# Patient Record
Sex: Male | Born: 1935 | Race: White | Hispanic: No | Marital: Married | Smoking: Former smoker
Health system: Southern US, Community
[De-identification: ages and names within clinical notes are randomized; demographics above are authoritative.]

## PROBLEM LIST (undated history)

## (undated) DIAGNOSIS — I219 Acute myocardial infarction, unspecified: Secondary | ICD-10-CM

## (undated) DIAGNOSIS — Z720 Tobacco use: Secondary | ICD-10-CM

## (undated) DIAGNOSIS — I739 Peripheral vascular disease, unspecified: Secondary | ICD-10-CM

## (undated) DIAGNOSIS — W19XXXA Unspecified fall, initial encounter: Secondary | ICD-10-CM

## (undated) DIAGNOSIS — D649 Anemia, unspecified: Secondary | ICD-10-CM

## (undated) DIAGNOSIS — N183 Chronic kidney disease, stage 3 unspecified: Secondary | ICD-10-CM

## (undated) DIAGNOSIS — I714 Abdominal aortic aneurysm, without rupture, unspecified: Secondary | ICD-10-CM

## (undated) DIAGNOSIS — Z950 Presence of cardiac pacemaker: Secondary | ICD-10-CM

## (undated) DIAGNOSIS — Z22322 Carrier or suspected carrier of Methicillin resistant Staphylococcus aureus: Secondary | ICD-10-CM

## (undated) DIAGNOSIS — G629 Polyneuropathy, unspecified: Secondary | ICD-10-CM

## (undated) DIAGNOSIS — I639 Cerebral infarction, unspecified: Secondary | ICD-10-CM

## (undated) DIAGNOSIS — J449 Chronic obstructive pulmonary disease, unspecified: Secondary | ICD-10-CM

## (undated) DIAGNOSIS — Z9889 Other specified postprocedural states: Secondary | ICD-10-CM

## (undated) DIAGNOSIS — Z951 Presence of aortocoronary bypass graft: Secondary | ICD-10-CM

## (undated) DIAGNOSIS — I6529 Occlusion and stenosis of unspecified carotid artery: Secondary | ICD-10-CM

## (undated) DIAGNOSIS — S72001A Fracture of unspecified part of neck of right femur, initial encounter for closed fracture: Secondary | ICD-10-CM

## (undated) DIAGNOSIS — I2510 Atherosclerotic heart disease of native coronary artery without angina pectoris: Secondary | ICD-10-CM

## (undated) DIAGNOSIS — Z8679 Personal history of other diseases of the circulatory system: Secondary | ICD-10-CM

## (undated) DIAGNOSIS — N189 Chronic kidney disease, unspecified: Secondary | ICD-10-CM

## (undated) DIAGNOSIS — I10 Essential (primary) hypertension: Secondary | ICD-10-CM

## (undated) DIAGNOSIS — E119 Type 2 diabetes mellitus without complications: Secondary | ICD-10-CM

## (undated) DIAGNOSIS — E785 Hyperlipidemia, unspecified: Secondary | ICD-10-CM

## (undated) DIAGNOSIS — I5042 Chronic combined systolic (congestive) and diastolic (congestive) heart failure: Secondary | ICD-10-CM

## (undated) DIAGNOSIS — I442 Atrioventricular block, complete: Secondary | ICD-10-CM

## (undated) DIAGNOSIS — R338 Other retention of urine: Secondary | ICD-10-CM

## (undated) DIAGNOSIS — Y92009 Unspecified place in unspecified non-institutional (private) residence as the place of occurrence of the external cause: Secondary | ICD-10-CM

## (undated) HISTORY — DX: Tobacco use: Z72.0

## (undated) HISTORY — DX: Chronic combined systolic (congestive) and diastolic (congestive) heart failure: I50.42

## (undated) HISTORY — DX: Unspecified fall, initial encounter: W19.XXXA

## (undated) HISTORY — DX: Acute myocardial infarction, unspecified: I21.9

## (undated) HISTORY — DX: Anemia, unspecified: D64.9

## (undated) HISTORY — DX: Polyneuropathy, unspecified: G62.9

## (undated) HISTORY — DX: Abdominal aortic aneurysm, without rupture: I71.4

## (undated) HISTORY — DX: Presence of aortocoronary bypass graft: Z95.1

## (undated) HISTORY — DX: Unspecified place in unspecified non-institutional (private) residence as the place of occurrence of the external cause: Y92.009

## (undated) HISTORY — DX: Personal history of other diseases of the circulatory system: Z86.79

## (undated) HISTORY — DX: Presence of cardiac pacemaker: Z95.0

## (undated) HISTORY — DX: Peripheral vascular disease, unspecified: I73.9

## (undated) HISTORY — PX: JOINT REPLACEMENT: SHX530

## (undated) HISTORY — DX: Cerebral infarction, unspecified: I63.9

## (undated) HISTORY — DX: Atherosclerotic heart disease of native coronary artery without angina pectoris: I25.10

## (undated) HISTORY — DX: Abdominal aortic aneurysm, without rupture, unspecified: I71.40

## (undated) HISTORY — DX: Essential (primary) hypertension: I10

## (undated) HISTORY — PX: INSERT / REPLACE / REMOVE PACEMAKER: SUR710

## (undated) HISTORY — DX: Atrioventricular block, complete: I44.2

## (undated) HISTORY — DX: Chronic kidney disease, unspecified: N18.9

## (undated) HISTORY — DX: Morbid (severe) obesity due to excess calories: E66.01

## (undated) HISTORY — DX: Hyperlipidemia, unspecified: E78.5

## (undated) HISTORY — DX: Occlusion and stenosis of unspecified carotid artery: I65.29

## (undated) HISTORY — DX: Other specified postprocedural states: Z98.890

---
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
## 1975-03-07 HISTORY — PX: LUMBAR DISC SURGERY: SHX700

## 2003-03-07 HISTORY — PX: CORONARY ARTERY BYPASS GRAFT: SHX141

## 2003-11-09 ENCOUNTER — Inpatient Hospital Stay (HOSPITAL_COMMUNITY): Admission: AD | Admit: 2003-11-09 | Discharge: 2003-11-18 | Payer: Self-pay | Admitting: Cardiology

## 2003-11-09 ENCOUNTER — Emergency Department (HOSPITAL_COMMUNITY): Admission: EM | Admit: 2003-11-09 | Discharge: 2003-11-09 | Payer: Self-pay | Admitting: Emergency Medicine

## 2003-12-14 ENCOUNTER — Encounter (HOSPITAL_COMMUNITY): Admission: RE | Admit: 2003-12-14 | Discharge: 2004-03-13 | Payer: Self-pay | Admitting: Cardiology

## 2004-02-11 ENCOUNTER — Ambulatory Visit: Payer: Self-pay | Admitting: Cardiology

## 2004-02-22 ENCOUNTER — Encounter: Admission: RE | Admit: 2004-02-22 | Discharge: 2004-03-22 | Payer: Self-pay | Admitting: Internal Medicine

## 2004-03-28 ENCOUNTER — Ambulatory Visit: Payer: Self-pay | Admitting: Cardiology

## 2004-04-14 ENCOUNTER — Ambulatory Visit: Payer: Self-pay

## 2004-04-25 ENCOUNTER — Ambulatory Visit: Payer: Self-pay | Admitting: Cardiology

## 2004-05-09 ENCOUNTER — Ambulatory Visit: Payer: Self-pay | Admitting: Cardiology

## 2004-05-11 ENCOUNTER — Ambulatory Visit: Payer: Self-pay | Admitting: Cardiology

## 2004-05-20 ENCOUNTER — Ambulatory Visit (HOSPITAL_COMMUNITY): Admission: RE | Admit: 2004-05-20 | Discharge: 2004-05-20 | Payer: Self-pay | Admitting: Cardiology

## 2004-05-24 ENCOUNTER — Ambulatory Visit: Payer: Self-pay | Admitting: Cardiology

## 2004-05-25 ENCOUNTER — Encounter: Admission: RE | Admit: 2004-05-25 | Discharge: 2004-05-25 | Payer: Self-pay | Admitting: Cardiology

## 2005-01-11 ENCOUNTER — Encounter: Admission: RE | Admit: 2005-01-11 | Discharge: 2005-01-11 | Payer: Self-pay | Admitting: Vascular Surgery

## 2005-06-08 ENCOUNTER — Ambulatory Visit: Payer: Self-pay | Admitting: Cardiology

## 2005-07-19 ENCOUNTER — Encounter: Admission: RE | Admit: 2005-07-19 | Discharge: 2005-07-19 | Payer: Self-pay | Admitting: Vascular Surgery

## 2006-08-15 ENCOUNTER — Ambulatory Visit: Payer: Self-pay | Admitting: Vascular Surgery

## 2006-10-04 ENCOUNTER — Encounter: Admission: RE | Admit: 2006-10-04 | Discharge: 2006-10-04 | Payer: Self-pay | Admitting: Internal Medicine

## 2006-12-20 ENCOUNTER — Ambulatory Visit: Payer: Self-pay | Admitting: Cardiology

## 2007-06-20 ENCOUNTER — Ambulatory Visit: Payer: Self-pay | Admitting: Cardiology

## 2007-09-03 ENCOUNTER — Ambulatory Visit: Payer: Self-pay | Admitting: Vascular Surgery

## 2008-03-03 ENCOUNTER — Ambulatory Visit: Payer: Self-pay | Admitting: Vascular Surgery

## 2008-07-27 ENCOUNTER — Ambulatory Visit: Payer: Self-pay | Admitting: Cardiology

## 2008-08-25 ENCOUNTER — Ambulatory Visit: Payer: Self-pay | Admitting: Vascular Surgery

## 2008-09-15 ENCOUNTER — Encounter: Payer: Self-pay | Admitting: Cardiology

## 2008-09-17 ENCOUNTER — Encounter: Admission: RE | Admit: 2008-09-17 | Discharge: 2008-09-17 | Payer: Self-pay | Admitting: Internal Medicine

## 2008-10-01 ENCOUNTER — Encounter: Admission: RE | Admit: 2008-10-01 | Discharge: 2008-10-01 | Payer: Self-pay | Admitting: Internal Medicine

## 2009-04-07 ENCOUNTER — Ambulatory Visit: Payer: Self-pay | Admitting: Vascular Surgery

## 2009-07-05 ENCOUNTER — Encounter: Admission: RE | Admit: 2009-07-05 | Discharge: 2009-07-05 | Payer: Self-pay | Admitting: Neurology

## 2009-07-06 ENCOUNTER — Encounter: Payer: Self-pay | Admitting: Cardiology

## 2009-07-07 ENCOUNTER — Ambulatory Visit: Payer: Self-pay | Admitting: Cardiology

## 2009-07-21 ENCOUNTER — Encounter: Admission: RE | Admit: 2009-07-21 | Discharge: 2009-09-20 | Payer: Self-pay | Admitting: Neurology

## 2009-07-22 ENCOUNTER — Encounter: Admission: RE | Admit: 2009-07-22 | Discharge: 2009-07-22 | Payer: Self-pay | Admitting: Neurology

## 2009-11-04 ENCOUNTER — Ambulatory Visit: Payer: Self-pay | Admitting: Vascular Surgery

## 2010-04-07 NOTE — Assessment & Plan Note (Signed)
Summary: f1y   Visit Type:  Follow-up Referring Provider:  Terrace Arabia   Neurology Primary Provider:  Pearson Grippe, MD  CC:  CAD.  History of Present Illness: The patient is seen for followup of coronary disease.  He has had some problems with his foot that is being evaluated.  He is not having any significant cardiac symptoms.  I saw him last in May, 2010.  He has no chest pain or shortness of breath.  He did undergo CABG in 2005.  Current Medications (verified): 1)  Aspirin 81 Mg Tbec (Aspirin) .... Take Two Tablets By Mouth Daily 2)  Lisinopril 10 Mg Tabs (Lisinopril) .... Take One Tablet By Mouth Daily 3)  Lipitor 40 Mg Tabs (Atorvastatin Calcium) .... Take One Tablet By Mouth Daily. 4)  Carvedilol 25 Mg Tabs (Carvedilol) .... Take One Tablet By Mouth Once Daily 5)  Actos 15 Mg Tabs (Pioglitazone Hcl) .Marland Kitchen.. 1 Tab Once Daily 6)  Detrol La 4 Mg Xr24h-Cap (Tolterodine Tartrate) .Marland Kitchen.. 1 Tab Every Other Day 7)  Chlorathalidone 25mg  .... 1 Tab Every Other Day 8)  Lyrica 75 Mg Caps (Pregabalin) .... Take 1 By Mouth Three Times A Day  Allergies (verified): No Known Drug Allergies  Past History:  Past Medical History: CAD CABG...2005 Normal LV function   at cath  ....no echo data as of 07/05/2009 Hypertension Dyslipidemia CVA..1997.Marland Kitchenaffected right side AAA and aneurysms of infrarenal Ao and iliac arteries....Marland Kitchenfollowed Dr. Cari Caraway Renal artery stenosis....noted at cath  2005  medical Rx Tobacco abuse  Review of Systems       The patient denies fever, chills, headache, sweats, rash, change in vision, change in hearing, chest pain, cough, nausea vomiting, urinary symptoms.  All other systems are reviewed and are negative.  Vital Signs:  Patient profile:   77 year old male Height:      70 inches Weight:      203.75 pounds BMI:     29.34 Pulse rate:   57 / minute BP sitting:   166 / 80  (right arm) Cuff size:   regular  Vitals Entered By: Stanton Kidney, EMT-P (Jul 07, 2009 10:35  AM)  Physical Exam  General:  patient is stable today.  He walks with a cane. Eyes:  no xanthelasma. Neck:  no jugular venous distention.   Lungs:  lungs are clear.  Respiratory effort is not labored. Heart:  cardiac exam reveals an S1 and S2.  There no clicks or significant murmurs. Abdomen:  abdomen is soft. Msk:  no musculoskeletal deformities. Extremities:  no peripheral edema. Skin:  no skin rashes. Psych:  patient is oriented to person time and place.  Affect is normal.   Impression & Recommendations:  Problem # 1:  * CVA..1997.Marland KitchenRIGHT SIDE AFFECTED The patient tells me that his carotid Dopplers are followed by his other physicians.  Problem # 2:  DYSLIPIDEMIA (ICD-272.4)  His updated medication list for this problem includes:    Lipitor 40 Mg Tabs (Atorvastatin calcium) .Marland Kitchen... Take one tablet by mouth daily. Lipids are treated.  Problem # 3:  ESSENTIAL HYPERTENSION, BENIGN (ICD-401.1)  His updated medication list for this problem includes:    Aspirin 81 Mg Tbec (Aspirin) .Marland Kitchen... Take two tablets by mouth daily    Lisinopril 10 Mg Tabs (Lisinopril) .Marland Kitchen... Take one tablet by mouth daily    Carvedilol 25 Mg Tabs (Carvedilol) .Marland Kitchen... Take one tablet by mouth once daily Systolic blood pressure is elevated today.  He only followup with his primary  physician.  Problem # 4:  ABDOMINAL AORTIC ANEURYSM (ICD-441.4) This problem is followed by Dr. Cari Caraway.  It has been stable.  Problem # 5:  CAD (ICD-414.00)  His updated medication list for this problem includes:    Aspirin 81 Mg Tbec (Aspirin) .Marland Kitchen... Take two tablets by mouth daily    Lisinopril 10 Mg Tabs (Lisinopril) .Marland Kitchen... Take one tablet by mouth daily    Carvedilol 25 Mg Tabs (Carvedilol) .Marland Kitchen... Take one tablet by mouth once daily Coronary disease is stable.  EKG is done today and reviewed by me.  There is no change.  I've chosen not to do any further workup at this time.  We'll see him back in one year.  Orders: EKG w/  Interpretation (93000)  Problem # 6:  TOBACCO ABUSE (ICD-305.1) Unfortunately the patient continues to smoke a small amount.  I have counseled him to stop.  Patient Instructions: 1)  Follow up in 1 year

## 2010-04-07 NOTE — Miscellaneous (Signed)
  Clinical Lists Changes  Observations: Added new observation of PAST MED HX: CAD CABG...2005 Normal LV function   at cath  ....no echo data as of 07/05/2009 Hypertension Dyslipidemia CVA..1997.Marland Kitchenaffected right side AAA and aneurysms of infrarenal Ao and iliac arteries....Marland Kitchenfollowed Dr. Cari Caraway Renal artery stenosis....noted at cath  2005  medical Rx (07/06/2009 12:08) Added new observation of PRIMARY MD: Pearson Grippe, MD (07/06/2009 12:08)       Past History:  Past Medical History: CAD CABG...2005 Normal LV function   at cath  ....no echo data as of 07/05/2009 Hypertension Dyslipidemia CVA..1997.Marland Kitchenaffected right side AAA and aneurysms of infrarenal Ao and iliac arteries....Marland Kitchenfollowed Dr. Cari Caraway Renal artery stenosis....noted at cath  2005  medical Rx

## 2010-07-02 ENCOUNTER — Encounter: Payer: Self-pay | Admitting: Cardiology

## 2010-07-05 ENCOUNTER — Encounter: Payer: Self-pay | Admitting: Cardiology

## 2010-07-05 DIAGNOSIS — I714 Abdominal aortic aneurysm, without rupture: Secondary | ICD-10-CM | POA: Insufficient documentation

## 2010-07-05 DIAGNOSIS — E785 Hyperlipidemia, unspecified: Secondary | ICD-10-CM | POA: Insufficient documentation

## 2010-07-05 DIAGNOSIS — R9430 Abnormal result of cardiovascular function study, unspecified: Secondary | ICD-10-CM | POA: Insufficient documentation

## 2010-07-05 DIAGNOSIS — Z951 Presence of aortocoronary bypass graft: Secondary | ICD-10-CM | POA: Insufficient documentation

## 2010-07-05 DIAGNOSIS — I701 Atherosclerosis of renal artery: Secondary | ICD-10-CM | POA: Insufficient documentation

## 2010-07-05 DIAGNOSIS — I10 Essential (primary) hypertension: Secondary | ICD-10-CM | POA: Insufficient documentation

## 2010-07-05 DIAGNOSIS — I639 Cerebral infarction, unspecified: Secondary | ICD-10-CM | POA: Insufficient documentation

## 2010-07-05 DIAGNOSIS — Z720 Tobacco use: Secondary | ICD-10-CM | POA: Insufficient documentation

## 2010-07-06 ENCOUNTER — Ambulatory Visit (INDEPENDENT_AMBULATORY_CARE_PROVIDER_SITE_OTHER): Payer: Medicare Other | Admitting: Cardiology

## 2010-07-06 ENCOUNTER — Ambulatory Visit: Payer: Self-pay | Admitting: Cardiology

## 2010-07-06 ENCOUNTER — Encounter: Payer: Self-pay | Admitting: Cardiology

## 2010-07-06 DIAGNOSIS — I714 Abdominal aortic aneurysm, without rupture, unspecified: Secondary | ICD-10-CM

## 2010-07-06 DIAGNOSIS — I701 Atherosclerosis of renal artery: Secondary | ICD-10-CM

## 2010-07-06 DIAGNOSIS — I2510 Atherosclerotic heart disease of native coronary artery without angina pectoris: Secondary | ICD-10-CM

## 2010-07-06 DIAGNOSIS — F17200 Nicotine dependence, unspecified, uncomplicated: Secondary | ICD-10-CM

## 2010-07-06 DIAGNOSIS — Z720 Tobacco use: Secondary | ICD-10-CM

## 2010-07-06 DIAGNOSIS — E785 Hyperlipidemia, unspecified: Secondary | ICD-10-CM

## 2010-07-06 DIAGNOSIS — G629 Polyneuropathy, unspecified: Secondary | ICD-10-CM

## 2010-07-06 DIAGNOSIS — G589 Mononeuropathy, unspecified: Secondary | ICD-10-CM

## 2010-07-06 NOTE — Assessment & Plan Note (Signed)
Patient continues to smoke one half pack per day.  I have counseled him about this.

## 2010-07-06 NOTE — Assessment & Plan Note (Signed)
Patient's blood pressure is under good control.  There is no need for further evaluation at this time.

## 2010-07-06 NOTE — Progress Notes (Signed)
HPI Patient is seen today for followup coronary artery disease.  I saw him last in May, 2011.  He's not had any chest pain.  He underwent CABG in 2005.  He had normal ventricular function at that time.  He has not had any cardiac testing since that time.  He has peripheral vascular disease.  He has aneurysms of the infrarenal aorta and iliac arteries.  This is followed by Dr. Cari Caraway.  The patient tells me that he has also had a carotid Doppler done through Dr. Adele Dan office.  Unfortunately he continues to smoke.  I have counseled him to stop. Not on File  Current Outpatient Prescriptions  Medication Sig Dispense Refill  . aspirin 81 MG tablet Take 162 mg by mouth daily.        Marland Kitchen atorvastatin (LIPITOR) 40 MG tablet Take 40 mg by mouth daily.        . carvedilol (COREG) 25 MG tablet Take 25 mg by mouth daily.        . chlorthalidone (HYGROTON) 25 MG tablet Take 25 mg by mouth every other day.        . lisinopril (PRINIVIL,ZESTRIL) 10 MG tablet Take 10 mg by mouth daily.        . pioglitazone (ACTOS) 15 MG tablet Take 15 mg by mouth daily.        Marland Kitchen tolterodine (DETROL LA) 4 MG 24 hr capsule Take 4 mg by mouth every other day.        Marland Kitchen DISCONTD: pregabalin (LYRICA) 75 MG capsule Take 75 mg by mouth 3 (three) times daily.          History   Social History  . Marital Status: Married    Spouse Name: N/A    Number of Children: N/A  . Years of Education: N/A   Occupational History  . retired    Social History Main Topics  . Smoking status: Current Everyday Smoker  . Smokeless tobacco: Not on file  . Alcohol Use: Yes     occasionally  . Drug Use: Not on file  . Sexually Active: Not on file   Other Topics Concern  . Not on file   Social History Narrative  . No narrative on file    No family history on file.  Past Medical History  Diagnosis Date  . HTN (hypertension)   . Dyslipidemia   . CVA (cerebral vascular accident)     1997, affected right side  . AAA (abdominal  aortic aneurysm)     Also aneurysms of the infrarenal aorta and iliac arteries.. followed Dr. Edilia Bo  . Renal artery stenosis     Noted, 2005, medical therapy  . Tobacco abuse   . CAD (coronary artery disease)   . Hx of CABG     2005  . Ejection fraction     Normal,, 2005,( no echo data as of May, 201 2)  . Neuropathy     Right foot    Past Surgical History  Procedure Date  . Coronary artery bypass graft 2005    ROS  Patient denies fever, chills, headache, sweats, rash, change in vision, change in hearing, chest pain, cough, nausea vomiting, urinary symptoms.  He has continued problems with the neuropathy in his right foot.  He is taking some over-the-counter medications for this.  He is no longer seeing neurology.  PHYSICAL EXAM Patient is oriented to person time and place.  Affect is normal.  Head is atraumatic. There is no  xanthelasma.  There are no carotid bruits.  There is no jugular venous distention.  Lungs are clear.  Respiratory effort is not labored.  Cardiac exam reveals an S1 and S2.  There are no significant murmurs.  The abdomen is soft.  There is no peripheral edema.  There are no skin rashes.  There no musculoskeletal deformities.  Filed Vitals:   07/06/10 0945  BP: 122/76  Pulse: 62  Height: 5\' 10"  (1.778 m)  Weight: 201 lb (91.173 kg)    EKG  EKG is done today and reviewed by me.  There is left axis.  There are no significant changes.  ASSESSMENT & PLAN

## 2010-07-06 NOTE — Assessment & Plan Note (Signed)
The patient is on medication for his lipids and followed by his primary team.  No further workup.

## 2010-07-06 NOTE — Patient Instructions (Signed)

## 2010-07-06 NOTE — Assessment & Plan Note (Signed)
Coronary disease is stable.  There is no EKG change.  He has no symptoms.  He has not had any studies since his CABG in 2005.  He does not need an exercise test.  I decided to proceed with a 2-D echo to reassess LV function.  This will be arranged and I'll be in touch with him.

## 2010-07-06 NOTE — Assessment & Plan Note (Signed)
The patient is bothered by neuropathy in his right foot.  This does not appear to be vascular in origin.  No further workup.

## 2010-07-06 NOTE — Assessment & Plan Note (Signed)
His peripheral vascular disease is being followed carefully by vascular surgery.  No further workup.

## 2010-07-18 ENCOUNTER — Other Ambulatory Visit (HOSPITAL_COMMUNITY): Payer: Federal, State, Local not specified - PPO | Admitting: Radiology

## 2010-07-19 NOTE — Procedures (Signed)
DUPLEX ULTRASOUND OF ABDOMINAL AORTA   INDICATION:  Follow up AAA.   HISTORY:  Diabetes:  No.  Cardiac:  Yes.  Hypertension:  Yes.  Smoking:  Yes.  Connective Tissue Disorder:  Family History:  No.  Previous Surgery:   DUPLEX EXAM:         AP (cm)                   TRANSVERSE (cm)  Proximal             2.32 cm                   2.72 cm  Mid                  3.05 cm                   3.35 cm  Distal               4.12 cm                   4.41 cm  Right Iliac          2.00 cm  Left Iliac           2.03 cm   PREVIOUS:  Date: 04/07/09  AP:  4.2  TRANSVERSE:  4.3   IMPRESSION:  1. No significant change in the maximum diameter measurements of the      distal aorta when compared with the previous examination.  2. There is a small increase in the maximum diameter of the mid      abdominal aorta as compared to the previous study.  3. Previous mid abdominal aorta 2.4 X 2.8 cm.   ___________________________________________  Di Kindle. Edilia Bo, M.D.   EM/MEDQ  D:  11/04/2009  T:  11/04/2009  Job:  [id number]

## 2010-07-19 NOTE — Procedures (Signed)
DUPLEX ULTRASOUND OF ABDOMINAL AORTA   INDICATION:  Abdominal aortic aneurysm.   HISTORY:  Diabetes:  no  Cardiac:  CABG  Hypertension:  yes  Smoking:  yes  Connective Tissue Disorder:  Family History:  no  Previous Surgery:  No   DUPLEX EXAM:         AP (cm)                   TRANSVERSE (cm)  Proximal             2.5 cm                    2.5 cm  Mid                  2.4 cm                    2.8 cm  Distal               4.2 cm                    4.3 cm  Right Iliac          1.8 cm                    1.8 cm  Left Iliac           1.8 cm                    1.6 cm   PREVIOUS:  Date: 08/25/2008  AP:  4.2  TRANSVERSE:  4.1   IMPRESSION:  No significant change in the maximum diameter measurements  of the distal abdominal aorta when compared to the previous examination.   ___________________________________________  Di Kindle. Edilia Bo, M.D.   CH/MEDQ  D:  04/07/2009  T:  04/08/2009  Job:  [id number]

## 2010-07-19 NOTE — Assessment & Plan Note (Signed)
OFFICE VISIT   [name] [name], [name] [name]  DOB:  08/01/1931                                       08/15/2006  [id number]#:[id number]   I saw the patient in the office today for continued followup of his  abdominal aortic aneurysm and also his peripheral vascular disease, I  last saw him in May of 2007.  Since that time the symptoms in his lower  extremities have remained stable, he experiences pain in both calves  associated with ambulation and relieved with rest.  He has had no rest  pain and no history of non-healing ulcers.  He has had no abdominal or  back pain.   SOCIAL HISTORY:  He does continue to smoke a pack per day of cigarettes.   REVIEW OF SYSTEMS:  He has recently had problems with the shingles.  He  denies any history of chest pain, chest pressure, or palpitations.  He  has had no bronchitis, asthma, or wheezing.   PHYSICAL EXAMINATION:  Blood pressure is 173/91, heart rate is 53.  I do  not detect any carotid bruits.  Lungs:  Clear bilaterally to  auscultation.  Cardiac:  He has a regular rate and rhythm.  Abdomen:  Soft and nontender.  His aneurysm is difficult to palpate.  He has  palpable femoral pulses.  I can not palpate pedal pulses.  He does have  warm, well perfused feet.   Doppler study in our office today shows an ABI of 73% on the right and  60% on the left.  These are relatively stable.  The maximum diameter of  his aneurysm is 4 sonometers and this has not changed significantly over  the last year.   He understands we would generally not consider elective repair of the  aneurysm unless it were 5-1/2 sonometers in maximum diameter.  I will  see him back in 1 year for a followup ultrasound.  With respect to his  peripheral vascular disease he understands the importance of tobacco  cessation.  I have encouraged him to stay as active as possible and we  will do followup ABIs in one year.   [name] [name]. Edilia Bo, M.D.  Electronically  Signed   CSD/MEDQ  D:  08/15/2006  T:  08/15/2006  Job:  [id number]   cc:   Salvadore Farber, MD

## 2010-07-19 NOTE — Assessment & Plan Note (Signed)
[facility]                            CARDIOLOGY OFFICE NOTE   DAVELLE, ANSELMI                      MRN:          [id number]  DATE:12/20/2006                            DOB:          [birth year]    [name] [name] is a very pleasant gentleman. He has known coronary disease.  He had been followed by Dr. Samule Ohm and Dr. Samule Ohm has moved to [facility]  and I am taking over his cardiology care. Mr. Hands has significant  claudication. He has aneurysmal disease of the infrarenal abdominal  aorta and the proximal ileac arteries. On this basis, he cannot be  stented. He is followed carefully by Dr. Cari Caraway. He does have  limiting claudication, but he is still able to play golf while using a  cart, although he has had to cut back to 9 holes at a time. He has an  abdominal aortic aneurysm at 4.1 sonometers. He is not having any chest  pain. He has good LV function.   PAST MEDICAL HISTORY:   ALLERGIES:  No known drug allergies.   MEDICATIONS:  1. Detrol LA.  2. Tylenol.  3. Aspirin.  4. Lisinopril.  5. Simvastatin.  6. Metoprolol.   OTHER MEDICAL PROBLEMS:  See the list below.   REVIEW OF SYSTEMS:  As outlined above. His claudication is the major  problem. He is not having any other significant problems and his review  of systems otherwise is negative.   PHYSICAL EXAMINATION:  VITAL SIGNS:  Weight 201 pounds. Blood pressure  134/90. Pulse is 57.  GENERAL: The patient is oriented to person, time, and place. Affect is  normal.  HEENT:  Reveals no xanthelasma. He has normal extraocular motion.  NECK:  There are no carotid bruits. There is no jugular venous  distension.  LUNGS:  Clear. Respiratory effort is not labored.  CARDIAC:  Reveals an S1 with an S2. There no clicks or significant  murmurs.  ABDOMEN:  His abdomen is soft. He has no masses or bruits.  EXTREMITIES:  He has no significant peripheral edema. When getting onto  the examining  table, he skinned the top of his foot slightly and it is  stable. I placed a Band-Aid on it.   PROBLEMS:  1. Coronary disease. He is not having any symptoms. He is post-CABG in      2005.  2. Normal LV function.  3. Significant peripheral vascular disease with abdominal aortic      aneurysm at 4.1 sonometers and aneurysms affecting the infrarenal      abdominal aorta and the proximal ileac arteries.  4. Hypertension, treated.  5. Dyslipidemia, treated.  6. History of a CVA in the past that affected his right side in 1997      and he is stable with this. He is on appropriate medications. We      would want his LDL in the range of 70.   I will see him back for cardiology follow up in six months.     Luis Abed, MD, [facility]  Electronically Signed    JDK/MedQ  DD: 12/20/2006  DT: 12/21/2006  Job #: [id number]   cc:   Massie Maroon, MD  Di Kindle Edilia Bo, M.D.

## 2010-07-19 NOTE — Assessment & Plan Note (Signed)
OFFICE VISIT   HEMI, CHACKO  DOB:  [birth year]                                       09/03/2007  AVWUJ#:[id number]   I saw the patient in the office today for continued followup of his  abdominal aortic aneurysm and peripheral vascular disease.  I last saw  him in June of 2008 at which time the maximum diameter of his aneurysm  was 4 cm.  This has remained relatively stable over the last year.  He  comes in for a 1-year followup visit.  Since I saw him last, he has had  no abdominal or back pain.  He continues to have claudication in both  lower extremities, mostly in the calves.  His symptoms have remained  relatively stable over the last 6 months.  He has had no rest pain and  no history of nonhealing ulcers.  He has had no abdominal or back pain.   SOCIAL HISTORY:  Unfortunately, he does continue to smoke cigarettes.   REVIEW OF SYSTEMS:  He has had no recent chest pain, chest pressure,  palpitations or arrhythmias.  PULMONARY:  He had no bronchitis, asthma or wheezing.  Review of systems is otherwise documented on the medical history form in  his chart, as are his medications.   PHYSICAL EXAMINATION:  This is a pleasant 77 year old gentleman who  appears his stated age.  His blood pressure is 150/83, heart rate is 50.  I do not detect any carotid bruits.  Lungs are clear bilaterally to  auscultation.  On cardiac exam, he has a regular rate and rhythm.  His  abdomen is soft and nontender.  His aneurysm is nontender.  He has  diminished femoral pulses.  I cannot palpate pedal pulses although both  feet appear adequately perfused without ischemic ulcers.   Ultrasound in our office today shows the maximum diameter of his  aneurysm is 4.3 cm.  Maximum diameter of the right iliac artery is 2.6  cm, and maximum diameter of the left iliac artery is 2.6 cm.  There has,  thus, been a slight increase in size of his abdominal aortic aneurysm.   Doppler  study in our office today shows monophasic Doppler signals in  both feet with an ABI of 75% on the right and 60% on the left.   Given that the aneurysm has enlarged slightly, I have recommend we do an  ultrasound again in 6 months.  If the aneurysm continues to enlarge he  will need a CAT scan to consider elective repair and further assess the  aneurysm.  He understands we generally would not consider elective  repair of the aneurysm unless it reached 5.5 cm in maximum diameter.  We  have again discussed the importance of tobacco cessation.  He  understands that this does increase the risk of aneurysm enlargement and  rupture.  We will plan on seeing him back in 6 months.  He knows to call  sooner if he has problems.   Di Kindle. Edilia Bo, M.D.  Electronically Signed   CSD/MEDQ  D:  09/03/2007  T:  09/04/2007  Job:  [id number]

## 2010-07-19 NOTE — Procedures (Signed)
DUPLEX ULTRASOUND OF ABDOMINAL AORTA   INDICATION:  Follow-up evaluation of known abdominal aortic aneurysm.   HISTORY:  Diabetes:  No.  Cardiac:  Coronary artery bypass graft in 2005.  Hypertension:  Yes.  Smoking:  Yes.  Connective Tissue  Disorder:  Family History:  Previous Surgery:   DUPLEX EXAM:         AP (cm)                   TRANSVERSE (cm)  Proximal             2.5 cm                    2.7 cm  Mid                  4.2 cm                    4.1 cm  Distal               2.5 cm                    2.4 cm  Right Iliac          1.7 cm                    2.2 cm  Left Iliac           2.5 cm                    2.3 cm   PREVIOUS:  Date:  AP:  4.3  TRANSVERSE:  4.1   IMPRESSION:  Abdominal aortic aneurysm measurements are stable compared  to the previous study.   ___________________________________________  [name] [name]. [name] [name], M.D.   MC/MEDQ  D:  08/25/2008  T:  08/25/2008  Job:  [id number]

## 2010-07-19 NOTE — Assessment & Plan Note (Signed)
[facility]                            CARDIOLOGY OFFICE NOTE   [name] [name], [name] [name]                      MRN:          [id number]  DATE:06/20/2007                            DOB:          [birth year]    [name] [name] is doing well.  I have taken over his cardiology care from  Dr. Samule Ohm.  The patient has claudication.  He has an abdominal aortic  aneurysm that is followed carefully by [name] [name].  He does have  coronary disease, he is post CABG in 2005.  He is not having any  significant chest pain, shortness of breath, syncope or presyncope.   PAST MEDICAL HISTORY:   ALLERGIES:  HE HAS NOT RESPONDED WELL TO LATEX, SHELLFISH AND DYE.   MEDICATIONS:  Aspirin, lisinopril, Tylenol, Lipitor and Coreg.   OTHER MEDICAL PROBLEMS:  See the list on my note of December 20, 2006.   REVIEW OF SYSTEMS:  He is really doing well.  Unfortunately, he  continues to smoke.  He says he has cut down.  He is not able to stop at  this point.  Of course, he is encouraged current to stop smoking.  Otherwise the review of systems is negative.   PHYSICAL EXAMINATION:  VITAL SIGNS:  Weight is 207 pounds, which is up 6  pounds.  He definitely needs to lose weight.  Blood pressure 150/80.  Recently, he said that Dr. Elmyra Ricks office it was not elevated.  His pulse  is 81.  GENERAL:  The patient is oriented to person, time and place.  Affect is normal.  HEENT:  Reveals no xanthelasma.  He has normal extraocular motion.  NECK:  There are no carotid bruits.  There is no jugulovenous  distention.  LUNGS:  Clear.  Respiratory effort is not labored.  CARDIAC:  Reveals S1-S2.  There are no clicks or significant murmurs.  ABDOMEN:  Soft.  EXTREMITIES:  He has no peripheral edema.   EKG reveals sinus rhythm was sinus bradycardia.   PROBLEMS:  Problems are listed completely on the note of December 20, 2006 and reviewed.  1. Coronary disease, stable.  2. Peripheral vascular  disease, following with [name] [name]   It is extremely important that he stop smoking.  He knows this.  I will  see him back in 1 year.     [name] Abed, MD, [facility]  Electronically Signed    JDK/MedQ  DD: 06/20/2007  DT: 06/20/2007  Job #: [id number]   cc:   Massie Maroon, MD  Di Kindle Edilia Bo, M.D.

## 2010-07-19 NOTE — Procedures (Signed)
DUPLEX ULTRASOUND OF ABDOMINAL AORTA   INDICATION:  Follow-up evaluation of known abdominal aortic aneurysm.   HISTORY:  Diabetes:  No.  Cardiac:  Coronary artery bypass graft in 2005.  Hypertension:  Yes.  Smoking:  Yes.  Connective Tissue Disorder:  Family History:  Previous Surgery:   DUPLEX EXAM:         AP (cm)                   TRANSVERSE (cm)  Proximal             2.5 cm                    2.9 cm  Mid                  4.3 cm                    4.1 cm  Distal               3.2 cm                    3.0 cm  Right Iliac          1.0 cm                    1.4 cm  Left Iliac           1.5 cm                    1.5 cm   PREVIOUS:  Date: 09/04/2007  AP:  4.3  TRANSVERSE:  4.3   IMPRESSION:  Abdominal aortic aneurysm measurements are stable compared  to previous study.   ___________________________________________  Di Kindle. Edilia Bo, M.D.   MC/MEDQ  D:  03/03/2008  T:  03/03/2008  Job:  [id number]

## 2010-07-22 ENCOUNTER — Encounter: Payer: Self-pay | Admitting: *Deleted

## 2010-07-22 NOTE — Op Note (Signed)
NAME:  [name] [name], [name] [name] NO.:  [id number]   MEDICAL RECORD NO.:  [id number]          PATIENT TYPE:  OIB   LOCATION:  2899                         FACILITY:  [facility]   PHYSICIAN:  Salvadore Farber, M.D. LHCDATE OF BIRTH:  Dec 06, 1931   DATE OF PROCEDURE:  05/20/2004  DATE OF DISCHARGE:                                 OPERATIVE REPORT   PROCEDURE:  Colon abdominal aortography with bilateral lower extremity  runoff to the feet, selective catheterization of the left external iliac  artery via contralateral common femoral arterial access.   INDICATION:  [name] [name] is a 77 year old gentleman with longstanding  claudication of his left leg which had been only mildly lifestyle-limiting.  However, over the past year, he has developed worsening claudication of his  right leg such that he currently has claudication in both calves at less  than 100 yards of walking on level ground.  He finds this quite disabling to  his lifestyle.  We have tried conservative management with exercise therapy  and Cilostazol.  These have not resulted in a satisfactory improvement in  his symptoms.  He is therefore brought to angiography with an I2  percutaneous revascularization.   PROCEDURAL TECHNIQUE:  Informed consent was obtained.  Under 1% lidocaine  local anesthesia and using a Smart needle, a 5 French sheath was placed in  the right common femoral artery.  Wholey wire was advanced into the  abdominal aorta.  Abdominal aortography was performed using a pigtail  catheter positioned in the suprarenal abdominal aorta.  Catheter was then  pulled back to the infrarenal abdominal aorta.  Abdominal aortography with  bilateral lower extremity runoff to the feet was performed.  This  demonstrated at least moderate stenosis in the right common and external  iliac artery as well as the left external iliac.  To further interrogate  this, I began with pressure assessment on the right.  The pigtail  catheter  was exchanged over a wire for a 4 French end-hole catheter.  This was  positioned in the infrarenal abdominal aorta.  Simultaneous pressures were  measured in the abdominal aorta and the distal right external iliac artery.  This demonstrated a resting peak gradient of 20 mmHg.   To assess the severity of the left external iliac stenosis, a LIMA catheter  was used to selectively engage the common iliac.  Wholey wire was advanced  into the proximal SFA.  Over this, the 4 French end-hole catheter was  advanced into the distal left external iliac.  Then 200 mcg of intraarterial  nitroglycerin was administered.  Pull-back was then performed across the  stenoses and back into the abdominal aorta.  This demonstrated a 25 mmHg  inducible gradient.   Images also demonstrated bilateral common iliac aneurysms proximal to the  stenotic regions.  These appeared to be substantial in size.  Procedure was  therefore completed to allow further assessment of the size of the aneurysms  by CT and subsequent planning for revascularization and repair of the  aneurysms if necessary.   The sheath was removed and manual compression applied.  He was then  transferred to the holding room in stable condition having tolerated the  procedure well.   COMPLICATIONS:  None.   FINDINGS:  1.  Abdominal aorta:  Diffuse disease of the abdominal aorta, both above and      below the takeoff of the renal arteries.  There is no evidence of      aneurysm and no significant stenosis.  2.  Renal arteries:  Single vessels bilaterally.  Both have mild ostial      stenoses.  3.  Right leg:  Moderately aneurysmal common iliac at its origin.  The      internal iliac is totally occluded.  The common iliac has a 70%      stenosis.  There is an 80% stenosis of the proximal external iliac.      Common femoral is without significant disease.  The profunda is widely      patent.  The SFA has scattered mild disease throughout  its course      without significant stenosis.  Similar mild plaquing in the popliteal.      The anterior tibial has a very high origin at the level of the knee.      The posterior tibial is occluded in its mid course and does not      collateralize distally.  There is thus two-vessel runoff to the foot via      the peroneal and anterior tibial.  4.  Left leg:  Markedly aneurysmal left common iliac artery.  The internal      iliac has an ostial 90% stenosis.  There is a 60-70% stenosis of the      proximal external iliac.  The common femoral and profunda are without      significant disease.  The SFA is occluded from its mid section with      reconstitution in the popliteal just above the knee.  There are robust      collaterals to the popliteal.  The posterior tibial is occluded      proximally.  The anterior tibial is occluded distally.  There is single-      vessel runoff to the foot via the peroneal.   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  Management depends on the  size of the aneurysms.  We will thus assess this with CT angiogram to be  performed next week.  After this, we will aim at revascularization of the  iliacs.  It is my hope that with relief of the iliac stenoses, he will be  rendered minimally symptomatic despite continued occlusion at the  superficial femoral artery which could then be managed conservatively.      WED/MEDQ  D:  05/20/2004  T:  05/21/2004  Job:  [id number]

## 2010-07-22 NOTE — Procedures (Signed)
DUPLEX ULTRASOUND OF ABDOMINAL AORTA   INDICATION:  Followup, known abdominal aortic aneurysm.   HISTORY:  Diabetes:  No.  Cardiac:  CABG in 2005.  Hypertension:  Yes.  Smoking:  Yes.  Connective Tissue Disorder:  Family History:  Previous Surgery:   DUPLEX EXAM:         AP (cm)                   TRANSVERSE (cm)  Proximal             2.51 cm                   2.46 cm  Mid                  4.30 cm                   4.30 cm  Distal               4.14 cm                   4.45 cm  Right Iliac          2.58 cm                   2.31 cm  Left Iliac           2.60 cm                   2.62 cm   PREVIOUS:  Date:  AP:  3.68  TRANSVERSE:  3.99   IMPRESSION:  1. Abdominal aortic aneurysm noted with the largest measurement of      4.30 X 4.30.  2. Right common iliac aneurysm noted, measuring 2.58 x 2.31.  3. Left common iliac artery aneurysm noted, measuring 2.60 x 2.62.  4. Slightly increasing size compared to previous measurement.   ___________________________________________  Di Kindle. Edilia Bo, M.D.   MG/MEDQ  D:  09/03/2007  T:  09/03/2007  Job:  [id number]

## 2010-07-22 NOTE — H&P (Signed)
NAME:  [name] [name], [name] [name] NO.:  [id number]   MEDICAL RECORD NO.:  [id number]                   PATIENT TYPE:  INP   LOCATION:  2313                                 FACILITY:  [facility]   PHYSICIAN:  Jonelle Sidle, M.D. [facility]        DATE OF BIRTH:  Jul 15, 1931   DATE OF ADMISSION:  11/09/2003  DATE OF DISCHARGE:                                HISTORY & PHYSICAL   PRIMARY CARE PHYSICIAN:  Dr. Lendell Caprice.   CHIEF COMPLAINT:  Chest pain.   HISTORY OF PRESENT ILLNESS:  [name] [name] is a pleasant 77 year old male with  a reported history of peripheral arterial disease, managed medically,  ongoing tobacco use, dyslipidemia, and stroke approximately eight years ago.  He presented to the [facility] today  following an episode of chest discomfort.  He states that he initially had  chest pressure while playing golf last Tuesday, although it lasted only  approximately 10 minutes.  On Saturday, this past weekend, he woke up at  night with recurrent chest pressure that lasted approximately two hours and  ultimately resolved without specific intervention other than aspirin.  He  was apparently seen at Urgent Care at some point over the last few days and  actually was scheduled to see Dr. Lendell Caprice tomorrow.  He, however, went out  to play golf today and got through 12 holes, although had significant chest  pressure with exertion which became more severe, and he ultimately presented  for further assessment.  He did take an aspirin, and by the time he reached  the [facility] Emergency Department he was pain-free.  His  electrocardiogram shows evidence of an anterior wall infarct with some  residual ST elevation in leads V1 through V3, and P-wave inversions, also  associated with decreased R-wave progression and Q-waves in V1 and V2.  His  cardiac markers are abnormal with a CK-MB of 17, and a troponin-I of 0.52.  He was transferred over to the  intensive care unit at [facility]  and is being managed with heparin, Integrilin, aspirin, and nitroglycerin.  He has remained chest pain free since his presentation, and is  hemodynamically stable.   ALLERGIES:  No known drug allergies.  He also denies contrast dye allergy  and iodine allergy.   MEDICATIONS AT HOME:  1.  _____________ 100 mg p.o. daily.  2.  Zocor 40 mg p.o. q.h.s.  3.  Detrol LA 4 mg p.o. daily.  4.  Aspirin 325 mg p.o. daily.  5.  Glucosamine/chondroitin.  6.  At present, he is also on Lopressor 25 mg p.o. b.i.d., heparin and      Integrilin per pharmacy, and intravenous nitroglycerin.   PAST MEDICAL HISTORY:  1.  Dyslipidemia, on Zocor long-term.  He reports reasonable cholesterol      control, although it does not sound like this has been checked recently.      His last physical  was approximately one year ago.  2.  Reported stroke in 1998.  He still has some mild residual right-sided      weakness, but is able to function and plays golf regularly.  3.  History of peripheral arterial disease involving the right lower      extremity.  Apparently, he has some pain in his legs at times and has      seen Dr. Edilia Bo in the past.  He has not undergone any      revascularization procedures and is being managed medically at this      time.  4.  History of back surgery approximately 30 years ago.   SOCIAL HISTORY:  The patient is retired and lives at [facility] with his  wife here in [place].  He drinks alcohol occasionally, and has a 50 pack  year history of tobacco use which is ongoing.   FAMILY HISTORY:  Noncontributory for premature cardiovascular disease.   REVIEW OF SYSTEMS:  As described in present illness.  He states that he has  trouble sleeping in the evenings.  He has some mild stable shortness of  breath with activity.  He has occasional cough and wheezing.  Otherwise,  systems negative.   PHYSICAL EXAMINATION:  VITAL SIGNS:   Temperature is 97.1 degrees, heart rate  is 81, respirations 18, blood pressure is 140/67, oxygen saturation is 98%  on room air.  GENERAL:  This is an elderly well-developed male in no acute distress.  HEENT:  Conjunctivae are normal.  Oropharynx is clear.  NECK:  Supple without elevated jugular venous pressure, carotid bruits, or  thyromegaly.  LUNGS:  Clear with diminished breath sounds, but no active wheezing and non-  labored breathing at rest.  CARDIAC:  Regular rate and rhythm with a very soft S4, but no S3, gallop, or  loud murmur.  No pericardial rub is noted.  ABDOMEN:  Soft with normoactive bowel sounds and no tenderness to palpation.  EXTREMITIES:  No pitting edema.  There are bilateral femoral bruits, right  greater than left.  Peripheral pulses are 1+.  SKIN:  No ulcerative changes are noted.  MUSCULOSKELETAL:  No kyphosis noted.  NEUROLOGIC:  Alert and oriented x3.   LABORATORY DATA:  Chest x-ray is pending at this time.   Additional laboratory data:  WBC is 13.8, hemoglobin 16.3, platelets 318.  Sodium 136, potassium 4.1, BUN 14, creatinine 1.1.  INR 0.8.   IMPRESSION:  1.  Acute coronary syndrome with evidence of anterior injury.  Initial      troponin-I is 0.52, with a CK-MB of 17.  The patient is pain-free at      this point on aspirin, Integrilin, heparin, and nitroglycerin.  He is      hemodynamically stable.  2.  Ongoing tobacco use.  3.  Peripheral arterial disease, managed medically with femoral bruits.  4.  Dyslipidemia, on Zocor, status uncertain at this time.  5.  Hypertension at presentation.  6.  History of cerebrovascular disease, status post stroke in 1998.   PLAN:  1.  The patient has been admitted to the intensive care unit.  We will plan      to continue aspirin, Lopressor, statin therapy, heparin, Integrilin, and      nitroglycerin.  Repeat electrocardiogram shows similar anterior changes     in the absence of symptoms.  2.  Anticipate  diagnostic coronary angiography with an eye towards      percutaneous intervention as first case  in the morning assuming the      patient remains symptom-free and stable.  I reviewed the case with Dr.      Eden Emms who is on call tonight in case the situation changes.  I      discussed the procedure with the patient and his wife, and they are in      agreement to proceed.  3.  Will not institute Plavix at this point until coronary anatomy is      defined.  4.  Follow up fasting lipid profile.  5.  Smoking cessation will be critical.  6.  Further plans to follow.                                                Jonelle Sidle, M.D. [facility]    SGM/MEDQ  D:  11/09/2003  T:  11/09/2003  Job:  [id number]

## 2010-07-22 NOTE — Cardiovascular Report (Signed)
NAME:  [name] [name], [name] [name] NO.:  [id number]   MEDICAL RECORD NO.:  [id number]                   PATIENT TYPE:  INP   LOCATION:  2313                                 FACILITY:  [facility]   PHYSICIAN:  Salvadore Farber, M.D. [facility]         DATE OF BIRTH:  [birth year]   DATE OF PROCEDURE:  11/10/2003  DATE OF DISCHARGE:                              CARDIAC CATHETERIZATION   PROCEDURE:  Left heart catheterization, left ventriculography, coronary  angiography.   INDICATION:  [name] is a 77 year old gentleman who presented yesterday  evening with six days of intermittent chest discomfort occurring both with  at rest and clearly exacerbated by exertion.  Electrocardiogram demonstrated  anterior ST elevations of approximately 1 mm.  Since he was pain free, he  was treated medically initially and referred for diagnostic angiography this  morning.  He is ruled in for a myocardial infarction with CK-MB rising to 62  with total CK of 434.   PROCEDURAL TECHNIQUE:  Informed consent was obtained.  Under 1% lidocaine  local anesthesia, a 6 French sheath was placed in the left common femoral  artery.  Diagnostic angiography was performed using JL-4, JR-4, and pigtail  catheters.  The pigtail catheter was then pulled back to the suprarenal  abdominal aorta.  Abdominal aortography was performed by power injection.  The patient tolerated the procedure well and was transferred to the holding  room in stable condition.  Sheaths will be removed there.   COMPLICATIONS:  None.   FINDINGS:  1.  LV:  120/7/20. EF 67% with apical dyskinesis.  2.  No aortic stenosis or mitral regurgitation.  3.  Left main:  Angiographically normal.  4.  LAD:  Large vessel giving rise to a very large septal perforator which      parallels the LAD intramyocardially.  The LAD gives rise to three      relatively small diagonal branches.  The proximal LAD has an 80%      stenosis overlying the takeoff  of the large septal perforator.  This      perforator has an ostial 70% stenosis.  There is then a 95% stenosis of      the mid LAD followed by an 80% stenosis of the more distal portion of      the mid vessel.  There is TIMI-3 flow to the distal vasculature.  5.  Circumflex:  Moderate size vessel giving rise to a large obtuse      marginal.  The first marginal has a proximal 70% stenosis.  The AV      groove circumflex has a 60% stenosis just after the take off of the      marginal.  6.  RCA:  Large, dominant vessel.  It is occluded proximally. The PLV and      PDA are both collateralized well from the left.   ABDOMINAL AORTOGRAPHY:  Diffusely diseased abdominal aorta without  significant stenosis.  Mild-to-moderate plaquing throughout.  There are  single renal arteries bilaterally.  The right appears normal.  The left has  a 70-80% proximal stenosis.   IMPRESSION/RECOMMENDATIONS:  1.  Severe multivessel coronary disease with preserved left ventricular      systolic function.  Due to the complexity of his LAD disease, I      recommend coronary artery bypass grafting.  Will resume heparin after      sheath removal      and continue eptifibatide up to the time of surgery.  2.  Renal artery stenosis, unilateral with blood pressure controlled on      medical therapy.  Will manage conservatively initially.                                               Salvadore Farber, M.D. [facility]    WED/MEDQ  D:  11/10/2003  T:  11/10/2003  Job:  [id number]   cc:   Janae Bridgeman. Eloise Harman., M.D.  [address]  [place]  [place] [address]  Fax: [phone]

## 2010-07-22 NOTE — Op Note (Signed)
NAME:  [name] [name], LUPPINO                         ACCOUNT NO.:  [id number]   MEDICAL RECORD NO.:  [id number]                   PATIENT TYPE:  INP   LOCATION:  2313                                 FACILITY:  [facility]   PHYSICIAN:  Salvatore Decent. Dorris Fetch, M.D.         DATE OF BIRTH:  07-26-1931   DATE OF PROCEDURE:  11/13/2003  DATE OF DISCHARGE:                                 OPERATIVE REPORT   PREOPERATIVE DIAGNOSIS:  Severe three vessel coronary disease status post  myocardial infarction.   POSTOPERATIVE DIAGNOSIS:  Severe three vessel coronary disease status post  myocardial infarction.   PROCEDURE:  Median sternotomy, extracorporeal circulation, coronary artery  bypass grafting x 4 (left internal mammary artery to central portion of left  anterior descending, saphenous vein graft to anterior segment of left  anterior descending, saphenous vein graft to first obtuse marginal,  saphenous vein graft to distal right coronary), endoscopic vein harvest from  left leg.   SURGEON:  Salvatore Decent. Dorris Fetch, M.D.   ASSISTANT:  Toribio Harbour, N.P.   ANESTHESIA:  General.   FINDINGS:  Bifid LAD system with a septal branch and anterior wall branch,  the septal branch was the better quality of the two vessels, therefore,  mammary was used for that graft.  The vessels did not line up for sequential  mammary grafting.  Distal right fair target.  OM good target.  Good quality  conduits.  Anterior wall and apex with edema secondary to recent infarction.   CLINICAL NOTE:  [name] is a 77 year old gentleman with a history of  hyperlipidemia, stroke, ongoing tobacco abuse, and peripheral vascular  disease.  He presented on September 5 with chest pain.  He had had waxing  and waning chest pain for approximately six days prior to admission.  He did  rule in with a peak CK MB of 62.  He underwent cardiac catheterization on  September 6 which showed apical dyskinesis, but otherwise had good  ventricular function.  He had severe three vessel disease not amenable to  percutaneous intervention.  The patient was referred for coronary artery  bypass grafting.  The indications, risks, benefits, and alternative were  discussed in detail with the patient.  He understood and accepted the risks  and agreed to proceed.   OPERATIVE NOTE:  [name] was brought to the preop holding area on  November 13, 2003.  Lines were placed to monitor arterial, central venous  and pulmonary arterial pressure.  Intravenous antibiotics were administered.  He was taken to the operating room, anesthetized, and intubated.  A Foley  catheter was placed.  The chest, abdomen, and legs were prepped and draped  in the usual fashion.  An incision was made in the medial aspect of the left  leg at the level of the knee.  The greater saphenous vein was identified and  harvested endoscopically from the left thigh.  A small portion of the vein  was harvested with an open incision from the upper calf.  The vein was of  good quality.   Simultaneously, a median sternotomy was performed and the left internal  mammary artery was harvested using standard technique.  The mammary artery  was a good quality conduit.  The patient was fully heparinized prior to  dividing the distal end of the mammary artery, there was excellent flow  through the cut end of the vessel.  The pericardium was opened, the  ascending aorta was inspected and palpated, there was no palpable  atherosclerotic disease.  The aorta was cannulated via concentric 2-0  Ethibond pledgeted purse-string sutures.  A dual stage venous cannula was  placed through a purse-string suture in the right atrial appendage.  Cardiopulmonary bypass was instituted and the patient was cooled to 32  degrees Celsius.  The coronary arteries were inspected and anastomotic sites  were chosen.  Of note, the patient had a bifid LAD system with an anterior  branch which was  diffusely diseased.  There also was a parallel septal  branch that gave off multiple septal perforators and this vessel was of  better quality.  Therefore, the mammary was used for it.  This vessel was  intramyocardial.  The conduits were inspected and cut to length.  A foam pad  was placed in the pericardium to protect the left phrenic nerve.  A  temperature probe was placed in the myocardial septum and a cardioplegic  cannula was placed in the ascending aorta.  The aorta was crossclamped, the  left ventricle was emptied via the aortic root vent, and cardiac arrest was  achieved with a combination of cold antegrade cardioplegia and topical iced  saline.  After achieving a complete diastolic arrest and myocardial surface  temperature of 10 degrees Celsius with 1500 mL cardioplegia, the following  distal anastomoses were performed:   First, a reversed saphenous vein graft was placed end-to-side to the  anterior wall branch or portion of the LAD.  This was a 1.5 mm vessel that  had significant plaque just prior to the anastomosis and a 1.5 mm probe did  pass to the apex.  The anastomosis was performed with a running 7-0 Prolene  suture.  There was excellent flow through the graft, cardioplegia was  administered, and there was good hemostasis.   Next, a reversed saphenous vein graft was placed end-to-side to the first  obtuse marginal branch of the left circumflex.  This was a 2 mm good quality  target.  The vein was of good quality.  The anastomosis was performed with a  running 7-0 Prolene suture.  There was excellent flow through the graft,  cardioplegia was administered, and there was good hemostasis at the  anastomosis.   Next, a reversed saphenous vein graft was placed end-to-side to the distal  right coronary.  This was a fair quality vessel that did have a thick wall but no hemodynamically significant stenoses and a probe passed easily into  the posterior descending origin.  It was  a 1.5 mm lumen.  The vein graft was  anastomosed end-to-side with a running 7-0 Prolene suture.  Cardioplegia was  administered.  There was good flow and good hemostasis.   Next, the left internal mammary artery was brought through a window in the  pericardium, the distal end was spatulated, it was a 2 mm good quality  conduit.  It was anastomosed end-to-side to the septal portion of the LAD.  This was an intramyocardial  vessel, it was 1.8 mm in diameter and of good  quality.  The anastomosis was performed with a running 8-0 Prolene suture.  At the completion of the mammary to the LAD anastomosis, the bulldog clamp  was briefly removed to inspect for hemostasis.  Immediate rapid septal  rewarming was noted, the bulldog clamp was replaced.  The mammary pedicle  was tacked to the epicardial surface of the heart with 6-0 Prolene sutures.   Additional cardioplegia was administered via the vein grafts and the aortic  root.  The vein grafts were cut to length.  The cardioplegia cannula was  removed from the ascending aorta and the proximal vein graft anastomoses  were performed to 4.4 mm punch aortotomies while under Cross clamp.  At the  completion of the final proximal anastomosis, the patient was placed in  steep Trendelenburg position.  The bulldog clamp was again removed from the  mammary artery.  The heart and aortic root were deaired and the aortic Cross  clamp was removed.  The total crossclamp time was 90 minutes.  The patient  was being rewarmed during the proximal anastomoses.   After removing the Cross clamp, the patient was in heart block initially and  then went to slow escape rhythm.  All proximal and distal anastomoses were  inspected for hemostasis.  Epicardial pacing wires were placed on the right  ventricle and right atrium and DDD pacing was initiated.  When the patient  had been warmed to a core temperature of 37 degrees Celsius, he was weaned  from cardiopulmonary bypass  on the first attempt without difficulty.  Total  bypass time was 144 minutes.  The initial cardiac index was low, but with  volume administration and administration of low dose Dopamine drip, the  cardiac index was greater than 2 throughout the remainder of the procedure.  A test dose of protamine was administered and was well tolerated.  The  atrial and aortic cannulae were removed, the remainder of the protamine was  administered without incident.  The chest was irrigated with 1 liter of warm  normal saline containing 1 gram vancomycin.  Hemostasis was achieved.  Left  pleural and two mediastinal chest tubes were placed through separate  subcostal incisions.  The pericardium was not closed.  The sternum was  closed with heavy gauge interrupted stainless steel wires.  The remainder of  the incision was closed in standard fashion.  Subcuticular closure was used for the skin.  All sponge, instrument, and needle counts were correct at the  end of the procedure.  The patient was taken from the operating room to the  surgical intensive care unit intubated and in critical but stable condition.                                               Salvatore Decent Dorris Fetch, M.D.    SCH/MEDQ  D:  11/13/2003  T:  11/13/2003  Job:  [id number]   cc:   Jonelle Sidle, M.D. [facility]   Corinna L. Lendell Caprice, MD

## 2010-07-22 NOTE — Discharge Summary (Signed)
NAME:  [name] [name], [name] [name] NO.:  [id number]   MEDICAL RECORD NO.:  [id number]                   PATIENT TYPE:  INP   LOCATION:  2024                                 FACILITY:  [facility]   PHYSICIAN:  Salvatore Decent. Dorris Fetch, M.D.         DATE OF BIRTH:  Sep 14, 1931   DATE OF ADMISSION:  11/09/2003  DATE OF DISCHARGE:  11/18/2003                                 DISCHARGE SUMMARY   ADMITTING DIAGNOSES:  Acute coronary syndrome.   PAST MEDICAL HISTORY:  1.  Dyslipidemia.  2.  Cerebrovascular accident 54.  Continues to have mild residual right-      sided weakness.  This does not affect his physical functioning as he      plays golf regularly.  3.  Peripheral vascular occlusive disease involving the right lower      extremity managed medically at this time.   PAST SURGICAL HISTORY:  Back surgery approximately 30 years ago.   ALLERGIES:  This patient has no known drug allergies.   DISCHARGE DIAGNOSES:  Three vessel coronary artery disease with unstable  angina status post coronary artery bypass grafting.   BRIEF HISTORY:  [name] [name] is a 77 year old Caucasian man.  He presented to  the [facility] Emergency Department on September 5 following an episode of  chest discomfort.  This occurred while he was on the golf course.  He was  able to play through 12 holes, although he had significant chest pressure  during this time.  He took an aspirin and drove himself to [facility]  Emergency Department.  There he was evaluated by Dr. Nona Dell.  His  impression was that of acute coronary syndrome with evidence of anterior  injury.  His initial troponin I is 0.52 with CK-MB of 17.  His plan is for  admission to the hospital to the intensive care unit and cardiac  catheterization in the next 24 hours.   HOSPITAL COURSE:  On November 09, 2003 [name] [name] was admitted to [facility] in the care of Dr. Nona Dell.  On September 6 he underwent  cardiac  catheterization by Dr. Randa Evens.  This revealed significant  three vessel coronary artery disease with preserved left ventricular  function.  Ejection fraction is estimated to be 67%.  As his lesions were  not amenable to percutaneous intervention.  Cardiac surgery consultation was  requested.  He was evaluated later in the day by Dr. Charlett Lango.  After examination of the patient and review of the available records, Dr.  Dorris Fetch agreed that proceeding with coronary artery bypass grafting was  the preferred treatment choice for this gentleman.  The procedure, risks,  benefits were all discussed with [name] [name].  He agreed to proceed with  surgery.   Preoperative arterial evaluation included carotid duplex scan which revealed  mild to moderate bilateral internal carotid artery stenoses.  Because of his  history of CVA, Dr.  Hendrickson asked Dr. Hart Rochester of [facility] vascular surgery to  evaluate [name] [name].  Dr. Hart Rochester did not feel treatment of his carotid  disease is indicated at this time.  Plan is for follow-up with Dr. Edilia Bo  at the [facility] office in 9-12 months with a carotid duplex scan.   November 13, 2003 [name] [name] underwent the following surgical procedure:  Coronary artery bypass grafting x5.  Grafts placed at time of procedure:  Left internal mammary artery was grafted to the septal branch of the left  anterior descending artery.  Saphenous vein was grafted to the anterior  branch of the left anterior descending artery.  Saphenous vein was grafted  to the diagonal artery.  Saphenous vein was grafted to the obtuse marginal  artery.  Saphenous vein was grafted to the posterior descending artery.  Vein was harvested from the left thigh via endovein harvesting technique.  He tolerated this procedure well, transferring in stable condition to the  SICU.  He remained hemodynamically stable in the immediate postoperative  period.  He was extubated several hours after arrival  in intensive care  unit.  He woke from anesthesia neurologically intact.  He required only  routine care in the intensive care unit and was ready for transfer to unit  2000 on postoperative day one.   Overall, [name] [name] postoperative course has been uneventful.  He has had  some mild upper airway wheezing.  This has improved very much in the last 24  hours with aggressive pulmonary toilet and p.r.n. albuterol treatments.  He  has also been a little slow with his ambulation.  For this reason physical  therapy has been consulted.  They recommend for him use a rolling walker for  assistance while he regains his strength.   Today, September 13, postoperative day four, [name] [name] continues to  progress in recovering from his surgery.  His vital signs remain stable with  blood pressure 130/77.  He is afebrile.  His room air saturation is 100%.  His heart is maintaining normal sinus rhythm, 94 beats per minute.  This  morning's chest x-ray revealed small bilateral effusions.  [name] [name]  reports that his breathing feels much better today.  Does have slightly  decreased breath sounds on the right.  He is tolerating his diet.  Bowel and  bladder functions are within normal limits for him.  His incisions are all  healing well.  He has no lower extremity edema.  He has been ambulating  today with cardiac rehabilitation.  Increased his distance to 330 feet.  His  pain is adequately controlled.  If [name] [name] continues to progress in this  manner it is anticipated he will be ready for discharge in the next 24-48  hours.   CONDITION ON DISCHARGE:  Improved.   DISCHARGE INSTRUCTIONS:   MEDICATIONS:  1.  Enteric-coated aspirin 325 mg daily.  2.  Lopressor 50 mg b.i.d.  3.  Zocor 40 mg daily.  4.  Pletal 100 mg daily.  5.  Detrol LA 4 mg daily.  6.  Lasix 40 mg daily for the next five days.  7.  Potassium chloride 20 mEq daily for the next five days. 8.  For pain management he may have Tylox  one to two p.o. q.4h. p.r.n. for      moderate to severe pain or Tylenol 325 mg one to two p.o. q.4h. for mild      pain.   ACTIVITY:  He has been asked to  refrain from driving or heavy lifting,  pushing, or pulling.  Also instructed to continue his breathing exercises  and daily walking.   DIET:  Should continue to be a low fat, low salt diet.   WOUND CARE:  He may shower with mild soap and water.  If his incisions show  any sign of infection he is to call Dr. Sunday Corn office.   FOLLOWUP:  Home health services will be arranged for routine cardiac surgery  protocol.  He will have an appointment with his [place] cardiologist in  approximately two weeks after discharge.  An appointment will be  arranged prior to discharge.  He will have a chest x-ray taken that day.  He  has an appointment to see Dr. Dorris Fetch back at the [facility] office on Monday,  October 3 at 2:15 in the afternoon.  He will be asked to bring his chest x-  ray from [facility] for Dr. Dorris Fetch to review.      Toribio Harbour, N.P.                  Salvatore Decent Dorris Fetch, M.D.    CTK/MEDQ  D:  11/17/2003  T:  11/17/2003  Job:  [id number]   cc:   Salvadore Farber, M.D. [facility]  [address]  [address]  [place]  [place] [address]   Janae Bridgeman. Eloise Harman., M.D.  [address]  [place]  [place] [address]  Fax: [phone]

## 2010-08-11 ENCOUNTER — Ambulatory Visit (HOSPITAL_COMMUNITY): Payer: Medicare Other | Attending: Cardiology | Admitting: Radiology

## 2010-08-11 DIAGNOSIS — I2510 Atherosclerotic heart disease of native coronary artery without angina pectoris: Secondary | ICD-10-CM

## 2010-08-11 DIAGNOSIS — F17200 Nicotine dependence, unspecified, uncomplicated: Secondary | ICD-10-CM | POA: Insufficient documentation

## 2010-08-11 DIAGNOSIS — E785 Hyperlipidemia, unspecified: Secondary | ICD-10-CM | POA: Insufficient documentation

## 2010-08-11 DIAGNOSIS — I059 Rheumatic mitral valve disease, unspecified: Secondary | ICD-10-CM | POA: Insufficient documentation

## 2010-08-11 DIAGNOSIS — I10 Essential (primary) hypertension: Secondary | ICD-10-CM | POA: Insufficient documentation

## 2010-11-04 ENCOUNTER — Encounter: Payer: Self-pay | Admitting: Vascular Surgery

## 2010-12-08 ENCOUNTER — Other Ambulatory Visit: Payer: Self-pay | Admitting: Lab

## 2010-12-08 DIAGNOSIS — I714 Abdominal aortic aneurysm, without rupture: Secondary | ICD-10-CM

## 2010-12-13 ENCOUNTER — Encounter: Payer: Self-pay | Admitting: Vascular Surgery

## 2010-12-14 ENCOUNTER — Encounter (INDEPENDENT_AMBULATORY_CARE_PROVIDER_SITE_OTHER): Payer: Medicare Other | Admitting: Vascular Surgery

## 2010-12-14 ENCOUNTER — Ambulatory Visit: Payer: Medicare Other | Admitting: Vascular Surgery

## 2010-12-14 DIAGNOSIS — I745 Embolism and thrombosis of iliac artery: Secondary | ICD-10-CM

## 2010-12-14 DIAGNOSIS — I723 Aneurysm of iliac artery: Secondary | ICD-10-CM

## 2010-12-14 DIAGNOSIS — I714 Abdominal aortic aneurysm, without rupture: Secondary | ICD-10-CM

## 2010-12-14 NOTE — Progress Notes (Unsigned)
AAA study performed on 12/14/2010.

## 2010-12-21 NOTE — Procedures (Unsigned)
DUPLEX ULTRASOUND OF ABDOMINAL AORTA  INDICATION:  Follow up abdominal aortic aneurysm.  HISTORY: Diabetes:  No. Cardiac:  CABG, CAD. Hypertension:  Yes. Smoking:  Currently. Connective Tissue Disorder: Family History:  No. Previous Surgery:  No intervention.  DUPLEX EXAM:         AP (cm)                   TRANSVERSE (cm) Proximal             2.52 cm                   2.71 cm Mid                  3.12 cm                   3.18 cm Distal               4.64 cm Right Iliac          2.05 cm                   2.05 cm Left Iliac           2.42 cm                   2.42 cm  PREVIOUS:  Date: 11/04/2009  AP:  4.12  TRANSVERSE:  4.41  IMPRESSION: 1. Infrarenal abdominal aortic aneurysm measuring 4.64 cm in diameter     with intramural thrombus present. 2. Aneurysmal dilatation of the mid aorta measuring 2.1 X 3.1 cm. 3. 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  Right     iliac artery system presents with occlusion from mid common iliac     to mid external iliac artery. 4. Left common iliac artery and internal carotid artery present with     50% to 75% stenosis.  The left external iliac artery presents with     >75% stenosis.  ___________________________________________ Di Kindle. Edilia Bo, M.D.  SH/MEDQ  D:  12/14/2010  T:  12/14/2010  Job:  [id number]

## 2011-01-11 ENCOUNTER — Ambulatory Visit: Payer: Medicare Other | Admitting: Vascular Surgery

## 2011-01-17 ENCOUNTER — Encounter: Payer: Self-pay | Admitting: Vascular Surgery

## 2011-01-18 ENCOUNTER — Encounter: Payer: Self-pay | Admitting: Vascular Surgery

## 2011-01-18 ENCOUNTER — Ambulatory Visit (INDEPENDENT_AMBULATORY_CARE_PROVIDER_SITE_OTHER): Payer: Medicare Other | Admitting: Vascular Surgery

## 2011-01-18 VITALS — BP 173/91 | HR 74 | Resp 12 | Ht 70.0 in | Wt 195.0 lb

## 2011-01-18 DIAGNOSIS — I714 Abdominal aortic aneurysm, without rupture, unspecified: Secondary | ICD-10-CM

## 2011-01-18 NOTE — Progress Notes (Signed)
[facility]  Patient name: [name] [name] MRN: [id number] DOB: [birth year] Sex: male  CC: of abdominal aortic aneurysm and iliac artery occlusive disease  HPI: [name] [name] is a 77 y.o. male I last saw in September of 2009. He most recently had a followup study of his aneurysm one year ago in September of 2011 when the maximum diameter was 4.4 cm. Since I seen him last his only complaint is paresthesias in the right leg which she relates to neuropathy. His activity is very limited and he has only mild claudication. He denies rest pain. He's had no nonhealing ulcers. He denies any abdominal or back pain.  He does have a history of hypertension and hyperlipidemia both of which are stable on his current medications. His also had a previous myocardial infarction but has had no recent cardiac problems.  Past Medical History  Diagnosis Date  . HTN (hypertension)   . Dyslipidemia   . CVA (cerebral vascular accident)     1997, affected right side  . AAA (abdominal aortic aneurysm)     Also aneurysms of the infrarenal aorta and iliac arteries.. followed Dr. Edilia Bo  . Renal artery stenosis     Noted, 2005, medical therapy  . Tobacco abuse   . CAD (coronary artery disease)   . Hx of CABG     2005  . Ejection fraction     Normal,, 2005,( no echo data as of May, 201 2)  . Neuropathy     Right foot  . Myocardial infarction 2005  . Leg pain   . Abdominal pain   . Hyperlipidemia   . Nervousness   . Peripheral vascular disease   . Carotid artery occlusion     History reviewed. No pertinent family history.  SOCIAL HISTORY: History  Substance Use Topics  . Smoking status: Current Everyday Smoker -- 0.5 packs/day for 50 years    Types: Cigarettes  . Smokeless tobacco: Not on file  . Alcohol Use: Yes     occasionally    No Known Allergies  Current Outpatient Prescriptions  Medication Sig Dispense Refill  . aspirin 81 MG tablet Take 162 mg by mouth  daily.        Marland Kitchen atorvastatin (LIPITOR) 40 MG tablet Take 40 mg by mouth daily.        . carvedilol (COREG) 25 MG tablet Take 25 mg by mouth daily.        . chlorthalidone (HYGROTON) 25 MG tablet Take 25 mg by mouth every other day.        . cholecalciferol (VITAMIN D) 1000 UNITS tablet Take 1,000 Units by mouth daily.        Marland Kitchen lisinopril (PRINIVIL,ZESTRIL) 10 MG tablet Take 10 mg by mouth daily.        . pioglitazone (ACTOS) 15 MG tablet Take 15 mg by mouth daily.        Marland Kitchen tolterodine (DETROL LA) 4 MG 24 hr capsule Take 4 mg by mouth every other day.        . traMADol (ULTRAM) 50 MG tablet as needed.       . vitamin B-12 (CYANOCOBALAMIN) 100 MCG tablet Take 50 mcg by mouth daily.          REVIEW OF SYSTEMS: Arly.Keller ] denotes positive finding; [  ] denotes negative finding CARDIOVASCULAR:  [ ]  chest pain   [ ]  chest pressure   [ ]  palpitations   [ ]  orthopnea   [  X ] dyspnea on exertion   Arly.Keller ] claudication   [ ]  rest pain   [ ]  DVT   [ ]  phlebitis PULMONARY:   Arly.Keller ] productive cough in AM   [ ]  asthma   [ ]  wheezing NEUROLOGIC:   [ ]  weakness  Arly.Keller ] paresthesias right foot  [ ]  aphasia  [ ]  amaurosis  [ ]  dizziness HEMATOLOGIC:   [ ]  bleeding problems   [ ]  clotting disorders MUSCULOSKELETAL:  [ ]  joint pain   [ ]  joint swelling [ ]  leg swelling GASTROINTESTINAL: [ ]   blood in stool  [ ]   hematemesis GENITOURINARY:  [ ]   dysuria  [ ]   hematuria PSYCHIATRIC:  [ ]  history of major depression INTEGUMENTARY:  [ ]  rashes  [ ]  ulcers CONSTITUTIONAL:  [ ]  fever   [ ]  chills  PHYSICAL EXAM: Filed Vitals:   01/18/11 0916 01/18/11 0917  BP: 168/91 173/91  Pulse: 72 74  Resp: 18 12  Height: 5\' 10"  (1.778 m)   Weight: 195 lb (88.451 kg)   SpO2: 96%    Body mass index is 27.98 kg/(m^2). GENERAL: The patient is a well-nourished male, in no acute distress. The vital signs are documented above. CARDIOVASCULAR: There is a regular rate and rhythm without significant murmur appreciated. I do not detect  carotid bruits. Cannot palpate femoral pulses. I cannot palpate pedal pulses. However, both feet are warm and well-perfused. He has no significant lower Cherie swelling. PULMONARY: There is good air exchange bilaterally without wheezing or rales. ABDOMEN: Soft and non-tender with normal pitched bowel sounds. His aneurysm is palpable and nontender. MUSCULOSKELETAL: There are no major deformities or cyanosis. NEUROLOGIC: No focal weakness or paresthesias are detected. SKIN: There are no ulcers or rashes noted. PSYCHIATRIC: The patient has a normal affect.  DATA:  I have independently interpreted his duplex scan of his abdominal aortic aneurysm which shows that the maximum diameter is 4.64 cm. This is increased only slightly over the last year. The maximum diameter of the right iliac artery is 2.05 cm. Maximum diameter of the left common iliac artery is 2.42 cm. These measurements likewise are stable.  MEDICAL ISSUES: 1. Again explained that we would not consider elective air his aneurysm unless it reached 5.5 cm in maximum diameter. I have ordered a followup ultrasound in one year now see him back at that time. In addition we have discussed the importance of tobacco cessation and he does understand that continued smoking increases his risk of aneurysm expansion. Currently,  he does not appear to be interested in tobacco cessation and therefore I did not refer her to the tobacco cessation clinic. He states he'll let us know if he changes his mind. 2. Is respect to his bilateral iliac artery occlusive disease I will obtain followup ABIs when he returns in one year. I explained that his symptoms progress we could consider periodic male attack angioplasty although currently feels her symptoms are quite stable.  Royetta Probus S [facility] [place] Office: [phone]

## 2011-02-03 ENCOUNTER — Emergency Department (HOSPITAL_COMMUNITY): Payer: Medicare Other

## 2011-02-03 ENCOUNTER — Emergency Department (HOSPITAL_COMMUNITY)
Admission: EM | Admit: 2011-02-03 | Discharge: 2011-02-03 | Disposition: A | Discharge status: Home / Self Care | Payer: Medicare Other | Attending: Emergency Medicine | Admitting: Emergency Medicine

## 2011-02-03 ENCOUNTER — Encounter (HOSPITAL_COMMUNITY): Payer: Self-pay | Admitting: *Deleted

## 2011-02-03 DIAGNOSIS — R5381 Other malaise: Secondary | ICD-10-CM | POA: Insufficient documentation

## 2011-02-03 DIAGNOSIS — I714 Abdominal aortic aneurysm, without rupture, unspecified: Secondary | ICD-10-CM | POA: Insufficient documentation

## 2011-02-03 DIAGNOSIS — Z951 Presence of aortocoronary bypass graft: Secondary | ICD-10-CM | POA: Insufficient documentation

## 2011-02-03 DIAGNOSIS — G319 Degenerative disease of nervous system, unspecified: Secondary | ICD-10-CM | POA: Insufficient documentation

## 2011-02-03 DIAGNOSIS — I10 Essential (primary) hypertension: Secondary | ICD-10-CM | POA: Insufficient documentation

## 2011-02-03 DIAGNOSIS — I2510 Atherosclerotic heart disease of native coronary artery without angina pectoris: Secondary | ICD-10-CM | POA: Insufficient documentation

## 2011-02-03 DIAGNOSIS — I252 Old myocardial infarction: Secondary | ICD-10-CM | POA: Insufficient documentation

## 2011-02-03 DIAGNOSIS — R531 Weakness: Secondary | ICD-10-CM

## 2011-02-03 DIAGNOSIS — Z8673 Personal history of transient ischemic attack (TIA), and cerebral infarction without residual deficits: Secondary | ICD-10-CM | POA: Insufficient documentation

## 2011-02-03 DIAGNOSIS — E785 Hyperlipidemia, unspecified: Secondary | ICD-10-CM | POA: Insufficient documentation

## 2011-02-03 DIAGNOSIS — F17200 Nicotine dependence, unspecified, uncomplicated: Secondary | ICD-10-CM | POA: Insufficient documentation

## 2011-02-03 DIAGNOSIS — G5790 Unspecified mononeuropathy of unspecified lower limb: Secondary | ICD-10-CM | POA: Insufficient documentation

## 2011-02-03 LAB — POCT I-STAT TROPONIN I: Troponin i, poc: 0.02 ng/mL (ref 0.00–0.08)

## 2011-02-03 LAB — URINALYSIS, ROUTINE W REFLEX MICROSCOPIC
Ketones, ur: NEGATIVE mg/dL
Leukocytes, UA: NEGATIVE
Nitrite: NEGATIVE
Specific Gravity, Urine: 1.015 (ref 1.005–1.030)
pH: 7 (ref 5.0–8.0)

## 2011-02-03 LAB — CBC
Hemoglobin: 13.2 g/dL (ref 13.0–17.0)
MCH: 30.1 pg (ref 26.0–34.0)
MCHC: 32 g/dL (ref 30.0–36.0)
MCV: 94.1 fL (ref 78.0–100.0)
RBC: 4.39 MIL/uL (ref 4.22–5.81)

## 2011-02-03 LAB — COMPREHENSIVE METABOLIC PANEL
Albumin: 3.3 g/dL — ABNORMAL LOW (ref 3.5–5.2)
BUN: 23 mg/dL (ref 6–23)
Calcium: 9.3 mg/dL (ref 8.4–10.5)
Creatinine, Ser: 1.57 mg/dL — ABNORMAL HIGH (ref 0.50–1.35)
GFR calc Af Amer: 47 mL/min — ABNORMAL LOW (ref >90)
Glucose, Bld: 108 mg/dL — ABNORMAL HIGH (ref 70–99)
Total Protein: 6.8 g/dL (ref 6.0–8.3)

## 2011-02-03 LAB — DIFFERENTIAL
Basophils Relative: 1 % (ref 0–1)
Eosinophils Absolute: 0.3 10*3/uL (ref 0.0–0.7)
Eosinophils Relative: 3 % (ref 0–5)
Lymphs Abs: 2.1 10*3/uL (ref 0.7–4.0)
Monocytes Relative: 8 % (ref 3–12)

## 2011-02-03 MED ORDER — SODIUM CHLORIDE 0.9 % IV BOLUS (SEPSIS)
500.0000 mL | Freq: Once | INTRAVENOUS | Status: AC
  Administered 2011-02-03: 500 mL via INTRAVENOUS

## 2011-02-03 NOTE — ED Provider Notes (Signed)
History     CSN: [id number] Arrival date & time: 02/03/2011 10:29 AM   First MD Initiated Contact with Patient 02/03/11 1053     11:37am HPI Patient reports waking up at 6:30 this morning. States he drank his coffee-like normal and a small slice of pumping and time. Reports that after her began having generalized weakness and had to stumble to his bed. States since then weakness has improved. Reports a significant history of a CVA and an MI. Denies any upper respiratory infection symptoms, headache, chest pain, shortness breath, numbness, tingling, neck pain, change in vision, change in speech, change in ambulation. Patient is a 77 y.o. male presenting with weakness. The history is provided by the patient.  Weakness Primary symptoms do not include headaches, loss of consciousness, altered mental status, seizures, dizziness, paresthesias, focal weakness, loss of sensation, speech change, fever, nausea or vomiting. The symptoms began 2 to 6 hours ago. The symptoms are improving. The neurological symptoms are diffuse.  Additional symptoms include weakness. Additional symptoms do not include neck stiffness, pain, lower back pain, loss of balance, photophobia, aura, hallucinations, nystagmus, hearing loss, tinnitus, vertigo or anxiety. Medical issues also include cerebral vascular accident and hypertension. Medical issues do not include diabetes.    Past Medical History  Diagnosis Date  . HTN (hypertension)   . Dyslipidemia   . CVA (cerebral vascular accident)     1997, affected right side  . AAA (abdominal aortic aneurysm)     Also aneurysms of the infrarenal aorta and iliac arteries.. followed Dr. Edilia Bo  . Renal artery stenosis     Noted, 2005, medical therapy  . Tobacco abuse   . CAD (coronary artery disease)   . Hx of CABG     2005  . Ejection fraction     Normal,, 2005,( no echo data as of May, 201 2)  . Neuropathy     Right foot  . Myocardial infarction 2005  . Leg pain   .  Abdominal pain   . Hyperlipidemia   . Nervousness   . Peripheral vascular disease   . Carotid artery occlusion     Past Surgical History  Procedure Date  . Coronary artery bypass graft 2005    x4  . Spine surgery 1977    Lumbar HNP surgery    No family history on file.  History  Substance Use Topics  . Smoking status: Current Everyday Smoker -- 0.5 packs/day for 50 years    Types: Cigarettes  . Smokeless tobacco: Not on file  . Alcohol Use: Yes     occasionally      Review of Systems  Constitutional: Negative for fever and diaphoresis.  HENT: Negative for hearing loss, neck stiffness and tinnitus.   Eyes: Negative for photophobia.  Respiratory: Negative for shortness of breath.   Cardiovascular: Negative for chest pain.  Gastrointestinal: Negative for nausea, vomiting and abdominal pain.  Genitourinary: Negative for dysuria and hematuria.  Musculoskeletal: Negative for back pain.  Neurological: Positive for weakness. Negative for dizziness, vertigo, speech change, focal weakness, seizures, loss of consciousness, syncope, facial asymmetry, speech difficulty, light-headedness, numbness, headaches, paresthesias and loss of balance.  Psychiatric/Behavioral: Negative for hallucinations and altered mental status.  All other systems reviewed and are negative.    Allergies  Review of patient's allergies indicates no known allergies.  Home Medications   Current Outpatient Rx  Name Route Sig Dispense Refill  . ASPIRIN EC 81 MG PO TBEC Oral Take 162 mg by mouth daily.      Marland Kitchen  ATORVASTATIN CALCIUM 40 MG PO TABS Oral Take 40 mg by mouth daily.      Marland Kitchen CARVEDILOL 25 MG PO TABS Oral Take 25 mg by mouth daily.      . CHLORTHALIDONE 25 MG PO TABS Oral Take 25 mg by mouth every other day.      Marland Kitchen VITAMIN D 1000 UNITS PO TABS Oral Take 1,000 Units by mouth daily.      Marland Kitchen LISINOPRIL 10 MG PO TABS Oral Take 10 mg by mouth daily.      Marland Kitchen PIOGLITAZONE HCL 15 MG PO TABS Oral Take 15 mg by  mouth daily.      . TOLTERODINE TARTRATE 4 MG PO CP24 Oral Take 4 mg by mouth every other day.      . TRAMADOL HCL 50 MG PO TABS Oral Take 50 mg by mouth every 6 (six) hours as needed. For pain    . VITAMIN B-12 100 MCG PO TABS Oral Take 50 mcg by mouth daily.      Marland Kitchen ZOLPIDEM TARTRATE 10 MG PO TABS Oral Take 10 mg by mouth at bedtime.        BP 116/62  Pulse 59  Temp(Src) 97.8 F (36.6 C) (Oral)  Resp 20  Ht 5\' 10"  (1.778 m)  Wt 195 lb (88.451 kg)  BMI 27.98 kg/m2  SpO2 98%  Physical Exam  Vitals reviewed. Constitutional: He is oriented to person, place, and time. He appears well-developed and well-nourished.  HENT:  Head: Normocephalic and atraumatic.  Right Ear: Tympanic membrane, external ear and ear canal normal.  Left Ear: Tympanic membrane, external ear and ear canal normal.  Nose: Nose normal.  Mouth/Throat: Uvula is midline, oropharynx is clear and moist and mucous membranes are normal.  Eyes: Conjunctivae are normal. Pupils are equal, round, and reactive to light.  Neck: Normal range of motion. Neck supple. No tracheal tenderness, no spinous process tenderness and no muscular tenderness present. No rigidity.  Cardiovascular: Normal rate, regular rhythm and normal heart sounds.  Exam reveals no gallop and no friction rub.   No murmur heard. Pulmonary/Chest: Effort normal and breath sounds normal. He has no wheezes. He has no rales. He exhibits no tenderness.  Abdominal: Soft. Bowel sounds are normal. He exhibits no distension and no mass. There is no tenderness. There is no rebound and no guarding.  Neurological: He is alert and oriented to person, place, and time. No cranial nerve deficit or sensory deficit. Coordination normal. GCS eye subscore is 4. GCS verbal subscore is 5. GCS motor subscore is 6.  Skin: Skin is warm and dry. No rash noted. No erythema. No pallor.  Psychiatric: He has a normal mood and affect. His behavior is normal.    ED Course  Procedures    Results for orders placed during the hospital encounter of 02/03/11  CBC      Component Value Range   WBC 9.1  4.0 - 10.5 (K/uL)   RBC 4.39  4.22 - 5.81 (MIL/uL)   Hemoglobin 13.2  13.0 - 17.0 (g/dL)   HCT 16.1  09.6 - 04.5 (%)   MCV 94.1  78.0 - 100.0 (fL)   MCH 30.1  26.0 - 34.0 (pg)   MCHC 32.0  30.0 - 36.0 (g/dL)   RDW 40.9  81.1 - 91.4 (%)   Platelets 274  150 - 400 (K/uL)  DIFFERENTIAL      Component Value Range   Neutrophils Relative 66  43 - 77 (%)  Neutro Abs 6.1  1.7 - 7.7 (K/uL)   Lymphocytes Relative 23  12 - 46 (%)   Lymphs Abs 2.1  0.7 - 4.0 (K/uL)   Monocytes Relative 8  3 - 12 (%)   Monocytes Absolute 0.7  0.1 - 1.0 (K/uL)   Eosinophils Relative 3  0 - 5 (%)   Eosinophils Absolute 0.3  0.0 - 0.7 (K/uL)   Basophils Relative 1  0 - 1 (%)   Basophils Absolute 0.1  0.0 - 0.1 (K/uL)  COMPREHENSIVE METABOLIC PANEL      Component Value Range   Sodium 138  135 - 145 (mEq/L)   Potassium 4.5  3.5 - 5.1 (mEq/L)   Chloride 103  96 - 112 (mEq/L)   CO2 31  19 - 32 (mEq/L)   Glucose, Bld 108 (*) 70 - 99 (mg/dL)   BUN 23  6 - 23 (mg/dL)   Creatinine, Ser 1.19 (*) 0.50 - 1.35 (mg/dL)   Calcium 9.3  8.4 - 14.7 (mg/dL)   Total Protein 6.8  6.0 - 8.3 (g/dL)   Albumin 3.3 (*) 3.5 - 5.2 (g/dL)   AST 23  0 - 37 (U/L)   ALT 16  0 - 53 (U/L)   Alkaline Phosphatase 79  39 - 117 (U/L)   Total Bilirubin 0.4  0.3 - 1.2 (mg/dL)   GFR calc non Af Amer 41 (*) >90 (mL/min)   GFR calc Af Amer 47 (*) >90 (mL/min)  URINALYSIS, ROUTINE W REFLEX MICROSCOPIC      Component Value Range   Color, Urine YELLOW  YELLOW    APPearance CLEAR  CLEAR    Specific Gravity, Urine 1.015  1.005 - 1.030    pH 7.0  5.0 - 8.0    Glucose, UA NEGATIVE  NEGATIVE (mg/dL)   Hgb urine dipstick NEGATIVE  NEGATIVE    Bilirubin Urine NEGATIVE  NEGATIVE    Ketones, ur NEGATIVE  NEGATIVE (mg/dL)   Protein, ur NEGATIVE  NEGATIVE (mg/dL)   Urobilinogen, UA 1.0  0.0 - 1.0 (mg/dL)   Nitrite NEGATIVE  NEGATIVE     Leukocytes, UA NEGATIVE  NEGATIVE   POCT I-STAT TROPONIN I      Component Value Range   Troponin i, poc 0.02  0.00 - 0.08 (ng/mL)   Comment 3            Dg Chest 2 View  02/03/2011  *RADIOLOGY REPORT*  Clinical Data: Weakness, shortness of breath, chest pressure, vomiting  CHEST - 2 VIEW  Comparison: Chest x-ray of 05/20/2004  Findings: The lungs are clear and slightly hyperaerated. Cardiomegaly is stable.  Median sternotomy sutures are noted from prior CABG.  No acute bony abnormality is seen.  IMPRESSION: Cardiomegaly.  No active lung disease.  No change in hyperaeration.  Original Report Authenticated By: Juline Patch, M.D.   Ct Head Wo Contrast  02/03/2011  *RADIOLOGY REPORT*  Clinical Data: Weakness and nausea.  Vomiting.  History of myeloma.  CT HEAD WITHOUT CONTRAST  Technique:  Contiguous axial images were obtained from the base of the skull through the vertex without contrast.  Comparison: None.  Findings: Moderate generalized atrophy is present.  There are remote lacunar infarcts involving the lentiform nucleus on the right and caudate head and thalamus on the left.  The basal ganglia are otherwise intact.  Moderate confluent periventricular and subcortical white matter hypoattenuation is evident bilaterally. The gray-white differentiation is intact.  The paranasal sinuses and mastoid air cells are clear. Atherosclerotic  calcifications are evident in the cavernous carotid arteries and at the dural margins of the vertebral arteries bilaterally.  IMPRESSION:  1.  Atrophy and moderate generalized white matter disease.  This likely reflects the sequelae of chronic microvascular ischemia. 2.  Remote lacunar infarcts of the basal ganglia bilaterally. 3.  No acute intracranial abnormality.  Original Report Authenticated By: Jamesetta Orleans. MATTERN, M.D.   ED ECG REPORT   Date: 02/03/2011  EKG Time: 11:52am  Rate: 54  Rhythm: Sinus rhythm  Axis: Left axis deviation  Intervals:first-degree A-V  block   ST&T Change: T wave flattening.  Narrative Interpretation: changed, first degree AV block. Advised followup with primary care physician and/or Dr. Myrtis Ser.            MDM   3:30 PM Patient is feeling much better. Blood pressure within normal range. Labs and imaging normal. Will DC home but advised followup with primary care physician Dr. Pearson Grippe early next week for reassessment. Strongly advised immediate return to emergency department for worsening symptoms. Family and patient agree on plan and are ready for discharge.   Medical screening examination/treatment/procedure(s) were conducted as a shared visit with non-physician practitioner(s) and myself.  I personally evaluated the patient during the encounter Pt is a pleasant elderly man who felt weak after eating breakfast and taking his morning medicines.  He has a prior history of coronary artery disease with MI, prior stroke, hypertension, known AAA and known peripheral vascular disease.  Exam today shows him awake and alert.  Heart and lungs are good.  Abdomen soft and nontender.  He is neurologically intact.  His lab workup showed a mild renal insufficiency.  He seems to have recovered from his episode of weakness.  Released. Osvaldo Human, M.D.      [place], [place] 02/03/11 1539  Carleene Cooper III, MD 02/03/11 2014

## 2011-02-03 NOTE — ED Notes (Signed)
MD at bedside.Dr Davidson 

## 2011-02-03 NOTE — Progress Notes (Signed)
Visited with family (wife Harriett Sine and friends) and patient. Patient very weak. Family lost their son this week after lengthy cancer illness. Erasmo Score is tomorrow at [employer]. Support; presence; prayer.  02/03/11 1300  Clinical Encounter Type  Visited With Patient and family together  Visit Type Spiritual support  Referral From Chaplain  Recommendations Follow up  Spiritual Encounters  Spiritual Needs Prayer;Emotional;Other (Comment) (Presence)  Stress Factors  Patient Stress Factors Loss  Family Stress Factors Loss

## 2011-02-03 NOTE — ED Notes (Signed)
Awakened this a.m. With extreme weakness---per EMS B/P on scene 100/58

## 2011-02-03 NOTE — ED Notes (Signed)
YNW:[id number]<OZ> Expected date:02/03/11<BR> Expected time:10:01 AM<BR> Means of arrival:Ambulance<BR> Comments:<BR>

## 2011-02-03 NOTE — ED Notes (Signed)
Secondary Assessment: pt woke up this morning, had a cup of coffee and a piece of pumpkin pie at 0630 and felt sudden weakness at 0830. Reports weakness as generalized with no nausea, vomiting, dizziness. No diapheresis noted.  Denied any visual problem, denied pain, headache, BP 116/62 in room, pt reports this is a low BP for him. Pt took all his morning meds this morning. Pt alert, oriented, neurologically intact. Airway intact. Breath sound clear. HR 57 Regular

## 2011-04-11 DIAGNOSIS — K130 Diseases of lips: Secondary | ICD-10-CM | POA: Diagnosis not present

## 2011-05-18 DIAGNOSIS — D485 Neoplasm of uncertain behavior of skin: Secondary | ICD-10-CM | POA: Diagnosis not present

## 2011-05-18 DIAGNOSIS — K130 Diseases of lips: Secondary | ICD-10-CM | POA: Diagnosis not present

## 2011-07-10 ENCOUNTER — Encounter: Payer: Self-pay | Admitting: Cardiology

## 2011-07-12 ENCOUNTER — Ambulatory Visit: Payer: Medicare Other | Admitting: Cardiology

## 2011-07-13 ENCOUNTER — Ambulatory Visit: Payer: Medicare Other | Admitting: Cardiology

## 2011-08-18 ENCOUNTER — Encounter: Payer: Self-pay | Admitting: Cardiology

## 2011-08-21 ENCOUNTER — Encounter: Payer: Self-pay | Admitting: Cardiology

## 2011-08-21 ENCOUNTER — Ambulatory Visit (INDEPENDENT_AMBULATORY_CARE_PROVIDER_SITE_OTHER): Payer: Medicare Other | Admitting: Cardiology

## 2011-08-21 VITALS — BP 140/84 | HR 92 | Resp 16 | Ht 70.0 in | Wt 201.8 lb

## 2011-08-21 DIAGNOSIS — I714 Abdominal aortic aneurysm, without rupture, unspecified: Secondary | ICD-10-CM

## 2011-08-21 DIAGNOSIS — I10 Essential (primary) hypertension: Secondary | ICD-10-CM | POA: Diagnosis not present

## 2011-08-21 DIAGNOSIS — G589 Mononeuropathy, unspecified: Secondary | ICD-10-CM

## 2011-08-21 DIAGNOSIS — E785 Hyperlipidemia, unspecified: Secondary | ICD-10-CM | POA: Diagnosis not present

## 2011-08-21 DIAGNOSIS — I2510 Atherosclerotic heart disease of native coronary artery without angina pectoris: Secondary | ICD-10-CM | POA: Diagnosis not present

## 2011-08-21 DIAGNOSIS — G629 Polyneuropathy, unspecified: Secondary | ICD-10-CM

## 2011-08-21 NOTE — Assessment & Plan Note (Signed)
The patient's lipids are being treated appropriately.

## 2011-08-21 NOTE — Patient Instructions (Addendum)
Your physician wants you to follow-up in: 12 months.  You will receive a reminder letter in the mail two months in advance. If you don't receive a letter, please call our office to schedule the follow-up appointment.  Your physician has requested that you have a lexiscan myoview. For further information please visit [email or web address] Please follow instruction sheet, as given.  We will call you with your test results.

## 2011-08-21 NOTE — Assessment & Plan Note (Signed)
Blood pressure is controlled. No change in therapy. 

## 2011-08-21 NOTE — Progress Notes (Signed)
HPI  The patient is seen for cardiology followup. I saw him last in May, 2012. He is limited by his peripheral neuropathy. However he's not having any chest pain or shortness of breath. His original symptom in 2005 before his CABG, was a vague sensation of nausea and indigestion. He did not have any classic chest pain. Over time he is done well. Last year I decided to obtain a 2-D echo. This was done in June, 2012. The EF was 65-70%. There were no major valvular abnormalities.  Patient returns and he is stable. He did have one episode of marked dizziness several months ago. It is possible that he took access blood pressure medications by mistake. This resolved and he has been stable since.  No Known Allergies  Current Outpatient Prescriptions  Medication Sig Dispense Refill  . aspirin EC 81 MG tablet Take 162 mg by mouth daily.        Marland Kitchen atorvastatin (LIPITOR) 40 MG tablet Take 40 mg by mouth daily.        . carvedilol (COREG) 25 MG tablet Take 25 mg by mouth daily.        . chlorthalidone (HYGROTON) 25 MG tablet Take 25 mg by mouth every other day.        . lisinopril (PRINIVIL,ZESTRIL) 10 MG tablet Take 10 mg by mouth daily.        . pioglitazone (ACTOS) 15 MG tablet Take 15 mg by mouth daily.        Marland Kitchen tolterodine (DETROL LA) 4 MG 24 hr capsule Take 4 mg by mouth every other day.        . traMADol (ULTRAM) 50 MG tablet Take 50 mg by mouth every 6 (six) hours as needed. For pain      . zolpidem (AMBIEN) 10 MG tablet Take 10 mg by mouth at bedtime.        . cholecalciferol (VITAMIN D) 1000 UNITS tablet Take 1,000 Units by mouth daily.        . vitamin B-12 (CYANOCOBALAMIN) 100 MCG tablet Take 50 mcg by mouth daily.          History   Social History  . Marital Status: Married    Spouse Name: N/A    Number of Children: N/A  . Years of Education: N/A   Occupational History  . retired    Social History Main Topics  . Smoking status: Current Everyday Smoker -- 0.5 packs/day for 50  years    Types: Cigarettes  . Smokeless tobacco: Not on file  . Alcohol Use: Yes     occasionally  . Drug Use: No  . Sexually Active: Not on file   Other Topics Concern  . Not on file   Social History Narrative  . No narrative on file    No family history on file.  Past Medical History  Diagnosis Date  . HTN (hypertension)   . Dyslipidemia   . CVA (cerebral vascular accident)     1997, affected right side  . AAA (abdominal aortic aneurysm)     Also aneurysms of the infrarenal aorta and iliac arteries.. followed Dr. Edilia Bo  . Renal artery stenosis     Noted, 2005, medical therapy  . Tobacco abuse   . CAD (coronary artery disease)   . Hx of CABG     2005  . Ejection fraction     Normal,, 2005,( no echo data as of May, 201 2)  . Neuropathy  Right foot  . Myocardial infarction 2005  . Leg pain   . Abdominal pain   . Hyperlipidemia   . Nervousness   . Peripheral vascular disease   . Carotid artery occlusion     Past Surgical History  Procedure Date  . Coronary artery bypass graft 2005    x4  . Spine surgery 1977    Lumbar HNP surgery    ROS   Patient denies fever, chills, headache, sweats, rash, change in vision, change in hearing, chest pain, cough, nausea vomiting, urinary symptoms. All other systems are reviewed and are negative.  PHYSICAL EXAM   Patient is oriented to person time and place. Affect is normal. There is no jugulovenous distention. There no carotid bruits. Lungs are clear. Respiratory effort is nonlabored. Cardiac exam reveals S1 and S2. There no clicks or significant murmurs. The abdomen is soft. There is no peripheral edema. He walks with a cane. There are no skin rashes.  Filed Vitals:   08/21/11 1122  BP: 140/84  Pulse: 92  Resp: 16  Height: 5\' 10"  (1.778 m)  Weight: 201 lb 12.8 oz (91.536 kg)   EKG is done today and reviewed by me. There is sinus rhythm. There is left axis. There is no significant change.  ASSESSMENT & PLAN

## 2011-08-21 NOTE — Assessment & Plan Note (Signed)
The patient underwent CABG in 2005. He has had no exercise testing since that time. His original symptom was vague. He did not have classic chest pain. I have reviewed this data carefully and discussed it with him. It has been 8 years since he has had any type of ischemic evaluation. I've recommended to him that we proceed with a pharmacologic stress nuclear scan. He is in agre this will be done. I will be in touch with him with the result. Unless we see any significant abnormality, I will plan to see him back in one year.

## 2011-08-21 NOTE — Assessment & Plan Note (Signed)
The patient's vascular disease is followed very carefully by vascular surgery.  I have reviewed the most recent records from Dr. Edilia Bo. The patient is stable. He does have an abdominal aortic aneurysm. He November, 2012, there was very slight increase in size.

## 2011-08-21 NOTE — Assessment & Plan Note (Signed)
His neuropathy appears to affect his walking ability. This is not cardiovascular in origin.

## 2011-08-22 NOTE — Addendum Note (Signed)
Addended by: Reine Just on: 08/22/2011 04:32 PM   Modules accepted: Orders

## 2011-08-28 ENCOUNTER — Ambulatory Visit (HOSPITAL_COMMUNITY): Payer: Medicare Other | Attending: Cardiology | Admitting: Radiology

## 2011-08-28 VITALS — BP 152/84 | HR 52 | Ht 70.0 in | Wt 201.0 lb

## 2011-08-28 DIAGNOSIS — R0609 Other forms of dyspnea: Secondary | ICD-10-CM | POA: Insufficient documentation

## 2011-08-28 DIAGNOSIS — E119 Type 2 diabetes mellitus without complications: Secondary | ICD-10-CM | POA: Diagnosis not present

## 2011-08-28 DIAGNOSIS — E785 Hyperlipidemia, unspecified: Secondary | ICD-10-CM | POA: Diagnosis not present

## 2011-08-28 DIAGNOSIS — R55 Syncope and collapse: Secondary | ICD-10-CM | POA: Insufficient documentation

## 2011-08-28 DIAGNOSIS — Z8673 Personal history of transient ischemic attack (TIA), and cerebral infarction without residual deficits: Secondary | ICD-10-CM | POA: Insufficient documentation

## 2011-08-28 DIAGNOSIS — I739 Peripheral vascular disease, unspecified: Secondary | ICD-10-CM | POA: Insufficient documentation

## 2011-08-28 DIAGNOSIS — I701 Atherosclerosis of renal artery: Secondary | ICD-10-CM | POA: Diagnosis not present

## 2011-08-28 DIAGNOSIS — R0989 Other specified symptoms and signs involving the circulatory and respiratory systems: Secondary | ICD-10-CM | POA: Insufficient documentation

## 2011-08-28 DIAGNOSIS — R42 Dizziness and giddiness: Secondary | ICD-10-CM | POA: Insufficient documentation

## 2011-08-28 DIAGNOSIS — I252 Old myocardial infarction: Secondary | ICD-10-CM | POA: Diagnosis not present

## 2011-08-28 DIAGNOSIS — Z951 Presence of aortocoronary bypass graft: Secondary | ICD-10-CM | POA: Insufficient documentation

## 2011-08-28 DIAGNOSIS — I2510 Atherosclerotic heart disease of native coronary artery without angina pectoris: Secondary | ICD-10-CM

## 2011-08-28 DIAGNOSIS — I150 Renovascular hypertension: Secondary | ICD-10-CM | POA: Diagnosis not present

## 2011-08-28 DIAGNOSIS — F17200 Nicotine dependence, unspecified, uncomplicated: Secondary | ICD-10-CM | POA: Insufficient documentation

## 2011-08-28 MED ORDER — TECHNETIUM TC 99M TETROFOSMIN IV KIT
10.0000 | PACK | Freq: Once | INTRAVENOUS | Status: AC | PRN
  Administered 2011-08-28: 10 via INTRAVENOUS

## 2011-08-28 MED ORDER — REGADENOSON 0.4 MG/5ML IV SOLN
0.4000 mg | Freq: Once | INTRAVENOUS | Status: AC
  Administered 2011-08-28: 0.4 mg via INTRAVENOUS

## 2011-08-28 MED ORDER — TECHNETIUM TC 99M TETROFOSMIN IV KIT
30.0000 | PACK | Freq: Once | INTRAVENOUS | Status: AC | PRN
  Administered 2011-08-28: 30 via INTRAVENOUS

## 2011-08-28 NOTE — Progress Notes (Signed)
[facility] SITE 3 NUCLEAR MED [address] [place] [address] [phone]  Cardiology Nuclear Med Study  [name] is a 78 y.o. male     MRN : [id number]     DOB: [birth year]  Procedure Date: 08/28/2011  Nuclear Med Background Indication for Stress Test:  Evaluation for Ischemia and Graft Patency History:  '05 MI>CABG; '12 Echo:EF=65-70%; AAA 4.6 cm/Renal Artery Stenosis Cardiac Risk Factors: CVA, Hypertension, Lipids, NIDDM, PVD and Smoker  Symptoms:  Dizziness, DOE and Near Syncope   Nuclear Pre-Procedure Caffeine/Decaff Intake:  None NPO After: 7:00pm   Lungs:  Clear. O2 Sat: 97% on room air. IV 0.9% NS with Angio Cath:  20g  IV Site: R Wrist  IV Started by:  Stanton Kidney, EMT-P  Chest Size (in):  43 Cup Size: n/a  Height: 5\' 10"  (1.778 m)  Weight:  201 lb (91.173 kg)  BMI:  Body mass index is 28.84 kg/(m^2). Tech Comments: Meds. taken as directed.    Nuclear Med Study 1 or 2 day study: 1 day  Stress Test Type:  Lexiscan  Reading MD: [name] Millers, MD  Order Authorizing Provider:  Willa Rough, MD  Resting Radionuclide: Technetium 17m Tetrofosmin  Resting Radionuclide Dose: 11.0 mCi   Stress Radionuclide:  Technetium 28m Tetrofosmin  Stress Radionuclide Dose: 32.9 mCi           Stress Protocol Rest HR: 52 Stress HR: 81  Rest BP: 152/84 Stress BP: 145/81  Exercise Time (min): n/a METS: n/a   Predicted Max HR: 143 bpm % Max HR: 56.64 bpm Rate Pressure Product: 52841   Dose of Adenosine (mg):  n/a Dose of Lexiscan: 0.4 mg  Dose of Atropine (mg): n/a Dose of Dobutamine: n/a mcg/kg/min (at max HR)  Stress Test Technologist: Smiley Houseman, CMA-N  Nuclear Technologist:  Doyne Keel, CNMT     Rest Procedure:  Myocardial perfusion imaging was performed at rest 45 minutes following the intravenous administration of Technetium 50m Tetrofosmin.  Rest ECG: LAD  Stress Procedure:  The patient received IV Lexiscan 0.4 mg over 15-seconds.   Technetium 10m Tetrofosmin injected at 30-seconds.  There were no significant changes with Lexiscan bolus; rare PAC.  Quantitative spect images were obtained after a 45 minute delay.  Stress ECG: No significant ST segment change suggestive of ischemia.  QPS Raw Data Images:  Acquisition technically good; normal left ventricular size. Stress Images:  There is decreased uptake in the inferior wall and apex. Rest Images:  There is decreased uptake in the inferior wall and apex. Subtraction (SDS):  No evidence of ischemia. Transient Ischemic Dilatation (Normal <1.22):  1.03 Lung/Heart Ratio (Normal <0.45):  0.32  Quantitative Gated Spect Images QGS EDV:  85 ml QGS ESV:  33 ml  Impression Exercise Capacity:  Lexiscan with no exercise. BP Response:  Normal blood pressure response. Clinical Symptoms:  No chest pain or dyspnea. ECG Impression:  No significant ST segment change suggestive of ischemia. Comparison with Prior Nuclear Study: No previous nuclear study performed.  Overall Impression:  Abnormal stress nuclear study with a mild, small, fixed apical defect and a medium, moderate size , fixed inferobasal defect consistent with apical thinning and a prior inferobasal infarct; no ischemia.  LV Ejection Fraction: 60%.  LV Wall Motion:  NL LV Function; NL Wall Motion     [name] [name]

## 2011-08-31 ENCOUNTER — Encounter: Payer: Self-pay | Admitting: Cardiology

## 2011-08-31 DIAGNOSIS — I2510 Atherosclerotic heart disease of native coronary artery without angina pectoris: Secondary | ICD-10-CM | POA: Insufficient documentation

## 2011-09-04 ENCOUNTER — Telehealth: Payer: Self-pay | Admitting: Cardiology

## 2011-09-04 NOTE — Telephone Encounter (Signed)
New msg °Pt wants to know test results from last week °

## 2011-09-04 NOTE — Telephone Encounter (Signed)
Pt was notified of results of nuclear study per Dr Myrtis Ser.

## 2011-11-23 ENCOUNTER — Encounter: Payer: Self-pay | Admitting: Cardiology

## 2011-11-23 DIAGNOSIS — I10 Essential (primary) hypertension: Secondary | ICD-10-CM | POA: Diagnosis not present

## 2011-11-23 DIAGNOSIS — Z125 Encounter for screening for malignant neoplasm of prostate: Secondary | ICD-10-CM | POA: Diagnosis not present

## 2011-11-23 DIAGNOSIS — E7800 Pure hypercholesterolemia, unspecified: Secondary | ICD-10-CM | POA: Diagnosis not present

## 2011-11-23 DIAGNOSIS — E119 Type 2 diabetes mellitus without complications: Secondary | ICD-10-CM | POA: Diagnosis not present

## 2011-11-30 DIAGNOSIS — Z23 Encounter for immunization: Secondary | ICD-10-CM | POA: Diagnosis not present

## 2011-11-30 DIAGNOSIS — I10 Essential (primary) hypertension: Secondary | ICD-10-CM | POA: Diagnosis not present

## 2011-11-30 DIAGNOSIS — E119 Type 2 diabetes mellitus without complications: Secondary | ICD-10-CM | POA: Diagnosis not present

## 2011-11-30 DIAGNOSIS — E7800 Pure hypercholesterolemia, unspecified: Secondary | ICD-10-CM | POA: Diagnosis not present

## 2011-11-30 DIAGNOSIS — B351 Tinea unguium: Secondary | ICD-10-CM | POA: Diagnosis not present

## 2011-12-04 DIAGNOSIS — Q6689 Other  specified congenital deformities of feet: Secondary | ICD-10-CM | POA: Diagnosis not present

## 2011-12-04 DIAGNOSIS — L608 Other nail disorders: Secondary | ICD-10-CM | POA: Diagnosis not present

## 2011-12-04 DIAGNOSIS — M79609 Pain in unspecified limb: Secondary | ICD-10-CM | POA: Diagnosis not present

## 2012-01-17 ENCOUNTER — Ambulatory Visit: Payer: Medicare Other | Admitting: Neurosurgery

## 2012-01-31 ENCOUNTER — Ambulatory Visit: Payer: Medicare Other | Admitting: Neurosurgery

## 2012-02-06 ENCOUNTER — Encounter: Payer: Self-pay | Admitting: Neurosurgery

## 2012-02-07 ENCOUNTER — Encounter (INDEPENDENT_AMBULATORY_CARE_PROVIDER_SITE_OTHER): Payer: Medicare Other | Admitting: *Deleted

## 2012-02-07 ENCOUNTER — Other Ambulatory Visit: Payer: Self-pay | Admitting: *Deleted

## 2012-02-07 ENCOUNTER — Ambulatory Visit (INDEPENDENT_AMBULATORY_CARE_PROVIDER_SITE_OTHER): Payer: Medicare Other | Admitting: Neurosurgery

## 2012-02-07 ENCOUNTER — Encounter: Payer: Self-pay | Admitting: Neurosurgery

## 2012-02-07 VITALS — BP 117/71 | HR 43 | Resp 14 | Ht 71.0 in | Wt 200.0 lb

## 2012-02-07 DIAGNOSIS — I714 Abdominal aortic aneurysm, without rupture, unspecified: Secondary | ICD-10-CM

## 2012-02-07 DIAGNOSIS — I739 Peripheral vascular disease, unspecified: Secondary | ICD-10-CM

## 2012-02-07 DIAGNOSIS — I70219 Atherosclerosis of native arteries of extremities with intermittent claudication, unspecified extremity: Secondary | ICD-10-CM

## 2012-02-07 DIAGNOSIS — I701 Atherosclerosis of renal artery: Secondary | ICD-10-CM

## 2012-02-07 NOTE — Addendum Note (Signed)
Addended by: Sharee Pimple on: 02/07/2012 01:38 PM   Modules accepted: Orders

## 2012-02-07 NOTE — Progress Notes (Signed)
[facility] [place] AAA/PAD/PVD Office Note  CC: AAA surveillance Referring Physician: Edilia Bo  History of Present Illness: 78 year old male patient of Dr. Edilia Bo with a known AAA. The patient denies any unusual abdominal or back pain. The patient also denies any new medical diagnoses or recent surgery.  Past Medical History  Diagnosis Date  . HTN (hypertension)   . Dyslipidemia   . CVA (cerebral vascular accident)     1997, affected right side  . AAA (abdominal aortic aneurysm)     Also aneurysms of the infrarenal aorta and iliac arteries.. followed Dr. Edilia Bo  . Renal artery stenosis     Noted, 2005, medical therapy  . Tobacco abuse   . CAD (coronary artery disease)     Nuclear, August 28, 2011, EF 60%, small fixed defect at the apex consistent with apical thinning and moderate fixed inferobasal defect compatible with prior infarct, no ischemia  . Hx of CABG     2005  . Ejection fraction     Normal,, 2005,( no echo data as of May, 201 2)  . Neuropathy     Right foot  . Myocardial infarction 2005  . Leg pain   . Abdominal pain   . Hyperlipidemia   . Nervousness   . Peripheral vascular disease   . Carotid artery occlusion     ROS: [x]  Positive   [ ]  Denies    General: [ ]  Weight loss, [ ]  Fever, [ ]  chills Neurologic: [ ]  Dizziness, [ ]  Blackouts, [ ]  Seizure [ ]  Stroke, [ ]  "Mini stroke", [ ]  Slurred speech, [ ]  Temporary blindness; [ ]  weakness in arms or legs, [ ]  Hoarseness Cardiac: [ ]  Chest pain/pressure, [ ]  Shortness of breath at rest [ ]  Shortness of breath with exertion, [ ]  Atrial fibrillation or irregular heartbeat Vascular: [ ]  Pain in legs with walking, [ ]  Pain in legs at rest, [ ]  Pain in legs at night,  [ ]  Non-healing ulcer, [ ]  Blood clot in vein/DVT,   Pulmonary: [ ]  Home oxygen, [ ]  Productive cough, [ ]  Coughing up blood, [ ]  Asthma,  [ ]  Wheezing Musculoskeletal:  [ ]  Arthritis, [ ]  Low back pain, [ ]  Joint pain Hematologic:  [ ]  Easy Bruising, [ ]  Anemia; [ ]  Hepatitis Gastrointestinal: [ ]  Blood in stool, [ ]  Gastroesophageal Reflux/heartburn, [ ]  Trouble swallowing Urinary: [ ]  chronic Kidney disease, [ ]  on HD - [ ]  MWF or [ ]  TTHS, [ ]  Burning with urination, [ ]  Difficulty urinating Skin: [ ]  Rashes, [ ]  Wounds Psychological: [ ]  Anxiety, [ ]  Depression   Social History History  Substance Use Topics  . Smoking status: Current Every Day Smoker -- 0.5 packs/day for 50 years    Types: Cigarettes  . Smokeless tobacco: Not on file  . Alcohol Use: Yes     Comment: occasionally    Family History History reviewed. No pertinent family history.  No Known Allergies  Current Outpatient Prescriptions  Medication Sig Dispense Refill  . aspirin EC 81 MG tablet Take 162 mg by mouth daily.        Marland Kitchen atorvastatin (LIPITOR) 40 MG tablet Take 40 mg by mouth daily.        . carvedilol (COREG) 25 MG tablet Take 25 mg by mouth daily.        . chlorthalidone (HYGROTON) 25 MG tablet Take 25 mg by mouth every other day.        Marland Kitchen  cholecalciferol (VITAMIN D) 1000 UNITS tablet Take 1,000 Units by mouth daily.        Marland Kitchen lisinopril (PRINIVIL,ZESTRIL) 10 MG tablet Take 10 mg by mouth daily.        . pioglitazone (ACTOS) 15 MG tablet Take 15 mg by mouth daily.        Marland Kitchen tolterodine (DETROL LA) 4 MG 24 hr capsule Take 4 mg by mouth every other day.        . traMADol (ULTRAM) 50 MG tablet Take 50 mg by mouth every 6 (six) hours as needed. For pain      . vitamin B-12 (CYANOCOBALAMIN) 100 MCG tablet Take 50 mcg by mouth daily.        Marland Kitchen zolpidem (AMBIEN) 10 MG tablet Take 10 mg by mouth at bedtime.          Physical Examination  Filed Vitals:   02/07/12 1026  BP: 117/71  Pulse: 43  Resp: 14    Body mass index is 27.89 kg/(m^2).  General:  WDWN in NAD Gait: Normal HEENT: WNL Eyes: Pupils equal Pulmonary: normal non-labored breathing , without Rales, rhonchi,  wheezing Cardiac: RRR, without  Murmurs, rubs or gallops; No  carotid bruits Abdomen: soft, NT, no masses Skin: no rashes, ulcers noted Vascular Exam/Pulses: 3+ radial pulses bilaterally, no carotid bruits are heard, there is a operable pulsatile abdominal mass, the patient does have palpable PT and DP pulses bilaterally  Extremities without ischemic changes, no Gangrene , no cellulitis; no open wounds;  Musculoskeletal: no muscle wasting or atrophy  Neurologic: A&O X 3; Appropriate Affect ; SENSATION: normal; MOTOR FUNCTION:  moving all extremities equally. Speech is fluent/normal  Non-Invasive Vascular Imaging: AAA duplex today shows a maximum diameter of 4.81 AP by 4.92 transverse this is a slight increase from previous exam in October 2012 when he was 4.6, the patient does have a right common iliac artery occlusion  ASSESSMENT/PLAN: Asymptomatic AAA that will followup in 6 months with repeat duplex. The patient's questions were encouraged and answered, he is in agreement with this plan.  Lauree Chandler ANP  Clinic M.D.: Edilia Bo

## 2012-07-26 ENCOUNTER — Other Ambulatory Visit: Payer: Self-pay | Admitting: *Deleted

## 2012-07-26 MED ORDER — LISINOPRIL 10 MG PO TABS: 10.0000 mg | ORAL_TABLET | Freq: Every day | ORAL | Status: DC

## 2012-07-31 DIAGNOSIS — I739 Peripheral vascular disease, unspecified: Secondary | ICD-10-CM | POA: Diagnosis not present

## 2012-07-31 DIAGNOSIS — E119 Type 2 diabetes mellitus without complications: Secondary | ICD-10-CM | POA: Diagnosis not present

## 2012-07-31 DIAGNOSIS — I10 Essential (primary) hypertension: Secondary | ICD-10-CM | POA: Diagnosis not present

## 2012-07-31 DIAGNOSIS — E7800 Pure hypercholesterolemia, unspecified: Secondary | ICD-10-CM | POA: Diagnosis not present

## 2012-08-07 ENCOUNTER — Ambulatory Visit: Payer: Medicare Other | Admitting: Neurosurgery

## 2012-08-07 ENCOUNTER — Ambulatory Visit (INDEPENDENT_AMBULATORY_CARE_PROVIDER_SITE_OTHER): Payer: Medicare Other | Admitting: *Deleted

## 2012-08-07 ENCOUNTER — Encounter (HOSPITAL_COMMUNITY)
Admission: RE | Disposition: A | Discharge status: Home / Self Care | Payer: Self-pay | Source: Ambulatory Visit | Attending: Vascular Surgery

## 2012-08-07 ENCOUNTER — Ambulatory Visit (HOSPITAL_BASED_OUTPATIENT_CLINIC_OR_DEPARTMENT_OTHER)
Admission: RE | Admit: 2012-08-07 | Discharge: 2012-08-07 | Disposition: A | Discharge status: Home / Self Care | Payer: Medicare Other | Source: Ambulatory Visit | Attending: Vascular Surgery | Admitting: Vascular Surgery

## 2012-08-07 ENCOUNTER — Other Ambulatory Visit: Payer: Self-pay

## 2012-08-07 ENCOUNTER — Other Ambulatory Visit: Payer: Self-pay | Admitting: *Deleted

## 2012-08-07 ENCOUNTER — Encounter (INDEPENDENT_AMBULATORY_CARE_PROVIDER_SITE_OTHER): Payer: Medicare Other | Admitting: *Deleted

## 2012-08-07 DIAGNOSIS — Z79899 Other long term (current) drug therapy: Secondary | ICD-10-CM | POA: Insufficient documentation

## 2012-08-07 DIAGNOSIS — F17200 Nicotine dependence, unspecified, uncomplicated: Secondary | ICD-10-CM | POA: Diagnosis present

## 2012-08-07 DIAGNOSIS — R5381 Other malaise: Secondary | ICD-10-CM | POA: Diagnosis not present

## 2012-08-07 DIAGNOSIS — I70229 Atherosclerosis of native arteries of extremities with rest pain, unspecified extremity: Secondary | ICD-10-CM | POA: Diagnosis not present

## 2012-08-07 DIAGNOSIS — G609 Hereditary and idiopathic neuropathy, unspecified: Secondary | ICD-10-CM | POA: Diagnosis present

## 2012-08-07 DIAGNOSIS — I6529 Occlusion and stenosis of unspecified carotid artery: Secondary | ICD-10-CM | POA: Diagnosis present

## 2012-08-07 DIAGNOSIS — I509 Heart failure, unspecified: Secondary | ICD-10-CM | POA: Diagnosis not present

## 2012-08-07 DIAGNOSIS — I999 Unspecified disorder of circulatory system: Secondary | ICD-10-CM | POA: Diagnosis not present

## 2012-08-07 DIAGNOSIS — Z7982 Long term (current) use of aspirin: Secondary | ICD-10-CM

## 2012-08-07 DIAGNOSIS — I701 Atherosclerosis of renal artery: Secondary | ICD-10-CM | POA: Insufficient documentation

## 2012-08-07 DIAGNOSIS — I441 Atrioventricular block, second degree: Secondary | ICD-10-CM | POA: Diagnosis not present

## 2012-08-07 DIAGNOSIS — I440 Atrioventricular block, first degree: Secondary | ICD-10-CM | POA: Diagnosis present

## 2012-08-07 DIAGNOSIS — I252 Old myocardial infarction: Secondary | ICD-10-CM

## 2012-08-07 DIAGNOSIS — I7092 Chronic total occlusion of artery of the extremities: Secondary | ICD-10-CM | POA: Insufficient documentation

## 2012-08-07 DIAGNOSIS — K560 Paralytic ileus: Secondary | ICD-10-CM | POA: Diagnosis present

## 2012-08-07 DIAGNOSIS — R0609 Other forms of dyspnea: Secondary | ICD-10-CM | POA: Diagnosis not present

## 2012-08-07 DIAGNOSIS — Z452 Encounter for adjustment and management of vascular access device: Secondary | ICD-10-CM | POA: Diagnosis not present

## 2012-08-07 DIAGNOSIS — J4489 Other specified chronic obstructive pulmonary disease: Secondary | ICD-10-CM | POA: Diagnosis present

## 2012-08-07 DIAGNOSIS — I129 Hypertensive chronic kidney disease with stage 1 through stage 4 chronic kidney disease, or unspecified chronic kidney disease: Secondary | ICD-10-CM | POA: Diagnosis present

## 2012-08-07 DIAGNOSIS — Z951 Presence of aortocoronary bypass graft: Secondary | ICD-10-CM

## 2012-08-07 DIAGNOSIS — R059 Cough, unspecified: Secondary | ICD-10-CM | POA: Diagnosis not present

## 2012-08-07 DIAGNOSIS — I7409 Other arterial embolism and thrombosis of abdominal aorta: Secondary | ICD-10-CM | POA: Diagnosis present

## 2012-08-07 DIAGNOSIS — E785 Hyperlipidemia, unspecified: Secondary | ICD-10-CM | POA: Insufficient documentation

## 2012-08-07 DIAGNOSIS — Z01810 Encounter for preprocedural cardiovascular examination: Secondary | ICD-10-CM | POA: Diagnosis not present

## 2012-08-07 DIAGNOSIS — J449 Chronic obstructive pulmonary disease, unspecified: Secondary | ICD-10-CM | POA: Diagnosis present

## 2012-08-07 DIAGNOSIS — I2510 Atherosclerotic heart disease of native coronary artery without angina pectoris: Secondary | ICD-10-CM | POA: Diagnosis present

## 2012-08-07 DIAGNOSIS — M7989 Other specified soft tissue disorders: Secondary | ICD-10-CM | POA: Diagnosis not present

## 2012-08-07 DIAGNOSIS — J984 Other disorders of lung: Secondary | ICD-10-CM | POA: Diagnosis not present

## 2012-08-07 DIAGNOSIS — K5900 Constipation, unspecified: Secondary | ICD-10-CM | POA: Diagnosis not present

## 2012-08-07 DIAGNOSIS — D500 Iron deficiency anemia secondary to blood loss (chronic): Secondary | ICD-10-CM | POA: Diagnosis present

## 2012-08-07 DIAGNOSIS — I714 Abdominal aortic aneurysm, without rupture, unspecified: Principal | ICD-10-CM | POA: Diagnosis present

## 2012-08-07 DIAGNOSIS — I10 Essential (primary) hypertension: Secondary | ICD-10-CM | POA: Insufficient documentation

## 2012-08-07 DIAGNOSIS — F411 Generalized anxiety disorder: Secondary | ICD-10-CM | POA: Diagnosis present

## 2012-08-07 DIAGNOSIS — Z8673 Personal history of transient ischemic attack (TIA), and cerebral infarction without residual deficits: Secondary | ICD-10-CM | POA: Insufficient documentation

## 2012-08-07 DIAGNOSIS — I739 Peripheral vascular disease, unspecified: Secondary | ICD-10-CM

## 2012-08-07 DIAGNOSIS — N189 Chronic kidney disease, unspecified: Secondary | ICD-10-CM | POA: Diagnosis present

## 2012-08-07 DIAGNOSIS — E8779 Other fluid overload: Secondary | ICD-10-CM | POA: Diagnosis present

## 2012-08-07 DIAGNOSIS — I743 Embolism and thrombosis of arteries of the lower extremities: Secondary | ICD-10-CM | POA: Diagnosis not present

## 2012-08-07 DIAGNOSIS — I70219 Atherosclerosis of native arteries of extremities with intermittent claudication, unspecified extremity: Secondary | ICD-10-CM | POA: Diagnosis not present

## 2012-08-07 DIAGNOSIS — I69959 Hemiplegia and hemiparesis following unspecified cerebrovascular disease affecting unspecified side: Secondary | ICD-10-CM

## 2012-08-07 DIAGNOSIS — I69998 Other sequelae following unspecified cerebrovascular disease: Secondary | ICD-10-CM | POA: Diagnosis not present

## 2012-08-07 DIAGNOSIS — N170 Acute kidney failure with tubular necrosis: Secondary | ICD-10-CM | POA: Diagnosis not present

## 2012-08-07 DIAGNOSIS — D62 Acute posthemorrhagic anemia: Secondary | ICD-10-CM | POA: Diagnosis not present

## 2012-08-07 DIAGNOSIS — I723 Aneurysm of iliac artery: Secondary | ICD-10-CM | POA: Diagnosis present

## 2012-08-07 DIAGNOSIS — I708 Atherosclerosis of other arteries: Secondary | ICD-10-CM | POA: Insufficient documentation

## 2012-08-07 DIAGNOSIS — M79609 Pain in unspecified limb: Secondary | ICD-10-CM | POA: Diagnosis not present

## 2012-08-07 DIAGNOSIS — Z5189 Encounter for other specified aftercare: Secondary | ICD-10-CM | POA: Diagnosis not present

## 2012-08-07 DIAGNOSIS — J9819 Other pulmonary collapse: Secondary | ICD-10-CM | POA: Diagnosis not present

## 2012-08-07 DIAGNOSIS — Z01811 Encounter for preprocedural respiratory examination: Secondary | ICD-10-CM | POA: Diagnosis not present

## 2012-08-07 DIAGNOSIS — R062 Wheezing: Secondary | ICD-10-CM | POA: Diagnosis not present

## 2012-08-07 HISTORY — PX: ABDOMINAL AORTAGRAM: SHX5454

## 2012-08-07 LAB — URINALYSIS, ROUTINE W REFLEX MICROSCOPIC
Bilirubin Urine: NEGATIVE
Glucose, UA: NEGATIVE mg/dL
Ketones, ur: NEGATIVE mg/dL
Leukocytes, UA: NEGATIVE
Protein, ur: NEGATIVE mg/dL

## 2012-08-07 LAB — POCT I-STAT, CHEM 8
Creatinine, Ser: 1.6 mg/dL — ABNORMAL HIGH (ref 0.50–1.35)
HCT: 43 % (ref 39.0–52.0)
Hemoglobin: 14.6 g/dL (ref 13.0–17.0)
Sodium: 140 mEq/L (ref 135–145)
TCO2: 26 mmol/L (ref 0–100)

## 2012-08-07 LAB — COMPREHENSIVE METABOLIC PANEL
ALT: 20 U/L (ref 0–53)
AST: 27 U/L (ref 0–37)
Albumin: 3.5 g/dL (ref 3.5–5.2)
Calcium: 8.9 mg/dL (ref 8.4–10.5)
Sodium: 135 mEq/L (ref 135–145)
Total Protein: 7.1 g/dL (ref 6.0–8.3)

## 2012-08-07 LAB — BLOOD GAS, ARTERIAL
Drawn by: 344381
FIO2: 0.21 %
O2 Saturation: 93.7 %
Patient temperature: 98.6
pO2, Arterial: 68.1 mmHg — ABNORMAL LOW (ref 80.0–100.0)

## 2012-08-07 LAB — CBC
MCH: 29.9 pg (ref 26.0–34.0)
MCHC: 32.3 g/dL (ref 30.0–36.0)
Platelets: 189 10*3/uL (ref 150–400)
RBC: 4.75 MIL/uL (ref 4.22–5.81)

## 2012-08-07 LAB — URINE MICROSCOPIC-ADD ON

## 2012-08-07 SURGERY — ABDOMINAL AORTAGRAM
Anesthesia: LOCAL

## 2012-08-07 MED ORDER — SODIUM CHLORIDE 0.9 % IV SOLN
INTRAVENOUS | Status: DC
  Administered 2012-08-07: 12:00:00 via INTRAVENOUS

## 2012-08-07 MED ORDER — LIDOCAINE HCL (PF) 1 % IJ SOLN
INTRAMUSCULAR | Status: AC
  Filled 2012-08-07: qty 30

## 2012-08-07 MED ORDER — LABETALOL HCL 5 MG/ML IV SOLN
INTRAVENOUS | Status: AC
  Filled 2012-08-07: qty 4

## 2012-08-07 MED ORDER — ONDANSETRON HCL 4 MG/2ML IJ SOLN: 4.0000 mg | Freq: Four times a day (QID) | INTRAMUSCULAR | Status: DC | PRN

## 2012-08-07 MED ORDER — OXYCODONE-ACETAMINOPHEN 5-325 MG PO TABS: 1.0000 | ORAL_TABLET | ORAL | Status: DC | PRN

## 2012-08-07 MED ORDER — ACETAMINOPHEN 325 MG PO TABS: 650.0000 mg | ORAL_TABLET | ORAL | Status: DC | PRN

## 2012-08-07 MED ORDER — OXYCODONE-ACETAMINOPHEN 5-325 MG PO TABS
ORAL_TABLET | ORAL | Status: AC
  Administered 2012-08-07: 2 via ORAL
  Filled 2012-08-07: qty 2

## 2012-08-07 MED ORDER — HEPARIN (PORCINE) IN NACL 2-0.9 UNIT/ML-% IJ SOLN
INTRAMUSCULAR | Status: AC
  Filled 2012-08-07: qty 1000

## 2012-08-07 MED ORDER — MUPIROCIN 2 % EX OINT
TOPICAL_OINTMENT | CUTANEOUS | Status: AC
  Filled 2012-08-07: qty 22

## 2012-08-07 MED ORDER — SODIUM CHLORIDE 0.9 % IV SOLN: 1.0000 mL/kg/h | INTRAVENOUS | Status: DC

## 2012-08-07 MED ORDER — LABETALOL HCL 5 MG/ML IV SOLN: 10.0000 mg | INTRAVENOUS | Status: DC | PRN

## 2012-08-07 MED ORDER — HYDROCODONE-ACETAMINOPHEN 5-325 MG PO TABS
ORAL_TABLET | ORAL | Status: DC
Start: 2012-08-07 — End: 2012-08-09

## 2012-08-07 NOTE — Pre-Procedure Instructions (Signed)
TAMARA KENYON  08/07/2012   Your procedure is scheduled on:  Friday, June 6th  Report to [facility] Short Stay Center at 0730 AM. Come to main entrance "A" and go to east elevators up to 3rd floor. Check in at short stay desk.  Call this number if you have problems the morning of surgery: [phone]   Remember:   Do not eat food or drink liquids after midnight.   Take these medicines the morning of surgery with A SIP OF WATER: coreg, vicodin if needed, detrol,    Do not wear jewelry, make-up or nail polish.  Do not wear lotions, powders, or perfumes. You may wear deodorant.  Do not shave 48 hours prior to surgery. Men may shave face and neck.  Do not bring valuables to the hospital.  [facility] is not responsible   for any belongings or valuables.  Contacts, dentures or bridgework may not be worn into surgery.  Leave suitcase in the car. After surgery it may be brought to your room.  For patients admitted to the hospital, checkout time is 11:00 AM the day of discharge.   Patients discharged the day of surgery will not be allowed to drive home.  Special Instructions: Shower using CHG 2 nights before surgery and the night before surgery.  If you shower the day of surgery use CHG.  Use special wash - you have one bottle of CHG for all showers.  You should use approximately 1/3 of the bottle for each shower.   Please read over the following fact sheets that you were given: Pain Booklet, Coughing and Deep Breathing, Blood Transfusion Information, MRSA Information and Surgical Site Infection Prevention

## 2012-08-07 NOTE — Progress Notes (Signed)
Rec'd phone call from Dr. Arbie Cookey with verbal order to phone in Hydrocodone-Acetaminophen 5/325 mg 1-2 q 6 hrs. Prn/ pain/ #20; no refills;  phone call to [facility] with new prescription.

## 2012-08-07 NOTE — Consult Note (Signed)
CARDIOLOGY CONSULT NOTE    Patient ID: [name] [name] MRN: [id number] DOB/AGE: 1931-11-05 79 y.o.  Admit date: 08/07/2012 Referring Physician:  Early Primary Physician: Pearson Grippe, MD Primary Cardiologist:  Myrtis Ser Reason for Consultation:  Pre op Clearance   Active Problems:   * No active hospital problems. *   HPI:  79 yo with known neuropathy, PVD and LE isschemia.  He has a AAA that has increased in size to 5.3 and has limb threatening ischemia in the RLE.  Needs aortobifem bypass for limb salvage.  History of CABG in 2005.  EF presevered and nuclear study 08/28/11 with small fixed inferobasal and apical infarct no ischemia EF 60%  Patient still smoking some. Activity limited by neuropathy and leg pain.  No chest pain palpitations or syncope.  Last seen by Dr Myrtis Ser in June and cardiac condition thought ot be stable.  Had angiogram today.  BP elevated and needed labatolol.    All other systems reviewed and negative except as noted above  Past Medical History  Diagnosis Date  . HTN (hypertension)   . Dyslipidemia   . CVA (cerebral vascular accident)     1997, affected right side  . AAA (abdominal aortic aneurysm)     Also aneurysms of the infrarenal aorta and iliac arteries.. followed Dr. Edilia Bo  . Renal artery stenosis     Noted, 2005, medical therapy  . Tobacco abuse   . CAD (coronary artery disease)     Nuclear, August 28, 2011, EF 60%, small fixed defect at the apex consistent with apical thinning and moderate fixed inferobasal defect compatible with prior infarct, no ischemia  . Hx of CABG     2005  . Ejection fraction     Normal,, 2005,( no echo data as of May, 201 2)  . Neuropathy     Right foot  . Myocardial infarction 2005  . Leg pain   . Abdominal pain   . Hyperlipidemia   . Nervousness   . Peripheral vascular disease   . Carotid artery occlusion     No family history on file.  Father with premature CAD  No vascular disease on mothers side  History   Social  History  . Marital Status: Married    Spouse Name: N/A    Number of Children: N/A  . Years of Education: N/A   Occupational History  . retired    Social History Main Topics  . Smoking status: Current Every Day Smoker -- 0.50 packs/day for 50 years    Types: Cigarettes  . Smokeless tobacco: Not on file  . Alcohol Use: Yes     Comment: occasionally  . Drug Use: No  . Sexually Active: Not on file   Other Topics Concern  . Not on file   Social History Narrative  . No narrative on file    Past Surgical History  Procedure Laterality Date  . Coronary artery bypass graft  2005    x4  . Spine surgery  1977    Lumbar HNP surgery       . sodium chloride 100 mL/hr at 08/07/12 1217    Physical Exam: Blood pressure 147/76, pulse 53, temperature 97.9 F (36.6 C), temperature source Oral, resp. rate 20, height 5\' 10"  (1.778 m), weight 200 lb (90.719 kg), SpO2 97.00%.    Affect appropriate Chronically ill white male HEENT: normal Neck supple with no adenopathy JVP normal no bruits no thyromegaly Lungs bronchitic sounds rhonchi  no wheezing and good  diaphragmatic motion Heart:  S1/S2 no murmur, no rub, gallop or click PMI normal Abdomen: benighn, BS positve, no tenderness, AAA not tender no bruit.  No HSM or HJR Having left femoral sheath pulled Obvious ischemia in RLE No edema Neuro non-focal Skin warm and dry No muscular weakness   Labs:   Lab Results  Component Value Date   WBC 9.1 02/03/2011   HGB 14.6 08/07/2012   HCT 43.0 08/07/2012   MCV 94.1 02/03/2011   PLT 274 02/03/2011    Recent Labs Lab 08/07/12 1218  NA 140  K 4.2  CL 105  BUN 38*  CREATININE 1.60*  GLUCOSE 118*   No current facility-administered medications on file prior to encounter.   Current Outpatient Prescriptions on File Prior to Encounter  Medication Sig Dispense Refill  . carvedilol (COREG) 25 MG tablet Take 25 mg by mouth daily.        Marland Kitchen aspirin EC 81 MG tablet Take 162 mg by mouth  daily.        Marland Kitchen atorvastatin (LIPITOR) 40 MG tablet Take 40 mg by mouth daily.        . chlorthalidone (HYGROTON) 25 MG tablet Take 25 mg by mouth every other day.        . cholecalciferol (VITAMIN D) 1000 UNITS tablet Take 1,000 Units by mouth daily.        Marland Kitchen lisinopril (PRINIVIL,ZESTRIL) 10 MG tablet Take 1 tablet (10 mg total) by mouth daily.  30 tablet  1  . pioglitazone (ACTOS) 15 MG tablet Take 15 mg by mouth daily.        Marland Kitchen tolterodine (DETROL LA) 4 MG 24 hr capsule Take 4 mg by mouth every other day.        . traMADol (ULTRAM) 50 MG tablet Take 50 mg by mouth every 6 (six) hours as needed. For pain      . vitamin B-12 (CYANOCOBALAMIN) 100 MCG tablet Take 50 mcg by mouth daily.        Marland Kitchen zolpidem (AMBIEN) 10 MG tablet Take 10 mg by mouth at bedtime.            Radiology: No results found.  EKG:  08/28/11 SR rate 74 first degree LAD   ASSESSMENT AND PLAN:  CAD:  Clear to have aortobifem bypass Friday.  No chest pain and low risk myovue 6/13.  Continue beta blocker including morning of surgery CABG 2005 with preserved EF HTN:  Continue 3 drug RX may need hydralizine post op.  History of RAS but renals patent on angio today Chol:  Continue statin COPD:  Needs to quit smoking with severe vascular diseae Check preop CXR would not appear to be prohibitive Of surgery  Signed: Charlton Haws 08/07/2012, 2:54 PM

## 2012-08-07 NOTE — Op Note (Signed)
OPERATIVE REPORT  DATE OF SURGERY: 08/07/2012  PATIENT: [name] [name], 79 y.o. male MRN: [id number]  DOB: [birth year]  PRE-OPERATIVE DIAGNOSIS: Severe ischemia right foot with known 5.3 cm infrarenal abdominal aortic aneurysm  POST-OPERATIVE DIAGNOSIS:  Same  PROCEDURE: Aortogram with bilateral lower extremity runoff  SURGEON:  Gretta Began, M.D.   ANESTHESIA:  1% lidocaine local  EBL: Minimal ml     BLOOD ADMINISTERED: None  DRAINS: None  SPECIMEN: None  COUNTS CORRECT:  YES  PLAN OF CARE: Holding area   PATIENT DISPOSITION:  PACU - hemodynamically stable  PROCEDURE DETAILS: Patient is a complex 79 year old gentleman with a known history of infrarenal and bilateral iliac artery aneurysms. For this reason he does have been 8 cm in our office approximately 6 months ago. The patient presents today with severe ischemia of his right foot that's been going on for several weeks and has been progressive. He has rest pain and difficulty walking secondary to this. He is taken to the peripheral vascular cath lab for arteriography.  The right and left groins were prepped and draped in usual sterile fashion. There was a faint left femoral pulse and absent right femoral pulse. Using ultrasound visualization the left common femoral artery was entered with a guidewire using an 18-gauge needle and the Seldinger technique. The wire had some difficulty passing the iliac segment but then passed into the suprarenal aorta. A 5 French sheath was passed over the guidewire and a pigtail catheter is positioned level of the suprarenal aorta. AP projection was undertaken. This revealed widely patent single renal arteries bilaterally. The flow channel through the aorta was normal with apparent mural thrombus in the aneurysm. There was a high-grade stenosis with an irregular plaque in the junction of the common and external iliac artery there was no appreciable flow through the internal iliac artery on the left.  Next the pigtail catheter was withdrawn down below the level renal arteries and runoff was obtained this showed a large profundus femoris artery. There was on the left. There was patency of the superficial femoral artery with occlusion at the adductor canal. There was reconstitution of the popliteal artery at the knee with 3 vessel runoff. The to the right common femoral artery occlusion, there was a poor contrast visualization on the right. This did show reconstitution of the common femoral artery at the groin with a calcified diseased but patent superficial femoral and profundus femoris artery. There appeared to be occlusion of the right superficial femoral artery at the adductor canal with reconstitution at the knee. Additional injection was undertaken with delayed views at the right knee this did show apparent patency of the anterior tibial and peroneal arteries. Patient see the anatomy a Kumpe cage was transferred to the holding area in stable condition  Findings #1 widely patent single renal arteries bilaterally #2 right common iliac artery occlusion at its origin with reconstitution of the right common femoral artery. #3 occlusion of the distal right superficial femoral artery with reconstitution of the below-knee popliteal artery. Probable 2 vessel runoff to the anterior tibial and peroneal artery #4 left common iliac artery stenosis with occlusion of internal iliac artery on the left #5 superficial femoral artery occlusion at the mid thigh with reconstitution at the adductor canal and three-vessel runoff    Gretta Began, M.D. 08/07/2012 3:15 PM

## 2012-08-07 NOTE — H&P (Signed)
[facility] SHORT STAY H&P  CC: severe pain in right foot for a week in Pt with known PAD   HPI: [name] [name] is a 79 y.o. male with HX AAA, CAD S/P CABG, HTN and CVA, and renal artery stenosis who is being followed in our office for PAD in right leg and AAA.  Pt states he has had claudication in the right leg for aprox 2 years.  He developed worsening pain in the right leg with rest and night pain over the last week. He was seen in our office  today and ABI's were now 0.4 on the right.  He is admitted for Angiogram by Dr. Early  Past Medical History  Diagnosis Date  . HTN (hypertension)   . Dyslipidemia   . CVA (cerebral vascular accident)     1997, affected right side  . AAA (abdominal aortic aneurysm)     Also aneurysms of the infrarenal aorta and iliac arteries.. followed Dr. Dickson  . Renal artery stenosis     Noted, 2005, medical therapy  . Tobacco abuse   . CAD (coronary artery disease)     Nuclear, August 28, 2011, EF 60%, small fixed defect at the apex consistent with apical thinning and moderate fixed inferobasal defect compatible with prior infarct, no ischemia  . Hx of CABG     2005  . Ejection fraction     Normal,, 2005,( no echo data as of May, 201 2)  . Neuropathy     Right foot  . Myocardial infarction 2005  . Leg pain   . Abdominal pain   . Hyperlipidemia   . Nervousness   . Peripheral vascular disease   . Carotid artery occlusion     Social HX History  Substance Use Topics  . Smoking status: Current Every Day Smoker -- 0.50 packs/day for 50 years    Types: Cigarettes  . Smokeless tobacco: Not on file  . Alcohol Use: Yes     Comment: occasionally    Allergies No Known Allergies  Medications aspirin EC 81 MG tablet  Take 162 mg by mouth daily.  .  atorvastatin (LIPITOR) 40 MG tablet  Take 40 mg by mouth daily.  .  carvedilol (COREG) 25 MG tablet  Take 25 mg by mouth daily.  .  chlorthalidone (HYGROTON) 25 MG tablet  Take  25 mg by mouth every other day.  .  cholecalciferol (VITAMIN D) 1000 UNITS tablet  Take 1,000 Units by mouth daily.  .  lisinopril (PRINIVIL,ZESTRIL) 10 MG tablet  Take 10 mg by mouth daily.  .  pioglitazone (ACTOS) 15 MG tablet  Take 15 mg by mouth daily.  .  tolterodine (DETROL LA) 4 MG 24 hr capsule  Take 4 mg by mouth every other day.  .  traMADol (ULTRAM) 50 MG tablet  Take 50 mg by mouth every 6 (six) hours as needed. For pain  .  vitamin B-12 (CYANOCOBALAMIN) 100 MCG tablet  Take 50 mcg by mouth daily.  .  zolpidem (AMBIEN) 10 MG tablet  Take 10 mg by mouth at bedtime.   Labs Lab Results  Component Value Date   WBC 9.1 02/03/2011   HGB 13.2 02/03/2011   HCT 41.3 02/03/2011   MCV 94.1 02/03/2011   PLT 274 02/03/2011   I stat 8 -  Results for [name], [name] (MRN [id number]) as of 08/07/2012 12:47  Ref. Range 08/07/2012 12:18  Sodium Latest Range: 135-145 mEq/L 140  Potassium Latest   Range: 3.5-5.1 mEq/L 4.2  Chloride Latest Range: 96-112 mEq/L 105  BUN Latest Range: 6-23 mg/dL 38 (H)  Creatinine Latest Range: 0.50-1.35 mg/dL 1.60 (H)  Glucose Latest Range: 70-99 mg/dL 118 (H)  Calcium Ionized Latest Range: 1.13-1.30 mmol/L 1.15  Hemoglobin Latest Range: 13.0-17.0 g/dL 14.6  HCT Latest Range: 39.0-52.0 % 43.0    PHYSICAL EXAM  Filed Vitals:   08/07/12 1132  BP: 147/76  Pulse: 53  Temp: 97.9 F (36.6 C)  Resp: 20    General:  WDWN in NAD HENT: WNL Eyes: Pupils equal Pulmonary: normal non-labored breathing , without Rales, rhonchi,  wheezing Cardiac: RRR, no Murmurs No Carotid Bruits Vascular Exam/Pulses:  2+ radial pulses bilat. No doppler signal in PT or DP on right Monophasic DP on left  Abd soft, no masses noted Extremities with ruberous ischemic changes, no Gangrene , no open wounds;  Neuro A&O x 3; good sensation; motion in all extremities  Impression: Acute on chronic right LE ischemia with worsening ABI's  Plan: Angiogram to assess for  possible intervention RLE Jeanifer Halliday J 08/07/2012 12:08 PM   

## 2012-08-08 MED ORDER — DEXTROSE 5 % IV SOLN
1.5000 g | INTRAVENOUS | Status: AC
  Administered 2012-08-09: 1.5 g via INTRAVENOUS
  Filled 2012-08-08: qty 1.5

## 2012-08-08 NOTE — Progress Notes (Signed)
Pt called to get results of PCR screen. Pt informed that he is positive for staph and instructed on use of mupirocin ointment. Instructed him to administer 2 doses today (one now and one at bedtime), then one in the morning and bring it with him to the hospital so we can continue to administer it. Pt states okay and denies questions.

## 2012-08-08 NOTE — Progress Notes (Signed)
Anesthesia chart review: Patient is a 79 year old male scheduled for AFBG on 08/09/2012 by Dr. Arbie Cookey. History includes smoking, hypertension, AAA, dyslipidemia, renal artery stenosis, CKD, CVA '97 with right hemiparesis, hyperlipidemia, carotid artery occlusive disease (< 50% RICAS, 50-70% LICAS '10), CAD s/p CABG '05.  Cardiologist is Dr. Myrtis Ser.  He was seen by cardiologist Dr. Eden Emms for preoperative clearance on 08/07/12.  He cleared patient for this procedure since patient was asymptomatic and had a low risk Myoview in June 2013. He did recommend continue beta blocker therapy. He has had some issues with hypertension so hydralazine may be needed post-operatively.  EKG on 08/22/11 showed NSR, first degree AVB, LAD.  Nuclear stress test on 08/28/11 showed: Abnormal stress nuclear study with a mild, small, fixed apical defect and a medium, moderate size , fixed inferobasal defect consistent with apical thinning and a prior inferobasal infarct; no ischemia. LV Ejection Fraction: 60%. LV Wall Motion: NL LV Function; NL Wall Motion.  Echo on 08/11/10 showed: - Left ventricle: The cavity size was normal. Systolic function was vigorous. The estimated ejection fraction was in the range of 65% to 70%. Wall motion was normal; there were no regional wall motion abnormalities. - Left atrium: The atrium was mildly dilated. - Atrial septum: No defect or patent foramen ovale was identified.  His last cardiac cath was in 2005 prior to CABG. Patient needs a CXR on the day of surgery.  Labs noted.  Cr appears stable.  Anticipate that he may proceed as planned.  Velna Ochs [facility] Short Stay Center/Anesthesiology Phone [phone] 08/08/2012 10:02 AM

## 2012-08-09 ENCOUNTER — Encounter (HOSPITAL_COMMUNITY): Payer: Self-pay | Admitting: *Deleted

## 2012-08-09 ENCOUNTER — Encounter (HOSPITAL_COMMUNITY): Payer: Self-pay | Admitting: Vascular Surgery

## 2012-08-09 ENCOUNTER — Encounter (HOSPITAL_COMMUNITY): Payer: Self-pay | Admitting: Pharmacy Technician

## 2012-08-09 ENCOUNTER — Inpatient Hospital Stay (HOSPITAL_COMMUNITY): Payer: Medicare Other

## 2012-08-09 ENCOUNTER — Inpatient Hospital Stay (HOSPITAL_COMMUNITY): Payer: Medicare Other | Admitting: Vascular Surgery

## 2012-08-09 ENCOUNTER — Encounter (HOSPITAL_COMMUNITY)
Admission: RE | Disposition: A | Discharge status: Other Acute Inpatient Hospital | Payer: Self-pay | Source: Ambulatory Visit | Attending: Vascular Surgery

## 2012-08-09 ENCOUNTER — Inpatient Hospital Stay (HOSPITAL_COMMUNITY)
Admission: RE | Admit: 2012-08-09 | Discharge: 2012-08-19 | DRG: 237 | Payer: Medicare Other | Source: Ambulatory Visit | Attending: Vascular Surgery | Admitting: Vascular Surgery

## 2012-08-09 DIAGNOSIS — J441 Chronic obstructive pulmonary disease with (acute) exacerbation: Secondary | ICD-10-CM | POA: Diagnosis present

## 2012-08-09 DIAGNOSIS — Z951 Presence of aortocoronary bypass graft: Secondary | ICD-10-CM

## 2012-08-09 DIAGNOSIS — I714 Abdominal aortic aneurysm, without rupture, unspecified: Secondary | ICD-10-CM

## 2012-08-09 DIAGNOSIS — J984 Other disorders of lung: Secondary | ICD-10-CM | POA: Diagnosis not present

## 2012-08-09 DIAGNOSIS — Z452 Encounter for adjustment and management of vascular access device: Secondary | ICD-10-CM | POA: Diagnosis not present

## 2012-08-09 DIAGNOSIS — I10 Essential (primary) hypertension: Secondary | ICD-10-CM | POA: Diagnosis not present

## 2012-08-09 DIAGNOSIS — K560 Paralytic ileus: Secondary | ICD-10-CM | POA: Diagnosis not present

## 2012-08-09 DIAGNOSIS — F17200 Nicotine dependence, unspecified, uncomplicated: Secondary | ICD-10-CM

## 2012-08-09 DIAGNOSIS — I743 Embolism and thrombosis of arteries of the lower extremities: Secondary | ICD-10-CM

## 2012-08-09 DIAGNOSIS — I7409 Other arterial embolism and thrombosis of abdominal aorta: Secondary | ICD-10-CM | POA: Diagnosis not present

## 2012-08-09 DIAGNOSIS — Z01811 Encounter for preprocedural respiratory examination: Secondary | ICD-10-CM | POA: Diagnosis not present

## 2012-08-09 DIAGNOSIS — I999 Unspecified disorder of circulatory system: Secondary | ICD-10-CM | POA: Diagnosis not present

## 2012-08-09 DIAGNOSIS — R0600 Dyspnea, unspecified: Secondary | ICD-10-CM

## 2012-08-09 DIAGNOSIS — I739 Peripheral vascular disease, unspecified: Secondary | ICD-10-CM | POA: Diagnosis not present

## 2012-08-09 DIAGNOSIS — I70229 Atherosclerosis of native arteries of extremities with rest pain, unspecified extremity: Secondary | ICD-10-CM

## 2012-08-09 DIAGNOSIS — E785 Hyperlipidemia, unspecified: Secondary | ICD-10-CM

## 2012-08-09 DIAGNOSIS — J449 Chronic obstructive pulmonary disease, unspecified: Secondary | ICD-10-CM

## 2012-08-09 DIAGNOSIS — I70219 Atherosclerosis of native arteries of extremities with intermittent claudication, unspecified extremity: Secondary | ICD-10-CM

## 2012-08-09 DIAGNOSIS — Z720 Tobacco use: Secondary | ICD-10-CM

## 2012-08-09 DIAGNOSIS — N170 Acute kidney failure with tubular necrosis: Secondary | ICD-10-CM | POA: Diagnosis not present

## 2012-08-09 DIAGNOSIS — I441 Atrioventricular block, second degree: Secondary | ICD-10-CM

## 2012-08-09 HISTORY — PX: AORTA - BILATERAL FEMORAL ARTERY BYPASS GRAFT: SHX1175

## 2012-08-09 HISTORY — DX: Chronic obstructive pulmonary disease, unspecified: J44.9

## 2012-08-09 LAB — CBC
Hemoglobin: 12.1 g/dL — ABNORMAL LOW (ref 13.0–17.0)
MCH: 30.3 pg (ref 26.0–34.0)
MCH: 31.1 pg (ref 26.0–34.0)
MCHC: 33.6 g/dL (ref 30.0–36.0)
MCV: 92.5 fL (ref 78.0–100.0)
Platelets: 168 10*3/uL (ref 150–400)
Platelets: 157 10*3/uL (ref 150–400)
RBC: 4 MIL/uL — ABNORMAL LOW (ref 4.22–5.81)
RDW: 14.3 % (ref 11.5–15.5)
WBC: 31.5 10*3/uL — ABNORMAL HIGH (ref 4.0–10.5)

## 2012-08-09 LAB — BASIC METABOLIC PANEL
CO2: 21 mEq/L (ref 19–32)
Chloride: 106 mEq/L (ref 96–112)
Glucose, Bld: 138 mg/dL — ABNORMAL HIGH (ref 70–99)
Potassium: 4.1 mEq/L (ref 3.5–5.1)
Sodium: 138 mEq/L (ref 135–145)

## 2012-08-09 LAB — BLOOD GAS, ARTERIAL
O2 Content: 6 L/min
O2 Saturation: 99.3 %
Patient temperature: 98.6
pH, Arterial: 7.311 — ABNORMAL LOW (ref 7.350–7.450)

## 2012-08-09 LAB — APTT: aPTT: 35 seconds (ref 24–37)

## 2012-08-09 LAB — POCT I-STAT 7, (LYTES, BLD GAS, ICA,H+H)
Acid-base deficit: 2 mmol/L (ref 0.0–2.0)
Calcium, Ion: 1.11 mmol/L — ABNORMAL LOW (ref 1.13–1.30)
O2 Saturation: 100 %
Patient temperature: 36.3
Potassium: 3.6 mEq/L (ref 3.5–5.1)
TCO2: 25 mmol/L (ref 0–100)
pCO2 arterial: 39.2 mmHg (ref 35.0–45.0)
pO2, Arterial: 405 mmHg — ABNORMAL HIGH (ref 80.0–100.0)

## 2012-08-09 LAB — GLUCOSE, CAPILLARY: Glucose-Capillary: 107 mg/dL — ABNORMAL HIGH (ref 70–99)

## 2012-08-09 LAB — PROTIME-INR
INR: 1.15 (ref 0.00–1.49)
Prothrombin Time: 14.5 seconds (ref 11.6–15.2)

## 2012-08-09 LAB — PREPARE RBC (CROSSMATCH)

## 2012-08-09 SURGERY — CREATION, BYPASS, ARTERIAL, AORTA TO FEMORAL, BILATERAL, USING GRAFT
Anesthesia: General | Site: Abdomen | Wound class: Clean

## 2012-08-09 MED ORDER — MANNITOL 25 % IV SOLN
INTRAVENOUS | Status: DC | PRN
  Administered 2012-08-09: 25 g via INTRAVENOUS

## 2012-08-09 MED ORDER — MANNITOL 25 % IV SOLN
25.0000 g | INTRAVENOUS | Status: DC
  Filled 2012-08-09: qty 100

## 2012-08-09 MED ORDER — SODIUM CHLORIDE 0.9 % IV SOLN: 500.0000 mL | Freq: Once | INTRAVENOUS | Status: AC | PRN

## 2012-08-09 MED ORDER — ACETAMINOPHEN 325 MG PO TABS: 325.0000 mg | ORAL_TABLET | ORAL | Status: DC | PRN

## 2012-08-09 MED ORDER — SODIUM CHLORIDE 0.9 % IR SOLN
Status: DC | PRN
  Administered 2012-08-09: 11:00:00

## 2012-08-09 MED ORDER — LABETALOL HCL 5 MG/ML IV SOLN
10.0000 mg | INTRAVENOUS | Status: DC | PRN
  Filled 2012-08-09: qty 4

## 2012-08-09 MED ORDER — MIDAZOLAM HCL 2 MG/2ML IJ SOLN
INTRAMUSCULAR | Status: AC
  Administered 2012-08-09: 2 mg
  Filled 2012-08-09: qty 2

## 2012-08-09 MED ORDER — ONDANSETRON HCL 4 MG/2ML IJ SOLN: 4.0000 mg | Freq: Once | INTRAMUSCULAR | Status: DC | PRN

## 2012-08-09 MED ORDER — POTASSIUM CHLORIDE IN NACL 20-0.9 MEQ/L-% IV SOLN
INTRAVENOUS | Status: DC
  Administered 2012-08-09: 100 mL/h via INTRAVENOUS
  Administered 2012-08-10 (×2): via INTRAVENOUS
  Administered 2012-08-11: 100 mL/h via INTRAVENOUS
  Administered 2012-08-12: 02:00:00 via INTRAVENOUS
  Filled 2012-08-09 (×7): qty 1000

## 2012-08-09 MED ORDER — LACTATED RINGERS IV SOLN
INTRAVENOUS | Status: DC | PRN
  Administered 2012-08-09: 10:00:00 via INTRAVENOUS

## 2012-08-09 MED ORDER — OXYCODONE HCL 5 MG/5ML PO SOLN: 5.0000 mg | Freq: Once | ORAL | Status: DC | PRN

## 2012-08-09 MED ORDER — CARVEDILOL 12.5 MG PO TABS
ORAL_TABLET | ORAL | Status: AC
  Filled 2012-08-09: qty 2

## 2012-08-09 MED ORDER — PHENOL 1.4 % MT LIQD: 1.0000 | OROMUCOSAL | Status: DC | PRN

## 2012-08-09 MED ORDER — LACTATED RINGERS IV SOLN
INTRAVENOUS | Status: DC
Start: 2012-08-09 — End: 2012-08-09
  Administered 2012-08-09: 09:00:00 via INTRAVENOUS

## 2012-08-09 MED ORDER — ACETAMINOPHEN 650 MG RE SUPP: 325.0000 mg | RECTAL | Status: DC | PRN

## 2012-08-09 MED ORDER — NITROGLYCERIN IN D5W 200-5 MCG/ML-% IV SOLN: 2.0000 ug/min | INTRAVENOUS | Status: DC

## 2012-08-09 MED ORDER — HYDRALAZINE HCL 20 MG/ML IJ SOLN: 10.0000 mg | INTRAMUSCULAR | Status: DC | PRN

## 2012-08-09 MED ORDER — NEOSTIGMINE METHYLSULFATE 1 MG/ML IJ SOLN
INTRAMUSCULAR | Status: DC | PRN
  Administered 2012-08-09: 5 mg via INTRAVENOUS

## 2012-08-09 MED ORDER — POTASSIUM CHLORIDE CRYS ER 20 MEQ PO TBCR: 20.0000 meq | EXTENDED_RELEASE_TABLET | Freq: Every day | ORAL | Status: DC | PRN

## 2012-08-09 MED ORDER — METOPROLOL TARTRATE 1 MG/ML IV SOLN
2.0000 mg | INTRAVENOUS | Status: DC | PRN
  Administered 2012-08-13: 5 mg via INTRAVENOUS
  Filled 2012-08-09: qty 5

## 2012-08-09 MED ORDER — SODIUM CHLORIDE 0.9 % IV SOLN: INTRAVENOUS | Status: DC

## 2012-08-09 MED ORDER — MORPHINE SULFATE 2 MG/ML IJ SOLN
2.0000 mg | INTRAMUSCULAR | Status: DC | PRN
  Administered 2012-08-09: 2 mg via INTRAVENOUS
  Administered 2012-08-10 – 2012-08-12 (×20): 4 mg via INTRAVENOUS
  Administered 2012-08-12 (×2): 2 mg via INTRAVENOUS
  Administered 2012-08-12 (×2): 4 mg via INTRAVENOUS
  Administered 2012-08-12: 2 mg via INTRAVENOUS
  Administered 2012-08-13 (×2): 4 mg via INTRAVENOUS
  Administered 2012-08-14: 2 mg via INTRAVENOUS
  Filled 2012-08-09 (×2): qty 2
  Filled 2012-08-09: qty 1
  Filled 2012-08-09: qty 2
  Filled 2012-08-09: qty 1
  Filled 2012-08-09 (×5): qty 2
  Filled 2012-08-09: qty 1
  Filled 2012-08-09 (×9): qty 2
  Filled 2012-08-09: qty 1
  Filled 2012-08-09 (×4): qty 2
  Filled 2012-08-09: qty 1
  Filled 2012-08-09 (×4): qty 2
  Filled 2012-08-09: qty 1

## 2012-08-09 MED ORDER — LACTATED RINGERS IV SOLN
INTRAVENOUS | Status: DC | PRN
  Administered 2012-08-09 (×2): via INTRAVENOUS

## 2012-08-09 MED ORDER — OXYCODONE HCL 5 MG PO TABS: 5.0000 mg | ORAL_TABLET | Freq: Once | ORAL | Status: DC | PRN

## 2012-08-09 MED ORDER — PHENYLEPHRINE HCL 10 MG/ML IJ SOLN
30.0000 ug/min | INTRAVENOUS | Status: DC
  Filled 2012-08-09: qty 2

## 2012-08-09 MED ORDER — HEPARIN SODIUM (PORCINE) 1000 UNIT/ML IJ SOLN
INTRAMUSCULAR | Status: DC | PRN
  Administered 2012-08-09: 3000 [IU] via INTRAVENOUS
  Administered 2012-08-09: 9000 [IU] via INTRAVENOUS

## 2012-08-09 MED ORDER — ENOXAPARIN SODIUM 40 MG/0.4ML ~~LOC~~ SOLN
40.0000 mg | SUBCUTANEOUS | Status: DC
  Administered 2012-08-11 – 2012-08-13 (×3): 40 mg via SUBCUTANEOUS
  Filled 2012-08-09 (×4): qty 0.4

## 2012-08-09 MED ORDER — ALBUMIN HUMAN 5 % IV SOLN
INTRAVENOUS | Status: DC | PRN
  Administered 2012-08-09: 14:00:00 via INTRAVENOUS

## 2012-08-09 MED ORDER — HYDROMORPHONE HCL PF 1 MG/ML IJ SOLN
0.2500 mg | INTRAMUSCULAR | Status: DC | PRN
  Administered 2012-08-09 (×2): 0.5 mg via INTRAVENOUS

## 2012-08-09 MED ORDER — HYDROMORPHONE HCL PF 1 MG/ML IJ SOLN
INTRAMUSCULAR | Status: AC
  Filled 2012-08-09: qty 1

## 2012-08-09 MED ORDER — LABETALOL HCL 5 MG/ML IV SOLN
INTRAVENOUS | Status: AC
  Filled 2012-08-09: qty 4

## 2012-08-09 MED ORDER — INSULIN ASPART 100 UNIT/ML ~~LOC~~ SOLN
0.0000 [IU] | Freq: Three times a day (TID) | SUBCUTANEOUS | Status: DC
  Administered 2012-08-10 – 2012-08-14 (×4): 2 [IU] via SUBCUTANEOUS
  Administered 2012-08-15 – 2012-08-16 (×3): 3 [IU] via SUBCUTANEOUS
  Administered 2012-08-17: 2 [IU] via SUBCUTANEOUS
  Administered 2012-08-17: 3 [IU] via SUBCUTANEOUS
  Administered 2012-08-18 – 2012-08-19 (×2): 2 [IU] via SUBCUTANEOUS

## 2012-08-09 MED ORDER — DEXTROSE 5 % IV SOLN
1.5000 g | Freq: Two times a day (BID) | INTRAVENOUS | Status: AC
  Administered 2012-08-09 – 2012-08-10 (×2): 1.5 g via INTRAVENOUS
  Filled 2012-08-09 (×2): qty 1.5

## 2012-08-09 MED ORDER — ONDANSETRON HCL 4 MG/2ML IJ SOLN: 4.0000 mg | Freq: Four times a day (QID) | INTRAMUSCULAR | Status: DC | PRN

## 2012-08-09 MED ORDER — MUPIROCIN 2 % EX OINT
TOPICAL_OINTMENT | Freq: Two times a day (BID) | CUTANEOUS | Status: DC
  Filled 2012-08-09 (×2): qty 22

## 2012-08-09 MED ORDER — PROPOFOL 10 MG/ML IV BOLUS
INTRAVENOUS | Status: DC | PRN
  Administered 2012-08-09: 100 mg via INTRAVENOUS

## 2012-08-09 MED ORDER — MEPERIDINE HCL 25 MG/ML IJ SOLN: 6.2500 mg | INTRAMUSCULAR | Status: DC | PRN

## 2012-08-09 MED ORDER — LACTATED RINGERS IV SOLN
INTRAVENOUS | Status: DC | PRN
  Administered 2012-08-09 (×3): via INTRAVENOUS

## 2012-08-09 MED ORDER — FENTANYL CITRATE 0.05 MG/ML IJ SOLN
INTRAMUSCULAR | Status: AC
  Administered 2012-08-09: 50 ug
  Filled 2012-08-09: qty 2

## 2012-08-09 MED ORDER — METOPROLOL TARTRATE 1 MG/ML IV SOLN
5.0000 mg | Freq: Four times a day (QID) | INTRAVENOUS | Status: DC
  Administered 2012-08-09 – 2012-08-13 (×15): 5 mg via INTRAVENOUS
  Filled 2012-08-09 (×18): qty 5

## 2012-08-09 MED ORDER — MAGNESIUM SULFATE 40 MG/ML IJ SOLN
2.0000 g | Freq: Every day | INTRAMUSCULAR | Status: DC | PRN
  Filled 2012-08-09: qty 50

## 2012-08-09 MED ORDER — DEXTROSE 5 % IV SOLN
10.0000 mg | INTRAVENOUS | Status: DC | PRN
  Administered 2012-08-09: 60 ug/min via INTRAVENOUS

## 2012-08-09 MED ORDER — 0.9 % SODIUM CHLORIDE (POUR BTL) OPTIME
TOPICAL | Status: DC | PRN
  Administered 2012-08-09: 2000 mL
  Administered 2012-08-09: 1000 mL

## 2012-08-09 MED ORDER — CARVEDILOL 25 MG PO TABS: 25.0000 mg | ORAL_TABLET | Freq: Two times a day (BID) | ORAL | Status: DC

## 2012-08-09 MED ORDER — HEMOSTATIC AGENTS (NO CHARGE) OPTIME
TOPICAL | Status: DC | PRN
  Administered 2012-08-09: 1 via TOPICAL

## 2012-08-09 MED ORDER — PROTAMINE SULFATE 10 MG/ML IV SOLN
INTRAVENOUS | Status: DC | PRN
  Administered 2012-08-09: 50 mg via INTRAVENOUS

## 2012-08-09 MED ORDER — ROCURONIUM BROMIDE 100 MG/10ML IV SOLN
INTRAVENOUS | Status: DC | PRN
  Administered 2012-08-09: 10 mg via INTRAVENOUS
  Administered 2012-08-09: 30 mg via INTRAVENOUS
  Administered 2012-08-09: 50 mg via INTRAVENOUS
  Administered 2012-08-09 (×2): 10 mg via INTRAVENOUS
  Administered 2012-08-09: 20 mg via INTRAVENOUS

## 2012-08-09 MED ORDER — LIDOCAINE HCL (CARDIAC) 20 MG/ML IV SOLN
INTRAVENOUS | Status: DC | PRN
  Administered 2012-08-09: 100 mg via INTRAVENOUS

## 2012-08-09 MED ORDER — LABETALOL HCL 5 MG/ML IV SOLN
5.0000 mg | INTRAVENOUS | Status: DC | PRN
  Administered 2012-08-09 (×2): 5 mg via INTRAVENOUS

## 2012-08-09 MED ORDER — SUFENTANIL CITRATE 50 MCG/ML IV SOLN
INTRAVENOUS | Status: DC | PRN
  Administered 2012-08-09: 75 ug via INTRAVENOUS
  Administered 2012-08-09: 25 ug via INTRAVENOUS
  Administered 2012-08-09: 5 ug via INTRAVENOUS
  Administered 2012-08-09: 10 ug via INTRAVENOUS

## 2012-08-09 MED ORDER — ALBUMIN HUMAN 5 % IV SOLN
INTRAVENOUS | Status: DC | PRN
  Administered 2012-08-09: 16:00:00 via INTRAVENOUS

## 2012-08-09 MED ORDER — BISACODYL 10 MG RE SUPP
10.0000 mg | Freq: Every day | RECTAL | Status: DC | PRN
  Filled 2012-08-09: qty 1

## 2012-08-09 MED ORDER — PANTOPRAZOLE SODIUM 40 MG IV SOLR
40.0000 mg | Freq: Every day | INTRAVENOUS | Status: DC
  Administered 2012-08-09 – 2012-08-13 (×5): 40 mg via INTRAVENOUS
  Filled 2012-08-09 (×7): qty 40

## 2012-08-09 MED ORDER — GLYCOPYRROLATE 0.2 MG/ML IJ SOLN
INTRAMUSCULAR | Status: DC | PRN
  Administered 2012-08-09: .6 mg via INTRAVENOUS
  Administered 2012-08-09: 0.2 mg via INTRAVENOUS

## 2012-08-09 SURGICAL SUPPLY — 72 items
BENZOIN TINCTURE PRP APPL 2/3 (GAUZE/BANDAGES/DRESSINGS) ×2 IMPLANT
BLADE SURG ROTATE 9660 (MISCELLANEOUS) ×2 IMPLANT
CANISTER SUCTION 2500CC (MISCELLANEOUS) ×2 IMPLANT
CANNULA VESSEL W/WING WO/VALVE (CANNULA) ×2 IMPLANT
CLIP LIGATING EXTRA MED SLVR (CLIP) ×2 IMPLANT
CLIP LIGATING EXTRA SM BLUE (MISCELLANEOUS) ×2 IMPLANT
CLOTH BEACON ORANGE TIMEOUT ST (SAFETY) ×2 IMPLANT
COVER SURGICAL LIGHT HANDLE (MISCELLANEOUS) ×2 IMPLANT
COVER TABLE BACK 60X90 (DRAPES) ×2 IMPLANT
DRAPE BILATERAL SPLIT (DRAPES) IMPLANT
DRAPE CV SPLIT W-CLR ANES SCRN (DRAPES) IMPLANT
DRAPE WARM FLUID 44X44 (DRAPE) ×2 IMPLANT
DRSG COVADERM 4X14 (GAUZE/BANDAGES/DRESSINGS) IMPLANT
DRSG COVADERM 4X6 (GAUZE/BANDAGES/DRESSINGS) ×4 IMPLANT
ELECT BLADE 4.0 EZ CLEAN MEGAD (MISCELLANEOUS)
ELECT CAUTERY BLADE 6.4 (BLADE) ×2 IMPLANT
ELECT REM PT RETURN 9FT ADLT (ELECTROSURGICAL) ×4
ELECTRODE BLDE 4.0 EZ CLN MEGD (MISCELLANEOUS) IMPLANT
ELECTRODE REM PT RTRN 9FT ADLT (ELECTROSURGICAL) ×2 IMPLANT
GLOVE BIOGEL PI IND STRL 6.5 (GLOVE) ×5 IMPLANT
GLOVE BIOGEL PI IND STRL 7.0 (GLOVE) ×1 IMPLANT
GLOVE BIOGEL PI IND STRL 7.5 (GLOVE) ×2 IMPLANT
GLOVE BIOGEL PI INDICATOR 6.5 (GLOVE) ×5
GLOVE BIOGEL PI INDICATOR 7.0 (GLOVE) ×1
GLOVE BIOGEL PI INDICATOR 7.5 (GLOVE) ×2
GLOVE ECLIPSE 6.5 STRL STRAW (GLOVE) ×8 IMPLANT
GLOVE ECLIPSE 7.0 STRL STRAW (GLOVE) ×4 IMPLANT
GLOVE ECLIPSE 7.5 STRL STRAW (GLOVE) ×2 IMPLANT
GLOVE SS BIOGEL STRL SZ 6.5 (GLOVE) ×1 IMPLANT
GLOVE SS BIOGEL STRL SZ 7.5 (GLOVE) ×2 IMPLANT
GLOVE SUPERSENSE BIOGEL SZ 6.5 (GLOVE) ×1
GLOVE SUPERSENSE BIOGEL SZ 7.5 (GLOVE) ×2
GOWN STRL NON-REIN LRG LVL3 (GOWN DISPOSABLE) ×10 IMPLANT
GOWN STRL REIN XL XLG (GOWN DISPOSABLE) ×6 IMPLANT
GRAFT HEMASHIELD 18X9MM (Vascular Products) ×2 IMPLANT
HEMOSTAT SNOW SURGICEL 2X4 (HEMOSTASIS) ×2 IMPLANT
INSERT FOGARTY 61MM (MISCELLANEOUS) IMPLANT
INSERT FOGARTY SM (MISCELLANEOUS) IMPLANT
KIT BASIN OR (CUSTOM PROCEDURE TRAY) ×2 IMPLANT
KIT ROOM TURNOVER OR (KITS) ×2 IMPLANT
LOOP VESSEL MINI RED (MISCELLANEOUS) ×2 IMPLANT
NS IRRIG 1000ML POUR BTL (IV SOLUTION) ×6 IMPLANT
PACK AORTA (CUSTOM PROCEDURE TRAY) ×2 IMPLANT
PAD ARMBOARD 7.5X6 YLW CONV (MISCELLANEOUS) ×4 IMPLANT
PENCIL BUTTON HOLSTER BLD 10FT (ELECTRODE) ×2 IMPLANT
SPECIMEN JAR MEDIUM (MISCELLANEOUS) IMPLANT
SPONGE LAP 18X18 X RAY DECT (DISPOSABLE) IMPLANT
STAPLER VISISTAT 35W (STAPLE) ×2 IMPLANT
STRIP CLOSURE SKIN 1/2X4 (GAUZE/BANDAGES/DRESSINGS) ×4 IMPLANT
SUT ETHIBOND 5 LR DA (SUTURE) IMPLANT
SUT PDS AB 1 TP1 54 (SUTURE) ×4 IMPLANT
SUT PROLENE 3 0 SH1 36 (SUTURE) ×8 IMPLANT
SUT PROLENE 5 0 C 1 24 (SUTURE) ×8 IMPLANT
SUT PROLENE 5 0 C 1 36 (SUTURE) ×4 IMPLANT
SUT PROLENE 6 0 CC (SUTURE) ×4 IMPLANT
SUT SILK 2 0 (SUTURE) ×1
SUT SILK 2 0 SH CR/8 (SUTURE) ×2 IMPLANT
SUT SILK 2 0 TIES 17X18 (SUTURE) ×1
SUT SILK 2-0 18XBRD TIE 12 (SUTURE) ×1 IMPLANT
SUT SILK 2-0 18XBRD TIE BLK (SUTURE) ×1 IMPLANT
SUT SILK 3 0 (SUTURE) ×1
SUT SILK 3 0 TIES 17X18 (SUTURE) ×1
SUT SILK 3-0 18XBRD TIE 12 (SUTURE) ×1 IMPLANT
SUT SILK 3-0 18XBRD TIE BLK (SUTURE) ×1 IMPLANT
SUT VIC AB 2-0 CT1 36 (SUTURE) ×8 IMPLANT
SUT VIC AB 3-0 SH 27 (SUTURE) ×2
SUT VIC AB 3-0 SH 27X BRD (SUTURE) ×2 IMPLANT
TOWEL BLUE STERILE X RAY DET (MISCELLANEOUS) ×4 IMPLANT
TOWEL OR 17X24 6PK STRL BLUE (TOWEL DISPOSABLE) ×4 IMPLANT
TOWEL OR 17X26 10 PK STRL BLUE (TOWEL DISPOSABLE) ×4 IMPLANT
TRAY FOLEY CATH 14FRSI W/METER (CATHETERS) ×2 IMPLANT
WATER STERILE IRR 1000ML POUR (IV SOLUTION) ×2 IMPLANT

## 2012-08-09 NOTE — Anesthesia Postprocedure Evaluation (Signed)
Anesthesia Post Note  Patient: [name] [name]  Procedure(s) Performed: Procedure(s) (LRB): AORTA BIFEMORAL BYPASS GRAFT (N/A)  Anesthesia type: general  Patient location: PACU  Post pain: Pain level controlled  Post assessment: Patient's Cardiovascular Status Stable  Last Vitals:  Filed Vitals:   08/09/12 1745  BP: 107/59  Pulse: 55  Temp:   Resp: 14    Post vital signs: Reviewed and stable  Level of consciousness: sedated  Complications: No apparent anesthesia complications

## 2012-08-09 NOTE — Progress Notes (Signed)
MD notified of pt's right foot coloration. Food is red from the base of the toes up and on the base of pt's foot. He was also notified of pt complaining of pain in right foot and calf, doppler blood flow but does not sound like a pulse as it does in the left foot. MD stated he was aware of this information and to call him back if there is color change in the right foot or leg. Pain medication was administered to pt for comfort. Pt safety is maintained and will continue to monitor.

## 2012-08-09 NOTE — Interval H&P Note (Signed)
History and Physical Interval Note:  08/09/2012 9:58 AM  [name] [name]  has presented today for surgery, with the diagnosis of ISCHEMIC FOOT  The various methods of treatment have been discussed with the patient and family. After consideration of risks, benefits and other options for treatment, the patient has consented to  Procedure(s): AORTA BIFEMORAL BYPASS GRAFT (Bilateral) as a surgical intervention .  The patient's history has been reviewed, patient examined, no change in status, stable for surgery.  I have reviewed the patient's chart and labs.  Questions were answered to the patient's satisfaction.    The patient presented with limb threatening ischemia on 08/07/2012. 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 He has disease in his common femoral artery but a patent proximal superficial femoral and profundus femoris artery. There is also a high-grade stenosis in his left iliac artery. He does have a 5.3 cm abdominal aortic aneurysm and bilateral common iliac artery aneurysms. I discussed this at length with the patient and his family and recommended aortobifemoral bypass grafting for relief of his profound ischemia in his right foot and also treatment of his 5.3 cm aneurysm. He understands procedure risks including a prolonged recovery time and is ready for surgery today Terease Marcotte

## 2012-08-09 NOTE — H&P (View-Only) (Signed)
[facility] SHORT STAY H&P  CC: severe pain in right foot for a week in Pt with known PAD   HPI: [name] [name] is a 79 y.o. male with HX AAA, CAD S/P CABG, HTN and CVA, and renal artery stenosis who is being followed in our office for PAD in right leg and AAA.  Pt states he has had claudication in the right leg for aprox 2 years.  He developed worsening pain in the right leg with rest and night pain over the last week. He was seen in our office  today and ABI's were now 0.4 on the right.  He is admitted for Angiogram by Dr. Arbie Cookey  Past Medical History  Diagnosis Date  . HTN (hypertension)   . Dyslipidemia   . CVA (cerebral vascular accident)     1997, affected right side  . AAA (abdominal aortic aneurysm)     Also aneurysms of the infrarenal aorta and iliac arteries.. followed Dr. Edilia Bo  . Renal artery stenosis     Noted, 2005, medical therapy  . Tobacco abuse   . CAD (coronary artery disease)     Nuclear, August 28, 2011, EF 60%, small fixed defect at the apex consistent with apical thinning and moderate fixed inferobasal defect compatible with prior infarct, no ischemia  . Hx of CABG     2005  . Ejection fraction     Normal,, 2005,( no echo data as of May, 201 2)  . Neuropathy     Right foot  . Myocardial infarction 2005  . Leg pain   . Abdominal pain   . Hyperlipidemia   . Nervousness   . Peripheral vascular disease   . Carotid artery occlusion     Social HX History  Substance Use Topics  . Smoking status: Current Every Day Smoker -- 0.50 packs/day for 50 years    Types: Cigarettes  . Smokeless tobacco: Not on file  . Alcohol Use: Yes     Comment: occasionally    Allergies No Known Allergies  Medications aspirin EC 81 MG tablet  Take 162 mg by mouth daily.  Marland Kitchen  atorvastatin (LIPITOR) 40 MG tablet  Take 40 mg by mouth daily.  .  carvedilol (COREG) 25 MG tablet  Take 25 mg by mouth daily.  .  chlorthalidone (HYGROTON) 25 MG tablet  Take  25 mg by mouth every other day.  .  cholecalciferol (VITAMIN D) 1000 UNITS tablet  Take 1,000 Units by mouth daily.  Marland Kitchen  lisinopril (PRINIVIL,ZESTRIL) 10 MG tablet  Take 10 mg by mouth daily.  .  pioglitazone (ACTOS) 15 MG tablet  Take 15 mg by mouth daily.  Marland Kitchen  tolterodine (DETROL LA) 4 MG 24 hr capsule  Take 4 mg by mouth every other day.  .  traMADol (ULTRAM) 50 MG tablet  Take 50 mg by mouth every 6 (six) hours as needed. For pain  .  vitamin B-12 (CYANOCOBALAMIN) 100 MCG tablet  Take 50 mcg by mouth daily.  Marland Kitchen  zolpidem (AMBIEN) 10 MG tablet  Take 10 mg by mouth at bedtime.   Labs Lab Results  Component Value Date   WBC 9.1 02/03/2011   HGB 13.2 02/03/2011   HCT 41.3 02/03/2011   MCV 94.1 02/03/2011   PLT 274 02/03/2011   I stat 8 -  Results for JALONI, DAVOLI (MRN [id number]) as of 08/07/2012 12:47  Ref. Range 08/07/2012 12:18  Sodium Latest Range: 135-145 mEq/L 140  Potassium Latest  Range: 3.5-5.1 mEq/L 4.2  Chloride Latest Range: 96-112 mEq/L 105  BUN Latest Range: 6-23 mg/dL 38 (H)  Creatinine Latest Range: 0.50-1.35 mg/dL 4.54 (H)  Glucose Latest Range: 70-99 mg/dL 098 (H)  Calcium Ionized Latest Range: 1.13-1.30 mmol/L 1.15  Hemoglobin Latest Range: 13.0-17.0 g/dL 11.9  HCT Latest Range: 39.0-52.0 % 43.0    PHYSICAL EXAM  Filed Vitals:   08/07/12 1132  BP: 147/76  Pulse: 53  Temp: 97.9 F (36.6 C)  Resp: 20    General:  WDWN in NAD HENT: WNL Eyes: Pupils equal Pulmonary: normal non-labored breathing , without Rales, rhonchi,  wheezing Cardiac: RRR, no Murmurs No Carotid Bruits Vascular Exam/Pulses:  2+ radial pulses bilat. No doppler signal in PT or DP on right Monophasic DP on left  Abd soft, no masses noted Extremities with ruberous ischemic changes, no Gangrene , no open wounds;  Neuro A&O x 3; good sensation; motion in all extremities  Impression: Acute on chronic right LE ischemia with worsening ABI's  Plan: Angiogram to assess for  possible intervention RLE Rook Maue J 08/07/2012 12:08 PM

## 2012-08-09 NOTE — Op Note (Signed)
OPERATIVE REPORT  DATE OF SURGERY: 08/09/2012  PATIENT: [name] [name], 79 y.o. male MRN: [id number]  DOB: [birth year]  PRE-OPERATIVE DIAGNOSIS: #1 severe right lower surety arterial insufficiency with severe aortoiliac occlusive disease #2 is infrarenal abdominal aortic aneurysm  POST-OPERATIVE DIAGNOSIS:  Same  PROCEDURE: Aortobifemoral bypass with 18 x 9 Hemashield graft with extensive right femoral endarterectomy, resection of abdominal aortic aneurysm   SURGEON:  Gretta Began, M.D.  PHYSICIAN ASSISTANT: Roczniak  ANESTHESIA:  Gen.  EBL: Per anesthesia ml  Total I/O In: 5220 [I.V.:4100; Blood:620; IV Piggyback:500] Out: 2200 [Urine:800; Blood:1400]  BLOOD ADMINISTERED: Cell Saver  DRAINS: None  SPECIMEN: None  COUNTS CORRECT:  YES  PLAN OF CARE: PACU extubated   PATIENT DISPOSITION:  PACU - hemodynamically stable  PROCEDURE DETAILS: The patient was taken to the operating room placed supine position where the area of the abdomen both groins are prepped in usual sterile fashion. Incisions were made over both groins and carried down to the subcutaneous fat to the level of the femoral arteries. There was no pulse in the right femoral artery and diminished pulse in the left femoral artery. The common superficial femoral and profundus femoris arteries were isolated bilaterally. Distal branches of the peroneal arteries were dissected as well controlled with vessel loops. Tunnels were were created under the inguinal ligament. Next separate incision was made from the level of the xiphoid to below the umbilicus and carried down to midline fascia with electrocautery. The peritoneal cavity was entered. The gallbladder had some fat deposits that when it. The liver was normal stomach was normal small and large bowel were normal. The Omni-Tract retractor was used for exposure. The transverse colon reflected superiorly and the small bowel was reflected to the right. The duodenum was mobilized  off the aorta. The aorta was encircled below the level of the renal arteries. There was a small accessory right renal artery was eventually ligated and divided for repair. Section showed onto the bifurcation. The inferior mesenteric artery was circled with a vessel loop. Dissection should over the iliac vessels. The patient had moderate iliac aneurysms bilaterally extending into the internal and external iliac artery junction and the artery was of normal caliber at this position. Preoperative arteriogram showing complete occlusion of the entire left Cimino entire right iliac system. The left internal iliac artery was chronically occluded. Tunnels were created from the level of the groin to the bifurcation bilaterally taking care to pass behind the level of the ureters bilaterally. The patient was given 25 g mannitol and 9000 intravenous heparin. After circulation time the aorta was occluded below the level renal arteries with a Harken clamp. The left external iliac artery was occluded with a Hanley clamp. The aneurysm was opened with electrocautery longitudinally. A large mural thrombus was removed. The aorta was transected below the level of the proximal clamp. The right iliac artery was occluded at its origin. The incision was continued down to the junction of the external/internal iliac artery on the left. The artery was extensively calcified at this area. This area was oversewn with 3-0 Prolene suture. Clamps removed no backbleeding was noted. The right common iliac artery was oversewn even though it was chronically occluded this also is a 3-0 Prolene suture. The 18 x 9 the initial graft was brought onto the field. It was cut to appropriate dimension and using a felt strip for reinforcement, the the graft was sewn end-to-end to the aorta with a running 3-0 Prolene suture. Anastomosis was tested and found  to be adequate. Next each limb was brought to the respective groin to the prior created,. Attention was first  turned to the right groin. The right common femoral artery was occluded up at the level of the inguinal ligament. The superficial femoral and profundus femoris arteries were occluded with vessel loops. The artery was opened with an 11 blade and symmetry Potts scissors. There was extensive calcification. The common femoral was endarterectomized and this extended onto the proximal superficial femoral artery. The profundus wrist arteries were endarterectomized eversion technique with good backbleeding. The distal plaque was tacked and the superficial femoral artery on the posterior wall. The limb of the graft was cut to appropriate dimension is a long angioplasty. This was sewn in with a running 5-0 Prolene suture. After completion of the anastomosis the usual flushing maneuvers were undertaken. Anastomosis completed and closed with the right groin. There was good Doppler flow in the superficial femoral and profundus femoris arteries. Next attention was turned to the left groin. The left common superficial femoral and function was arteries were occluded. The left groin was also calcified but not as extensive as on the right. The artery was opened 11 blade salicylate Potts scissors. There was calcified plaque this was acceptable for anastomosis. The graft was cut to appropriate length was spatulated and sewn end-to-side to the junction of the superficial femoral and common femoral artery. This again was with a 5-0 Prolene suture as well to. After completion anastomosis the usual flushing maneuvers were undertaken. Anastomosis completed and closed over the left leg. The patient was given 50 mg of protamine to reverse the heparin. The aorta re\re explored. There was one backbleeding vessel and lumbar this was controlled with a 20 figure-of-eight suture. The intermesenteric artery was chronically occluded. The wall of the aortic aneurysm was closed over the graft with a running 2-0 Vicryl suture. The retroperitoneum was  closed with a running 2-0 Vicryl suture. The small bowel was run its entirety and found to be without injury. This placed back in the pelvis and the transverse colon omentum were placed over this. The midline fascia was closed with #1 PDS suture beginning proximally and distally and tying in the middle. Skin was closed with skin staples. The groin wounds were also irrigated with saline and hemostasis obtained with cautery. The wound were closed with 2-0 Vicryl the subcutaneous tissue. Skin was closed with skin staples. Sterile dressings were applied. The patient had good Doppler flow in the left foot is very dampened monophasic signal in the posterior tibial on the right related to his extensive SFA occlusive disease. He was transferred to the recovery room in stable condition   Gretta Began, M.D. 08/09/2012 4:17 PM

## 2012-08-09 NOTE — Progress Notes (Signed)
Dr. Michelle Piper asked for a sign out

## 2012-08-09 NOTE — Transfer of Care (Signed)
Immediate Anesthesia Transfer of Care Note  Patient: [name] [name]  Procedure(s) Performed: Procedure(s): AORTA BIFEMORAL BYPASS GRAFT (N/A)  Patient Location: PACU  Anesthesia Type:General  Level of Consciousness: awake, oriented and patient cooperative  Airway & Oxygen Therapy: Patient Spontanous Breathing and Patient connected to face mask oxygen  Post-op Assessment: Report given to PACU RN, Post -op Vital signs reviewed and stable, Patient moving all extremities and Patient moving all extremities X 4  Post vital signs: Reviewed and stable  Complications: No apparent anesthesia complications

## 2012-08-09 NOTE — Progress Notes (Signed)
Dr. Michelle Piper at bedside to see b/p

## 2012-08-09 NOTE — Anesthesia Preprocedure Evaluation (Signed)
Anesthesia Evaluation  Patient identified by MRN, date of birth, ID band Patient awake    Reviewed: Allergy & Precautions, H&P , NPO status , Patient's Chart, lab work & pertinent test results  Airway Mallampati: I TM Distance: >3 FB Neck ROM: Full    Dental   Pulmonary          Cardiovascular hypertension, Pt. on medications + CAD, + Past MI and + CABG     Neuro/Psych CVA    GI/Hepatic   Endo/Other    Renal/GU CRFRenal disease     Musculoskeletal   Abdominal   Peds  Hematology   Anesthesia Other Findings   Reproductive/Obstetrics                           Anesthesia Physical Anesthesia Plan  ASA: III  Anesthesia Plan: General   Post-op Pain Management:    Induction: Intravenous  Airway Management Planned: Oral ETT  Additional Equipment: Arterial line, CVP, PA Cath and Ultrasound Guidance Line Placement  Intra-op Plan:   Post-operative Plan: Possible Post-op intubation/ventilation  Informed Consent: I have reviewed the patients History and Physical, chart, labs and discussed the procedure including the risks, benefits and alternatives for the proposed anesthesia with the patient or authorized representative who has indicated his/her understanding and acceptance.     Plan Discussed with: CRNA and Surgeon  Anesthesia Plan Comments:         Anesthesia Quick Evaluation

## 2012-08-09 NOTE — Anesthesia Procedure Notes (Signed)
Procedure Name: Intubation Date/Time: 08/09/2012 10:46 AM Performed by: Coralee Rud Pre-anesthesia Checklist: Patient identified, Emergency Drugs available, Suction available, Patient being monitored and Timeout performed Patient Re-evaluated:Patient Re-evaluated prior to inductionOxygen Delivery Method: Circle system utilized Preoxygenation: Pre-oxygenation with 100% oxygen Intubation Type: IV induction Ventilation: Mask ventilation without difficulty Laryngoscope Size: Miller and 5 Grade View: Grade II Tube type: Oral Number of attempts: 1 Airway Equipment and Method: Stylet Placement Confirmation: ETT inserted through vocal cords under direct vision and positive ETCO2 Secured at: 22 cm Tube secured with: Tape

## 2012-08-09 NOTE — Progress Notes (Signed)
Dr. Darrick Penna  came and examined pt's feet on call tonight

## 2012-08-10 ENCOUNTER — Inpatient Hospital Stay (HOSPITAL_COMMUNITY): Payer: Medicare Other

## 2012-08-10 DIAGNOSIS — R05 Cough: Secondary | ICD-10-CM | POA: Diagnosis not present

## 2012-08-10 DIAGNOSIS — I714 Abdominal aortic aneurysm, without rupture: Secondary | ICD-10-CM

## 2012-08-10 LAB — COMPREHENSIVE METABOLIC PANEL WITH GFR
ALT: 15 U/L (ref 0–53)
AST: 26 U/L (ref 0–37)
Albumin: 3.3 g/dL — ABNORMAL LOW (ref 3.5–5.2)
Alkaline Phosphatase: 47 U/L (ref 39–117)
BUN: 35 mg/dL — ABNORMAL HIGH (ref 6–23)
CO2: 21 meq/L (ref 19–32)
Calcium: 8.4 mg/dL (ref 8.4–10.5)
Chloride: 106 meq/L (ref 96–112)
Creatinine, Ser: 1.9 mg/dL — ABNORMAL HIGH (ref 0.50–1.35)
GFR calc Af Amer: 37 mL/min — ABNORMAL LOW (ref >90)
GFR calc non Af Amer: 32 mL/min — ABNORMAL LOW (ref >90)
Glucose, Bld: 136 mg/dL — ABNORMAL HIGH (ref 70–99)
Potassium: 4.3 meq/L (ref 3.5–5.1)
Sodium: 138 meq/L (ref 135–145)
Total Bilirubin: 0.7 mg/dL (ref 0.3–1.2)
Total Protein: 6 g/dL (ref 6.0–8.3)

## 2012-08-10 LAB — CBC
HCT: 37.3 % — ABNORMAL LOW (ref 39.0–52.0)
Hemoglobin: 12.3 g/dL — ABNORMAL LOW (ref 13.0–17.0)
MCHC: 33 g/dL (ref 30.0–36.0)
RBC: 4.02 MIL/uL — ABNORMAL LOW (ref 4.22–5.81)

## 2012-08-10 LAB — AMYLASE: Amylase: 41 U/L (ref 0–105)

## 2012-08-10 LAB — GLUCOSE, CAPILLARY
Glucose-Capillary: 137 mg/dL — ABNORMAL HIGH (ref 70–99)
Glucose-Capillary: 108 mg/dL — ABNORMAL HIGH (ref 70–99)

## 2012-08-10 LAB — MAGNESIUM: Magnesium: 1.7 mg/dL (ref 1.5–2.5)

## 2012-08-10 LAB — HEMOGLOBIN A1C: Hgb A1c MFr Bld: 6.1 % — ABNORMAL HIGH (ref <5.7)

## 2012-08-10 MED ORDER — SODIUM CHLORIDE 0.9 % IV SOLN
Freq: Once | INTRAVENOUS | Status: AC
  Administered 2012-08-10: 08:00:00 via INTRAVENOUS

## 2012-08-10 MED ORDER — MUPIROCIN 2 % EX OINT
1.0000 "application " | TOPICAL_OINTMENT | Freq: Two times a day (BID) | CUTANEOUS | Status: AC
  Administered 2012-08-10 – 2012-08-15 (×10): 1 via NASAL
  Filled 2012-08-10: qty 22

## 2012-08-10 MED ORDER — CHLORHEXIDINE GLUCONATE CLOTH 2 % EX PADS
6.0000 | MEDICATED_PAD | Freq: Every day | CUTANEOUS | Status: AC
  Administered 2012-08-10 – 2012-08-13 (×4): 6 via TOPICAL

## 2012-08-10 NOTE — Progress Notes (Signed)
VASCULAR LAB PRELIMINARY  ARTERIAL  ABI completed:    RIGHT    LEFT    PRESSURE WAVEFORM  PRESSURE WAVEFORM  BRACHIAL 128 triphasic BRACHIAL 131 triphasic  DP   DP    AT 50 Severely dampened monophasic AT 78 Severely dampened monophasic  PT   PT 82 Severely dampened monophasic  PER 41 Severely dampened monophasic PER    GREAT TOE  flat GREAT TOE  flat    RIGHT LEFT  ABI 0.38 0.63     [name] [name], RVT 08/10/2012, 10:25 AM

## 2012-08-10 NOTE — Progress Notes (Signed)
PT Cancellation Note  Patient Details Name: SHLOMA ROGGENKAMP MRN: [id number] DOB: 23-Mar-1931   Cancelled Treatment:    Reason Eval/Treat Not Completed: Fatigue/lethargy limiting ability to participate (Pt OOB with nursing this AM.)  PT to complete eval tomorrow.   Ilda Foil 08/10/2012, 3:19 PM  Aida Raider, PT  Office # [phone] Pager [phone]

## 2012-08-10 NOTE — Progress Notes (Addendum)
[facility] [place]  Subjective  - Abdominal pain, foot pain slightly improved, uneventful night   Objective 103/55 70 99.3 F (37.4 C) (Oral) 16 97%  Intake/Output Summary (Last 24 hours) at 08/10/12 0746 Last data filed at 08/10/12 0700  Gross per 24 hour  Intake 6516.67 ml  Output   3510 ml  Net 3006.67 ml   Abdomen: dressing dry, soft Extremities: no groin hematoma, right foot ruborous but warm, brisk peroneal doppler faint monophasic DP, left foot monophasic DP but stronger than right NG bilious Urine output approx 50 mL/hr Neuro awake alert follows commands Pulmonary good sats minimal O2 requirements  CBC    Component Value Date/Time   WBC 23.9* 08/10/2012 0400   RBC 4.02* 08/10/2012 0400   HGB 12.3* 08/10/2012 0400   HCT 37.3* 08/10/2012 0400   PLT 146* 08/10/2012 0400   MCV 92.8 08/10/2012 0400   MCH 30.6 08/10/2012 0400   MCHC 33.0 08/10/2012 0400   RDW 14.4 08/10/2012 0400   LYMPHSABS 2.1 02/03/2011 1155   MONOABS 0.7 02/03/2011 1155   EOSABS 0.3 02/03/2011 1155   BASOSABS 0.1 02/03/2011 1155    BMET    Component Value Date/Time   NA 138 08/10/2012 0400   K 4.3 08/10/2012 0400   CL 106 08/10/2012 0400   CO2 21 08/10/2012 0400   GLUCOSE 136* 08/10/2012 0400   BUN 35* 08/10/2012 0400   CREATININE 1.90* 08/10/2012 0400   CALCIUM 8.4 08/10/2012 0400   GFRNONAA 32* 08/10/2012 0400   GFRAA 37* 08/10/2012 0400       Assessment/Planning: POD #1 AAA repair with significant occlusive disease, both feet viable, profunda flow hopefully improve over the next few weeks but may need fem pop at some point down the road  Elevated creatinine probably some element of ATN vs prerenal continue IV fluid, bolus once this am  Blood loss anemia follow for now asymptomatic  Leukocytosis most likely reactive trend for now  Ileus leave NG one more day  D/c swan and aline  OOB  FIELDS,CHARLES E 08/10/2012 7:46 AM --  Laboratory Lab Results:  Recent Labs   08/09/12 1900 08/10/12 0400  WBC 25.0* 23.9*  HGB 12.1* 12.3*  HCT 36.0* 37.3*  PLT 157 146*   BMET  Recent Labs  08/09/12 1603 08/10/12 0400  NA 138 138  K 4.1 4.3  CL 106 106  CO2 21 21  GLUCOSE 138* 136*  BUN 31* 35*  CREATININE 1.75* 1.90*  CALCIUM 8.1* 8.4    COAG Lab Results  Component Value Date   INR 1.15 08/09/2012   INR 0.95 08/07/2012   No results found for this basename: PTT

## 2012-08-11 ENCOUNTER — Inpatient Hospital Stay (HOSPITAL_COMMUNITY): Payer: Medicare Other

## 2012-08-11 DIAGNOSIS — Z452 Encounter for adjustment and management of vascular access device: Secondary | ICD-10-CM | POA: Diagnosis not present

## 2012-08-11 DIAGNOSIS — J9819 Other pulmonary collapse: Secondary | ICD-10-CM | POA: Diagnosis not present

## 2012-08-11 LAB — CBC
HCT: 33.1 % — ABNORMAL LOW (ref 39.0–52.0)
Platelets: 127 10*3/uL — ABNORMAL LOW (ref 150–400)
RDW: 14.8 % (ref 11.5–15.5)
WBC: 22.1 10*3/uL — ABNORMAL HIGH (ref 4.0–10.5)

## 2012-08-11 LAB — GLUCOSE, CAPILLARY
Glucose-Capillary: 123 mg/dL — ABNORMAL HIGH (ref 70–99)
Glucose-Capillary: 107 mg/dL — ABNORMAL HIGH (ref 70–99)

## 2012-08-11 LAB — BASIC METABOLIC PANEL
Chloride: 108 mEq/L (ref 96–112)
GFR calc Af Amer: 43 mL/min — ABNORMAL LOW (ref >90)
Potassium: 4.7 mEq/L (ref 3.5–5.1)

## 2012-08-11 MED ORDER — ALBUTEROL SULFATE (5 MG/ML) 0.5% IN NEBU
2.5000 mg | INHALATION_SOLUTION | Freq: Once | RESPIRATORY_TRACT | Status: AC
  Administered 2012-08-11: 2.5 mg via RESPIRATORY_TRACT

## 2012-08-11 MED ORDER — ALBUTEROL SULFATE (5 MG/ML) 0.5% IN NEBU
INHALATION_SOLUTION | RESPIRATORY_TRACT | Status: AC
  Administered 2012-08-11: 2.5 mg
  Filled 2012-08-11: qty 0.5

## 2012-08-11 MED ORDER — ALBUTEROL SULFATE (5 MG/ML) 0.5% IN NEBU
2.5000 mg | INHALATION_SOLUTION | Freq: Once | RESPIRATORY_TRACT | Status: DC
  Filled 2012-08-11: qty 0.5

## 2012-08-11 MED ORDER — ALBUTEROL SULFATE (5 MG/ML) 0.5% IN NEBU
2.5000 mg | INHALATION_SOLUTION | RESPIRATORY_TRACT | Status: DC | PRN
Start: 2012-08-11 — End: 2012-08-19
  Administered 2012-08-12 – 2012-08-15 (×5): 2.5 mg via RESPIRATORY_TRACT
  Filled 2012-08-11 (×6): qty 0.5

## 2012-08-11 NOTE — Progress Notes (Signed)
[facility] [place]  Subjective  - Thirsty, no BM, no flatus   Objective 141/83 92 97.9 F (36.6 C) (Oral) 12 96%  Intake/Output Summary (Last 24 hours) at 08/11/12 0837 Last data filed at 08/11/12 0800  Gross per 24 hour  Intake   2670 ml  Output   1555 ml  Net   1115 ml   Chest wheezing expiratory in bases Abdomen: incision clean dry Groins: incisions clean dry Extremities-doppler left and right foot left still better with some rubor and pain in right foot  ABI right 0.4 left 0.6  Neuro: alert and oriented moves extremities  Assessment/Planning: Wheezing either underlying COPD or possibly CHF Renal dysfunction creatinine near baseline continue to follow Ileus dc NG today keep NpO Right foot still ischemic but reasonable ABI for now.  Dr Arbie Cookey to reevaluate tomorrow.  Woodrow Drab E 08/11/2012 8:37 AM --  Laboratory Lab Results:  Recent Labs  08/10/12 0400 08/11/12 0431  WBC 23.9* 22.1*  HGB 12.3* 10.8*  HCT 37.3* 33.1*  PLT 146* 127*   BMET  Recent Labs  08/10/12 0400 08/11/12 0431  NA 138 138  K 4.3 4.7  CL 106 108  CO2 21 22  GLUCOSE 136* 126*  BUN 35* 38*  CREATININE 1.90* 1.70*  CALCIUM 8.4 8.3*    COAG Lab Results  Component Value Date   INR 1.15 08/09/2012   INR 0.95 08/07/2012   No results found for this basename: PTT

## 2012-08-11 NOTE — Progress Notes (Signed)
Physical Therapy Evaluation Patient Details Name: [name] [name] MRN: [id number] DOB: [birth year] Today's Date: 08/11/2012 Time: 1430-1500 PT Time Calculation (min): 30 min  PT Assessment / Plan / Recommendation Clinical Impression  79 yo male s/p AAArepair, RLE aortobifemoral bypass, extensive endarterectomy; Presents with decr functional mobility; Will benefit from PT to maximize independence and safety with mobility and to facilitate dc planning; Of note, pt is an excellent Rehab candidate, and will do well at CIR; Still, he would really like to be home as soon as possible; If pt opts for home, may need to consider ambulance transport home    PT Assessment  Patient needs continued PT services    Follow Up Recommendations  [facility]    Does the patient have the potential to tolerate intense rehabilitation      Barriers to Discharge Inaccessible home environment;Decreased caregiver support      Equipment Recommendations  Wheelchair (measurements PT);Wheelchair cushion (measurements PT)    Recommendations for Other Services OT consult;Rehab consult   Frequency Min 3X/week    Precautions / Restrictions Precautions Precautions: Fall   Pertinent Vitals/Pain 8/10 pain; RN premedicated pt for pain      Mobility  Bed Mobility Bed Mobility: Supine to Sit;Sitting - Scoot to Edge of Bed Supine to Sit: 3: Mod assist;With rails Sitting - Scoot to Edge of Bed: 3: Mod assist;With rail Details for Bed Mobility Assistance: cues for technique Transfers Transfers: Sit to Stand;Stand to Sit Sit to Stand: 3: Mod assist;From bed Stand to Sit: 3: Mod assist;With upper extremity assist;To chair/3-in-1 Details for Transfer Assistance: Cues for safety, hand placemetn; Noted uncontrolled descent Ambulation/Gait Ambulation/Gait Assistance: 1: +2 Total assist Ambulation/Gait: Patient Percentage: 60% Ambulation Distance (Feet): 5 Feet Assistive device: Rolling walker Ambulation/Gait Assistance  Details: Pain and fatigue limiting amb distance; Cues for gait sequence; Pt sat impulsively Gait Pattern: Step-to pattern    Exercises     PT Diagnosis: Difficulty walking;Abnormality of gait;Generalized weakness;Acute pain  PT Problem List: Decreased strength;Decreased range of motion;Decreased activity tolerance;Decreased balance;Decreased mobility;Decreased knowledge of use of DME;Decreased safety awareness;Pain PT Treatment Interventions: DME instruction;Gait training;Stair training;Functional mobility training;Therapeutic activities;Therapeutic exercise;Balance training;Patient/family education   PT Goals Acute Rehab PT Goals PT Goal Formulation: With patient Time For Goal Achievement: 08/25/12 Potential to Achieve Goals: Good Pt will go Supine/Side to Sit: with supervision PT Goal: Supine/Side to Sit - Progress: Goal set today Pt will go Sit to Supine/Side: with supervision PT Goal: Sit to Supine/Side - Progress: Goal set today Pt will go Sit to Stand: with supervision PT Goal: Sit to Stand - Progress: Goal set today Pt will go Stand to Sit: with supervision PT Goal: Stand to Sit - Progress: Goal set today Pt will Transfer Bed to Chair/Chair to Bed: with supervision PT Transfer Goal: Bed to Chair/Chair to Bed - Progress: Goal set today Pt will Ambulate: 51 - 150 feet;with supervision;with rolling walker PT Goal: Ambulate - Progress: Goal set today Pt will Go Up / Down Stairs: 3-5 stairs;with min assist;with rail(s) PT Goal: Up/Down Stairs - Progress: Goal set today  Visit Information  Last PT Received On: 08/11/12 Assistance Needed: +2 (helpful)    Subjective Data  Subjective: REALLY wants to go home; "I'll do anything you want so I can go home" Patient Stated Goal: Would like to be home to watch the [place] (it's this Thursday)   Prior Functioning  Home Living Lives With: Spouse Available Help at Discharge: Family;Available 24 hours/day (sons can help  intermittently) Type  of Home: House Home Access: Stairs to enter [employer] of Steps: 7 (5 from the street, then 2 into house) Entrance Stairs-Rails: Right;Left Home Layout: One level Home Adaptive Equipment: Walker - rolling Prior Function Level of Independence: Independent with assistive device(s) Communication Communication: No difficulties    Cognition  Cognition Arousal/Alertness: Awake/alert Behavior During Therapy: WFL for tasks assessed/performed Overall Cognitive Status: Within Functional Limits for tasks assessed    Extremity/Trunk Assessment Right Upper Extremity Assessment RUE ROM/Strength/Tone: [facility] for tasks assessed Left Upper Extremity Assessment LUE ROM/Strength/Tone: WFL for tasks assessed Right Lower Extremity Assessment RLE ROM/Strength/Tone: Deficits;Due to pain RLE ROM/Strength/Tone Deficits: Unable to lift LE against gravity in supine osition; Difficulty with WBing ue to pain Left Lower Extremity Assessment LLE ROM/Strength/Tone: Deficits LLE ROM/Strength/Tone Deficits: Generally weak, requiring phsyical assist for successful sit to stand   Balance    End of Session PT - End of Session Equipment Utilized During Treatment: Gait belt Activity Tolerance: Patient limited by fatigue;Patient limited by pain Patient left: in chair;with call bell/phone within reach Nurse Communication: Mobility status  GP     Van Clines [facility] 08/11/2012, 5:42 PM [place], [place] [phone]

## 2012-08-11 NOTE — Progress Notes (Signed)
Pt upset and mildly agitated about NPO status. I have provided teaching concerning necessity of NPO status, provided emotional support, and distraction. Pt still upset and states "don't know about all this" and "might not have had this surgery if I had known about not being able to have anything to drink" however does reiterate back to me that he understands why is NPO and why it is important. Will continue to educate and encourage understanding of importance of NPO status and provide emotional support.

## 2012-08-12 ENCOUNTER — Encounter (HOSPITAL_COMMUNITY): Payer: Self-pay | Admitting: Vascular Surgery

## 2012-08-12 DIAGNOSIS — R5381 Other malaise: Secondary | ICD-10-CM | POA: Diagnosis not present

## 2012-08-12 LAB — TYPE AND SCREEN
ABO/RH(D): A POS
Antibody Screen: NEGATIVE
Unit division: 0

## 2012-08-12 LAB — GLUCOSE, CAPILLARY
Glucose-Capillary: 106 mg/dL — ABNORMAL HIGH (ref 70–99)
Glucose-Capillary: 102 mg/dL — ABNORMAL HIGH (ref 70–99)
Glucose-Capillary: 92 mg/dL (ref 70–99)
Glucose-Capillary: 93 mg/dL (ref 70–99)

## 2012-08-12 LAB — CBC
HCT: 31.5 % — ABNORMAL LOW (ref 39.0–52.0)
MCV: 94.9 fL (ref 78.0–100.0)
RBC: 3.32 MIL/uL — ABNORMAL LOW (ref 4.22–5.81)
WBC: 17.6 10*3/uL — ABNORMAL HIGH (ref 4.0–10.5)

## 2012-08-12 LAB — BASIC METABOLIC PANEL
BUN: 38 mg/dL — ABNORMAL HIGH (ref 6–23)
CO2: 21 mEq/L (ref 19–32)
Chloride: 112 mEq/L (ref 96–112)
Creatinine, Ser: 1.59 mg/dL — ABNORMAL HIGH (ref 0.50–1.35)

## 2012-08-12 MED ORDER — PNEUMOCOCCAL VAC POLYVALENT 25 MCG/0.5ML IJ INJ: 0.5000 mL | INJECTION | INTRAMUSCULAR | Status: DC | PRN

## 2012-08-12 MED ORDER — SODIUM CHLORIDE 0.9 % IV SOLN
INTRAVENOUS | Status: DC
  Administered 2012-08-12 – 2012-08-13 (×2): via INTRAVENOUS

## 2012-08-12 MED ORDER — BISACODYL 10 MG RE SUPP
10.0000 mg | Freq: Once | RECTAL | Status: AC
  Administered 2012-08-12: 10 mg via RECTAL

## 2012-08-12 NOTE — Progress Notes (Signed)
Physical Therapy Treatment Patient Details Name: [name] [name] MRN: [id number] DOB: Dec 21, 1931 Today's Date: 08/12/2012 Time: 6213-0865 PT Time Calculation (min): 28 min  PT Assessment / Plan / Recommendation Comments on Treatment Session  Pt s/p fem pop and AAA repair with decr mobility secondary to decr endurance and decr balance limiting mobility.  Will benefit from PT to address mobility issues.  Poor safety at times as well.      Follow Up Recommendations  CIR                 Equipment Recommendations  Wheelchair (measurements PT);Wheelchair cushion (measurements PT)    Recommendations for Other Services OT consult;Rehab consult  Frequency Min 3X/week   Plan Discharge plan remains appropriate;Frequency remains appropriate    Precautions / Restrictions Precautions Precautions: Fall Restrictions Weight Bearing Restrictions: No   Pertinent Vitals/Pain VSS, right LE  pain    Mobility  Bed Mobility Bed Mobility: Supine to Sit;Sitting - Scoot to Edge of Bed Supine to Sit: 3: Mod assist;With rails Sitting - Scoot to Edge of Bed: 3: Mod assist;With rail Details for Bed Mobility Assistance: cues for technique Transfers Transfers: Sit to Stand;Stand to Sit Sit to Stand: 3: Mod assist;From bed Stand to Sit: 3: Mod assist;With upper extremity assist;To chair/3-in-1 Details for Transfer Assistance: Cues for safety, hand placemetn; Noted uncontrolled descent Ambulation/Gait Ambulation/Gait Assistance: 1: +2 Total assist Ambulation/Gait: Patient Percentage: 70% Ambulation Distance (Feet): 15 Feet Assistive device: Rolling walker Ambulation/Gait Assistance Details: Pt needed cues for gait sequence.  Pt seemed to need incr cues at times secondary to slow response to questions and slow response to cuing.   Gait Pattern: Step-to pattern Gait velocity: decreased General Gait Details: Limited by right foot pain.  Was able to ambulate to sink and wash face with PT/OT.   Stairs:  No Wheelchair Mobility Wheelchair Mobility: No    Exercises General Exercises - Upper Extremity Shoulder Flexion: Theraband;10 reps;Both;Strengthening;Seated Theraband Level (Shoulder Flexion): Level 1 (Yellow) Shoulder ABduction: Strengthening;Both;10 reps;Seated;Theraband Theraband Level (Shoulder Abduction): Level 1 (Yellow) Elbow Flexion: Strengthening;Both;10 reps;Seated;Theraband Theraband Level (Elbow Flexion): Level 1 (Yellow) Elbow Extension: Strengthening;Both;10 reps;Seated;Theraband Theraband Level (Elbow Extension): Level 1 (Yellow)   PT Goals Acute Rehab PT Goals PT Goal Formulation: With patient Pt will go Sit to Stand: with supervision PT Goal: Sit to Stand - Progress: Progressing toward goal Pt will go Stand to Sit: with supervision PT Goal: Stand to Sit - Progress: Progressing toward goal Pt will Transfer Bed to Chair/Chair to Bed: with supervision PT Transfer Goal: Bed to Chair/Chair to Bed - Progress: Progressing toward goal Pt will Ambulate: 51 - 150 feet;with supervision;with rolling walker PT Goal: Ambulate - Progress: Progressing toward goal  Visit Information  Last PT Received On: 08/12/12 Assistance Needed: +2 PT/OT Co-Evaluation/Treatment: Yes    Subjective Data  Subjective: "I guess if the two of you 79 year old women have trouble getting me up, my 77 year old wife will not be able to pick me up."   Cognition  Cognition Arousal/Alertness: Awake/alert Behavior During Therapy: WFL for tasks assessed/performed Overall Cognitive Status: Within Functional Limits for tasks assessed    Balance  Balance Balance Assessed: Yes Dynamic Sitting Balance Dynamic Sitting - Balance Support: No upper extremity supported;Feet supported Dynamic Sitting - Level of Assistance:  (Mod difficulty transitioning forward in sitting. Falling bac) Static Standing Balance Static Standing - Balance Support: Bilateral upper extremity supported;During functional  activity Static Standing - Level of Assistance: 4: Min assist  End  of Session PT - End of Session Equipment Utilized During Treatment: Gait belt Activity Tolerance: Patient limited by fatigue;Patient limited by pain Patient left: in chair;with call bell/phone within reach Nurse Communication: Mobility status        [name] [name] [name] 08/12/2012, 12:53 PM [facility] Acute Rehabilitation [phone] (pager)

## 2012-08-12 NOTE — Progress Notes (Addendum)
Pt upset about L AC IV frequent beeping and asking for Ambien to help him sleep. When explained that we have nothing right now for sleep pt again becomes mildly agitated about NPO status and upset that cannot have Ambien as he does at home. I continue to provide emotional support and teach-back concerning NPO status and pt able to state to me understanding of necessity of NPO status immediately post-op. New IV started to avoid IV beeping, emotional support provided.

## 2012-08-12 NOTE — Clinical Documentation Improvement (Signed)
CKD DOCUMENTATION CLARIFICATION QUERY   THIS DOCUMENT IS NOT A PERMANENT PART OF THE MEDICAL RECORD  TO RESPOND TO THE THIS QUERY, FOLLOW THE INSTRUCTIONS BELOW:  1. If needed, update documentation for the patient's encounter via the notes activity.  2. Access this query again and click edit on the In [employer].  3. After updating, or not, click F2 to complete all highlighted (required) fields concerning your review. Select "additional documentation in the medical record" OR "no additional documentation provided".  4. Click Sign note button.  5. The deficiency will fall out of your In Basket *Please let us know if you are not able to complete this workflow by phone or e-mail (listed below).  Please update your documentation within the medical record to reflect your response to this query.                                                                                        08/12/12   Dear Dr. Early:/Associates,  In a better effort to capture your patient's severity of illness, reflect appropriate length of stay and utilization of resources, a review of the patient medical record has revealed the following indicators.    Based on your clinical judgment, please clarify and document in a progress note and/or discharge summary the clinical condition associated with the following supporting information:  In responding to this query please exercise your independent judgment.  The fact that a query is asked, does not imply that any particular answer is desired or expected.   Possible Clinical Conditions?   _______CKD Stage III - GFR 30-59  _______CKD Stage IV - GFR 15-29  _______CKD Stage V - GFR < 15  _______Other condition  _______Cannot Clinically determine      Risk Factors:  CKD noted per 6/05 progress notes.  Diagnostics:  Lab:  Bun: 6/04:   38 6/06:   31 6/07:   35  Creat: 6/04:   1.60 6/06:   1.75 6/07:   1.90  GFR:  6/04: non Af. Am:  43;  Af. Am:  50 6/06: non Af. Am: 36;   Af. Am: 41 6/07: non Af. Am: 32;   Af. Am: 37  Treatment:  You may use possible, probable, or suspect with inpatient documentation. possible, probable, suspected diagnoses MUST be documented at the time of discharge  Reviewed:No additional documentation provided.   Thank You,  Marciano Sequin,  Clinical Documentation Specialist:  Phone: [phone]  Health Information Management [place]

## 2012-08-12 NOTE — Progress Notes (Signed)
I will follow up in the morning. Patient could benefit from an inpt rehab admission depending on bed availability. [phone]

## 2012-08-12 NOTE — Consult Note (Signed)
Physical Medicine and Rehabilitation Consult  Reason for Consult: PAD with claudication RLE, AAA Referring Physician:  Dr. Arbie Cookey   HPI: [name] [name] is a 79 y.o. male with history of HTN, CVA, AAA, tobacco abuse, PAD with severe right foot pain with limb threatening ischemia. He was admitted on 08/09/12 for AA with extensive right femoral endarterectomy and resection of AAA by Dr. Arbie Cookey. Post op with abdominal pain due to ileus. NGT discontinued and remains NPO. Therapies initiated and patient has decreased endurance as well as difficulty weight bearing on R-foot due to pain. MD, therapy team recommending CIR.  Patient has no nausea and constipation. Low energy level Review of Systems  Eyes: Negative for blurred vision and double vision.  Respiratory: Positive for shortness of breath and wheezing. Negative for cough.   Cardiovascular: Negative for chest pain and palpitations.  Gastrointestinal: Positive for constipation.  Genitourinary: Negative for urgency and frequency.  Musculoskeletal: Positive for myalgias. Negative for back pain and joint pain.  Neurological: Positive for weakness.  Psychiatric/Behavioral: The patient has insomnia.    Past Medical History  Diagnosis Date  . HTN (hypertension)   . Dyslipidemia   . CVA (cerebral vascular accident)     1997, affected right side  . AAA (abdominal aortic aneurysm)     Also aneurysms of the infrarenal aorta and iliac arteries.. followed Dr. Edilia Bo  . Renal artery stenosis     Noted, 2005, medical therapy  . Tobacco abuse   . CAD (coronary artery disease)     Nuclear, August 28, 2011, EF 60%, small fixed defect at the apex consistent with apical thinning and moderate fixed inferobasal defect compatible with prior infarct, no ischemia  . Hx of CABG     2005  . Ejection fraction     Normal,, 2005,( no echo data as of May, 201 2)  . Neuropathy     Right foot  . Myocardial infarction 2005  . Leg pain   . Abdominal pain   .  Hyperlipidemia   . Nervousness(799.21)   . Peripheral vascular disease   . Carotid artery occlusion    Past Surgical History  Procedure Laterality Date  . Coronary artery bypass graft  2005    x4  . Spine surgery  1977    Lumbar HNP surgery   History reviewed. No pertinent family history.   Social History:  Married. Retired Medical illustrator. He reports that he has been smoking Cigarettes.  He has a 25 pack-year smoking history. He does not have any smokeless tobacco history on file. He reports that  drinks alcohol. He reports that he does not use illicit drugs.   Allergies: No Known Allergies  Medications Prior to Admission  Medication Sig Dispense Refill  . aspirin EC 81 MG tablet Take 162 mg by mouth daily.       Marland Kitchen atorvastatin (LIPITOR) 40 MG tablet Take 40 mg by mouth daily.       . carvedilol (COREG) 25 MG tablet Take 25 mg by mouth daily.       . chlorthalidone (HYGROTON) 25 MG tablet Take 25 mg by mouth every other day.       . cholecalciferol (VITAMIN D) 1000 UNITS tablet Take 1,000 Units by mouth daily.       Marland Kitchen HYDROcodone-acetaminophen (NORCO/VICODIN) 5-325 MG per tablet Take 1-2 tablets by mouth every 6 (six) hours as needed for pain. TAKE 1-2 TABS EVERY 6 HRS PRN/ PAIN      . lisinopril (PRINIVIL,ZESTRIL) 10  MG tablet Take 10 mg by mouth daily.      . pioglitazone (ACTOS) 15 MG tablet Take 15 mg by mouth daily.       Marland Kitchen tolterodine (DETROL LA) 4 MG 24 hr capsule Take 4 mg by mouth every other day.       . traMADol (ULTRAM) 50 MG tablet Take 50 mg by mouth every 6 (six) hours as needed for pain. For pain      . vitamin B-12 (CYANOCOBALAMIN) 100 MCG tablet Take 50 mcg by mouth daily.       Marland Kitchen zolpidem (AMBIEN) 10 MG tablet Take 10 mg by mouth at bedtime.       . [DISCONTINUED] HYDROcodone-acetaminophen (NORCO/VICODIN) 5-325 MG per tablet TAKE 1-2 TABS EVERY 6 HRS PRN/ PAIN  20 tablet  0  . [DISCONTINUED] lisinopril (PRINIVIL,ZESTRIL) 10 MG tablet Take 1 tablet (10 mg total) by  mouth daily.  30 tablet  1    Home: Home Living Lives With: Spouse Available Help at Discharge: Family;Available 24 hours/day (sons can help intermittently) Type of Home: House Home Access: Stairs to enter [employer] of Steps: 7 (5 from the street, then 2 into house) Entrance Stairs-Rails: Right;Left Home Layout: One level Bathroom Shower/Tub: Engineer, manufacturing systems: Standard Bathroom Accessibility: Yes How Accessible: Accessible via walker Home Adaptive Equipment: Walker - rolling  Functional History:   Functional Status:  Mobility: Bed Mobility Bed Mobility: Supine to Sit;Sitting - Scoot to Edge of Bed Supine to Sit: 3: Mod assist;With rails Sitting - Scoot to Edge of Bed: 3: Mod assist;With rail Transfers Transfers: Sit to Stand;Stand to Sit Sit to Stand: 3: Mod assist;From bed Stand to Sit: 3: Mod assist;With upper extremity assist;To chair/3-in-1 Ambulation/Gait Ambulation/Gait Assistance: 1: +2 Total assist Ambulation/Gait: Patient Percentage: 70% Ambulation Distance (Feet): 15 Feet Assistive device: Rolling walker Ambulation/Gait Assistance Details: Pt needed cues for gait sequence.  Pt seemed to need incr cues at times secondary to slow response to questions and slow response to cuing.   Gait Pattern: Step-to pattern Gait velocity: decreased General Gait Details: Limited by right foot pain.  Was able to ambulate to sink and wash face with PT/OT.   Stairs: No Wheelchair Mobility Wheelchair Mobility: No  ADL: ADL Eating/Feeding:  (ice chips only) Grooming: Set up;Supervision/safety Where Assessed - Grooming: Supported standing Upper Body Bathing: Minimal assistance Where Assessed - Upper Body Bathing: Supported sitting Lower Body Bathing: Maximal assistance Where Assessed - Lower Body Bathing: Supported sit to stand Upper Body Dressing: Moderate assistance Where Assessed - Upper Body Dressing: Supported sitting Lower Body Dressing: +1  Total assistance Where Assessed - Lower Body Dressing: Supported sit to Pharmacist, hospital: +2 Total assistance Toilet Transfer Method: Sit to Barista: Other (comment) (stood and used urinal) Equipment Used: Gait belt;Rolling walker Transfers/Ambulation Related to ADLs: +2 toatl A. Pt @ 50% ADL Comments: limited bypain, weakness and poor endurance. May benefit from AE  Cognition: Cognition Overall Cognitive Status: Within Functional Limits for tasks assessed Arousal/Alertness: Awake/alert Orientation Level: Oriented X4 Cognition Arousal/Alertness: Awake/alert Behavior During Therapy: WFL for tasks assessed/performed Overall Cognitive Status: Within Functional Limits for tasks assessed  Blood pressure 175/96, pulse 106, temperature 98.7 F (37.1 C), temperature source Oral, resp. rate 20, height 5\' 10"  (1.778 m), weight 97 kg (213 lb 13.5 oz), SpO2 97.00%.  Physical Exam  Nursing note and vitals reviewed. Constitutional: He is oriented to person, place, and time. He appears well-developed and well-nourished.  HENT:  Head:  Normocephalic and atraumatic.  Eyes: Pupils are equal, round, and reactive to light.  Neck: Normal range of motion. Neck supple.  Cardiovascular: Normal rate and regular rhythm.   Pulmonary/Chest: Effort normal. No respiratory distress. He has wheezes (audible). He has no rales.  Abdominal: He exhibits distension. Bowel sounds are absent.  Musculoskeletal: He exhibits tenderness.  Ischemic appearing toes -- painful to touch.   Neurological: He is alert and oriented to person, place, and time.  Skin: Skin is warm and dry.  Midline incision clean and dry with staples in place.    motor strength is 4/5 in bilateral deltoid, biceps, triceps, grip, hip flexor, knee extensor, right ankle dorsiflexor and plantar flexor 3 minus left ankle dorsiflexor and plantar flexor are 4 Hyperalgesia on right foot, toes are erythematous  Results for  orders placed during the hospital encounter of 08/09/12 (from the past 24 hour(s))  GLUCOSE, CAPILLARY     Status: Abnormal   Collection Time    08/11/12  6:58 PM      Result Value Range   Glucose-Capillary 108 (*) 70 - 99 mg/dL  GLUCOSE, CAPILLARY     Status: Abnormal   Collection Time    08/11/12  8:20 PM      Result Value Range   Glucose-Capillary 107 (*) 70 - 99 mg/dL  CBC     Status: Abnormal   Collection Time    08/12/12  5:30 AM      Result Value Range   WBC 17.6 (*) 4.0 - 10.5 K/uL   RBC 3.32 (*) 4.22 - 5.81 MIL/uL   Hemoglobin 10.1 (*) 13.0 - 17.0 g/dL   HCT 16.1 (*) 09.6 - 04.5 %   MCV 94.9  78.0 - 100.0 fL   MCH 30.4  26.0 - 34.0 pg   MCHC 32.1  30.0 - 36.0 g/dL   RDW 40.9  81.1 - 91.4 %   Platelets 163  150 - 400 K/uL  BASIC METABOLIC PANEL     Status: Abnormal   Collection Time    08/12/12  5:30 AM      Result Value Range   Sodium 142  135 - 145 mEq/L   Potassium 4.5  3.5 - 5.1 mEq/L   Chloride 112  96 - 112 mEq/L   CO2 21  19 - 32 mEq/L   Glucose, Bld 119 (*) 70 - 99 mg/dL   BUN 38 (*) 6 - 23 mg/dL   Creatinine, Ser 7.82 (*) 0.50 - 1.35 mg/dL   Calcium 8.9  8.4 - 95.6 mg/dL   GFR calc non Af Amer 40 (*) >90 mL/min   GFR calc Af Amer 46 (*) >90 mL/min  GLUCOSE, CAPILLARY     Status: Abnormal   Collection Time    08/12/12  8:05 AM      Result Value Range   Glucose-Capillary 106 (*) 70 - 99 mg/dL   Comment 1 Notify RN     Comment 2 Documented in Chart    GLUCOSE, CAPILLARY     Status: Abnormal   Collection Time    08/12/12 12:25 PM      Result Value Range   Glucose-Capillary 102 (*) 70 - 99 mg/dL   Dg Chest Port 1 View  08/11/2012   *RADIOLOGY REPORT*  Clinical Data: Wheezing, rule out CHF  PORTABLE CHEST - 1 VIEW  Comparison: Prior chest x-ray 08/10/2012  Findings: Interval removal of Swan-Ganz catheter and nasogastric tube.  The right IJ vascular sheath remains in place  with the tip in the upper SVC.  Stable cardiomegaly in this patient status post  median sternotomy with evidence of multivessel CABG.  Negative for pulmonary edema.  Improving aeration in the lung bases with only trace residual bibasilar atelectasis.  Aortic atherosclerosis again noted.  IMPRESSION:  1.  Negative for pulmonary edema 2.  Improved aeration of the bases with minimal residual subsegmental atelectasis. 3.  Interval removal of Swan-Ganz catheter and nasogastric tube.   Original Report Authenticated By: Malachy Moan, M.D.    Assessment/Plan: Diagnosis: Deconditioning after abdominal aortic aneurysm repair and right femoral endarterectomy 1. Does the need for close, 24 hr/day medical supervision in concert with the patient's rehab needs make it unreasonable for this patient to be served in a less intensive setting? Yes 2. Co-Morbidities requiring supervision/potential complications: Probable ileus, hypertension, history of CVA, ischemic neuropathy right foot 3. Due to bladder management, bowel management, safety, skin/wound care, disease management, medication administration, pain management and patient education, does the patient require 24 hr/day rehab nursing? Yes 4. Does the patient require coordinated care of a physician, rehab nurse, PT (1-2 hrs/day, 5 days/week) and OT (1-2 hrs/day, 5 days/week) to address physical and functional deficits in the context of the above medical diagnosis(es)? Yes Addressing deficits in the following areas: balance, endurance, locomotion, strength, transferring, bowel/bladder control, bathing, dressing, feeding, grooming and toileting 5. Can the patient actively participate in an intensive therapy program of at least 3 hrs of therapy per day at least 5 days per week? Not yet but should be ready in 2 or 3 days 6. The potential for patient to make measurable gains while on inpatient rehab is good 7. Anticipated functional outcomes upon discharge from inpatient rehab are supervision to min assist mobility with PT, supervision min assist  ADLs with OT, not applicable with SLP. 8. Estimated rehab length of stay to reach the above functional goals is: 2 weeks 9. Does the patient have adequate social supports to accommodate these discharge functional goals? Yes 10. Anticipated D/C setting: Home 11. Anticipated post D/C treatments: HH therapy 12. Overall Rehab/Functional Prognosis: good  RECOMMENDATIONS: This patient's condition is appropriate for continued rehabilitative care in the following setting: CIR Patient has agreed to participate in recommended program. Potentially Note that insurance prior authorization may be required for reimbursement for recommended care.  Comment:    08/12/2012

## 2012-08-12 NOTE — Progress Notes (Signed)
Occupational Therapy Evaluation Patient Details Name: [name] [name] MRN: [id number] DOB: 19-Oct-1931 Today's Date: 08/12/2012 Time: 7829-5621 OT Time Calculation (min): 25 min  OT Assessment / Plan / Recommendation Clinical Impression  Pt s/p AAA repair. Fem pop bypass RLE. Pt continues with RLE pain. PTA, pt independent with ADL and mobility. Pt will benefit from skilled OT services to facilitate D/C to CIR due to below deficits to return to PLOF and D/C home with supportive wife.    OT Assessment  Patient needs continued OT Services    Follow Up Recommendations  CIR    Barriers to Discharge None elderly wife  Equipment Recommendations  3 in 1 bedside comode;Tub/shower bench    Recommendations for Other Services Rehab consult  Frequency  Min 2X/week    Precautions / Restrictions Precautions Precautions: Fall Restrictions Weight Bearing Restrictions: No   Pertinent Vitals/Pain desats to high 80s with increase activity RA HR high 120s with activity    ADL  Eating/Feeding:  (ice chips only) Grooming: Set up;Supervision/safety Where Assessed - Grooming: Supported standing Upper Body Bathing: Minimal assistance Where Assessed - Upper Body Bathing: Supported sitting Lower Body Bathing: Maximal assistance Where Assessed - Lower Body Bathing: Supported sit to stand Upper Body Dressing: Moderate assistance Where Assessed - Upper Body Dressing: Supported sitting Lower Body Dressing: +1 Total assistance Where Assessed - Lower Body Dressing: Supported sit to Pharmacist, hospital: +2 Total assistance Toilet Transfer: Patient Percentage: 50% Statistician Method: Sit to Barista: Other (comment) (stood and used urinal) Toileting - Architect and Hygiene: Maximal assistance Where Assessed - Engineer, mining and Hygiene: Sit to stand from 3-in-1 or toilet Equipment Used: Gait belt;Rolling walker Transfers/Ambulation Related to  ADLs: +2 toatl A. Pt @ 50% ADL Comments: limited bypain, weakness and poor endurance. May benefit from AE    OT Diagnosis: Generalized weakness;Acute pain  OT Problem List: Decreased strength;Decreased range of motion;Decreased activity tolerance;Impaired balance (sitting and/or standing);Decreased safety awareness;Decreased knowledge of use of DME or AE;Decreased knowledge of precautions;Cardiopulmonary status limiting activity;Pain;Impaired UE functional use OT Treatment Interventions: Self-care/ADL training;Therapeutic exercise;Energy conservation;DME and/or AE instruction;Therapeutic activities;Patient/family education;Balance training   OT Goals Acute Rehab OT Goals OT Goal Formulation: With patient Time For Goal Achievement: 08/26/12 Potential to Achieve Goals: Good ADL Goals Pt Will Perform Upper Body Bathing: Unsupported;Sitting, chair;with supervision;with set-up ADL Goal: Upper Body Bathing - Progress: Goal set today Pt Will Perform Lower Body Bathing: with min assist;Supported;with adaptive equipment;Sit to stand from chair ADL Goal: Lower Body Bathing - Progress: Goal set today Pt Will Perform Upper Body Dressing: with set-up;with supervision;Unsupported;Sit to stand from chair ADL Goal: Upper Body Dressing - Progress: Goal set today Pt Will Perform Lower Body Dressing: with min assist;Unsupported;with adaptive equipment;Sit to stand from chair ADL Goal: Lower Body Dressing - Progress: Goal set today Pt Will Transfer to Toilet: with supervision;Ambulation;with DME;3-in-1 ADL Goal: Toilet Transfer - Progress: Goal set today Pt Will Perform Toileting - Clothing Manipulation: with supervision;Sitting on 3-in-1 or toilet;Standing ADL Goal: Toileting - Clothing Manipulation - Progress: Goal set today Pt Will Perform Toileting - Hygiene: with supervision;Sit to stand from 3-in-1/toilet ADL Goal: Toileting - Hygiene - Progress: Goal set today Additional ADL Goal #1: complete bed  mobility with S for ADL ADL Goal: Additional Goal #1 - Progress: Goal set today  Visit Information  Last OT Received On: 08/12/12 Assistance Needed: +2 (helpful)    Subjective Data      Prior Functioning  Home Living Lives With: Spouse Available Help at Discharge: Family;Available 24 hours/day (sons can help intermittently) Type of Home: House Home Access: Stairs to enter [employer] of Steps: 7 (5 from the street, then 2 into house) Entrance Stairs-Rails: Right;Left Home Layout: One level Bathroom Shower/Tub: Engineer, manufacturing systems: Standard Bathroom Accessibility: Yes How Accessible: Accessible via walker Home Adaptive Equipment: Walker - rolling Prior Function Level of Independence: Independent with assistive device(s) Communication Communication: No difficulties         Vision/Perception Vision - History Baseline Vision: Wears glasses only for reading   Cognition  Cognition Arousal/Alertness: Awake/alert Behavior During Therapy: WFL for tasks assessed/performed Overall Cognitive Status: Within Functional Limits for tasks assessed    Extremity/Trunk Assessment Right Upper Extremity Assessment RUE ROM/Strength/Tone: Deficits RUE ROM/Strength/Tone Deficits: R shoulder weakness from previous CVA Left Upper Extremity Assessment LUE ROM/Strength/Tone: Deficits LUE ROM/Strength/Tone Deficits: general weakness LUE Coordination: WFL - gross/fine motor Right Lower Extremity Assessment RLE ROM/Strength/Tone: Deficits;Due to pain RLE ROM/Strength/Tone Deficits: Unable to lift LE against gravity in supine osition; Difficulty with WBing ue to pain Left Lower Extremity Assessment LLE ROM/Strength/Tone: Deficits LLE ROM/Strength/Tone Deficits: Generally weak, requiring phsyical assist for successful sit to stand     Mobility Bed Mobility Bed Mobility: Supine to Sit;Sitting - Scoot to Edge of Bed Supine to Sit: 3: Mod assist;With rails Sitting  - Scoot to Edge of Bed: 3: Mod assist;With rail Details for Bed Mobility Assistance: cues for technique Transfers Sit to Stand: 3: Mod assist;From bed Stand to Sit: 3: Mod assist;With upper extremity assist;To chair/3-in-1 Details for Transfer Assistance: Cues for safety, hand placemetn; Noted uncontrolled descent     Exercise General Exercises - Upper Extremity Shoulder Flexion: Theraband;10 reps;Both;Strengthening;Seated Theraband Level (Shoulder Flexion): Level 1 (Yellow) Shoulder ABduction: Strengthening;Both;10 reps;Seated;Theraband Theraband Level (Shoulder Abduction): Level 1 (Yellow) Elbow Flexion: Strengthening;Both;10 reps;Seated;Theraband Theraband Level (Elbow Flexion): Level 1 (Yellow) Elbow Extension: Strengthening;Both;10 reps;Seated;Theraband Theraband Level (Elbow Extension): Level 1 (Yellow)   Balance Balance Balance Assessed: Yes Dynamic Sitting Balance Dynamic Sitting - Balance Support: No upper extremity supported;Feet supported Dynamic Sitting - Level of Assistance: 3: Mod assist (Mod difficulty transitioning forward in sitting. Falling bac) Static Standing Balance Static Standing - Balance Support: Bilateral upper extremity supported;During functional activity Static Standing - Level of Assistance: 4: Min assist   End of Session OT - End of Session Equipment Utilized During Treatment: Gait belt Activity Tolerance: Patient limited by fatigue;Patient limited by pain Patient left: in chair;with call bell/phone within reach Nurse Communication: Mobility status  GO     Jonavin Seder,HILLARY 08/12/2012, 10:21 AM Luisa Dago, OTR/L  [phone] 08/12/2012

## 2012-08-12 NOTE — Progress Notes (Addendum)
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 male who is  up s/p Procedure(s): AORTA BIFEMORAL BYPASS GRAFT Pt is doing well. complains of incisional pain; denies nausea/vomiting; denies diarrhea. has not had flatus;has not had BM Thirsty - would like Ice chips Has voided with foley out C/O pain  And sensitivity persist in right foot  IMAGING: Dg Chest Port 1 View  08/11/2012   *RADIOLOGY REPORT*  Clinical Data: Wheezing, rule out CHF  PORTABLE CHEST - 1 VIEW  Comparison: Prior chest x-ray 08/10/2012  Findings: Interval removal of Swan-Ganz catheter and nasogastric tube.  The right IJ vascular sheath remains in place with the tip in the upper SVC.  Stable cardiomegaly in this patient status post median sternotomy with evidence of multivessel CABG.  Negative for pulmonary edema.  Improving aeration in the lung bases with only trace residual bibasilar atelectasis.  Aortic atherosclerosis again noted.  IMPRESSION:  1.  Negative for pulmonary edema 2.  Improved aeration of the bases with minimal residual subsegmental atelectasis. 3.  Interval removal of Swan-Ganz catheter and nasogastric tube.   Original Report Authenticated By: Malachy Moan, M.D.    Significant Diagnostic Studies: CBC Lab Results  Component Value Date   WBC 17.6* 08/12/2012   HGB 10.1* 08/12/2012   HCT 31.5* 08/12/2012   MCV 94.9 08/12/2012   PLT 163 08/12/2012    BMET    Component Value Date/Time   NA 142 08/12/2012 0530   K 4.5 08/12/2012 0530   CL 112 08/12/2012 0530   CO2 21 08/12/2012 0530   GLUCOSE 119* 08/12/2012 0530   BUN 38* 08/12/2012 0530   CREATININE 1.59* 08/12/2012 0530   CALCIUM 8.9 08/12/2012 0530   GFRNONAA 40*  08/12/2012 0530   GFRAA 46* 08/12/2012 0530    COAG Lab Results  Component Value Date   INR 1.15 08/09/2012   INR 0.95 08/07/2012   No results found for this basename: PTT    I/O last 3 completed shifts: In: 3340 [I.V.:3200; NG/GT:140] Out: 1900 [Urine:1500; Emesis/NG output:400]    Physical Examination BP Readings from Last 3 Encounters:  08/12/12 147/77  08/12/12 147/77  08/07/12 151/74   Temp Readings from Last 3 Encounters:  08/12/12 98.2 F (36.8 C) Oral  08/12/12 98.2 F (36.8 C) Oral  08/07/12 98 F (36.7 C) Oral   SpO2 Readings from Last 3 Encounters:  08/12/12 94%  08/12/12 94%  08/07/12 97%   Pulse Readings from Last 3 Encounters:  08/12/12 80  08/12/12 80  08/07/12 77    General: A&O x 3, WDWN male in NAD Pulmonary: normal non-labored breathing Cardiac: Heart rate : regular, ST ,  Abdomen:abdomen soft, non-tender and minimal BS Abdominal wound:clean, dry, intact  Neurologic: A&O X 3; Appropriate Affect ;  Speech is fluent/normal RLE - normal sensation and motion Toes tender on right Vascular Exam:  BLE  Equal warmth Right foot continues to be ruberous  LE PULSES                                RIGHT  LEFT      POSTERIOR TIBIAL absent  monophasic by Doppler       DORSALIS PEDIS      ANTERIOR TIBIAL  monophasic by Doppler   monophasic by Doppler    Non-Invasive Vascular Imaging ABI'S: Right 0.38; Left 0.63 Unchanged on right - improved on left Assessment/Plan: [name] [name] is a 79 y.o. male who is 3 Days Post-Op Procedure(s): AORTA BIFEMORAL BYPASS GRAFT for AAA repair and profound ischemia of right leg sec to occluded Iliac artery on the right and bilat fem-pop occlusive disease  Post-op ileus- resolving - + BS no Flatus or BM - Suppository today and ice chips  CRI - K+ 4.5 - with SCR stable/sl improved today - will change IVF to NS  Ischemic right foot in Pt with bilat fem-pop occlusive disease - flow to Right foot with  normal sensation and motion - may need further intervention on right to improve flow  Right foot pain- may have reperfusion pain vs continued decreased flow to the foot  Left ABI improved with non tender foot at baseline Right ABI unchanged from pre-op   [name] [name],[name] [name]  08/12/2012 7:42 AM   History and exam as above Incisions healing Right foot warm but obviously not perfect perfusion.  Will give a few weeks to recover from this operation before considering fem pop  Leukocytosis trending down Renal function stable Decrease IV rate to 75 mL/hr  Minimize ice chips no more than 1/2 cup per day until more bowel function  Transfer to 2000  [name] Bruns, MD [facility] [place] Office: [phone] Pager: [phone]

## 2012-08-12 NOTE — Plan of Care (Signed)
Problem: Phase II Progression Outcomes Goal: Discharge plan established Outcome: Progressing Needs inpatient rehab and possibly longer rehab afterwards. Care management consulted. Have discussed with daughter and patients wife

## 2012-08-12 NOTE — Progress Notes (Signed)
Rehab Admissions Coordinator Note:  Patient was screened by Meryl Dare for appropriateness for an Inpatient Acute Rehab Consult.  At this time, we are recommending Inpatient Rehab consult.  Meryl Dare 08/12/2012, 9:22 AM  I can be reached at [phone].

## 2012-08-12 NOTE — Care Management Note (Signed)
    Page 1 of 2   08/19/2012     4:11:59 PM   CARE MANAGEMENT NOTE 08/19/2012  Patient:  [name] [name], [name] [name]   Account Number:  [id number]  Date Initiated:  08/12/2012  Documentation initiated by:  [facility]  Subjective/Objective Assessment:   Post op AAA repair.  Lives with wife.     Action/Plan:   Anticipated DC Date:  08/16/2012   Anticipated DC Plan:  IP REHAB FACILITY      DC Planning Services  CM consult      Choice offered to / List presented to:             Status of service:  Completed, signed off Medicare Important Message given?   (If response is "NO", the following Medicare IM given date fields will be blank) Date Medicare IM given:   Date Additional Medicare IM given:    Discharge Disposition:  IP REHAB FACILITY  Per UR Regulation:  Reviewed for med. necessity/level of care/duration of stay  If discussed at Long Length of Stay Meetings, dates discussed:   08/15/2012    Comments:  Contact:  Yeung,Nancy B Spouse [phone]   [phone]  08/19/12 Shivan Hodes,RN,BSN [phone] PT DISCHARGED TO IP REHAB TODAY.  08/16/12 Emilynn Srinivasan,RN,BSN [phone] PT NOW WITH CARDIAC CONSULT IN PROGRESS; NEW HEART MEDS STARTED TODAY.  REHAB MD TO REEVALUATE TOMORROW FOR POTENTIAL WEEKEND CIR ADMISSION, PER ANN, ADMISSIONS LIASION.  08/15/12 Amiylah Anastos,RN,BSN [phone] PULMONARY C/S PENDING.  BOWEL ISSUES RESOLVED.  HOPEFULLY TO  CIR AFTER  PULM C/S COMPLETED.  08/14/12 Shelsea Hangartner,RN,BSN [phone] NO BM SINCE SURGERY.  PT TO DC TO CIR ONCE BOWEL ISSUES RESOLVED.  WILL FOLLOW.  08-12-12 11:55am Avie Arenas, RNBSN [phone] tx to acute unit.  Daughter in room.  Have two brothers that are around also and will be available to assist but would like patient to go to inhouse rehab for rehab prior to going home.  Discussed back up as rehab in SNF but daughter states patient probably will not consent for that and she would be able to transition and assist at home post CIR.   PT/OT ordered - SW consult placed for backup.

## 2012-08-12 NOTE — Clinical Documentation Improvement (Signed)
Anemia Blood Loss Clarification  THIS DOCUMENT IS NOT A PERMANENT PART OF THE MEDICAL RECORD  RESPOND TO THE THIS QUERY, FOLLOW THE INSTRUCTIONS BELOW:  1. If needed, update documentation for the patient's encounter via the notes activity.  2. Access this query again and click edit on the In [employer].  3. After updating, or not, click F2 to complete all highlighted (required) fields concerning your review. Select "additional documentation in the medical record" OR "no additional documentation provided".  4. Click Sign note button.  5. The deficiency will fall out of your In Basket *Please let us know if you are not able to complete this workflow by phone or e-mail (listed below).        08/12/12  Dear Dr. Arbie Cookey Marton Redwood  In an effort to better capture your patient's severity of illness, reflect appropriate length of stay and utilization of resources, a review of the patient medical record has revealed the following indicators.    Based on your clinical judgment, please clarify and document in a progress note and/or discharge summary the clinical condition associated with the following supporting information:  In responding to this query please exercise your independent judgment.  The fact that a query is asked, does not imply that any particular answer is desired or expected.   Possible Clinical Conditions?   " Expected Acute Blood Loss Anemia  " Acute Blood Loss Anemia  " Acute on chronic blood loss anemia  " Precipitous drop in Hematocrit  " Other Condition  " Cannot Clinically Determine    Risk Factors:  EBL: 1400 ml per 6/06 Anesthesia record.  Signs and Symptoms: Blood loss anemia noted per 6/07 progress notes.  Diagnostics: H&H on 6/04:  14.2/43.9 H&H on 6/09:   10.1/31.5   IV fluids / plasma expanders: Per 6/06 Anesthesia record: Cell saver:    ; Albumin 5%: ; LR:               ; LR:                 Reviewed: additional  documentation in the medical record  Thank You,  Burna Mortimer Mathews-Bethea,RN,BSN.  Clinical Documentation Specialist:  Phone: [phone]  Health Information Management [place]

## 2012-08-12 NOTE — Progress Notes (Signed)
Transferred to 2011 via wheelchair. Tolerated well. Placed in bed after arrival. On AAA pathway. Report given to Ander Purpura, RN. Patient is a very heavy 2 person assist.

## 2012-08-13 LAB — CBC
HCT: 29.1 % — ABNORMAL LOW (ref 39.0–52.0)
MCHC: 32.3 g/dL (ref 30.0–36.0)
RDW: 14.9 % (ref 11.5–15.5)

## 2012-08-13 LAB — BASIC METABOLIC PANEL
CO2: 21 mEq/L (ref 19–32)
Calcium: 8.8 mg/dL (ref 8.4–10.5)
Chloride: 112 mEq/L (ref 96–112)
Glucose, Bld: 95 mg/dL (ref 70–99)

## 2012-08-13 LAB — GLUCOSE, CAPILLARY
Glucose-Capillary: 87 mg/dL (ref 70–99)
Glucose-Capillary: 84 mg/dL (ref 70–99)
Glucose-Capillary: 104 mg/dL — ABNORMAL HIGH (ref 70–99)
Glucose-Capillary: 126 mg/dL — ABNORMAL HIGH (ref 70–99)

## 2012-08-13 MED ORDER — ZOLPIDEM TARTRATE 5 MG PO TABS
5.0000 mg | ORAL_TABLET | Freq: Every day | ORAL | Status: DC
  Administered 2012-08-13 – 2012-08-18 (×6): 5 mg via ORAL
  Filled 2012-08-13 (×6): qty 1

## 2012-08-13 MED ORDER — ATORVASTATIN CALCIUM 40 MG PO TABS
40.0000 mg | ORAL_TABLET | Freq: Every day | ORAL | Status: DC
  Administered 2012-08-13 – 2012-08-18 (×6): 40 mg via ORAL
  Filled 2012-08-13 (×7): qty 1

## 2012-08-13 MED ORDER — FESOTERODINE FUMARATE ER 4 MG PO TB24
4.0000 mg | ORAL_TABLET | Freq: Every day | ORAL | Status: DC
  Administered 2012-08-13 – 2012-08-19 (×7): 4 mg via ORAL
  Filled 2012-08-13 (×7): qty 1

## 2012-08-13 MED ORDER — TRAMADOL HCL 50 MG PO TABS
50.0000 mg | ORAL_TABLET | Freq: Four times a day (QID) | ORAL | Status: DC | PRN
  Administered 2012-08-15 – 2012-08-18 (×3): 50 mg via ORAL
  Filled 2012-08-13 (×3): qty 1

## 2012-08-13 MED ORDER — ASPIRIN EC 81 MG PO TBEC
162.0000 mg | DELAYED_RELEASE_TABLET | Freq: Every day | ORAL | Status: DC
  Administered 2012-08-13 – 2012-08-19 (×7): 162 mg via ORAL
  Filled 2012-08-13 (×7): qty 2

## 2012-08-13 MED ORDER — HYDROCODONE-ACETAMINOPHEN 5-325 MG PO TABS
1.0000 | ORAL_TABLET | Freq: Four times a day (QID) | ORAL | Status: DC | PRN
  Administered 2012-08-13 – 2012-08-16 (×6): 2 via ORAL
  Administered 2012-08-16: 1 via ORAL
  Administered 2012-08-17 – 2012-08-19 (×6): 2 via ORAL
  Administered 2012-08-19: 1 via ORAL
  Filled 2012-08-13 (×14): qty 2

## 2012-08-13 MED ORDER — ZOLPIDEM TARTRATE 5 MG PO TABS: 10.0000 mg | ORAL_TABLET | Freq: Every day | ORAL | Status: DC

## 2012-08-13 MED ORDER — CARVEDILOL 25 MG PO TABS
25.0000 mg | ORAL_TABLET | Freq: Every day | ORAL | Status: DC
  Administered 2012-08-13 – 2012-08-15 (×3): 25 mg via ORAL
  Filled 2012-08-13 (×5): qty 1

## 2012-08-13 MED ORDER — LISINOPRIL 10 MG PO TABS
10.0000 mg | ORAL_TABLET | Freq: Every day | ORAL | Status: DC
  Administered 2012-08-13 – 2012-08-17 (×5): 10 mg via ORAL
  Filled 2012-08-13 (×6): qty 1

## 2012-08-13 MED FILL — Heparin Sodium (Porcine) Inj 1000 Unit/ML: INTRAMUSCULAR | Qty: 30 | Status: AC

## 2012-08-13 MED FILL — Sodium Chloride IV Soln 0.9%: INTRAVENOUS | Qty: 2000 | Status: AC

## 2012-08-13 NOTE — Progress Notes (Signed)
Pt ambulated to doorway and assisted to [facility] with RN and NT using front wheel walker. Pt tolerated well, states foot causing walking to be more painful. Pt attempted BM but unsuccessful, back in bed to rest, call light within reach. Will continue to monitor.

## 2012-08-13 NOTE — Progress Notes (Addendum)
[facility] Progress Note  08/13/2012 8:04 AM POD 4  Subjective:  C/o not getting sleep last night-states RN would only give him morphine; pt states that he had a friend that had tongue cancer and had it removed and had feeding tube inserted and passed away.  He is afraid he is going to need a feeding tube.  Afebrile x 24 hrs HR 80-100's 130's-180's systolic (180's last pm-down to 130's this am) 94% RA  Filed Vitals:   08/13/12 0655  BP: 138/71  Pulse:   Temp:   Resp:     Physical Exam: Cardiac:  RRR Lungs:  CTAB Abdomen:  Soft NT + BS; some flatus yesterday Incisions:  C/d/i with staples in tact. Extremities:  Right foot remains sore to touch; + monophasic DP; could not obtain right PT, but peroneal is monophasic on the right; Left DP/PT monophasic  CBC    Component Value Date/Time   WBC 10.8* 08/13/2012 0425   RBC 3.04* 08/13/2012 0425   HGB 9.4* 08/13/2012 0425   HCT 29.1* 08/13/2012 0425   PLT 180 08/13/2012 0425   MCV 95.7 08/13/2012 0425   MCH 30.9 08/13/2012 0425   MCHC 32.3 08/13/2012 0425   RDW 14.9 08/13/2012 0425   LYMPHSABS 2.1 02/03/2011 1155   MONOABS 0.7 02/03/2011 1155   EOSABS 0.3 02/03/2011 1155   BASOSABS 0.1 02/03/2011 1155    BMET    Component Value Date/Time   NA 141 08/13/2012 0425   K 4.0 08/13/2012 0425   CL 112 08/13/2012 0425   CO2 21 08/13/2012 0425   GLUCOSE 95 08/13/2012 0425   BUN 38* 08/13/2012 0425   CREATININE 1.40* 08/13/2012 0425   CALCIUM 8.8 08/13/2012 0425   GFRNONAA 47* 08/13/2012 0425   GFRAA 54* 08/13/2012 0425    INR    Component Value Date/Time   INR 1.15 08/09/2012 1603     Intake/Output Summary (Last 24 hours) at 08/13/12 0804 Last data filed at 08/13/12 0605  Gross per 24 hour  Intake   1500 ml  Output   1325 ml  Net    175 ml     Assessment/Plan:  79 y.o. male is s/p  Aortobifemoral bypass with 18 x 9 Hemashield graft with extensive right femoral endarterectomy, resection of abdominal aortic  aneurysm  POD 4  -pt is taking in only 1/2 cup of ice chips-he does have increasing bowel sounds and a little flatus yesterday-possibly advance diet to clear liquid-will d/w Dr. Darrick Penna -pt needs to mobilize-OOB to chair tid and work with PT -possible candidate for CIR-they are checking back today -leukocytosis much improved this am at 10.8 down from 17.6 -Cr improved from yesterday-pt has good UOP -continue and encourage IS 10/hr -stressed to pt importance of being out of bed and mobilizing   Doreatha Massed, PA-C [facility] [phone] 08/13/2012 8:04 AM  Tolerating liquids +flatus, no BM Incisions healing Still has some pain in right foot but overall improve OOB Hopefully advance diet tomorrow Rehab soon  Fabienne Bruns, MD [facility] [place] Office: [phone] Pager: [phone]

## 2012-08-13 NOTE — Progress Notes (Addendum)
I met with patient at bedside. Patient appropriate to admit to inpt rehab once bowel issues resolved if bed available on that day. Patient is aware. [phone] Can we order laxatives to assist? I discussed with RN this morning.[phone]

## 2012-08-13 NOTE — Progress Notes (Signed)
Physical Therapy Treatment Patient Details Name: [name] [name] MRN: [id number] DOB: 1931/06/16 Today's Date: 08/13/2012 Time: 4098-1191 PT Time Calculation (min): 24 min  PT Assessment / Plan / Recommendation Comments on Treatment Session  Pt s/p fem pop and AAA repair with decr mobility secondary to decr endurance and decr balance as well as pain limiting mobility.  Will benefit from PT to address mobility issues.  Poor safety at times as well.  Progressing.  Ready for CIR.      Follow Up Recommendations  CIR                 Equipment Recommendations  Wheelchair (measurements PT);Wheelchair cushion (measurements PT)    Recommendations for Other Services OT consult;Rehab consult  Frequency Min 3X/week   Plan Discharge plan remains appropriate;Frequency remains appropriate    Precautions / Restrictions Precautions Precautions: Fall Restrictions Weight Bearing Restrictions: No   Pertinent Vitals/Pain VSS, right foot pain    Mobility  Bed Mobility Bed Mobility: Supine to Sit;Sitting - Scoot to Edge of Bed Supine to Sit: 3: Mod assist;With rails Sitting - Scoot to Edge of Bed: 3: Mod assist;With rail Details for Bed Mobility Assistance: cues for technique Transfers Transfers: Sit to Stand;Stand to Sit Sit to Stand: 3: Mod assist;From bed Stand to Sit: 3: Mod assist;With upper extremity assist;To chair/3-in-1 Details for Transfer Assistance: Cues for safety, hand placement Ambulation/Gait Ambulation/Gait Assistance: 1: +2 Total assist Ambulation/Gait: Patient Percentage: 80% Ambulation Distance (Feet): 50 Feet Assistive device: Rolling walker Ambulation/Gait Assistance Details: Progressing with distance today.  Needs max encouragement to keep going.  Cues for staying close to RW and sequencing steps.   Gait Pattern: Step-through pattern;Trunk flexed;Wide base of support Gait velocity: decreased General Gait Details: Limited by right foot pain. Once almost back to room,  pt needed urinal and PT gotit and gave it to pt.  He urinated in urinal and then ambulated to sink to wash hands and face.   Stairs: No Wheelchair Mobility Wheelchair Mobility: No    Exercises General Exercises - Lower Extremity Ankle Circles/Pumps: AROM;10 reps;Supine;Both Heel Slides: AROM;Both;10 reps;Supine    PT Goals Acute Rehab PT Goals Pt will go Supine/Side to Sit: with supervision PT Goal: Supine/Side to Sit - Progress: Progressing toward goal Pt will go Sit to Stand: with supervision PT Goal: Sit to Stand - Progress: Progressing toward goal Pt will go Stand to Sit: with supervision PT Goal: Stand to Sit - Progress: Progressing toward goal Pt will Transfer Bed to Chair/Chair to Bed: with supervision PT Transfer Goal: Bed to Chair/Chair to Bed - Progress: Progressing toward goal Pt will Ambulate: 51 - 150 feet;with supervision;with rolling walker PT Goal: Ambulate - Progress: Progressing toward goal  Visit Information  Last PT Received On: 08/13/12 Assistance Needed: +2    Subjective Data  Subjective: "I dislike this bed."   Cognition  Cognition Arousal/Alertness: Awake/alert Behavior During Therapy: WFL for tasks assessed/performed Overall Cognitive Status: Within Functional Limits for tasks assessed    Balance  Dynamic Sitting Balance Dynamic Sitting - Balance Support: No upper extremity supported;Feet supported Dynamic Sitting - Level of Assistance: 5: Stand by assistance (Pt leaning posteriorly.) Static Standing Balance Static Standing - Balance Support: Bilateral upper extremity supported;During functional activity Static Standing - Level of Assistance: 4: Min assist  End of Session PT - End of Session Equipment Utilized During Treatment: Gait belt Activity Tolerance: Patient limited by fatigue;Patient limited by pain Patient left: in chair;with call bell/phone within reach Nurse Communication: Mobility  status        [name],[name] [name] 08/13/2012, 12:04  PM [facility] Acute Rehabilitation [phone] (pager)

## 2012-08-14 LAB — CBC
MCV: 93 fL (ref 78.0–100.0)
Platelets: 214 10*3/uL (ref 150–400)
RBC: 2.86 MIL/uL — ABNORMAL LOW (ref 4.22–5.81)
RDW: 14.6 % (ref 11.5–15.5)
WBC: 8.9 10*3/uL (ref 4.0–10.5)

## 2012-08-14 LAB — GLUCOSE, CAPILLARY
Glucose-Capillary: 121 mg/dL — ABNORMAL HIGH (ref 70–99)
Glucose-Capillary: 136 mg/dL — ABNORMAL HIGH (ref 70–99)
Glucose-Capillary: 109 mg/dL — ABNORMAL HIGH (ref 70–99)

## 2012-08-14 LAB — BASIC METABOLIC PANEL
CO2: 24 mEq/L (ref 19–32)
Chloride: 111 mEq/L (ref 96–112)
GFR calc Af Amer: 53 mL/min — ABNORMAL LOW (ref >90)
Potassium: 3.9 mEq/L (ref 3.5–5.1)

## 2012-08-14 MED ORDER — PANTOPRAZOLE SODIUM 40 MG PO TBEC
40.0000 mg | DELAYED_RELEASE_TABLET | Freq: Every day | ORAL | Status: DC
  Administered 2012-08-14 – 2012-08-19 (×3): 40 mg via ORAL
  Filled 2012-08-14 (×3): qty 1

## 2012-08-14 MED ORDER — SENNOSIDES-DOCUSATE SODIUM 8.6-50 MG PO TABS
2.0000 | ORAL_TABLET | Freq: Every day | ORAL | Status: DC
  Administered 2012-08-14 – 2012-08-18 (×3): 2 via ORAL
  Filled 2012-08-14 (×6): qty 2

## 2012-08-14 MED ORDER — MAGNESIUM CITRATE PO SOLN
0.5000 | Freq: Once | ORAL | Status: AC
  Administered 2012-08-14: 0.5 via ORAL
  Filled 2012-08-14: qty 296

## 2012-08-14 MED ORDER — FUROSEMIDE 10 MG/ML IJ SOLN
20.0000 mg | Freq: Once | INTRAMUSCULAR | Status: AC
  Administered 2012-08-14: 20 mg via INTRAVENOUS

## 2012-08-14 MED ORDER — FUROSEMIDE 10 MG/ML IJ SOLN
INTRAMUSCULAR | Status: AC
  Filled 2012-08-14: qty 4

## 2012-08-14 MED ORDER — ALPRAZOLAM 0.25 MG PO TABS: 0.2500 mg | ORAL_TABLET | Freq: Three times a day (TID) | ORAL | Status: DC | PRN

## 2012-08-14 NOTE — Progress Notes (Signed)
Physical Therapy Treatment Patient Details Name: [name] [name] MRN: [id number] DOB: 07-14-1931 Today's Date: 08/14/2012 Time: 4098-1191 PT Time Calculation (min): 19 min  PT Assessment / Plan / Recommendation Comments on Treatment Session  Pt demonstrates some improvements in functional mobility. At this time feel patient will benefit from intense skilled PT to maximize functional mobility and return to independence. Will continue to follow and progress activity as tolerated.    Follow Up Recommendations  CIR              Recommendations for Other Services OT consult;Rehab consult  Frequency Min 3X/week   Plan Discharge plan remains appropriate;Frequency remains appropriate    Precautions / Restrictions Precautions Precautions: Fall   Pertinent Vitals/Pain NAD    Mobility  Bed Mobility Bed Mobility: Supine to Sit;Sitting - Scoot to Edge of Bed Supine to Sit: 3: Mod assist;HOB elevated Sitting - Scoot to Edge of Bed: 4: Min assist Transfers Transfers: Sit to Stand;Stand to Sit Sit to Stand: 4: Min assist;From elevated surface;With upper extremity assist;From bed Stand to Sit: 4: Min assist;With upper extremity assist;To chair/3-in-1 Details for Transfer Assistance: vc for hand placement and safe technique Ambulation/Gait Ambulation/Gait Assistance: 4: Min assist Ambulation Distance (Feet): 46 Feet Assistive device: Rolling walker Ambulation/Gait Assistance Details: Pt with some instability requiring assist; VCs for proper sequencing using rw Gait Pattern: Step-through pattern;Trunk flexed;Wide base of support Gait velocity: decreased General Gait Details: limited by fatigue Stairs: No    Exercises Other Exercises Other Exercises: encouraged theraband exercises     PT Goals Acute Rehab PT Goals Pt will go Supine/Side to Sit: with supervision Pt will go Sit to Stand: with supervision PT Goal: Sit to Stand - Progress: Progressing toward goal Pt will go Stand to  Sit: with supervision PT Goal: Stand to Sit - Progress: Progressing toward goal Pt will Transfer Bed to Chair/Chair to Bed: with supervision PT Transfer Goal: Bed to Chair/Chair to Bed - Progress: Progressing toward goal Pt will Ambulate: 51 - 150 feet;with supervision;with rolling walker PT Goal: Ambulate - Progress: Progressing toward goal  Visit Information  Last PT Received On: 08/14/12 Assistance Needed: +1    Subjective Data  Subjective: I really don't feel like i am doing a lot better Patient Stated Goal: to go home   Cognition  Cognition Arousal/Alertness: Awake/alert Behavior During Therapy: WFL for tasks assessed/performed Overall Cognitive Status: Within Functional Limits for tasks assessed    Balance  Dynamic Sitting Balance Dynamic Sitting - Balance Support: No upper extremity supported;Feet supported Dynamic Sitting - Level of Assistance: 6: Modified independent (Device/Increase time) Static Standing Balance Static Standing - Balance Support: No upper extremity supported;During functional activity Static Standing - Level of Assistance: 4: Min assist High Level Balance High Level Balance Comments: grooming activities at sink  End of Session PT - End of Session Equipment Utilized During Treatment: Gait belt Activity Tolerance: Patient limited by fatigue;Patient limited by pain Patient left: in chair;with call bell/phone within reach Nurse Communication: Mobility status   GP     Fabio Asa 08/14/2012, 4:47 PM Charlotte Crumb, PT DPT  [phone]

## 2012-08-14 NOTE — Clinical Social Work Note (Signed)
Clinical Social Worker received referral from [facility] regarding SNF placement as backup for CIR. CSW reviewed chart and noticed patient is planned for CIR admission when stable. CSW to sign off, as social work intervention is no longer needed.   Rozetta Nunnery MSW, [employer] [phone]

## 2012-08-14 NOTE — Progress Notes (Signed)
Occupational Therapy Treatment Patient Details Name: [name] [name] MRN: [id number] DOB: June 27, 1931 Today's Date: 08/14/2012 Time: 4098-1191 OT Time Calculation (min): 19 min  OT Assessment / Plan / Recommendation Comments on Treatment Session Pt making good progress. Improving endurance and mobilty. Limited with LB ADL. Pt continues to be an excellent CIR candidate. Feel pt could reach mod I level to return home with wife. will continue to follow acutely. Encouraged theraband ex for UB.    Follow Up Recommendations  CIR    Barriers to Discharge       Equipment Recommendations  3 in 1 bedside comode;Tub/shower bench    Recommendations for Other Services Rehab consult  Frequency Min 2X/week   Plan Discharge plan remains appropriate    Precautions / Restrictions Precautions Precautions: Fall   Pertinent Vitals/Pain O2 SATS above 93 with activity.dyspnea 2/4    ADL  Grooming: Supervision/safety Where Assessed - Grooming: Supported standing Upper Body Dressing: Supervision/safety;Set up Where Assessed - Upper Body Dressing: Unsupported sitting Toilet Transfer: Simulated;Minimal assistance Equipment Used: Gait belt;Rolling walker Transfers/Ambulation Related to ADLs: min A ADL Comments: will address AE    OT Diagnosis:    OT Problem List:   OT Treatment Interventions:     OT Goals Acute Rehab OT Goals OT Goal Formulation: With patient Time For Goal Achievement: 08/26/12 Potential to Achieve Goals: Good ADL Goals Pt Will Perform Upper Body Bathing: Unsupported;Sitting, chair;with supervision;with set-up ADL Goal: Upper Body Bathing - Progress: Met Pt Will Perform Lower Body Bathing: with min assist;Supported;with adaptive equipment;Sit to stand from chair ADL Goal: Lower Body Bathing - Progress: Progressing toward goals Pt Will Perform Upper Body Dressing: with set-up;with supervision;Unsupported;Sit to stand from chair ADL Goal: Upper Body Dressing - Progress: Met Pt  Will Perform Lower Body Dressing: with min assist;Unsupported;with adaptive equipment;Sit to stand from chair ADL Goal: Lower Body Dressing - Progress: Progressing toward goals Pt Will Transfer to Toilet: with supervision;Ambulation;with DME;3-in-1 ADL Goal: Toilet Transfer - Progress: Progressing toward goals Pt Will Perform Toileting - Clothing Manipulation: with supervision;Sitting on 3-in-1 or toilet;Standing ADL Goal: Toileting - Clothing Manipulation - Progress: Progressing toward goals Pt Will Perform Toileting - Hygiene: with supervision;Sit to stand from 3-in-1/toilet ADL Goal: Toileting - Hygiene - Progress: Progressing toward goals Additional ADL Goal #1: complete bed mobility with S for ADL ADL Goal: Additional Goal #1 - Progress: Progressing toward goals  Visit Information  Last OT Received On: 08/14/12 Assistance Needed: +1 PT/OT Co-Evaluation/Treatment: Yes    Subjective Data      Prior Functioning  Prior Function Able to Take Stairs?: Yes Driving: Yes Vocation: Other (comment) Comments: retires Dealer  Cognition Arousal/Alertness: Awake/alert Behavior During Therapy: WFL for tasks assessed/performed Overall Cognitive Status: Within Functional Limits for tasks assessed    Mobility  Bed Mobility Bed Mobility: Supine to Sit;Sitting - Scoot to Edge of Bed Supine to Sit: 3: Mod assist;HOB elevated Sitting - Scoot to Edge of Bed: 4: Min assist Transfers Transfers: Sit to Stand;Stand to Sit Sit to Stand: 4: Min assist;From elevated surface;With upper extremity assist;From bed Stand to Sit: 4: Min assist;With upper extremity assist;To chair/3-in-1 Details for Transfer Assistance: vc for hand placement and safe technique    Exercises  Other Exercises Other Exercises: encouraged theraband exercises   Balance Dynamic Sitting Balance Dynamic Sitting - Level of Assistance: 6: Modified independent (Device/Increase time) Static Standing Balance Static  Standing - Balance Support: No upper extremity supported;During functional activity Static Standing - Level of  Assistance: 4: Min assist High Level Balance High Level Balance Comments: grooming activities at sink   End of Session OT - End of Session Equipment Utilized During Treatment: Gait belt Activity Tolerance: Patient tolerated treatment well Patient left: in chair;with call bell/phone within reach Nurse Communication: Mobility status  GO     Lillian Tigges,HILLARY 08/14/2012, 3:56 PM [facility], OTR/L  [phone] 08/14/2012

## 2012-08-14 NOTE — Progress Notes (Addendum)
[facility] [place]  Post-op  Intra-abdominal Surgery note  Date of Surgery: 08/09/2012  Surgeon(s): Larina Earthly, MD  5 Days Post-Op Procedure(s): AORTA BIFEMORAL BYPASS GRAFT  History of Present Illness  [name] [name] is a 79 y.o. male who is  up s/p Procedure(s): AORTA BIFEMORAL BYPASS GRAFT Pt is doing well. States he had a better night. Some wheezing today. Incisional pain controlled with pain meds; denies nausea/vomiting; denies diarrhea. has had flatus; has had small BM  IMAGING: No results found.  Significant Diagnostic Studies: CBC Lab Results  Component Value Date   WBC 8.9 08/14/2012   HGB 8.7* 08/14/2012   HCT 26.6* 08/14/2012   MCV 93.0 08/14/2012   PLT 214 08/14/2012    BMET    Component Value Date/Time   NA 142 08/14/2012 0351   K 3.9 08/14/2012 0351   CL 111 08/14/2012 0351   CO2 24 08/14/2012 0351   GLUCOSE 128* 08/14/2012 0351   BUN 33* 08/14/2012 0351   CREATININE 1.42* 08/14/2012 0351   CALCIUM 8.3* 08/14/2012 0351   GFRNONAA 46* 08/14/2012 0351   GFRAA 53* 08/14/2012 0351    COAG Lab Results  Component Value Date   INR 1.15 08/09/2012   INR 0.95 08/07/2012   No results found for this basename: PTT    I/O last 3 completed shifts: In: 120 [P.O.:120] Out: 1175 [Urine:1175]    Physical Examination BP Readings from Last 3 Encounters:  08/14/12 152/71  08/14/12 152/71  08/07/12 151/74   Temp Readings from Last 3 Encounters:  08/14/12 98.1 F (36.7 C) Oral  08/14/12 98.1 F (36.7 C) Oral  08/07/12 98 F (36.7 C) Oral   SpO2 Readings from Last 3 Encounters:  08/14/12 98%  08/14/12 98%  08/07/12 97%   Pulse Readings from Last 3 Encounters:  08/14/12 73  08/14/12 73  08/07/12 77    General: A&O x 3, WDWN male in NAD Pulmonary: normal non-labored breathing , with Bilat. Rales, wheezing Cardiac: Heart rate : regular ,  Abdomen:abdomen soft, normal active bowel sounds and mildly distended Abdominal wound:clean, dry,  intact Groin wounds healing well  Neurologic: A&O X 3; Appropriate Affect ;  Speech is fluent/normal Vascular Exam:BLE warm and well perfused Extremities without ischemic changes, no Gangrene, no cellulitis; no open wounds;   Assessment/Plan: [name] [name] is a 79 y.o. male who is 5 Days Post-Op Procedure(s): AORTA BIFEMORAL BYPASS GRAFT Rales Wheezing - fluid overload - lasix IV x 1 CRI Scr 1.42 - stable Acute blood loss anemia may be dilutional Anxiety - will give low dose xanax prn Repeat labs in am Constipation- will give 1/2 bottle mag citrate Will advance diet if takes liquids well Decrease IVF To CIR when bowel issues resolved  ROCZNIAK,REGINA J  08/14/2012 7:57 AM  Incisions healing Still some right foot pain when dependent Toes erythematous on right but forefoot continues to improve Hemoglobin drifting down Will recheck tomorrow Stop Lovenox SCDs for DVT prophylaxis Some right leg edema will duplex to rule out DVT  Fabienne Bruns, MD [facility] [place] Office: [phone] Pager: [phone]

## 2012-08-15 ENCOUNTER — Inpatient Hospital Stay (HOSPITAL_COMMUNITY): Payer: Medicare Other

## 2012-08-15 ENCOUNTER — Encounter (HOSPITAL_COMMUNITY): Payer: Self-pay | Admitting: Pulmonary Disease

## 2012-08-15 DIAGNOSIS — F17200 Nicotine dependence, unspecified, uncomplicated: Secondary | ICD-10-CM

## 2012-08-15 DIAGNOSIS — R0609 Other forms of dyspnea: Secondary | ICD-10-CM

## 2012-08-15 DIAGNOSIS — J449 Chronic obstructive pulmonary disease, unspecified: Secondary | ICD-10-CM

## 2012-08-15 DIAGNOSIS — R0989 Other specified symptoms and signs involving the circulatory and respiratory systems: Secondary | ICD-10-CM | POA: Diagnosis not present

## 2012-08-15 DIAGNOSIS — R0600 Dyspnea, unspecified: Secondary | ICD-10-CM | POA: Diagnosis present

## 2012-08-15 DIAGNOSIS — R062 Wheezing: Secondary | ICD-10-CM | POA: Diagnosis not present

## 2012-08-15 DIAGNOSIS — M7989 Other specified soft tissue disorders: Secondary | ICD-10-CM

## 2012-08-15 DIAGNOSIS — J441 Chronic obstructive pulmonary disease with (acute) exacerbation: Secondary | ICD-10-CM | POA: Diagnosis present

## 2012-08-15 HISTORY — DX: Chronic obstructive pulmonary disease, unspecified: J44.9

## 2012-08-15 LAB — BASIC METABOLIC PANEL
BUN: 34 mg/dL — ABNORMAL HIGH (ref 6–23)
Calcium: 8.5 mg/dL (ref 8.4–10.5)
Chloride: 108 mEq/L (ref 96–112)
Creatinine, Ser: 1.51 mg/dL — ABNORMAL HIGH (ref 0.50–1.35)
GFR calc Af Amer: 49 mL/min — ABNORMAL LOW (ref >90)

## 2012-08-15 LAB — CBC
HCT: 28.7 % — ABNORMAL LOW (ref 39.0–52.0)
MCH: 29.9 pg (ref 26.0–34.0)
MCHC: 32.1 g/dL (ref 30.0–36.0)
MCV: 93.2 fL (ref 78.0–100.0)
Platelets: 232 10*3/uL (ref 150–400)
RDW: 14.7 % (ref 11.5–15.5)
WBC: 10 10*3/uL (ref 4.0–10.5)

## 2012-08-15 LAB — GLUCOSE, CAPILLARY
Glucose-Capillary: 150 mg/dL — ABNORMAL HIGH (ref 70–99)
Glucose-Capillary: 156 mg/dL — ABNORMAL HIGH (ref 70–99)
Glucose-Capillary: 155 mg/dL — ABNORMAL HIGH (ref 70–99)

## 2012-08-15 MED ORDER — MOMETASONE FURO-FORMOTEROL FUM 100-5 MCG/ACT IN AERO
2.0000 | INHALATION_SPRAY | Freq: Two times a day (BID) | RESPIRATORY_TRACT | Status: DC
  Administered 2012-08-15 – 2012-08-19 (×8): 2 via RESPIRATORY_TRACT
  Filled 2012-08-15: qty 8.8

## 2012-08-15 NOTE — Progress Notes (Addendum)
[facility] [place]  Post-op  Intra-abdominal Surgery note  Date of Surgery: 08/09/2012  Surgeon(s): Larina Earthly, MD  6 Days Post-Op Procedure(s): AORTA BIFEMORAL BYPASS GRAFT  History of Present Illness  [name] [name] is a 79 y.o. male who is  up s/p Procedure(s): AORTA BIFEMORAL BYPASS GRAFT Pt is doing well. denies incisional pain; denies nausea/vomiting; denies diarrhea. has had flatus;has had BM  IMAGING: No results found.  Significant Diagnostic Studies: CBC Lab Results  Component Value Date   WBC 10.0 08/15/2012   HGB 9.2* 08/15/2012   HCT 28.7* 08/15/2012   MCV 93.2 08/15/2012   PLT 232 08/15/2012    BMET    Component Value Date/Time   NA 140 08/15/2012 0455   K 3.5 08/15/2012 0455   CL 108 08/15/2012 0455   CO2 24 08/15/2012 0455   GLUCOSE 165* 08/15/2012 0455   BUN 34* 08/15/2012 0455   CREATININE 1.51* 08/15/2012 0455   CALCIUM 8.5 08/15/2012 0455   GFRNONAA 42* 08/15/2012 0455   GFRAA 49* 08/15/2012 0455    COAG Lab Results  Component Value Date   INR 1.15 08/09/2012   INR 0.95 08/07/2012   No results found for this basename: PTT    I/O last 3 completed shifts: In: 720 [P.O.:720] Out: 1950 [Urine:1950]    Physical Examination BP Readings from Last 3 Encounters:  08/15/12 144/67  08/15/12 144/67  08/07/12 151/74   Temp Readings from Last 3 Encounters:  08/15/12 97.8 F (36.6 C) Oral  08/15/12 97.8 F (36.6 C) Oral  08/07/12 98 F (36.7 C) Oral   SpO2 Readings from Last 3 Encounters:  08/15/12 98%  08/15/12 98%  08/07/12 97%   Pulse Readings from Last 3 Encounters:  08/15/12 78  08/15/12 78  08/07/12 77    General: A&O x 3, WDWN male in NAD Pulmonary:  non-labored breathing , without Rales, rhonchi, positive wheezing Cardiac: Heart rate : regular ,  Abdomen:non-tender and normal active bowel sounds Abdominal wound:clean, dry, intact  Neurologic: A&O X 3; Appropriate Affect ;  Speech is fluent/normal  Vascular  Exam:BLE warm with distal right 1/3 of foot and toes ruborous.  Very tender to donning and doffing socks.  DP monophasic doppler signal.  Left monophasic with stronger doppler signal. Extremities without ischemic changes, no Gangrene, no cellulitis; no open wounds;     Non-Invasive Vascular Imaging ABI'S: Right 0.38; Left 0.63  Assessment/Plan: [name] [name] is a 79 y.o. male who is 6 Days Post-Op Procedure(s): AORTA BIFEMORAL BYPASS GRAFT Wheezing with long history of smoking.   Ordered 2 view chest x-ray and will call pulmonary consult per Dr. Darrick Penna recommendations. Plan CIR pending chest x-ray and Pulmonary exam.    Thomasena Edis, EMMA [facility]  08/15/2012 7:52 AM   + BM, incisions healing Right foot unchanged, toe erythema DVT ultrasound negative Some wheezing this am chest xray unremarkable  Will have pulmonary eval to see if COPD is optimized.  I think he is probably euvolemic based on chest xray BUN cr. Should be able to go to rehab at this point Will need close follow up with Dr Early with right toes  Fabienne Bruns, MD [facility] [place] Office: [phone] Pager: [phone]

## 2012-08-15 NOTE — Discharge Summary (Signed)
[facility] Discharge Summary   Patient ID:  [name] [name] MRN: [id number] DOB/AGE: [birth year] 79 y.o.  Admit date: 08/09/2012 Discharge date: 08/17/12 Date of Surgery: 08/09/2012 Surgeon: Surgeon(s): Larina Earthly, MD  Admission Diagnosis: ISCHEMIC FOOT  Discharge Diagnoses:  ISCHEMIC FOOT  Secondary Diagnoses: Past Medical History  Diagnosis Date  . HTN (hypertension)   . Dyslipidemia   . CVA (cerebral vascular accident)     1997, affected right side  . AAA (abdominal aortic aneurysm)     Also aneurysms of the infrarenal aorta and iliac arteries.. followed Dr. Edilia Bo  . Renal artery stenosis     Noted, 2005, medical therapy  . Tobacco abuse   . CAD (coronary artery disease)     Nuclear, August 28, 2011, EF 60%, small fixed defect at the apex consistent with apical thinning and moderate fixed inferobasal defect compatible with prior infarct, no ischemia  . Hx of CABG     2005  . Ejection fraction     Normal,, 2005,( no echo data as of May, 201 2)  . Neuropathy     Right foot  . Myocardial infarction 2005  . Leg pain   . Abdominal pain   . Hyperlipidemia   . Nervousness(799.21)   . Peripheral vascular disease   . Carotid artery occlusion     Procedure(s): AORTA BIFEMORAL BYPASS GRAFT  Discharged Condition: stable  HPI: [name] BRUMETT is a 79 y.o. male with HX AAA, CAD S/P CABG, HTN and CVA, and renal artery stenosis who is being followed in our office for PAD in right leg and AAA. Pt states he has had claudication in the right leg for aprox 2 years. He developed worsening pain in the right leg with rest and night pain over the last week. He was seen in our office today and ABI's were now 0.4 on the right. He is admitted for Angiogram by Dr. 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 He has disease in his common femoral artery but a patent proximal  superficial femoral and profundus femoris artery. There is also a high-grade stenosis in his left iliac artery. He does have a 5.3 cm abdominal aortic aneurysm and bilateral common iliac artery aneurysms. I discussed this at length with the patient and his family and recommended aortobifemoral bypass grafting for relief of his profound ischemia in his right foot and also treatment of his 5.3 cm aneurysm. He understands procedure risks including a prolonged recovery time and is ready for surgery  Pt has hx of CAD/CABG COPD as a former smoker CRI   [name] [name] is a 79 y.o. male is S/P AORTA BIFEMORAL BYPASS GRAFT Extubated: POD # 0 Physical exam: Lungs with wheezing - controlled with inhalers Heart RRR ABD-soft with NABS Post-op wounds healing well RLE - foot ruberous  Toes tender- monophasic DP -stable Pt. Ambulating, voiding and taking PO diet without difficulty. Pt pain controlled with PO pain meds. Labs as below Complications:post-op constipation relieved with stool softeners COPD - seen by Pulm sx - improved with nebulizer tx  Pt with fluid overload and is diuresed with Lasix and potassium supplemented.  Pt to be transferred to inpt rehab  Consults:  Treatment Team:  Sherren Kerns, MD  Significant Diagnostic Studies: CBC    Component Value Date/Time   WBC 10.6* 08/19/2012 0455   RBC 3.16* 08/19/2012 0455   HGB 9.5* 08/19/2012  0455   HCT 29.5* 08/19/2012 0455   PLT 334 08/19/2012 0455   MCV 93.4 08/19/2012 0455   MCH 30.1 08/19/2012 0455   MCHC 32.2 08/19/2012 0455   RDW 14.7 08/19/2012 0455   LYMPHSABS 2.1 02/03/2011 1155   MONOABS 0.7 02/03/2011 1155   EOSABS 0.3 02/03/2011 1155   BASOSABS 0.1 02/03/2011 1155    BMET    Component Value Date/Time   NA 139 08/19/2012 0455   K 4.1 08/19/2012 0455   CL 104 08/19/2012 0455   CO2 29 08/19/2012 0455   GLUCOSE 111* 08/19/2012 0455   BUN 25* 08/19/2012 0455   CREATININE 1.52* 08/19/2012 0455   CALCIUM 8.8 08/19/2012 0455    GFRNONAA 42* 08/19/2012 0455   GFRAA 49* 08/19/2012 0455     COAG Lab Results  Component Value Date   INR 1.15 08/09/2012   INR 0.95 08/07/2012     Medication List    STOP taking these medications       pioglitazone 15 MG tablet  Commonly known as:  ACTOS      TAKE these medications       albuterol (5 MG/ML) 0.5% nebulizer solution  Commonly known as:  PROVENTIL  Take 0.5 mLs (2.5 mg total) by nebulization as needed for wheezing or shortness of breath.     aspirin EC 81 MG tablet  Take 162 mg by mouth daily.     atorvastatin 40 MG tablet  Commonly known as:  LIPITOR  Take 40 mg by mouth daily.     carvedilol 6.25 MG tablet  Commonly known as:  COREG  Take 1 tablet (6.25 mg total) by mouth 2 (two) times daily with a meal.     chlorthalidone 25 MG tablet  Commonly known as:  HYGROTON  Take 25 mg by mouth every other day.     cholecalciferol 1000 UNITS tablet  Commonly known as:  VITAMIN D  Take 1,000 Units by mouth daily.     enoxaparin 30 MG/0.3ML injection  Commonly known as:  LOVENOX  Inject 0.3 mLs (30 mg total) into the skin daily.     HYDROcodone-acetaminophen 5-325 MG per tablet  Commonly known as:  NORCO/VICODIN  Take 1-2 tablets by mouth every 6 (six) hours as needed for pain. TAKE 1-2 TABS EVERY 6 HRS PRN/ PAIN     lisinopril 20 MG tablet  Commonly known as:  PRINIVIL,ZESTRIL  Take 1 tablet (20 mg total) by mouth daily.     mometasone-formoterol 100-5 MCG/ACT Aero  Commonly known as:  DULERA  Inhale 2 puffs into the lungs 2 (two) times daily.     tolterodine 4 MG 24 hr capsule  Commonly known as:  DETROL LA  Take 4 mg by mouth every other day.     traMADol 50 MG tablet  Commonly known as:  ULTRAM  Take 50 mg by mouth every 6 (six) hours as needed for pain. For pain     vitamin B-12 100 MCG tablet  Commonly known as:  CYANOCOBALAMIN  Take 50 mcg by mouth daily.     zolpidem 10 MG tablet  Commonly known as:  AMBIEN  Take 10 mg by mouth at  bedtime.        Diet:  ADA  Disposition:  Discharge to :Rehab  Verbal and written Discharge instructions given to the patient. Wound care per Discharge AVS  F/U with Dr Early 3 weeks - pt may need Fem-pop bypass in future if symptoms in right foot  do not improve  Signed: ROCZNIAK,REGINA J 08/15/2012, 2:12 PM

## 2012-08-15 NOTE — Progress Notes (Signed)
VASCULAR LAB PRELIMINARY  PRELIMINARY  PRELIMINARY  PRELIMINARY  Bilateral lower extremity venous duplex  completed.    Preliminary report:  No obvious evidence of DVT.  Technically limited by edema.  Mid to distal FV and calf veins not adequately visualized.  Thao Vanover, RVT 08/15/2012, 9:21 AM

## 2012-08-15 NOTE — Consult Note (Addendum)
PULMONARY/CCM CONSULT NOTE  Requesting MD/Service: Fields/VVS0 Date of admission: 6/06 Date of consult: 6/12 Reason for consultation: COPD   HPI:  58 M smoker of 0.5 - 1.5 PPD all of his adult life admitted 6/06 with RLE claudication and underwent aortobifem bypass by Dr Arbie Cookey on the day of admission. He is now ready for transfer to Rehab from surgical perspective. Primary service noted exertional dyspnea and requested PCCM eval to optimize mgmt of presumed COPD. Pt has been receiving PRN albuterol nebs during this hospitalization and reports that they help his dyspnea. He continues to have LE edema and pain and is more limited by this presently than by dyspnea. He has never had PFTs performed and has never been treated with bronchodilators. He indicates that he had asthma as a child. At baseline, he has minimal cough and has never had hemoptysis.   Past Medical History  Diagnosis Date  . HTN (hypertension)   . Dyslipidemia   . CVA (cerebral vascular accident)     1997, affected right side  . AAA (abdominal aortic aneurysm)     Also aneurysms of the infrarenal aorta and iliac arteries.. followed Dr. Edilia Bo  . Renal artery stenosis     Noted, 2005, medical therapy  . Tobacco abuse   . CAD (coronary artery disease)     Nuclear, August 28, 2011, EF 60%, small fixed defect at the apex consistent with apical thinning and moderate fixed inferobasal defect compatible with prior infarct, no ischemia  . Hx of CABG     2005  . Ejection fraction     Normal,, 2005,( no echo data as of May, 201 2)  . Neuropathy     Right foot  . Myocardial infarction 2005  . Leg pain   . Abdominal pain   . Hyperlipidemia   . Nervousness(799.21)   . Peripheral vascular disease   . Carotid artery occlusion     MEDICATIONS: reviewed  History   Social History  . Marital Status: Married    Spouse Name: N/A    Number of Children: N/A  . Years of Education: N/A   Occupational History  . retired     Social History Main Topics  . Smoking status: Current Every Day Smoker -- 0.50 packs/day for 50 years    Types: Cigarettes  . Smokeless tobacco: Not on file  . Alcohol Use: Yes     Comment: occasionally  . Drug Use: No  . Sexually Active: Not on file   Other Topics Concern  . Not on file   Social History Narrative  . No narrative on file    History reviewed. No pertinent family history.  ROS - as per HPI. Otherwise negative  Filed Vitals:   08/14/12 1345 08/14/12 2037 08/15/12 0355 08/15/12 1331  BP: 107/61 118/59 144/67 108/58  Pulse: 65 76 78 75  Temp: 98.3 F (36.8 C) 97.8 F (36.6 C) 97.8 F (36.6 C) 97.8 F (36.6 C)  TempSrc: Oral Oral Oral Oral  Resp: 17 18 18 18   Height:      Weight:      SpO2: 97% 97% 98% 95%    EXAM:  Gen: Mild appearance of rest dyspnea, pleasant HEENT: WNL Neck: No JVD Lungs: clear without wheezes Cardiovascular: RRR s M Abdomen: Soft, NT, NABD Ext: BLE pedal and ankle edema Neuro: intact Skin:   DATA:  Lab data reviewed  CXR: NACPD:   IMPRESSION:   Longstanding smoker Mild dyspnea Presumed COPD - no acute bronchospasm  H/O childhood asthma  PLAN:  Counseled in detail re: smoking cessation Maintenance therapy with Advair PRN albuterol OK for transfer to Rehab from Pulm perspective Please schedule follow up with [facility] after discharge for formal eval  [phone] (MD's line)  [phone] (pt's line)  Please call if we can be of further assistance  Billy Fischer, MD ; [facility] service Mobile [phone].  After 5:30 PM or weekends, call [phone]

## 2012-08-15 NOTE — Progress Notes (Addendum)
Following pt for potential CIR admit.  Await results of Pulm consult.  Will need to consider for possible admit Friday d/t no bed available this afternoon.  [phone]

## 2012-08-16 ENCOUNTER — Encounter (HOSPITAL_COMMUNITY): Payer: Self-pay | Admitting: Physician Assistant

## 2012-08-16 ENCOUNTER — Telehealth: Payer: Self-pay | Admitting: Vascular Surgery

## 2012-08-16 DIAGNOSIS — I714 Abdominal aortic aneurysm, without rupture: Principal | ICD-10-CM

## 2012-08-16 DIAGNOSIS — I441 Atrioventricular block, second degree: Secondary | ICD-10-CM

## 2012-08-16 LAB — GLUCOSE, CAPILLARY
Glucose-Capillary: 163 mg/dL — ABNORMAL HIGH (ref 70–99)
Glucose-Capillary: 109 mg/dL — ABNORMAL HIGH (ref 70–99)

## 2012-08-16 MED ORDER — CARVEDILOL 6.25 MG PO TABS
6.2500 mg | ORAL_TABLET | Freq: Two times a day (BID) | ORAL | Status: DC
  Administered 2012-08-16 – 2012-08-17 (×2): 6.25 mg via ORAL
  Filled 2012-08-16 (×7): qty 1

## 2012-08-16 NOTE — Progress Notes (Signed)
DR. Imogene Burn PAGED AND CALL RETURNED AT 0405 . M.D. MADE AWARE OF LOW H.R. AND 2 DEGREE TYPE 2 IN THE UPPER 20'S TO UPPER 30'S FOR SHORT PERIOD OF TIME. PATIENT WAS SLEEPING.

## 2012-08-16 NOTE — Progress Notes (Signed)
Continue to follow pt for possible CIR admit.  Await medical readiness.  [phone]

## 2012-08-16 NOTE — Consult Note (Signed)
CARDIOLOGY CONSULT NOTE   Patient ID: [name] [name] MRN: [id number] DOB/AGE: 77/08/1931 77 y.o.  Admit date: 08/09/2012  Primary Physician   Pearson Grippe, MD Primary Cardiologist    JK Reason for Consultation   Bradycardia  [name] [name] is a 79 y.o. male with a history of HTN, CAD s/p CABG (2005), hyperlipidemia, AAA, CVA, renal artery stenosis and COPD. He was admitted for angiogram 08/08/2010 for worsening claudication in his right leg. It revealed complete occlusion of his right iliac artery, and a high grade stenosis in his left iliac artery. He has a known 5.3 cm AAA and bilateral common iliac artery aneurysms. On 08/09/2012, he had aortobifemoral bypass with 18 x 9 Hemashield graft with extensive right femoral endarterectomy, resection of abdominal aortic aneurysm.  Overnight last night at 2:37 am [name] was down to 28, 2 DEGREE TYPE 1 HEART BLOCK for just a few seconds then back UP TO S.B AND S.R. RATE 50-60'S.   Cardiology was consulted for sinus brady with heart block and Coreg was held.  He reports that he snores but only recalls waking up gasping for air once every couple of months. His wife does not recall ever hearing him quit breathing. He does occasionally have brief palpitations that he associates with exertional activity. He has not had any syncopal episodes or dizziness.  He denies orthopnea and chest pain. He has no recent history of falls. ER visit for weakness in 2012 possibly secondary to accidentally doubling medications. His ambulation has been poor since surgery, partly secondary to LE edema.   Pt had surgery 6/6, was restarted on his Coreg 25 mg once daily on 6/10. Since being restarted on his Coreg, [name] has trended down at night but no HR sustained below 50 and no documented heart block until last PM.    Past Medical History  Diagnosis Date  . HTN (hypertension)   . Dyslipidemia   . CVA (cerebral vascular accident)     1997, affected right side  . AAA  (abdominal aortic aneurysm)     Also aneurysms of the infrarenal aorta and iliac arteries.. followed Dr. Edilia Bo  . Renal artery stenosis     Noted, 2005, medical therapy  . Tobacco abuse   . CAD (coronary artery disease)     Nuclear, August 28, 2011, EF 60%, small fixed defect at the apex consistent with apical thinning and moderate fixed inferobasal defect compatible with prior infarct, no ischemia  . Hx of CABG     2005  . Ejection fraction 2012    EF 65-70%   . Neuropathy     Right foot  . Myocardial infarction 2005  . Leg pain   . Abdominal pain   . Hyperlipidemia   . Nervousness(799.21)   . Peripheral vascular disease   . Carotid artery occlusion   . COPD (chronic obstructive pulmonary disease) 08/15/2012     Past Surgical History  Procedure Laterality Date  . Coronary artery bypass graft  2005    x4  . Spine surgery  1977    Lumbar HNP surgery  . Aorta - bilateral femoral artery bypass graft N/A 08/09/2012    Procedure: AORTA BIFEMORAL BYPASS GRAFT;  Surgeon: Larina Earthly, MD;  Location: [facility] OR;  Service: Vascular;  Laterality: N/A;    No Known Allergies  I have reviewed the patient's current medications . albuterol  2.5 mg Nebulization Once  . aspirin EC  162 mg Oral Daily  . atorvastatin  40 mg Oral q1800  . carvedilol  25 mg Oral Q breakfast  . fesoterodine  4 mg Oral Daily  . insulin aspart  0-15 Units Subcutaneous TID WC  . lisinopril  10 mg Oral Daily  . mometasone-formoterol  2 puff Inhalation BID  . pantoprazole  40 mg Oral Q1200  . senna-docusate  2 tablet Oral QHS  . zolpidem  5 mg Oral QHS     acetaminophen, acetaminophen, albuterol, ALPRAZolam, bisacodyl, hydrALAZINE, HYDROcodone-acetaminophen, labetalol, magnesium sulfate 1 - 4 g bolus IVPB, metoprolol, morphine injection, ondansetron, phenol, pneumococcal 23 valent vaccine, potassium chloride, traMADol  Medication Sig Start Date  aspirin EC 81 MG tablet Take 162 mg by mouth daily.    atorvastatin  (LIPITOR) 40 MG tablet Take 40 mg by mouth daily.    carvedilol (COREG) 25 MG tablet Take 25 mg by mouth daily.  2010  chlorthalidone (HYGROTON) 25 MG tablet Take 25 mg by mouth every other day.    cholecalciferol (VITAMIN D) 1000 UNITS tablet Take 1,000 Units by mouth daily.    HYDROcodone-acetaminophen (NORCO/VICODIN) 5-325 MG per tablet Take 1-2 tablets by mouth every 6 (six) hours as needed for pain. TAKE 1-2 TABS EVERY 6 HRS PRN/ PAIN 08/07/12  lisinopril (PRINIVIL,ZESTRIL) 10 MG tablet Take 10 mg by mouth daily. 07/26/12  pioglitazone (ACTOS) 15 MG tablet Take 15 mg by mouth daily.    tolterodine (DETROL LA) 4 MG 24 hr capsule Take 4 mg by mouth every other day.    traMADol (ULTRAM) 50 MG tablet Take 50 mg by mouth every 6 (six) hours as needed for pain. For pain 10/26/10  vitamin B-12 (CYANOCOBALAMIN) 100 MCG tablet Take 50 mcg by mouth daily.    zolpidem (AMBIEN) 10 MG tablet Take 10 mg by mouth at bedtime.       History   Social History  . Marital Status: Married    Spouse Name: N/A    Number of Children: N/A  . Years of Education: N/A   Occupational History  . retired    Social History Main Topics  . Smoking status: Current Every Day Smoker -- 0.50 packs/day for 50 years    Types: Cigarettes  . Smokeless tobacco: Not on file  . Alcohol Use: Yes     Comment: occasionally  . Drug Use: No  . Sexually Active: Not on file   Other Topics Concern  . Not on file   Social History Narrative  . Lives with wife.    Family Status  Relation Status Death Age  . Mother Deceased 96    Tuberculosis  . Father Deceased    ROS:  Full 14 point review of systems complete and found to be negative unless listed above.  Physical Exam: Blood pressure 146/64, pulse 76, temperature 98.1 F (36.7 C), temperature source Oral, resp. rate 21, height 5\' 10"  (1.778 m), weight 220 lb 7.4 oz (100 kg), SpO2 98.00%.  General: Well developed, well nourished, male in no acute distress Head: Eyes  PERRLA, No xanthomas.   Normocephalic and atraumatic, oropharynx without edema or exudate. Dentition: poor Lungs:  expiratory wheezes, no crackles  Heart: HRRR S1 S2, no rub/gallop, no murmur. pulses are 2+ upper extrem.   Neck: No carotid bruits. No lymphadenopathy.  JVD approx 8 cm. Abdomen: Bowel sounds present but decreased, abdomen distended and tender (?tympanic?) without masses or hernias noted. Msk:  No spine or cva tenderness. Generalized weakness, no joint deformities or effusions. Extremities: No clubbing or cyanosis.  2+  edema. Non palpable DP  Neuro: Alert and oriented X 3. No focal deficits noted. Psych:  Good affect, responds appropriately Skin: No rashes or lesions noted.  Labs:   Lab Results  Component Value Date   WBC 10.0 08/15/2012   HGB 9.2* 08/15/2012   HCT 28.7* 08/15/2012   MCV 93.2 08/15/2012   PLT 232 08/15/2012   No results found for this basename: INR,  in the last 72 hours  Recent Labs Lab 08/10/12 0400  08/15/12 0455  NA 138  < > 140  K 4.3  < > 3.5  CL 106  < > 108  CO2 21  < > 24  BUN 35*  < > 34*  CREATININE 1.90*  < > 1.51*  CALCIUM 8.4  < > 8.5  PROT 6.0  --   --   BILITOT 0.7  --   --   ALKPHOS 47  --   --   ALT 15  --   --   AST 26  --   --   GLUCOSE 136*  < > 165*  < > = values in this interval not displayed. Magnesium  Date Value Range Status  08/10/2012 1.7  1.5 - 2.5 mg/dL Final   Amylase  Date/Time Value Range Status  08/10/2012  4:00 AM 41  0 - 105 U/L Final   Echo: 08/12/2010  History: PMH: Renal artery stenosis. Acquired from the patient and from the patient's chart. Coronary artery disease. Stroke. Transient ischemic attack. Transient ischemic attack. Risk factors: Current tobacco use. Hypertension. Dyslipidemia. Study Conclusions - Left ventricle: The cavity size was normal. Systolic function was vigorous. The estimated ejection fraction was in the range of 65% to 70%. Wall motion was normal; there were no regional wall  motion abnormalities. - Left atrium: The atrium was mildly dilated. - Atrial septum: No defect or patent foramen ovale was identified.  ECG:  08/22/2011: nsr, 76bpm, 1st degree AV blcok, left axis deviation  Radiology:  Dg Chest Port 1 View 08/15/2012   *RADIOLOGY REPORT*  Clinical Data: Wheezing.  PORTABLE CHEST - 1 VIEW  Comparison: 08/11/2012  Findings: Semi upright view of the chest was obtained.  There is no focal airspace disease or evidence of edema.  Median sternotomy wires are present.  The right jugular central line has been removed.  No evidence for a pneumothorax.  Heart size is within normal limits.  IMPRESSION: No acute chest findings.   Original Report Authenticated By: Richarda Overlie, M.D.    ASSESSMENT AND PLAN:   The patient was seen today by Dr Elease Hashimoto, the patient evaluated and the data reviewed.    Heart block AV second degree - episodic: Pt without discernable symptoms, was taking BID Coreg once daily for the last 3-4 years. His Coreg is on hold for now. He is not on any other rate-lowering medications. MD advise on restarting Coreg at lowest dose if HR improves off BB and continuing to use BID med once daily. Coreg is not renally cleared so renal function not an issue. It is metabolized in the liver and LFT's  were OK on admission. Will recheck and ECG and do CMET in am. MD advise on TSH.   Principal Problem:   Peripheral vascular disease Active Problems:   HTN (hypertension)   AAA (abdominal aortic aneurysm)   Smoker   COPD (chronic obstructive pulmonary disease)   Dyspnea   Signed: Theodore Demark, PA-C 08/16/2012 10:38 AM Beeper [phone]  Co-Sign MD   Attending  Note:   The patient was seen and examined.  Agree with assessment and plan as noted above.  Changes made to the above note as needed.  I have reviewed the rhythm strips.  He has Wencheback AV block which is a benign AV block.  He has 1st degree AV block at baseline.  He does not need a pacer.  I would  lower his dose of coreg and split into BID dosing.  Otherwise no new recs.  Will sign off.  Call for questions.   Vesta Mixer, Montez Hageman., MD, [facility] 08/16/2012, 12:29 PM

## 2012-08-16 NOTE — Progress Notes (Addendum)
PATIENT SLEEPING AND H.R. DOWN TO 28  2 DEGREE TYPE 2 HEART BLOCK JUST FOR A FEW SECONDS THEN BACK UP TO S.B AND S.R. RATE 50-60'S.  PATIENT IS ON COREG AND HE STATES AS FAR AS HE KNOWS DOES NOT HAVE A SLOW H.R. . PATIENT WAS AWAKEN . WITH NO COMPLAINTS SKIN WARM AND DRY.Marland Kitchen CHARGE R.N. AWARE. CONT. TO MONITOR PATIENT, RHYTHM, RATE. DR. Imogene Burn PAGED AT 03:25 AWAITING RETURN CALL.

## 2012-08-16 NOTE — Progress Notes (Addendum)
[facility] [place] [facility]  Post-op  Intra-abdominal Surgery note  Date of Surgery: 08/09/2012  Surgeon(s): Larina Earthly, MD  7 Days Post-Op Procedure(s): AORTA BIFEMORAL BYPASS GRAFT  History of Present Illness  [name] [name] is a 79 y.o. male who is  up s/p Procedure(s): AORTA BIFEMORAL BYPASS GRAFT Pt is doing well. denies incisional pain; denies nausea/vomiting; denies diarrhea. has had flatus;has had BM.  He still has significant right foot pain with weight bearing and touch.  IMAGING: Dg Chest Port 1 View  08/15/2012   *RADIOLOGY REPORT*  Clinical Data: Wheezing.  PORTABLE CHEST - 1 VIEW  Comparison: 08/11/2012  Findings: Semi upright view of the chest was obtained.  There is no focal airspace disease or evidence of edema.  Median sternotomy wires are present.  The right jugular central line has been removed.  No evidence for a pneumothorax.  Heart size is within normal limits.  IMPRESSION: No acute chest findings.   Original Report Authenticated By: Richarda Overlie, M.D.    Significant Diagnostic Studies: CBC Lab Results  Component Value Date   WBC 10.0 08/15/2012   HGB 9.2* 08/15/2012   HCT 28.7* 08/15/2012   MCV 93.2 08/15/2012   PLT 232 08/15/2012    BMET    Component Value Date/Time   NA 140 08/15/2012 0455   K 3.5 08/15/2012 0455   CL 108 08/15/2012 0455   CO2 24 08/15/2012 0455   GLUCOSE 165* 08/15/2012 0455   BUN 34* 08/15/2012 0455   CREATININE 1.51* 08/15/2012 0455   CALCIUM 8.5 08/15/2012 0455   GFRNONAA 42* 08/15/2012 0455   GFRAA 49* 08/15/2012 0455    COAG Lab Results  Component Value Date   INR 1.15 08/09/2012   INR 0.95 08/07/2012   No results found for this basename: PTT    I/O last 3 completed shifts: In: 840 [P.O.:840] Out: 1675 [Urine:1675]    Physical Examination BP Readings from Last 3 Encounters:  08/16/12 146/64  08/16/12 146/64  08/07/12 151/74   Temp Readings from Last 3 Encounters:  08/16/12 98.1 F (36.7 C) Oral  08/16/12  98.1 F (36.7 C) Oral  08/07/12 98 F (36.7 C) Oral   SpO2 Readings from Last 3 Encounters:  08/16/12 98%  08/16/12 98%  08/07/12 97%   Pulse Readings from Last 3 Encounters:  08/16/12 76  08/16/12 76  08/07/12 77    General: A&O x 3, WDWN male in NAD Pulmonary: normal non-labored breathing , without Rales, rhonchi,  wheezing Cardiac: Heart rate : 2:37 am H.R. DOWN TO 28 2 DEGREE TYPE 2 HEART BLOCK JUST FOR A FEW SECONDS THEN BACK UP TO S.B AND S.R. RATE 50-60'S.  Abdomen:non-tender Abdominal wound:clean, dry, intact  Neurologic: A&O X 3; Appropriate Affect ;  Speech is fluent/normal  Vascular Exam:BLE warm and well perfused.  Right doppler PT/DP.  Toes are painful to touch and erythematous.  No openings in the skin.   Non-Invasive Vascular Imaging ABI'S: Right 0.38; Left 0.63 Venous duplex neg. DVT Assessment/Plan: [name] [name] is a 79 y.o. male who is 7 Days Post-Op Procedure(s): AORTA BIFEMORAL BYPASS GRAFT Called in cardiology consult [place]  For sinus brady with heart block and held coreg.      Clinton Gallant [facility]  08/16/2012 8:15 AM   Pt does not feel as good today Cardiology eval in progress for heart block Right foot toe erythema stable and foot actually looks as good as I have seen it All incisions healing Need  to float heels off bed to prevent pressure sores especially left heel  Fabienne Bruns, MD [facility] [place] Office: [phone] Pager: [phone]

## 2012-08-16 NOTE — Progress Notes (Signed)
Chaplain Note:  Met with patient at his request. I have known patient for many years. He expressed his concern for how slow his recovery is going and some concern that " if I could get two good days back to back I think I would feel better." He understands that he needs to go to Rehab to regain strength and is hoping that he will progress quickly.  He did acknowledge that he is receiving very good care and is hoping that he the pain will begin to subside soon. He has strong support from his family and church.   Dyke Maes [phone]

## 2012-08-16 NOTE — Progress Notes (Addendum)
Spoke w/ [name] Demark, PA-C, [facility]-the plan is for pt to remain on telemetry tonight w/ f/up tomorrow about medical readiness for CIR.   Rehab MD will see pt tomorrow.  If he does not admit to CIR this wkend, will check on him Monday.  [phone]

## 2012-08-16 NOTE — Telephone Encounter (Signed)
Message copied by Jena Gauss on Fri Aug 16, 2012 10:48 AM ------      Message from: Marlowe Shores      Created: Thu Aug 15, 2012  2:25 PM       3 week F/U aortobifem bypass - Early ------

## 2012-08-16 NOTE — Progress Notes (Signed)
Patient is having periods of sleep apnea 15-20 seconds.

## 2012-08-16 NOTE — PMR Pre-admission (Addendum)
PMR Admission Coordinator Pre-Admission Assessment  Patient: [name] [name] is an 79 y.o., male MRN: [id number] DOB: 11-11-1931 Height: 5\' 10"  (177.8 cm) Weight: 97 kg (213 lb 13.5 oz)              Insurance Information HMO:     PPO:      PCP:      IPA:      80/20: yes     OTHER: no HMO PRIMARY: Medicare A and B      Policy#: [id number]      Subscriber: pt Benefits:  Phone #: [email or web address]     Name: 08/12/12 Eff. Date: 12/04/97     Deduct: $1216      Out of Pocket Max: none      Life Max: none CIR: 100%      SNF: 20 full days LBD 11/18/03 Outpatient: 80%     Co-Pay: 20% Home Health: 100%      Co-Pay: none DME: 80%     Co-Pay: 20% Providers: in network  SECONDARY: Federal BCBS      Policy#: [id number]      Subscriber: pt No auth with medicare primary  Emergency Contact Information Contact Information   Name Relation Home Work Mobile   Zinedine, Ellner [phone]     Current Medical History  Patient Admitting Diagnosis: Deconditioning after abdominal aortic aneurysm repair and right femoral endarterectomy  History of Present Illness: [name] [name] is a 79 y.o. male with history of HTN, CVA, AAA, tobacco abuse, PAD with severe right foot pain with limb threatening ischemia. He was admitted on 08/09/12 for AA with extensive right femoral endarterectomy and resection of AAA by Dr. Arbie Cookey. Post op with abdominal pain due to ileus. NGT discontinued. On clear liquids until 6/10 and now on regular diet 6/11. Eating little. One small BM and passing gas. Given Mg Citrate at lunch today with results. Denies nausea.  Therapies initiated and patient has decreased endurance as well as difficulty weight bearing on R-foot due to pain.  Low energy level.  Rales wheezing on 6/11 with some fluid overload. Given IV lasix.  Seen & cleared by pulmonary 6/12.     Pending clearance by cardiology.  Cleared 08/19/12 by cardiology [Dr. Jens Som.  Past Medical History  Past  Medical History  Diagnosis Date  . HTN (hypertension)   . Dyslipidemia   . CVA (cerebral vascular accident)     1997, affected right side  . AAA (abdominal aortic aneurysm)     Also aneurysms of the infrarenal aorta and iliac arteries.. followed Dr. Edilia Bo  . Renal artery stenosis     Noted, 2005, medical therapy  . Tobacco abuse   . CAD (coronary artery disease)     Nuclear, August 28, 2011, EF 60%, small fixed defect at the apex consistent with apical thinning and moderate fixed inferobasal defect compatible with prior infarct, no ischemia  . Hx of CABG     2005  . Ejection fraction 2012    EF 65-70%   . Neuropathy     Right foot  . Myocardial infarction 2005  . Leg pain   . Abdominal pain   . Hyperlipidemia   . Nervousness(799.21)   . Peripheral vascular disease   . Carotid artery occlusion   . COPD (chronic obstructive pulmonary disease) 08/15/2012    Family History  family history is not on file.  Prior Rehab/Hospitalizations: none   Current Medications  Current facility-administered medications:acetaminophen (TYLENOL) suppository 325-650  mg, 325-650 mg, Rectal, Q4H PRN, Amelia Jo Roczniak, PA-C;  acetaminophen (TYLENOL) tablet 325-650 mg, 325-650 mg, Oral, Q4H PRN, Regina J Roczniak, PA-C;  albuterol (PROVENTIL) (5 MG/ML) 0.5% nebulizer solution 2.5 mg, 2.5 mg, Nebulization, Once, John P Day, RRT albuterol (PROVENTIL) (5 MG/ML) 0.5% nebulizer solution 2.5 mg, 2.5 mg, Nebulization, PRN, John P Day, RRT, 2.5 mg at 08/15/12 2022;  ALPRAZolam (XANAX) tablet 0.25 mg, 0.25 mg, Oral, TID PRN, Regina J Roczniak, PA-C;  aspirin EC tablet 162 mg, 162 mg, Oral, Daily, Samantha J Rhyne, PA-C, 162 mg at 08/19/12 1109;  atorvastatin (LIPITOR) tablet 40 mg, 40 mg, Oral, q1800, Samantha J Rhyne, PA-C, 40 mg at 08/18/12 1654 bisacodyl (DULCOLAX) suppository 10 mg, 10 mg, Rectal, Daily PRN, Regina J Roczniak, PA-C;  bisacodyl (DULCOLAX) suppository 10 mg, 10 mg, Rectal, Once, Samantha J Rhyne,  PA-C;  enoxaparin (LOVENOX) injection 30 mg, 30 mg, Subcutaneous, Q24H, Samantha J Rhyne, PA-C, 30 mg at 08/19/12 1109;  fesoterodine (TOVIAZ) tablet 4 mg, 4 mg, Oral, Daily, Samantha J Rhyne, PA-C, 4 mg at 08/19/12 1109 furosemide (LASIX) injection 20 mg, 20 mg, Intravenous, Once, Samantha J Rhyne, PA-C;  furosemide (LASIX) tablet 20 mg, 20 mg, Oral, Q breakfast, Lewayne Bunting, MD, 20 mg at 08/19/12 1109;  hydrALAZINE (APRESOLINE) injection 10 mg, 10 mg, Intravenous, Q2H PRN, Regina J Roczniak, PA-C;  HYDROcodone-acetaminophen (NORCO/VICODIN) 5-325 MG per tablet 1-2 tablet, 1-2 tablet, Oral, Q6H PRN, [place] Rhyne, PA-C, 2 tablet at 08/19/12 1212 insulin aspart (novoLOG) injection 0-15 Units, 0-15 Units, Subcutaneous, TID WC, Regina J Roczniak, PA-C, 2 Units at 08/19/12 1202;  lactulose (CHRONULAC) 10 GM/15ML solution 30 g, 30 g, Oral, Once, Samantha J Rhyne, PA-C;  lisinopril (PRINIVIL,ZESTRIL) tablet 20 mg, 20 mg, Oral, Daily, Lewayne Bunting, MD, 20 mg at 08/19/12 1109;  magnesium sulfate IVPB 2 g 50 mL, 2 g, Intravenous, Daily PRN, Regina J Roczniak, PA-C mometasone-formoterol (DULERA) 100-5 MCG/ACT inhaler 2 puff, 2 puff, Inhalation, BID, Merwyn Katos, MD, 2 puff at 08/19/12 0935;  morphine 2 MG/ML injection 2-5 mg, 2-5 mg, Intravenous, Q1H PRN, Amelia Jo Roczniak, PA-C, 2 mg at 08/14/12 0431;  ondansetron (ZOFRAN) injection 4 mg, 4 mg, Intravenous, Q6H PRN, Regina J Roczniak, PA-C;  pantoprazole (PROTONIX) EC tablet 40 mg, 40 mg, Oral, Q1200, Judie Bonus Hammons, RPH, 40 mg at 08/19/12 1109 phenol (CHLORASEPTIC) mouth spray 1 spray, 1 spray, Mouth/Throat, PRN, Amelia Jo Roczniak, PA-C;  pneumococcal 23 valent vaccine (PNU-IMMUNE) injection 0.5 mL, 0.5 mL, Intramuscular, Prior to discharge, Sherren Kerns, MD;  potassium chloride SA (K-DUR,KLOR-CON) CR tablet 20-40 mEq, 20-40 mEq, Oral, Daily PRN, Regina J Roczniak, PA-C senna-docusate (Senokot-S) tablet 2 tablet, 2 tablet, Oral, QHS,  Amelia Jo [place], PA-C, 2 tablet at 08/18/12 2249;  traMADol (ULTRAM) tablet 50 mg, 50 mg, Oral, Q6H PRN, Samantha J Rhyne, PA-C, 50 mg at 08/18/12 1137;  zolpidem (AMBIEN) tablet 5 mg, 5 mg, Oral, QHS, Larina Earthly, MD, 5 mg at 08/18/12 2249  Patients Current Diet: Cardiac  Precautions / Restrictions Precautions Precautions: Fall Restrictions Weight Bearing Restrictions: No   Prior Activity Level Limited Community (1-2x/wk): Patient reports going out to eat with his buddies at lunch a couple of times per week; Mod I w cane  Home Assistive Devices / Equipment Home Assistive Devices/Equipment: Cane (specify quad or straight) Home Adaptive Equipment: Walker - rolling  Prior Functional Level Prior Function Level of Independence: Independent with assistive device(s) Able to Take Stairs?: Yes Driving: Yes 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  Current Functional Level Cognition  Arousal/Alertness: Awake/alert Overall Cognitive Status: Within Functional Limits for tasks assessed Orientation Level: Oriented X4    Extremity Assessment (includes Sensation/Coordination)  RUE ROM/Strength/Tone: Deficits RUE ROM/Strength/Tone Deficits: R shoulder weakness from previous CVA  RLE ROM/Strength/Tone: Deficits;Due to pain RLE ROM/Strength/Tone Deficits: Unable to lift LE against gravity in supine osition; Difficulty with WBing ue to pain    ADLs  Eating/Feeding:  (ice chips only) Grooming: Supervision/safety Where Assessed - Grooming: Supported standing Upper Body Bathing: Minimal assistance Where Assessed - Upper Body Bathing: Supported sitting Lower Body Bathing: Maximal assistance Where Assessed - Lower Body Bathing: Supported sit to stand Upper Body Dressing: Supervision/safety;Set up Where Assessed - Upper Body Dressing: Unsupported sitting Lower Body Dressing: +1 Total assistance Where Assessed - Lower Body Dressing: Supported sit to stand Toilet Transfer:  Mining engineer: Patient Percentage: 50% Statistician Method: Sit to Barista: Other (comment) (stood and used urinal) Toileting - Architect and Hygiene: Maximal assistance Where Assessed - Engineer, mining and Hygiene: Sit to stand from 3-in-1 or toilet Equipment Used: Gait belt;Rolling walker Transfers/Ambulation Related to ADLs: min A ADL Comments: will address AE    Mobility  Bed Mobility: Supine to Sit;Sitting - Scoot to Edge of Bed Supine to Sit: 4: Min assist Sitting - Scoot to [place] of Bed: 4: Min guard    Transfers  Transfers: Sit to Stand;Stand to Sit Sit to Stand: 4: Min assist;From elevated surface;With upper extremity assist;From bed Stand to Sit: 4: Min guard;With armrests;To chair/3-in-1;With upper extremity assist    Ambulation / Gait / Stairs / Wheelchair Mobility  Ambulation/Gait Ambulation/Gait Assistance: 4: Min assist Ambulation/Gait: Patient Percentage: 80% Ambulation Distance (Feet): 80 Feet Assistive device: Rolling walker Ambulation/Gait Assistance Details: Patient continues with instability especially with stops and turns. Cues for RW manangement/safety Gait Pattern: Step-through pattern;Trunk flexed;Wide base of support Gait velocity: decreased General Gait Details: limited by fatigue Stairs: No Wheelchair Mobility Wheelchair Mobility: No    Posture / Balance Dynamic Sitting Balance Dynamic Sitting - Balance Support: No upper extremity supported;Feet supported Dynamic Sitting - Level of Assistance: 6: Modified independent (Device/Increase time) Static Standing Balance Static Standing - Balance Support: No upper extremity supported;During functional activity Static Standing - Level of Assistance: 4: Min assist High Level Balance High Level Balance Comments: grooming activities at sink    Special needs/care consideration Bowel mgmt: constipated. Ileus postop. Mg Citrate  6/11 with results 6/12 & lg BM 08/16/12 Bladder mgmt:continent    Previous Home Environment Living Arrangements: Spouse/significant other Lives With: Spouse Available Help at Discharge: Family;Available 24 hours/day (sons can help intermittently) Type of Home: House Home Layout: One level Home Access: Stairs to enter Entrance Stairs-Rails: Doctor, general practice of Steps: 7 (5 from the street, then 2 into house) Bathroom Shower/Tub: Engineer, manufacturing systems: Standard Bathroom Accessibility: Yes How Accessible: Accessible via walker Home Care Services: No  Discharge Living Setting Plans for Discharge Living Setting: Patient's home;Lives with (comment) (wife ; second marriage; married 27 years) Type of Home at Discharge: House Discharge Home Layout: One level Discharge Home Access: Stairs to enter Entrance Stairs-Rails: Doctor, general practice of Steps: 7 steps total; 2 then 5 steps Discharge Bathroom Shower/Tub: Tub/shower unit Discharge Bathroom Toilet: Standard Discharge Bathroom Accessibility: Yes How Accessible: Accessible via walker Do you have any problems obtaining your medications?: No  Social/Family/Support Systems Patient Roles: Spouse;Parent Contact Information: Miki Kins, wife Anticipated Caregiver: wife, others prn  Anticipated Caregiver's Contact Information: see above Ability/Limitations of Caregiver: supervision to min assist Caregiver Availability: 24/7 Discharge Plan Discussed with Primary Caregiver: Yes Is Caregiver In Agreement with Plan?: Yes Does Caregiver/Family have Issues with Lodging/Transportation while Pt is in Rehab?: No    Goals/Additional Needs Patient/Family Goal for Rehab: supervision to min assist with PT and OT Expected length of stay: ELOS 2 weeks Dietary Needs: began regular diet 6/11. bowel issues to date Pt/Family Agrees to Admission and willing to participate: Yes Program Orientation Provided &  Reviewed with Pt/Caregiver Including Roles  & Responsibilities: Yes   Decrease burden of Care through IP rehab admission: n/a  Possible need for SNF placement upon discharge:not anticipated   Patient Condition: This patient's medical and functional status has changed since the consult dated: 08/12/12 in which the Rehabilitation Physician determined and documented that the patient's condition is appropriate for intensive rehabilitative care in an inpatient rehabilitation facility. See "History of Present Illness" (above) for medical update. Functional changes are:Min A- limited by fatigue & pain & medical issues. Patient's medical and functional status update has been discussed with the Rehabilitation physician and patient remains appropriate for inpatient rehabilitation. Plan is to admit pt to Rehab today.  Preadmission Screen Completed By:  Brock Ra, 08/19/2012 12:30 PM ______________________________________________________________________   Discussed status with Dr. Riley Kill on 08/19/12 at 12:36pm and received telephone approval for admission today.  Admission Coordinator:  Brock Ra, time 12:36pm/Date 08/19/12

## 2012-08-17 DIAGNOSIS — I441 Atrioventricular block, second degree: Secondary | ICD-10-CM | POA: Diagnosis not present

## 2012-08-17 LAB — GLUCOSE, CAPILLARY
Glucose-Capillary: 139 mg/dL — ABNORMAL HIGH (ref 70–99)
Glucose-Capillary: 151 mg/dL — ABNORMAL HIGH (ref 70–99)
Glucose-Capillary: 115 mg/dL — ABNORMAL HIGH (ref 70–99)

## 2012-08-17 MED ORDER — POTASSIUM CHLORIDE CRYS ER 10 MEQ PO TBCR
10.0000 meq | EXTENDED_RELEASE_TABLET | Freq: Two times a day (BID) | ORAL | Status: DC
  Administered 2012-08-17 – 2012-08-18 (×3): 10 meq via ORAL
  Filled 2012-08-17 (×4): qty 1

## 2012-08-17 MED ORDER — MOMETASONE FURO-FORMOTEROL FUM 100-5 MCG/ACT IN AERO: 2.0000 | INHALATION_SPRAY | Freq: Two times a day (BID) | RESPIRATORY_TRACT | Status: DC

## 2012-08-17 MED ORDER — ENOXAPARIN SODIUM 30 MG/0.3ML ~~LOC~~ SOLN
30.0000 mg | SUBCUTANEOUS | Status: DC
  Administered 2012-08-17 – 2012-08-19 (×3): 30 mg via SUBCUTANEOUS
  Filled 2012-08-17 (×4): qty 0.3

## 2012-08-17 MED ORDER — CARVEDILOL 6.25 MG PO TABS: 6.2500 mg | ORAL_TABLET | Freq: Two times a day (BID) | ORAL | Status: DC

## 2012-08-17 MED ORDER — ALBUTEROL SULFATE (5 MG/ML) 0.5% IN NEBU: 2.5000 mg | INHALATION_SOLUTION | RESPIRATORY_TRACT | Status: DC | PRN

## 2012-08-17 MED ORDER — FUROSEMIDE 10 MG/ML IJ SOLN
20.0000 mg | Freq: Two times a day (BID) | INTRAMUSCULAR | Status: DC
  Filled 2012-08-17 (×2): qty 2

## 2012-08-17 MED ORDER — FUROSEMIDE 10 MG/ML IJ SOLN
INTRAMUSCULAR | Status: AC
  Filled 2012-08-17: qty 4

## 2012-08-17 MED ORDER — FUROSEMIDE 10 MG/ML IJ SOLN: 20.0000 mg | Freq: Two times a day (BID) | INTRAMUSCULAR | Status: DC

## 2012-08-17 MED ORDER — ENOXAPARIN SODIUM 30 MG/0.3ML ~~LOC~~ SOLN: 30.0000 mg | SUBCUTANEOUS | Status: DC

## 2012-08-17 MED ORDER — POTASSIUM CHLORIDE CRYS ER 10 MEQ PO TBCR: 10.0000 meq | EXTENDED_RELEASE_TABLET | Freq: Two times a day (BID) | ORAL | Status: DC

## 2012-08-17 NOTE — Progress Notes (Signed)
   Subjective:  Denies CP; dyspneic earlier now improved; complains of increased edema.   Objective:  Filed Vitals:   08/16/12 1938 08/16/12 2129 08/17/12 0500 08/17/12 0542  BP: 123/62   148/75  Pulse: 69   76  Temp: 97.9 F (36.6 C)   97.8 F (36.6 C)  TempSrc: Oral   Oral  Resp: 18   18  Height:      Weight:   218 lb 11.1 oz (99.2 kg) 218 lb 11.1 oz (99.2 kg)  SpO2: 97% 97%  98%    Intake/Output from previous day:  Intake/Output Summary (Last 24 hours) at 08/17/12 1018 Last data filed at 08/17/12 0543  Gross per 24 hour  Intake   1050 ml  Output    600 ml  Net    450 ml    Physical Exam: Physical exam: Well-developed well-nourished in no acute distress.  Skin is warm and dry.  HEENT is normal.  Neck is supple.  Chest is clear to auscultation with normal expansion.  Cardiovascular exam is regular rate and rhythm.  Abdominal exam s/p abdominal surgery Extremities show 1-2+ edema. neuro grossly intact    Lab Results: Basic Metabolic Panel:  Recent Labs  16/10/96 0455  NA 140  K 3.5  CL 108  CO2 24  GLUCOSE 165*  BUN 34*  CREATININE 1.51*  CALCIUM 8.5   CBC:  Recent Labs  08/15/12 0455  WBC 10.0  HGB 9.2*  HCT 28.7*  MCV 93.2  PLT 232    Assessment/Plan:  1) Bradycardia - patient continues to have bradycardia with intermittent Mobitz 1. However this is predominantly with sleep during early a.m. Hours. No indication for pacemaker. Continue present dose of beta blocker. 2) status post aortobifemoral bypass graft - management per vascular surgery. 3) postop volume excess-continue diuresis and follow renal function. He remains volume overloaded. 4) coronary artery disease status post coronary artery bypass graft-continue aspirin and statin.  Olga Millers 08/17/2012, 10:18 AM

## 2012-08-17 NOTE — Progress Notes (Addendum)
[facility] Progress Note  08/17/2012 7:38 AM 8 Days Post-Op  Subjective:  Feeling a little better today-states his right knee hurts  Afebrile HR 60's-70's 110's-140's 98% RA  Filed Vitals:   08/17/12 0542  BP: 148/75  Pulse: 76  Temp: 97.8 F (36.6 C)  Resp: 18    Physical Exam: Incisions:  C/d/i with staples in tact.  Groin incisions with staples as well and healing nicely. Extremities:  Bilateral feet are warm.  There is erythema on the right from mid toes and distally. Cardiac:  RRR Lungs: CTAB Abdomen:  Soft/NT/ND +BM  CBC    Component Value Date/Time   WBC 10.0 08/15/2012 0455   RBC 3.08* 08/15/2012 0455   HGB 9.2* 08/15/2012 0455   HCT 28.7* 08/15/2012 0455   PLT 232 08/15/2012 0455   MCV 93.2 08/15/2012 0455   MCH 29.9 08/15/2012 0455   MCHC 32.1 08/15/2012 0455   RDW 14.7 08/15/2012 0455   LYMPHSABS 2.1 02/03/2011 1155   MONOABS 0.7 02/03/2011 1155   EOSABS 0.3 02/03/2011 1155   BASOSABS 0.1 02/03/2011 1155    BMET    Component Value Date/Time   NA 140 08/15/2012 0455   K 3.5 08/15/2012 0455   CL 108 08/15/2012 0455   CO2 24 08/15/2012 0455   GLUCOSE 165* 08/15/2012 0455   BUN 34* 08/15/2012 0455   CREATININE 1.51* 08/15/2012 0455   CALCIUM 8.5 08/15/2012 0455   GFRNONAA 42* 08/15/2012 0455   GFRAA 49* 08/15/2012 0455    INR    Component Value Date/Time   INR 1.15 08/09/2012 1603     Intake/Output Summary (Last 24 hours) at 08/17/12 0738 Last data filed at 08/17/12 0543  Gross per 24 hour  Intake   1290 ml  Output    600 ml  Net    690 ml     Assessment/Plan:  79 y.o. male is s/p:  Aortobifemoral bypass with 18 x 9 Hemashield graft with extensive right femoral endarterectomy, resection of abdominal aortic aneurysm    8 Days Post-Op  -pt feels a little better today -pt right foot continues to improve-erythema only at mid toes and distally-still with pain, but improved. -pt fluid overload and up 10 lbs since 08/10/12 (wife states  he is up 20 lbs since admission on 08/09/12, but weights only go back a week for viewing)-will give lasix 20 mg IV q12h x 4 doses with Kdur 10 mEq q12h x 4 doses -check BMP in am -DVT prophylaxis:  SCD's- ? Renal dose lovenox-will d/w Dr. Hart [place] -continue to float heels -possible CIR today -per cardiology-pt does not need a pacer and split coreg to 6.25 mg bid   Doreatha Massed, PA-C [facility] [phone] 08/17/2012 7:38 AM    Agree with above assessment Once all healing nicely 3+ femoral pulses palpable bilaterally Chronic ischemia right foot is much improved-no rest pain in the foot today  Agree with diuresis and will transfer patient to CIR when available

## 2012-08-18 ENCOUNTER — Inpatient Hospital Stay (HOSPITAL_COMMUNITY): Payer: Medicare Other | Admitting: *Deleted

## 2012-08-18 ENCOUNTER — Inpatient Hospital Stay (HOSPITAL_COMMUNITY): Payer: Medicare Other | Admitting: Occupational Therapy

## 2012-08-18 DIAGNOSIS — I10 Essential (primary) hypertension: Secondary | ICD-10-CM

## 2012-08-18 DIAGNOSIS — I441 Atrioventricular block, second degree: Secondary | ICD-10-CM | POA: Diagnosis not present

## 2012-08-18 LAB — BASIC METABOLIC PANEL
CO2: 29 mEq/L (ref 19–32)
Calcium: 8.7 mg/dL (ref 8.4–10.5)
GFR calc non Af Amer: 41 mL/min — ABNORMAL LOW (ref >90)
Potassium: 3.8 mEq/L (ref 3.5–5.1)
Sodium: 139 mEq/L (ref 135–145)

## 2012-08-18 LAB — GLUCOSE, CAPILLARY
Glucose-Capillary: 119 mg/dL — ABNORMAL HIGH (ref 70–99)
Glucose-Capillary: 117 mg/dL — ABNORMAL HIGH (ref 70–99)
Glucose-Capillary: 144 mg/dL — ABNORMAL HIGH (ref 70–99)

## 2012-08-18 MED ORDER — BISACODYL 10 MG RE SUPP: 10.0000 mg | Freq: Once | RECTAL | Status: DC

## 2012-08-18 MED ORDER — FUROSEMIDE 10 MG/ML IJ SOLN
40.0000 mg | Freq: Every day | INTRAMUSCULAR | Status: DC
  Filled 2012-08-18: qty 4

## 2012-08-18 MED ORDER — LISINOPRIL 20 MG PO TABS
20.0000 mg | ORAL_TABLET | Freq: Every day | ORAL | Status: DC
  Administered 2012-08-18 – 2012-08-19 (×2): 20 mg via ORAL
  Filled 2012-08-18 (×2): qty 1

## 2012-08-18 MED ORDER — FUROSEMIDE 10 MG/ML IJ SOLN: 20.0000 mg | Freq: Once | INTRAMUSCULAR | Status: DC

## 2012-08-18 NOTE — Progress Notes (Addendum)
[facility] AAA Progress Note  08/18/2012 7:42 AM POD 9  Subjective:  "I feel better today" -states his right foot is about the same as yesterday or maybe a little bit better.  States he hasn't had but only one BM since surgery  Afebrile VSS 99% RA  Filed Vitals:   08/18/12 0511  BP: 147/70  Pulse: 72  Temp: 98.2 F (36.8 C)  Resp: 18    Physical Exam: Cardiac:  RRR Lungs:  CTAB Abdomen:  Soft NT/ND +BS Incisions:  C/d/i on abdomen and bilateral groins with staples in tact Extremities:  Right foot stable from yesterday.  CBC    Component Value Date/Time   WBC 10.0 08/15/2012 0455   RBC 3.08* 08/15/2012 0455   HGB 9.2* 08/15/2012 0455   HCT 28.7* 08/15/2012 0455   PLT 232 08/15/2012 0455   MCV 93.2 08/15/2012 0455   MCH 29.9 08/15/2012 0455   MCHC 32.1 08/15/2012 0455   RDW 14.7 08/15/2012 0455   LYMPHSABS 2.1 02/03/2011 1155   MONOABS 0.7 02/03/2011 1155   EOSABS 0.3 02/03/2011 1155   BASOSABS 0.1 02/03/2011 1155    BMET    Component Value Date/Time   NA 139 08/18/2012 0507   K 3.8 08/18/2012 0507   CL 105 08/18/2012 0507   CO2 29 08/18/2012 0507   GLUCOSE 136* 08/18/2012 0507   BUN 27* 08/18/2012 0507   CREATININE 1.57* 08/18/2012 0507   CALCIUM 8.7 08/18/2012 0507   GFRNONAA 41* 08/18/2012 0507   GFRAA 47* 08/18/2012 0507    INR    Component Value Date/Time   INR 1.15 08/09/2012 1603     Intake/Output Summary (Last 24 hours) at 08/18/12 0742 Last data filed at 08/18/12 0500  Gross per 24 hour  Intake    480 ml  Output    600 ml  Net   -120 ml     Assessment/Plan:  79 y.o. male is s/p Aortobifemoral bypass with 18 x 9 Hemashield graft with extensive right femoral endarterectomy, resection of abdominal aortic aneurysm  POD 9  -pt refused lasix last evening-since BP in 140's systolic, will cancel this evening lasix and give another 20 mg IV this morning (weight down 4 lbs from yesterday)-will continue Kdur 10 mEq bid given elevated  BUN/Cr -BUN improved/Cr stable -check labs in am -states he has only had one BM since surgery-will give dulcolax this am -continue to mobilize and OOB -hopefully to CIR soon   Doreatha Massed, PA-C [facility] [phone] 08/18/2012 7:42 AM  Continues to make progress Decreased lower extremity edema today following diuresis Tolerating diet adequately CIR soon

## 2012-08-18 NOTE — Progress Notes (Signed)
   Subjective:  Denies CP or dyspnea; edema improving   Objective:  Filed Vitals:   08/17/12 1400 08/17/12 1944 08/17/12 2005 08/18/12 0511  BP: 120/66 109/47  147/70  Pulse: 58 63  72  Temp: 97.9 F (36.6 C) 97.8 F (36.6 C)  98.2 F (36.8 C)  TempSrc: Oral Oral  Oral  Resp: 18 18  18   Height:      Weight:    214 lb 12.8 oz (97.433 kg)  SpO2: 98% 97% 98% 99%    Intake/Output from previous day:  Intake/Output Summary (Last 24 hours) at 08/18/12 0753 Last data filed at 08/18/12 0500  Gross per 24 hour  Intake    480 ml  Output    600 ml  Net   -120 ml    Physical Exam: Physical exam: Well-developed well-nourished in no acute distress.  Skin is warm and dry.  HEENT is normal.  Neck is supple.  Chest is clear to auscultation with normal expansion.  Cardiovascular exam is regular rate and rhythm.  Abdominal exam s/p abdominal surgery Extremities show 1+ edema. neuro grossly intact    Lab Results: Basic Metabolic Panel:  Recent Labs  40/98/11 0507  NA 139  K 3.8  CL 105  CO2 29  GLUCOSE 136*  BUN 27*  CREATININE 1.57*  CALCIUM 8.7    Assessment/Plan:  1) Bradycardia - patient continues to have bradycardia with intermittent Mobitz 1. However this is predominantly with sleep during early a.m. hours. No indication for pacemaker. Will DC beta blocker. 2) status post aortobifemoral bypass graft - management per vascular surgery. 3) postop volume excess-continue diuresis and follow renal function. He remains volume overloaded but improving. 4) coronary artery disease status post coronary artery bypass graft-continue aspirin and statin. 5) HTN - given DC beta blocker, will increase lisinopril to 20 mg po daily.  Olga Millers 08/18/2012, 7:53 AM

## 2012-08-18 NOTE — Progress Notes (Signed)
Pt has an expired peripheral IV. Attempted to get a new site, unfortunately it went unsuccessful. Pt refused to let anybody to try once again. Educated the patient about the need of new IV line, but pt still not ready for another attempt. Will try later.  [name] [name]

## 2012-08-18 NOTE — Progress Notes (Signed)
[facility]  CONSULT SERVICE NOTE  Pt diuresing. Looks like he will be ready to come to rehab tomorrow. Will follow up on medical status in the AM.  Thanks.            Ranelle Oyster, MD, [facility] Rehabilitation

## 2012-08-19 ENCOUNTER — Inpatient Hospital Stay (HOSPITAL_COMMUNITY): Payer: Medicare Other | Admitting: Occupational Therapy

## 2012-08-19 ENCOUNTER — Inpatient Hospital Stay (HOSPITAL_COMMUNITY)
Admission: AD | Admit: 2012-08-19 | Discharge: 2012-08-23 | DRG: 945 | Disposition: A | Discharge status: Home Health | Payer: Medicare Other | Source: Intra-hospital | Attending: Physical Medicine & Rehabilitation | Admitting: Physical Medicine & Rehabilitation

## 2012-08-19 ENCOUNTER — Encounter (HOSPITAL_COMMUNITY): Payer: Self-pay | Admitting: Physical Medicine and Rehabilitation

## 2012-08-19 ENCOUNTER — Inpatient Hospital Stay (HOSPITAL_COMMUNITY): Payer: Medicare Other

## 2012-08-19 ENCOUNTER — Inpatient Hospital Stay (HOSPITAL_COMMUNITY): Payer: Medicare Other | Admitting: Physical Therapy

## 2012-08-19 DIAGNOSIS — I2510 Atherosclerotic heart disease of native coronary artery without angina pectoris: Secondary | ICD-10-CM | POA: Diagnosis present

## 2012-08-19 DIAGNOSIS — E785 Hyperlipidemia, unspecified: Secondary | ICD-10-CM | POA: Diagnosis present

## 2012-08-19 DIAGNOSIS — R5381 Other malaise: Secondary | ICD-10-CM

## 2012-08-19 DIAGNOSIS — I10 Essential (primary) hypertension: Secondary | ICD-10-CM | POA: Diagnosis present

## 2012-08-19 DIAGNOSIS — M216X9 Other acquired deformities of unspecified foot: Secondary | ICD-10-CM | POA: Diagnosis present

## 2012-08-19 DIAGNOSIS — K5900 Constipation, unspecified: Secondary | ICD-10-CM | POA: Diagnosis not present

## 2012-08-19 DIAGNOSIS — F17200 Nicotine dependence, unspecified, uncomplicated: Secondary | ICD-10-CM | POA: Diagnosis present

## 2012-08-19 DIAGNOSIS — I252 Old myocardial infarction: Secondary | ICD-10-CM | POA: Diagnosis not present

## 2012-08-19 DIAGNOSIS — J449 Chronic obstructive pulmonary disease, unspecified: Secondary | ICD-10-CM | POA: Diagnosis present

## 2012-08-19 DIAGNOSIS — I714 Abdominal aortic aneurysm, without rupture, unspecified: Secondary | ICD-10-CM | POA: Diagnosis present

## 2012-08-19 DIAGNOSIS — I498 Other specified cardiac arrhythmias: Secondary | ICD-10-CM | POA: Diagnosis not present

## 2012-08-19 DIAGNOSIS — N189 Chronic kidney disease, unspecified: Secondary | ICD-10-CM | POA: Diagnosis present

## 2012-08-19 DIAGNOSIS — I509 Heart failure, unspecified: Secondary | ICD-10-CM

## 2012-08-19 DIAGNOSIS — I129 Hypertensive chronic kidney disease with stage 1 through stage 4 chronic kidney disease, or unspecified chronic kidney disease: Secondary | ICD-10-CM | POA: Diagnosis present

## 2012-08-19 DIAGNOSIS — E8770 Fluid overload, unspecified: Secondary | ICD-10-CM

## 2012-08-19 DIAGNOSIS — G589 Mononeuropathy, unspecified: Secondary | ICD-10-CM | POA: Diagnosis present

## 2012-08-19 DIAGNOSIS — J441 Chronic obstructive pulmonary disease with (acute) exacerbation: Secondary | ICD-10-CM | POA: Diagnosis present

## 2012-08-19 DIAGNOSIS — K560 Paralytic ileus: Secondary | ICD-10-CM | POA: Diagnosis present

## 2012-08-19 DIAGNOSIS — M79609 Pain in unspecified limb: Secondary | ICD-10-CM | POA: Diagnosis present

## 2012-08-19 DIAGNOSIS — J4489 Other specified chronic obstructive pulmonary disease: Secondary | ICD-10-CM | POA: Diagnosis present

## 2012-08-19 DIAGNOSIS — R29898 Other symptoms and signs involving the musculoskeletal system: Secondary | ICD-10-CM | POA: Diagnosis present

## 2012-08-19 DIAGNOSIS — I639 Cerebral infarction, unspecified: Secondary | ICD-10-CM | POA: Diagnosis present

## 2012-08-19 DIAGNOSIS — I441 Atrioventricular block, second degree: Secondary | ICD-10-CM | POA: Diagnosis not present

## 2012-08-19 DIAGNOSIS — I69998 Other sequelae following unspecified cerebrovascular disease: Secondary | ICD-10-CM | POA: Diagnosis not present

## 2012-08-19 DIAGNOSIS — I739 Peripheral vascular disease, unspecified: Secondary | ICD-10-CM | POA: Diagnosis present

## 2012-08-19 DIAGNOSIS — Z5189 Encounter for other specified aftercare: Secondary | ICD-10-CM | POA: Diagnosis not present

## 2012-08-19 LAB — CBC WITH DIFFERENTIAL/PLATELET
Basophils Absolute: 0.1 10*3/uL (ref 0.0–0.1)
Eosinophils Relative: 4 % (ref 0–5)
HCT: 31.4 % — ABNORMAL LOW (ref 39.0–52.0)
Lymphocytes Relative: 21 % (ref 12–46)
Lymphs Abs: 2.3 10*3/uL (ref 0.7–4.0)
MCV: 95.2 fL (ref 78.0–100.0)
Monocytes Absolute: 0.9 10*3/uL (ref 0.1–1.0)
Neutro Abs: 7.1 10*3/uL (ref 1.7–7.7)
RBC: 3.3 MIL/uL — ABNORMAL LOW (ref 4.22–5.81)
RDW: 15 % (ref 11.5–15.5)
WBC: 10.8 10*3/uL — ABNORMAL HIGH (ref 4.0–10.5)

## 2012-08-19 LAB — COMPREHENSIVE METABOLIC PANEL
ALT: 21 U/L (ref 0–53)
AST: 31 U/L (ref 0–37)
CO2: 29 mEq/L (ref 19–32)
Calcium: 9 mg/dL (ref 8.4–10.5)
Chloride: 102 mEq/L (ref 96–112)
Creatinine, Ser: 1.63 mg/dL — ABNORMAL HIGH (ref 0.50–1.35)
GFR calc Af Amer: 45 mL/min — ABNORMAL LOW (ref >90)
GFR calc non Af Amer: 39 mL/min — ABNORMAL LOW (ref >90)
Glucose, Bld: 180 mg/dL — ABNORMAL HIGH (ref 70–99)
Sodium: 139 mEq/L (ref 135–145)
Total Bilirubin: 0.4 mg/dL (ref 0.3–1.2)

## 2012-08-19 LAB — BASIC METABOLIC PANEL
Calcium: 8.8 mg/dL (ref 8.4–10.5)
Creatinine, Ser: 1.52 mg/dL — ABNORMAL HIGH (ref 0.50–1.35)
GFR calc Af Amer: 49 mL/min — ABNORMAL LOW (ref >90)
Sodium: 139 mEq/L (ref 135–145)

## 2012-08-19 LAB — CBC
MCH: 30.1 pg (ref 26.0–34.0)
MCV: 93.4 fL (ref 78.0–100.0)
Platelets: 334 10*3/uL (ref 150–400)
RDW: 14.7 % (ref 11.5–15.5)
WBC: 10.6 10*3/uL — ABNORMAL HIGH (ref 4.0–10.5)

## 2012-08-19 MED ORDER — ALUM & MAG HYDROXIDE-SIMETH 200-200-20 MG/5ML PO SUSP: 30.0000 mL | ORAL | Status: DC | PRN

## 2012-08-19 MED ORDER — LISINOPRIL 20 MG PO TABS
20.0000 mg | ORAL_TABLET | Freq: Every day | ORAL | Status: DC
  Administered 2012-08-20 – 2012-08-23 (×4): 20 mg via ORAL
  Filled 2012-08-19 (×5): qty 1

## 2012-08-19 MED ORDER — PANTOPRAZOLE SODIUM 40 MG PO TBEC
40.0000 mg | DELAYED_RELEASE_TABLET | Freq: Every day | ORAL | Status: DC
  Administered 2012-08-20 – 2012-08-21 (×2): 40 mg via ORAL
  Filled 2012-08-19 (×5): qty 1

## 2012-08-19 MED ORDER — ALPRAZOLAM 0.25 MG PO TABS: 0.2500 mg | ORAL_TABLET | Freq: Three times a day (TID) | ORAL | Status: DC | PRN

## 2012-08-19 MED ORDER — FUROSEMIDE 20 MG PO TABS: 20.0000 mg | ORAL_TABLET | Freq: Every day | ORAL | Status: DC

## 2012-08-19 MED ORDER — ENOXAPARIN SODIUM 40 MG/0.4ML ~~LOC~~ SOLN
40.0000 mg | SUBCUTANEOUS | Status: DC
  Administered 2012-08-20 – 2012-08-23 (×4): 40 mg via SUBCUTANEOUS
  Filled 2012-08-19 (×5): qty 0.4

## 2012-08-19 MED ORDER — ZOLPIDEM TARTRATE 5 MG PO TABS
5.0000 mg | ORAL_TABLET | Freq: Every day | ORAL | Status: DC
  Administered 2012-08-19 – 2012-08-22 (×4): 5 mg via ORAL
  Filled 2012-08-19 (×4): qty 1

## 2012-08-19 MED ORDER — PROCHLORPERAZINE EDISYLATE 5 MG/ML IJ SOLN
5.0000 mg | Freq: Four times a day (QID) | INTRAMUSCULAR | Status: DC | PRN
  Filled 2012-08-19: qty 2

## 2012-08-19 MED ORDER — MOMETASONE FURO-FORMOTEROL FUM 100-5 MCG/ACT IN AERO
2.0000 | INHALATION_SPRAY | Freq: Two times a day (BID) | RESPIRATORY_TRACT | Status: DC
  Administered 2012-08-20 – 2012-08-23 (×6): 2 via RESPIRATORY_TRACT
  Filled 2012-08-19: qty 8.8

## 2012-08-19 MED ORDER — ENOXAPARIN SODIUM 30 MG/0.3ML ~~LOC~~ SOLN: 30.0000 mg | SUBCUTANEOUS | Status: DC

## 2012-08-19 MED ORDER — GUAIFENESIN-DM 100-10 MG/5ML PO SYRP: 5.0000 mL | ORAL_SOLUTION | Freq: Four times a day (QID) | ORAL | Status: DC | PRN

## 2012-08-19 MED ORDER — LACTULOSE 10 GM/15ML PO SOLN
30.0000 g | Freq: Once | ORAL | Status: DC
  Filled 2012-08-19: qty 45

## 2012-08-19 MED ORDER — PROCHLORPERAZINE 25 MG RE SUPP
12.5000 mg | Freq: Four times a day (QID) | RECTAL | Status: DC | PRN
  Filled 2012-08-19: qty 1

## 2012-08-19 MED ORDER — ALBUTEROL SULFATE (5 MG/ML) 0.5% IN NEBU: 2.5000 mg | INHALATION_SOLUTION | RESPIRATORY_TRACT | Status: DC | PRN

## 2012-08-19 MED ORDER — BISACODYL 10 MG RE SUPP: 10.0000 mg | Freq: Every day | RECTAL | Status: DC | PRN

## 2012-08-19 MED ORDER — FESOTERODINE FUMARATE ER 4 MG PO TB24
4.0000 mg | ORAL_TABLET | Freq: Every day | ORAL | Status: DC
  Administered 2012-08-20 – 2012-08-23 (×4): 4 mg via ORAL
  Filled 2012-08-19 (×6): qty 1

## 2012-08-19 MED ORDER — FLEET ENEMA 7-19 GM/118ML RE ENEM
1.0000 | ENEMA | Freq: Once | RECTAL | Status: DC
  Filled 2012-08-19: qty 1

## 2012-08-19 MED ORDER — FUROSEMIDE 20 MG PO TABS
20.0000 mg | ORAL_TABLET | Freq: Every day | ORAL | Status: DC
  Administered 2012-08-20 – 2012-08-23 (×4): 20 mg via ORAL
  Filled 2012-08-19 (×5): qty 1

## 2012-08-19 MED ORDER — TRAMADOL HCL 50 MG PO TABS: 50.0000 mg | ORAL_TABLET | Freq: Four times a day (QID) | ORAL | Status: DC | PRN

## 2012-08-19 MED ORDER — FUROSEMIDE 20 MG PO TABS
20.0000 mg | ORAL_TABLET | Freq: Every day | ORAL | Status: DC
  Administered 2012-08-19: 20 mg via ORAL
  Filled 2012-08-19 (×2): qty 1

## 2012-08-19 MED ORDER — PHENOL 1.4 % MT LIQD
1.0000 | OROMUCOSAL | Status: DC | PRN
  Filled 2012-08-19: qty 177

## 2012-08-19 MED ORDER — OXYCODONE HCL 5 MG PO TABS
5.0000 mg | ORAL_TABLET | ORAL | Status: DC | PRN
  Administered 2012-08-20 – 2012-08-23 (×8): 10 mg via ORAL
  Filled 2012-08-19 (×4): qty 2
  Filled 2012-08-19: qty 1
  Filled 2012-08-19 (×4): qty 2

## 2012-08-19 MED ORDER — LISINOPRIL 20 MG PO TABS: 20.0000 mg | ORAL_TABLET | Freq: Every day | ORAL | Status: DC

## 2012-08-19 MED ORDER — FLEET ENEMA 7-19 GM/118ML RE ENEM
1.0000 | ENEMA | Freq: Once | RECTAL | Status: AC | PRN
  Filled 2012-08-19: qty 1

## 2012-08-19 MED ORDER — ATORVASTATIN CALCIUM 40 MG PO TABS
40.0000 mg | ORAL_TABLET | Freq: Every day | ORAL | Status: DC
  Administered 2012-08-19 – 2012-08-22 (×4): 40 mg via ORAL
  Filled 2012-08-19 (×5): qty 1

## 2012-08-19 MED ORDER — DIPHENHYDRAMINE HCL 12.5 MG/5ML PO ELIX
12.5000 mg | ORAL_SOLUTION | Freq: Four times a day (QID) | ORAL | Status: DC | PRN
  Filled 2012-08-19: qty 10

## 2012-08-19 MED ORDER — INSULIN ASPART 100 UNIT/ML ~~LOC~~ SOLN
0.0000 [IU] | Freq: Three times a day (TID) | SUBCUTANEOUS | Status: DC
  Administered 2012-08-20 (×2): 2 [IU] via SUBCUTANEOUS
  Administered 2012-08-21: 3 [IU] via SUBCUTANEOUS
  Administered 2012-08-21 – 2012-08-23 (×3): 2 [IU] via SUBCUTANEOUS

## 2012-08-19 MED ORDER — SENNOSIDES-DOCUSATE SODIUM 8.6-50 MG PO TABS
2.0000 | ORAL_TABLET | Freq: Two times a day (BID) | ORAL | Status: DC
  Administered 2012-08-20 – 2012-08-22 (×3): 2 via ORAL
  Filled 2012-08-19 (×10): qty 2

## 2012-08-19 MED ORDER — ASPIRIN EC 81 MG PO TBEC
162.0000 mg | DELAYED_RELEASE_TABLET | Freq: Every day | ORAL | Status: DC
  Administered 2012-08-20 – 2012-08-23 (×4): 162 mg via ORAL
  Filled 2012-08-19 (×5): qty 2

## 2012-08-19 MED ORDER — HYDROCODONE-ACETAMINOPHEN 5-325 MG PO TABS
1.0000 | ORAL_TABLET | Freq: Four times a day (QID) | ORAL | Status: DC | PRN
  Administered 2012-08-19 – 2012-08-20 (×2): 2 via ORAL
  Filled 2012-08-19 (×2): qty 2

## 2012-08-19 MED ORDER — ACETAMINOPHEN 325 MG PO TABS: 325.0000 mg | ORAL_TABLET | ORAL | Status: DC | PRN

## 2012-08-19 MED ORDER — PROCHLORPERAZINE MALEATE 5 MG PO TABS
5.0000 mg | ORAL_TABLET | Freq: Four times a day (QID) | ORAL | Status: DC | PRN
  Filled 2012-08-19: qty 2

## 2012-08-19 NOTE — Progress Notes (Signed)
Bed available CIR.  Can admit pt to CIR today if ready medically.  [phone]

## 2012-08-19 NOTE — Plan of Care (Signed)
Overall Plan of Care [facility]) Patient Details Name: [name] [name] MRN: [id number] DOB: Dec 16, 1931  Diagnosis:  Deconditioning after AAA repair and right femoral endarterectomy  Co-morbidities: HTN (hypertension)  .  Dyslipidemia  .  CVA (cerebral vascular accident)  1997, affected right side  .  AAA (abdominal aortic aneurysm)  Also aneurysms of the infrarenal aorta and iliac arteries.. followed Dr. Edilia Bo  .  Renal artery stenosis  Noted, 2005, medical therapy  .  Tobacco abuse  .  CAD (coronary artery disease)  Nuclear, August 28, 2011, EF 60%, small fixed defect at the apex consistent with apical thinning and moderate fixed inferobasal defect compatible with prior infarct, no ischemia  .  Hx of CABG  2005  .  Ejection fraction  2012  EF 65-70%  .  Neuropathy  Right foot  .  Myocardial infarction  2005  .  Leg pain  .  Abdominal pain  .  Hyperlipidemia  .  Nervousness(799.21)  .  Peripheral vascular disease  .  Carotid artery occlusion  .  COPD (chronic obstructive pulmonary disease)    Functional Problem List  Patient demonstrates impairments in the following areas: Balance, Bowel, Edema, Endurance, Medication Management, Pain, Safety and Skin Integrity  Basic ADL's: grooming, bathing, dressing and toileting Advanced ADL's: simple beverage and snack preparation, laundry and light housekeeping  Transfers:  bed mobility, bed to chair, toilet, tub/shower, car and furniture Locomotion:  ambulation, wheelchair mobility and stairs  Additional Impairments:  None  Anticipated Outcomes Item Anticipated Outcome  Eating/Swallowing    Basic self-care  Mod I  Tolieting  Mod I  Bowel/Bladder  Will have normal bowel movements daily post surgery  Transfers  Mod I except Supervision with Tub/Shower transfer  Locomotion  Supervision with RW  Communication    Cognition    Pain  Will have pain less than 3/10   Safety/Judgment  Will follow safety plan;  ask for assistance   Other     Therapy Plan: PT Intensity: Minimum of 1-2 x/day ,45 to 90 minutes PT Frequency: 5 out of 7 days PT Duration Estimated Length of Stay: 5 days OT Intensity: Minimum of 1-2 x/day, 45 to 90 minutes OT Frequency: 5 out of 7 days OT Duration/Estimated Length of Stay: 4-5 days      Team Interventions: Item RN PT OT SLP SW TR Other  Self Care/Advanced ADL Retraining   x      Neuromuscular Re-Education  x       Therapeutic Activities  x x      UE/LE Strength Training/ROM  x x      UE/LE Coordination Activities  x x      Visual/Perceptual Remediation/Compensation         DME/Adaptive Equipment Instruction  x x      Therapeutic Exercise  x x      Balance/Vestibular Training  x x      Patient/Family Education x x x      Cognitive Remediation/Compensation         Functional Mobility Training  x x      Ambulation/Gait Training  x       Museum/gallery curator  x       Wheelchair Propulsion/Positioning  x       Functional Tourist information centre manager Reintegration  x x      Dysphagia/Aspiration Film/video editor  Bladder Management x        Bowel Management x        Disease Management/Prevention x x x      Pain Management x x x      Medication Management         Skin Care/Wound Management         Splinting/Orthotics         Discharge Planning  x x      Psychosocial Support x x x                             Team Discharge Planning: Destination: PT-Home ,OT- Home , SLP-  Projected Follow-up: PT-Home health PT;24 hour supervision/assistance, OT- No OT follow-up anticipated, SLP-  Projected Equipment Needs: PT-Rolling walker with 5" wheels;Wheelchair (measurements);Wheelchair cushion (measurements), OT- Tub/shower bench, SLP-  Patient/family involved in discharge planning: PT- Patient,  OT-Patient, SLP-   MD ELOS: 7-10 days Medical Rehab Prognosis:  Good Assessment: 79 year old male with  peripheral vascular disease admitted for repair of aortic aneurysm as well as a right femoral endarterectomy. Has chronic right hemiparesis from previous CVA. Now requiring 24/7 Rehab RN,MD, as well as CIR level PT, OT and SLP.  Treatment team will focus on ADLs and mobility with goals set at modified independent to supervision level.   See Team Conference Notes for weekly updates to the plan of care

## 2012-08-19 NOTE — Progress Notes (Signed)
Report called to Alvino Chapel, RN in CIR. Pt taken to CIR in W/C with belongings. IV and tele removed.

## 2012-08-19 NOTE — Progress Notes (Addendum)
[facility] AAA Progress Note  08/19/2012 7:33 AM POD 10  Subjective:  "I would feel better if I had a bowel movement"  Overall, he states he feels better.  Afebrile VSS   Filed Vitals:   08/19/12 0528  BP: 123/58  Pulse: 63  Temp: 98.1 F (36.7 C)  Resp: 16    Physical Exam: Cardiac:  RRR Lungs:  CTAB Abdomen:  Soft NT/ND +BS -BM (one time since surgery) Incisions:  C/d/i on abdomen and bilateral groins with staples in tact Extremities:  Right foot stable with erythema at toes; right foot is warm.  Left foot warm.  Still with mild edema in BLE, but improved from past couple of days.  CBC    Component Value Date/Time   WBC 10.6* 08/19/2012 0455   RBC 3.16* 08/19/2012 0455   HGB 9.5* 08/19/2012 0455   HCT 29.5* 08/19/2012 0455   PLT 334 08/19/2012 0455   MCV 93.4 08/19/2012 0455   MCH 30.1 08/19/2012 0455   MCHC 32.2 08/19/2012 0455   RDW 14.7 08/19/2012 0455   LYMPHSABS 2.1 02/03/2011 1155   MONOABS 0.7 02/03/2011 1155   EOSABS 0.3 02/03/2011 1155   BASOSABS 0.1 02/03/2011 1155    BMET    Component Value Date/Time   NA 139 08/19/2012 0455   K 4.1 08/19/2012 0455   CL 104 08/19/2012 0455   CO2 29 08/19/2012 0455   GLUCOSE 111* 08/19/2012 0455   BUN 25* 08/19/2012 0455   CREATININE 1.52* 08/19/2012 0455   CALCIUM 8.8 08/19/2012 0455   GFRNONAA 42* 08/19/2012 0455   GFRAA 49* 08/19/2012 0455    INR    Component Value Date/Time   INR 1.15 08/09/2012 1603     Intake/Output Summary (Last 24 hours) at 08/19/12 0733 Last data filed at 08/19/12 0610  Gross per 24 hour  Intake    360 ml  Output   1605 ml  Net  -1245 ml     Assessment/Plan:  79 y.o. male is s/p Aortobifemoral bypass with 18 x 9 Hemashield graft with extensive right femoral endarterectomy, resection of abdominal aortic aneurysm   POD 10  -pt continues to do well, but needs to have a BM-given renal function, will give lactulose today -doing well with diuresis-down 5lbs in the past  couple of days (7lbs since 6/13); K+ is 4.1 and BUN/Cr is stable (slightly improved) -continue to mobilize -should discharge to CIR today -did not restart actos as pt's glucose is controlled with minimal insulin   Doreatha Massed, PA-C [facility] [phone] 08/19/2012 7:33 AM  I have examined the patient, reviewed and agree with above.Stable overall.  No sign of CHF.  OK for CIR  EARLY, TODD, MD 08/19/2012 10:07 AM

## 2012-08-19 NOTE — Progress Notes (Signed)
At 1610, patient admitted to rehab 4100. Safety plan initiated. Oriented to room. Plan of care explained. Sherlyn Lees, RN

## 2012-08-19 NOTE — Progress Notes (Signed)
   Subjective:  Denies CP or dyspnea; edema improving   Objective:  Filed Vitals:   08/18/12 0511 08/18/12 1358 08/18/12 2135 08/19/12 0528  BP: 147/70 119/54 124/61 123/58  Pulse: 72 70 65 63  Temp: 98.2 F (36.8 C) 98.2 F (36.8 C) 97.7 F (36.5 C) 98.1 F (36.7 C)  TempSrc: Oral Oral Oral Oral  Resp: 18 18 16 16   Height:      Weight: 214 lb 12.8 oz (97.433 kg)   213 lb 13.5 oz (97 kg)  SpO2: 99% 95% 95% 96%    Intake/Output from previous day:  Intake/Output Summary (Last 24 hours) at 08/19/12 0900 Last data filed at 08/19/12 0859  Gross per 24 hour  Intake    100 ml  Output   1675 ml  Net  -1575 ml    Physical Exam: Physical exam: Well-developed well-nourished in no acute distress.  Skin is warm and dry.  HEENT is normal.  Neck is supple.  Chest is clear to auscultation with normal expansion.  Cardiovascular exam is regular rate and rhythm.  Abdominal exam s/p abdominal surgery Extremities show trace edema. neuro grossly intact    Lab Results: Basic Metabolic Panel:  Recent Labs  47/82/95 0507 08/19/12 0455  NA 139 139  K 3.8 4.1  CL 105 104  CO2 29 29  GLUCOSE 136* 111*  BUN 27* 25*  CREATININE 1.57* 1.52*  CALCIUM 8.7 8.8    Assessment/Plan:  1) Bradycardia - patient continues to have bradycardia with intermittent Mobitz 1. However this is predominantly with sleep during early a.m. hours. No indication for pacemaker. Continue off beta blocker. 2) status post aortobifemoral bypass graft - management per vascular surgery. 3) postop volume excess-continue diuresis but change to lasix 20 mg po daily; DC KCL. 4) coronary artery disease status post coronary artery bypass graft-continue aspirin and statin. 5) HTN - continue lisinopril 20 mg po daily.  Olga Millers 08/19/2012, 9:00 AM

## 2012-08-19 NOTE — H&P (Signed)
Physical Medicine and Rehabilitation Admission H&P  CC: PAD with claudication RLE, AAA  HPI: GARV KUECHLE is a 79 y.o. male with history of HTN, CVA, AAA, tobacco abuse, PAD with severe right foot pain with limb threatening ischemia. He was admitted on 08/09/12 for AA with extensive right femoral endarterectomy and resection of AAA by Dr. Arbie Cookey. Post op with abdominal pain due to ileus. NGT discontinued and diet initiated yesterday. He has had complaints of dyspnea with wheezing and was treated with dose of lasix. CXR without acute findings.  Lovenox discontinued due to downward trend of H/H. Had had edema RLE therefore doppler done today and were negative for DVT. Doppler signals improving on left and right toes remain ruborous and painful. He developed bradycardia and coreg decreased per cardiology input. Therapies initiated and patient noted to be deconditioned. Team recommended CIR and patient admitted today.    Review of Systems  Constitutional: Negative for weight loss and malaise/fatigue.  HENT: Negative for hearing loss.  Eyes: Negative for blurred vision and double vision.  Respiratory: Positive for hemoptysis. Negative for cough and shortness of breath.  Cardiovascular: Negative for chest pain and palpitations.  Musculoskeletal: Positive for myalgias (RLE). Negative for back pain and joint pain.  Neurological: Positive for focal weakness (RLE weakness and right foot drop since CVA. ). Negative for headaches.   Past Medical History   Diagnosis  Date   .  HTN (hypertension)    .  Dyslipidemia    .  CVA (cerebral vascular accident)      1997, affected right side   .  AAA (abdominal aortic aneurysm)      Also aneurysms of the infrarenal aorta and iliac arteries.. followed Dr. Edilia Bo   .  Renal artery stenosis      Noted, 2005, medical therapy   .  Tobacco abuse    .  CAD (coronary artery disease)      Nuclear, August 28, 2011, EF 60%, small fixed defect at the apex consistent with  apical thinning and moderate fixed inferobasal defect compatible with prior infarct, no ischemia   .  Hx of CABG      2005   .  Ejection fraction  2012     EF 65-70%   .  Neuropathy      Right foot   .  Myocardial infarction  2005   .  Leg pain    .  Abdominal pain    .  Hyperlipidemia    .  Nervousness(799.21)    .  Peripheral vascular disease    .  Carotid artery occlusion    .  COPD (chronic obstructive pulmonary disease)  08/15/2012    Past Surgical History   Procedure  Laterality  Date   .  Coronary artery bypass graft   2005     x4   .  Spine surgery   1977     Lumbar HNP surgery   .  Aorta - bilateral femoral artery bypass graft  N/A  08/09/2012     Procedure: AORTA BIFEMORAL BYPASS GRAFT; Surgeon: Larina Earthly, MD; Location: [facility] OR; Service: Vascular; Laterality: N/A;    Family History   Problem  Relation  Age of Onset   .  Tuberculosis  Mother    .  Coronary artery disease  Father     Social History: Married. 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 He reports that he has been smoking Cigarettes--1 PPD. He  has a 25 pack-year smoking history. He does not have any smokeless tobacco history on file. He reports he does not drink alcohol. He reports that he does not use illicit drugs.   Allergies: No Known Allergies  Medications Prior to Admission   Medication  Sig  Dispense  Refill   .  aspirin EC 81 MG tablet  Take 162 mg by mouth daily.     Marland Kitchen  atorvastatin (LIPITOR) 40 MG tablet  Take 40 mg by mouth daily.     .  chlorthalidone (HYGROTON) 25 MG tablet  Take 25 mg by mouth every other day.     .  cholecalciferol (VITAMIN D) 1000 UNITS tablet  Take 1,000 Units by mouth daily.     Marland Kitchen  HYDROcodone-acetaminophen (NORCO/VICODIN) 5-325 MG per tablet  Take 1-2 tablets by mouth every 6 (six) hours as needed for pain. TAKE 1-2 TABS EVERY 6 HRS PRN/ PAIN     .  tolterodine (DETROL LA) 4 MG 24 hr capsule  Take 4 mg by mouth every other day.     .  traMADol  (ULTRAM) 50 MG tablet  Take 50 mg by mouth every 6 (six) hours as needed for pain. For pain     .  vitamin B-12 (CYANOCOBALAMIN) 100 MCG tablet  Take 50 mcg by mouth daily.     Marland Kitchen  zolpidem (AMBIEN) 10 MG tablet  Take 10 mg by mouth at bedtime.     .  [DISCONTINUED] carvedilol (COREG) 25 MG tablet  Take 25 mg by mouth daily.     .  [DISCONTINUED] HYDROcodone-acetaminophen (NORCO/VICODIN) 5-325 MG per tablet  TAKE 1-2 TABS EVERY 6 HRS PRN/ PAIN  20 tablet  0   .  [DISCONTINUED] lisinopril (PRINIVIL,ZESTRIL) 10 MG tablet  Take 1 tablet (10 mg total) by mouth daily.  30 tablet  1   .  [DISCONTINUED] lisinopril (PRINIVIL,ZESTRIL) 10 MG tablet  Take 10 mg by mouth daily.     .  [DISCONTINUED] pioglitazone (ACTOS) 15 MG tablet  Take 15 mg by mouth daily.      Home:  Home Living  Lives With: Spouse  Available Help at Discharge: Family;Available 24 hours/day (sons can help intermittently)  Type of Home: House  Home Access: Stairs to enter  [employer] of Steps: 7 (5 from the street, then 2 into house)  Entrance Stairs-Rails: Right;Left  Home Layout: One level  Bathroom Shower/Tub: Medical sales representative: Standard  Bathroom Accessibility: Yes  How Accessible: Accessible via walker  Home Adaptive Equipment: Walker - rolling  Functional History:  Prior Function  Able to Take Stairs?: Yes  Driving: Yes  Vocation: Other (comment)  Comments: retires Leisure centre manager Status:  Mobility:  Bed Mobility  Bed Mobility: Supine to Sit;Sitting - Scoot to Edge of Bed  Supine to Sit: 4: Min assist  Sitting - Scoot to [place] of Bed: 4: Min guard  Transfers  Transfers: Sit to Stand;Stand to Sit  Sit to Stand: 4: Min assist;From elevated surface;With upper extremity assist;From bed  Stand to Sit: 4: Min guard;With armrests;To chair/3-in-1;With upper extremity assist  Ambulation/Gait  Ambulation/Gait Assistance: 4: Min assist  Ambulation/Gait: Patient Percentage: 80%  Ambulation  Distance (Feet): 80 Feet  Assistive device: Rolling walker  Ambulation/Gait Assistance Details: Patient continues with instability especially with stops and turns. Cues for RW manangement/safety  Gait Pattern: Step-through pattern;Trunk flexed;Wide base of support  Gait velocity: decreased  General Gait Details: limited by fatigue  Stairs: No  Wheelchair Mobility  Wheelchair Mobility: No  ADL:  ADL  Eating/Feeding: (ice chips only)  Grooming: Supervision/safety  Where Assessed - Grooming: Supported standing  Upper Body Bathing: Minimal assistance  Where Assessed - Upper Body Bathing: Supported sitting  Lower Body Bathing: Maximal assistance  Where Assessed - Lower Body Bathing: Supported sit to stand  Upper Body Dressing: Supervision/safety;Set up  Where Assessed - Upper Body Dressing: Unsupported sitting  Lower Body Dressing: +1 Total assistance  Where Assessed - Lower Body Dressing: Supported sit to stand  Toilet Transfer: Corporate investment banker Method: Sit to Production manager: Other (comment) (stood and used urinal)  Equipment Used: Gait belt;Rolling walker  Transfers/Ambulation Related to ADLs: min A  ADL Comments: will address AE  Cognition:  Cognition  Overall Cognitive Status: Within Functional Limits for tasks assessed  Arousal/Alertness: Awake/alert  Orientation Level: Oriented X4  Cognition  Arousal/Alertness: Awake/alert  Behavior During Therapy: WFL for tasks assessed/performed  Overall Cognitive Status: Within Functional Limits for tasks assessed    Physical Exam:  Blood pressure 123/58, pulse 63, temperature 98.1 F (36.7 C), temperature source Oral, resp. rate 16, height 5\' 10"  (1.778 m), weight 97 kg (213 lb 13.5 oz), SpO2 96.00%.  Physical Exam  Nursing note and vitals reviewed.  Constitutional: He is oriented to person, place, and time. He appears well-developed and well-nourished.  HENT: oral mucosa pink and  moist. Head: Normocephalic and atraumatic.  Eyes: Pupils are equal, round, and reactive to light.  Neck: Normal range of motion.  Cardiovascular: Normal rate and regular rhythm. Both lower extremity limbs warm, pink. Pulmonary/Chest: Effort normal. No respiratory distress. He has wheezes.  Abdominal: Soft. Bowel sounds are normal. He exhibits no distension. There is no tenderness.  Musculoskeletal: He exhibits tenderness (right forefoot with minimal pressure. .  Neurological: He is alert and oriented to person, place, and time.  Midline incision clean and dry with staples in place. From chest to waist. motor strength is 4/5 in bilateral deltoid, biceps, triceps, grip, hip flexor, knee extensor, right ankle dorsiflexor and plantar flexor 3 minus, left ankle dorsiflexor and plantar flexor are 4+. Sensation grossly intact in all 4's. Hyperalgesia on right foot, toes are erythematous  Skin: intact as noted above Psych: mood appropriate. Calm   Results for orders placed during the hospital encounter of 08/09/12 (from the past 48 hour(s))   GLUCOSE, CAPILLARY Status: Abnormal    Collection Time    08/17/12 4:35 PM   Result  Value  Range    Glucose-Capillary  151 (*)  70 - 99 mg/dL   GLUCOSE, CAPILLARY Status: Abnormal    Collection Time    08/17/12 9:14 PM   Result  Value  Range    Glucose-Capillary  115 (*)  70 - 99 mg/dL   BASIC METABOLIC PANEL Status: Abnormal    Collection Time    08/18/12 5:07 AM   Result  Value  Range    Sodium  139  135 - 145 mEq/L    Potassium  3.8  3.5 - 5.1 mEq/L    Chloride  105  96 - 112 mEq/L    CO2  29  19 - 32 mEq/L    Glucose, Bld  136 (*)  70 - 99 mg/dL    BUN  27 (*)  6 - 23 mg/dL    Creatinine, Ser  1.19 (*)  0.50 - 1.35 mg/dL    Calcium  8.7  8.4 - 10.5 mg/dL  GFR calc non Af Amer  41 (*)  >90 mL/min    GFR calc Af Amer  47 (*)  >90 mL/min    Comment:      The eGFR has been calculated     using the CKD EPI equation.     This calculation has  not been     validated in all clinical     situations.     eGFR's persistently     <90 mL/min signify     possible Chronic Kidney Disease.   GLUCOSE, CAPILLARY Status: Abnormal    Collection Time    08/18/12 6:08 AM   Result  Value  Range    Glucose-Capillary  119 (*)  70 - 99 mg/dL   GLUCOSE, CAPILLARY Status: Abnormal    Collection Time    08/18/12 11:16 AM   Result  Value  Range    Glucose-Capillary  117 (*)  70 - 99 mg/dL   GLUCOSE, CAPILLARY Status: Abnormal    Collection Time    08/18/12 3:43 PM   Result  Value  Range    Glucose-Capillary  134 (*)  70 - 99 mg/dL   GLUCOSE, CAPILLARY Status: Abnormal    Collection Time    08/18/12 9:34 PM   Result  Value  Range    Glucose-Capillary  144 (*)  70 - 99 mg/dL   CBC Status: Abnormal    Collection Time    08/19/12 4:55 AM   Result  Value  Range    WBC  10.6 (*)  4.0 - 10.5 K/uL    RBC  3.16 (*)  4.22 - 5.81 MIL/uL    Hemoglobin  9.5 (*)  13.0 - 17.0 g/dL    HCT  45.4 (*)  09.8 - 52.0 %    MCV  93.4  78.0 - 100.0 fL    MCH  30.1  26.0 - 34.0 pg    MCHC  32.2  30.0 - 36.0 g/dL    RDW  11.9  14.7 - 82.9 %    Platelets  334  150 - 400 K/uL   BASIC METABOLIC PANEL Status: Abnormal    Collection Time    08/19/12 4:55 AM   Result  Value  Range    Sodium  139  135 - 145 mEq/L    Potassium  4.1  3.5 - 5.1 mEq/L    Chloride  104  96 - 112 mEq/L    CO2  29  19 - 32 mEq/L    Glucose, Bld  111 (*)  70 - 99 mg/dL    BUN  25 (*)  6 - 23 mg/dL    Creatinine, Ser  5.62 (*)  0.50 - 1.35 mg/dL    Calcium  8.8  8.4 - 10.5 mg/dL    GFR calc non Af Amer  42 (*)  >90 mL/min    GFR calc Af Amer  49 (*)  >90 mL/min    Comment:      The eGFR has been calculated     using the CKD EPI equation.     This calculation has not been     validated in all clinical     situations.     eGFR's persistently     <90 mL/min signify     possible Chronic Kidney Disease.   GLUCOSE, CAPILLARY Status: Abnormal    Collection Time    08/19/12 6:09 AM    Result  Value  Range  Glucose-Capillary  117 (*)  70 - 99 mg/dL   GLUCOSE, CAPILLARY Status: Abnormal    Collection Time    08/19/12 11:15 AM   Result  Value  Range    Glucose-Capillary  133 (*)  70 - 99 mg/dL    No results found.  Post Admission Physician Evaluation:  1. Functional deficits secondary to gait disorder due femoral artery embolus,AAA s/p right femoral endarterectomy, AAA repair. Pt further deconditioned after multiple medical complications. 2. Patient is admitted to receive collaborative, interdisciplinary care between the physiatrist, rehab nursing staff, and therapy team. 3. Patient's level of medical complexity and substantial therapy needs in context of that medical necessity cannot be provided at a lesser intensity of care such as a SNF. 4. Patient has experienced substantial functional loss from his/her baseline which was documented above under the "Functional History" and "Functional Status" headings. Judging by the patient's diagnosis, physical exam, and functional history, the patient has potential for functional progress which will result in measurable gains while on inpatient rehab. These gains will be of substantial and practical use upon discharge in facilitating mobility and self-care at the household level. 5. Physiatrist will provide 24 hour management of medical needs as well as oversight of the therapy plan/treatment and provide guidance as appropriate regarding the interaction of the two. 6. 24 hour rehab nursing will assist with bladder management, bowel management, safety, skin/wound care, disease management, medication administration, pain management and patient education and help integrate therapy concepts, techniques,education, etc. 7. PT will assess and treat for/with: Lower extremity strength, range of motion, stamina, balance, functional mobility, safety, adaptive techniques and equipment, pain mgt, education. Goals are: mod I. 8. OT will assess and  treat for/with: ADL's, functional mobility, safety, upper extremity strength, adaptive techniques and equipment, pain mgt, education. Goals are: mod I to supervision. 9. SLP will assess and treat for/with: n/a. Goals are: n/a. 10. Case Management and Social Worker will assess and treat for psychological issues and discharge planning. 11. Team conference will be held weekly to assess progress toward goals and to determine barriers to discharge. 12. Patient will receive at least 3 hours of therapy per day at least 5 days per week. 13. ELOS: 7-10 days Prognosis: excellent Medical Problem List and Plan:  1. DVT Prophylaxis/Anticoagulation: Mechanical: Sequential compression devices, below knee Bilateral lower extremities  2. Neuropathy: Pain LE improving. Continue hydrocodone prn. Monitor symptoms with increase activity/ambulation.  3. Mood: Flat affect. Offer ego support. Will have LCSW follow for evaluation.  4. Neuropsych: This patient is capable of making decisions on his own behalf.  5. HTN/CHF: Will monitor with bid checks. On Prinivil. Coreg decreased due to bradycardia. On lasix for fluid overload with recent diuresis. Follow weights/ I's and O's.  6. CAD: Continue Lipitor, ASA and coreg.  7. ABLA: Will monitor H/H. Recheck in am.  8. CKD: Baseline BUN/Cr 31/1.75. Encourage po intake. Continue Prinivil but monitor lytes intermittently.  9. Leucocytosis: Resolved. Will monitor wounds.  10. COPD: Respiratory status improved with diuresis. Continue to encourage IS. Continue nebs prn.  11. Constipation: finally resolving. Increase Senna to bid.            Ranelle Oyster, MD, [facility] Rehabilitation    08/19/2012

## 2012-08-19 NOTE — Progress Notes (Signed)
Physical Therapy Treatment Patient Details Name: [name] [name] MRN: [id number] DOB: Feb 01, 1932 Today's Date: 08/19/2012 Time: 8469-6295 PT Time Calculation (min): 18 min  PT Assessment / Plan / Recommendation Comments on Treatment Session  Patient continues to demonstrates some small improvements with acitivity. Still limited by overall fatique. Continue to recommend skilled PT in CIR setting to maximize independence prior to returning home.     Follow Up Recommendations  CIR     Does the patient have the potential to tolerate intense rehabilitation     Barriers to Discharge        Equipment Recommendations       Recommendations for Other Services    Frequency Min 3X/week   Plan Discharge plan remains appropriate;Frequency remains appropriate    Precautions / Restrictions Precautions Precautions: Fall   Pertinent Vitals/Pain no apparent distress     Mobility  Bed Mobility Supine to Sit: 4: Min assist Sitting - Scoot to Edge of Bed: 4: Min guard Details for Bed Mobility Assistance: cues for technique Transfers Sit to Stand: 4: Min assist;From elevated surface;With upper extremity assist;From bed Stand to Sit: 4: Min guard;With armrests;To chair/3-in-1;With upper extremity assist Details for Transfer Assistance: vc for hand placement and safe technique Ambulation/Gait Ambulation/Gait Assistance: 4: Min assist Ambulation Distance (Feet): 80 Feet Assistive device: Rolling walker Ambulation/Gait Assistance Details: Patient continues with instability especially with stops and turns. Cues for RW manangement/safety Gait Pattern: Step-through pattern;Trunk flexed;Wide base of support Gait velocity: decreased    Exercises General Exercises - Lower Extremity Long Arc Quad: Left;10 reps;Seated Hip Flexion/Marching: Left;10 reps;Seated   PT Diagnosis:    PT Problem List:   PT Treatment Interventions:     PT Goals Acute Rehab PT Goals PT Goal: Supine/Side to Sit -  Progress: Progressing toward goal PT Goal: Sit to Stand - Progress: Progressing toward goal PT Goal: Stand to Sit - Progress: Progressing toward goal PT Transfer Goal: Bed to Chair/Chair to Bed - Progress: Progressing toward goal PT Goal: Ambulate - Progress: Progressing toward goal  Visit Information  Last PT Received On: 08/19/12 Assistance Needed: +1    Subjective Data      Cognition  Cognition Arousal/Alertness: Awake/alert Behavior During Therapy: WFL for tasks assessed/performed Overall Cognitive Status: Within Functional Limits for tasks assessed    Balance  Static Standing Balance Static Standing - Balance Support: No upper extremity supported;During functional activity Static Standing - Level of Assistance: 4: Min assist  End of Session PT - End of Session Equipment Utilized During Treatment: Gait belt Activity Tolerance: Patient limited by fatigue;Patient tolerated treatment well Patient left: in chair;with call bell/phone within reach Nurse Communication: Mobility status   GP     Fredrich Birks 08/19/2012, 8:45 AM  08/19/2012 Fredrich Birks PTA [phone] pager [phone] office

## 2012-08-20 ENCOUNTER — Inpatient Hospital Stay (HOSPITAL_COMMUNITY): Payer: Medicare Other | Admitting: *Deleted

## 2012-08-20 ENCOUNTER — Inpatient Hospital Stay (HOSPITAL_COMMUNITY): Payer: Medicare Other | Admitting: Occupational Therapy

## 2012-08-20 DIAGNOSIS — I714 Abdominal aortic aneurysm, without rupture: Secondary | ICD-10-CM

## 2012-08-20 DIAGNOSIS — E8770 Fluid overload, unspecified: Secondary | ICD-10-CM

## 2012-08-20 DIAGNOSIS — R5381 Other malaise: Secondary | ICD-10-CM

## 2012-08-20 DIAGNOSIS — I739 Peripheral vascular disease, unspecified: Secondary | ICD-10-CM | POA: Diagnosis present

## 2012-08-20 DIAGNOSIS — I509 Heart failure, unspecified: Secondary | ICD-10-CM

## 2012-08-20 LAB — GLUCOSE, CAPILLARY: Glucose-Capillary: 138 mg/dL — ABNORMAL HIGH (ref 70–99)

## 2012-08-20 NOTE — Progress Notes (Signed)
Pt is 11 days post op from Aortobifemoral bypass with 18 x 9 Hemashield graft with extensive right femoral endarterectomy, resection of abdominal aortic aneurysm.  He is in CIR this am and at the sink bathing.  He states that he is feeling better this am.  Pt did have a BM yesterday with the lactulose and is feeling much better.  He states that his edema continues to improve and his legs are less edematous this am.  Making good progress.  Continue to monitor BMP.   Doreatha Massed, PA-C 08/20/2012 8:35 AM

## 2012-08-20 NOTE — Care Management Note (Signed)
[facility] Individual Statement of Services  Patient Name:  [name] [name]  Date:  08/20/2012  Welcome to the [facility].  Our goal is to provide you with an individualized program based on your diagnosis and situation, designed to meet your specific needs.  With this comprehensive rehabilitation program, you will be expected to participate in at least 3 hours of rehabilitation therapies Monday-Friday, with modified therapy programming on the weekends.  Your rehabilitation program will include the following services:  Physical Therapy (PT), Occupational Therapy (OT), 24 hour per day rehabilitation nursing, Case Management ( Social Worker), Rehabilitation Medicine, Nutrition Services and Pharmacy Services  Weekly team conferences will be held on Wednesday to discuss your progress.  Your Social Worker will talk with you frequently to get your input and to update you on team discussions.  Team conferences with you and your family in attendance may also be held.  Expected length of stay: 5 days  Overall anticipated outcome: mod/i level  Depending on your progress and recovery, your program may change. Your Social Worker will coordinate services and will keep you informed of any changes. Your Child psychotherapist names and contact numbers are listed  below.  The following services may also be recommended but are not provided by the [facility]:   Driving Evaluations  Home Health Rehabiltiation Services  Outpatient Rehabilitatation Servives    Arrangements will be made to provide these services after discharge if needed.  Arrangements include referral to agencies that provide these services.  Your insurance has been verified to be:  Medicare & BCBS Your primary doctor is:  Dr. Pearson Grippe  Pertinent information will be shared with your doctor and your insurance company.  Social Worker:  Dossie Der, [place] [phone]  Information  discussed with and copy given to patient by: Lucy Chris, 08/20/2012, 10:49 AM

## 2012-08-20 NOTE — Plan of Care (Signed)
Problem: RH BOWEL ELIMINATION Goal: RH STG MANAGE BOWEL W/MEDICATION W/ASSISTANCE STG Manage Bowel with Medication with minimal Assistance.  Outcome: Not Progressing Refusing Senokot-S

## 2012-08-20 NOTE — Evaluation (Signed)
Occupational Therapy Assessment and Plan  Patient Details  Name: [name] [name] MRN: [id number] Date of Birth: September 08, 1931  OT Diagnosis: acute pain and muscle weakness (generalized) Rehab Potential: Rehab Potential: Good ELOS: 4-5 days   Today's Date: 08/20/2012 Time: 0800-0930 and 130-220 Time Calculation (min): 90 min and 50 min  Problem List:  Patient Active Problem List   Diagnosis Date Noted  . PAD (peripheral artery disease)--bifemoral bypass graft. 08/20/2012  . Ischemic foot 08/20/2012  . Fluid overload 08/20/2012  . Heart block AV second degree - episodic 08/16/2012  . Peripheral vascular disease   . Smoker 08/15/2012  . COPD (chronic obstructive pulmonary disease) 08/15/2012  . Dyspnea 08/15/2012  . Preop cardiovascular exam 08/07/2012  . Abdominal aneurysm without mention of rupture 02/07/2012  . CAD (coronary artery disease)   . Neuropathy   . HTN (hypertension)   . Dyslipidemia   . CVA (cerebral vascular accident)   . AAA (abdominal aortic aneurysm)   . Renal artery stenosis   . Tobacco abuse   . Hx of CABG   . Ejection fraction     Past Medical History:  Past Medical History  Diagnosis Date  . HTN (hypertension)   . Dyslipidemia   . CVA (cerebral vascular accident)     1997, affected right side  . AAA (abdominal aortic aneurysm)     Also aneurysms of the infrarenal aorta and iliac arteries.. followed Dr. Edilia Bo  . Renal artery stenosis     Noted, 2005, medical therapy  . Tobacco abuse   . CAD (coronary artery disease)     Nuclear, August 28, 2011, EF 60%, small fixed defect at the apex consistent with apical thinning and moderate fixed inferobasal defect compatible with prior infarct, no ischemia  . Hx of CABG     2005  . Ejection fraction 2012    EF 65-70%   . Neuropathy     Right foot  . Myocardial infarction 2005  . Leg pain   . Abdominal pain   . Hyperlipidemia   . Nervousness(799.21)   . Peripheral vascular disease   . Carotid artery  occlusion   . COPD (chronic obstructive pulmonary disease) 08/15/2012   Past Surgical History:  Past Surgical History  Procedure Laterality Date  . Coronary artery bypass graft  2005    x4  . Spine surgery  1977    Lumbar HNP surgery  . Aorta - bilateral femoral artery bypass graft N/A 08/09/2012    Procedure: AORTA BIFEMORAL BYPASS GRAFT;  Surgeon: Larina Earthly, MD;  Location: [facility] OR;  Service: Vascular;  Laterality: N/A;    Assessment & Plan Clinical Impression: 79 y.o. male with history of HTN, CVA, AAA, tobacco abuse, PAD with severe right foot pain with limb threatening ischemia. He was admitted on 08/09/12 for AA with extensive right femoral endarterectomy and resection of AAA by Dr. Arbie Cookey. Post op with abdominal pain due to ileus. NGT discontinued and diet initiated yesterday. He has had complaints of dyspnea with wheezing and was treated with dose of lasix. CXR without acute findings.   Lovenox discontinued due to downward trend of H/H. Had had edema RLE therefore doppler done and were negative for DVT.  Patient transferred to CIR on 08/19/2012 .    Patient currently requires supervision-min assist with basic self-care skills and IADL secondary to muscle weakness, decreased cardiorespiratoy endurance and decreased standing balance, decreased balance strategies and RLE & abdominal pain.  Prior to hospitalization, patient was Modified I using  SPC with all BADL and some IADL to include laundry, simple snack/beverage prep, load and unload dishwasher, vacuum and driving.  Patient reports going out to eat lunch with his buddies a couple of times per week and he used to enjoy golfing.  Patient will benefit from skilled intervention to increase independence with basic self-care skills and increase level of independence with iADL prior to discharge home with care partner.  Anticipate patient will require intermittent supervision and no further OT follow recommended.  OT - End of Session Activity  Tolerance: Decreased this session Endurance Deficit: Yes Endurance Deficit Description: required 4 rest breaks during self care session in am to include pursed lip breathing to stabilize SPO2 and HR OT Assessment Rehab Potential: Good Barriers to Discharge: None OT Plan OT Intensity: Minimum of 1-2 x/day, 45 to 90 minutes OT Frequency: 5 out of 7 days OT Duration/Estimated Length of Stay: 4-5 days OT Treatment/Interventions: Balance/vestibular training;Community reintegration;Discharge planning;Functional mobility training;DME/adaptive equipment instruction;Pain management;Patient/family education;Psychosocial support;Therapeutic Activities;Self Care/advanced ADL retraining;Therapeutic Exercise;UE/LE Strength taining/ROM;UE/LE Coordination activities OT Recommendation Patient destination: Home Equipment Recommended: Tub/shower bench  Skilled Therapeutic Intervention 1)  OT evaluation, self care retraining to include sponge bath (declined shower), dressing and grooming.  Focused session on activity tolerance, pursed lip breathing during rest breaks to raise SPO2 from 89%-98% and reduce HR from 92-84.  Patient ambulate with RW to sink and stood for most of his sponge bath.  BUE MMT limited due to abdominal pain during core stability to withstand MMT.  Patient reports that he hopes he can [place] a single point cane like he has at home before he discharges.  2)    OT Evaluation Precautions/Restrictions  Precautions Precautions: Fall Restrictions Weight Bearing Restrictions: No Pain 1)  4/10 RLE, rest, repositioned, and pre-medicated 2)   Home Living/Prior Functioning Home Living Lives With: Spouse Available Help at Discharge: Family;Available 24 hours/day Type of Home: House Home Access: Stairs to enter [employer] of Steps: 7 Entrance Stairs-Rails: Right;Left Home Layout: One level Bathroom Shower/Tub: Forensic scientist: Standard Bathroom  Accessibility: Yes How Accessible: Accessible via walker Home Adaptive Equipment: Straight cane;Grab bars in shower;Reacher Additional Comments: shower grab bar is on wall outside shower Prior Function Level of Independence: Independent with basic ADLs;Independent with homemaking with ambulation;Independent with gait;Independent with transfers (used single point cane for all mobility) Comments: laundry, dishwasher, vacuum ADL Refer to FIM levels below Vision/Perception  Vision - History Baseline Vision: Wears glasses only for reading Patient Visual Report: No change from baseline  Cognition Overall Cognitive Status: Within Functional Limits for tasks assessed Arousal/Alertness: Awake/alert Orientation Level: Oriented X4 Sensation Sensation Light Touch: Appears Intact (BUEs) Hot/Cold: Appears Intact (BUEs) Coordination Gross Motor Movements are Fluid and Coordinated: No Fine Motor Movements are Fluid and Coordinated: No Coordination and Movement Description: RUE decreased rhythm, accuracy and speed with rapid alternating movements, isolated finger movements.  Patient reports this is probably residual from CVA in 1997. Motor  Motor: Within Functional Limits, H/O CVA in 1997 with residual weakness most apparent in RLE Mobility  Bed Mobility Bed Mobility: Supine to Sit;Sit to Supine Supine to Sit: 4: Min assist Supine to Sit Details: Manual facilitation for placement Sit to Supine: 4: Min assist Sit to Supine - Details: Manual facilitation for placement Transfers Sit to Stand: 4: Min assist;With upper extremity assist;From bed;From chair/3-in-1;With armrests Sit to Stand Details: Tactile cues for weight shifting;Verbal cues for precautions/safety;Manual facilitation for weight shifting Sit to Stand Details (indicate cue type and reason):  In PM, Patient Mod I for sit-stand EOB and in chair to use urinal Stand to Sit: 4: Min guard;With armrests;To bed;To chair/3-in-1  Trunk/Postural  Assessment  Cervical Assessment Cervical Assessment: Within Functional Limits Thoracic Assessment Thoracic Assessment: Within Functional Limits Lumbar Assessment Lumbar Assessment: Exceptions to [facility] (posterior pelvic tilt) Postural Control Postural Control: Within Functional Limits  Balance Balance Balance Assessed: Yes Dynamic Sitting Balance Dynamic Sitting - Balance Support: No upper extremity supported;Feet supported Dynamic Sitting - Level of Assistance: 5: Stand by assistance Static Standing Balance Static Standing - Balance Support: No upper extremity supported;During functional activity Static Standing - Level of Assistance: 5: Stand by assistance (In PM, patient Mod I to stand EOB or from chair to use urina) Dynamic Standing Balance Dynamic Standing - Balance Support: Bilateral upper extremity supported Dynamic Standing - Level of Assistance: 4: Min assist Dynamic Standing - Balance Activities: Lateral lean/weight shifting;Reaching for objects;Forward lean/weight shifting;Reaching across midline Extremity/Trunk Assessment RUE Assessment RUE Assessment: Exceptions to [facility] RUE AROM (degrees) RUE Overall AROM Comments: WFL except end ranges of shoulder movements RUE Strength RUE Overall Strength Comments: Unable to fully asses due to reports of abdominal pain (surgery site).  Grossly WFL during BADL LUE Assessment LUE Assessment: Exceptions to [facility] LUE Strength LUE Overall Strength Comments: Unable to full assess strength due to reports of abdominal pain (surgery site).  Grossly WFL during BADL  FIM:  FIM - Eating Eating Activity: 7: Complete independence:no helper FIM - Grooming Grooming Steps: Wash, rinse, dry face;Wash, rinse, dry hands;Brush, comb hair Grooming: 5: Set-up assist to obtain items FIM - Bathing Bathing Steps Patient Completed: Chest;Right Arm;Left Arm;Abdomen;Front perineal area;Buttocks;Right upper leg;Left upper leg;Right lower leg (including  foot);Left lower leg (including foot) Bathing: 5: Set-up assist to: Obtain items FIM - Upper Body Dressing/Undressing Upper body dressing/undressing steps patient completed: Thread/unthread right sleeve of pullover shirt/dresss;Thread/unthread left sleeve of pullover shirt/dress;Put head through opening of pull over shirt/dress;Pull shirt over trunk Upper body dressing/undressing: 5: Set-up assist to: Obtain clothing/put away FIM - Lower Body Dressing/Undressing Lower body dressing/undressing steps patient completed: Thread/unthread right underwear leg;Thread/unthread left underwear leg;Pull underwear up/down;Thread/unthread right pants leg;Thread/unthread left pants leg;Pull pants up/down;Don/Doff right sock;Don/Doff left sock;Don/Doff left shoe Lower body dressing/undressing: 4: Min-Patient completed 75 plus % of tasks FIM - Tub/Shower Transfers Tub/shower Transfers: 0-Activity did not occur or was simulated (declined)   Refer to Care Plan for Long Term Goals  Recommendations for other services: None  Discharge Criteria: Patient will be discharged from OT if patient refuses treatment 3 consecutive times without medical reason, if treatment goals not met, if there is a change in medical status, if patient makes no progress towards goals or if patient is discharged from hospital.  The above assessment, treatment plan, treatment alternatives and goals were discussed and mutually agreed upon: by patient  Mimi Debellis 08/20/2012, 4:19 PM

## 2012-08-20 NOTE — Progress Notes (Signed)
Patient information reviewed and entered into eRehab system by Ahnya Akre, RN, CRRN, PPS Coordinator.  Information including medical coding and functional independence measure will be reviewed and updated through discharge.     Per nursing patient was given "Data Collection Information Summary for Patients in Inpatient Rehabilitation Facilities with attached "Privacy Act Statement-Health Care Records" upon admission.  

## 2012-08-20 NOTE — Progress Notes (Signed)
Social Work Assessment and Plan Social Work Assessment and Plan  Patient Details  Name: [name] [name] MRN: [id number] Date of Birth: 1931-11-28  Today's Date: 08/20/2012  Problem List:  Patient Active Problem List   Diagnosis Date Noted  . PAD (peripheral artery disease)--bifemoral bypass graft. 08/20/2012  . Ischemic foot 08/20/2012  . Fluid overload 08/20/2012  . Heart block AV second degree - episodic 08/16/2012  . Peripheral vascular disease   . Smoker 08/15/2012  . COPD (chronic obstructive pulmonary disease) 08/15/2012  . Dyspnea 08/15/2012  . Preop cardiovascular exam 08/07/2012  . Abdominal aneurysm without mention of rupture 02/07/2012  . CAD (coronary artery disease)   . Neuropathy   . HTN (hypertension)   . Dyslipidemia   . CVA (cerebral vascular accident)   . AAA (abdominal aortic aneurysm)   . Renal artery stenosis   . Tobacco abuse   . Hx of CABG   . Ejection fraction    Past Medical History:  Past Medical History  Diagnosis Date  . HTN (hypertension)   . Dyslipidemia   . CVA (cerebral vascular accident)     1997, affected right side  . AAA (abdominal aortic aneurysm)     Also aneurysms of the infrarenal aorta and iliac arteries.. followed Dr. Edilia Bo  . Renal artery stenosis     Noted, 2005, medical therapy  . Tobacco abuse   . CAD (coronary artery disease)     Nuclear, August 28, 2011, EF 60%, small fixed defect at the apex consistent with apical thinning and moderate fixed inferobasal defect compatible with prior infarct, no ischemia  . Hx of CABG     2005  . Ejection fraction 2012    EF 65-70%   . Neuropathy     Right foot  . Myocardial infarction 2005  . Leg pain   . Abdominal pain   . Hyperlipidemia   . Nervousness(799.21)   . Peripheral vascular disease   . Carotid artery occlusion   . COPD (chronic obstructive pulmonary disease) 08/15/2012   Past Surgical History:  Past Surgical History  Procedure Laterality Date  . Coronary artery  bypass graft  2005    x4  . Spine surgery  1977    Lumbar HNP surgery  . Aorta - bilateral femoral artery bypass graft N/A 08/09/2012    Procedure: AORTA BIFEMORAL BYPASS GRAFT;  Surgeon: Larina Earthly, MD;  Location: [facility];  Service: Vascular;  Laterality: N/A;   Social History:  reports that he has been smoking Cigarettes.  He has a 25 pack-year smoking history. He does not have any smokeless tobacco history on file. He reports that  drinks alcohol. He reports that he does not use illicit drugs.  Family / Support Systems Marital Status: Married How Long?: 27 years Patient Roles: Spouse;Parent;Other (Comment) (Retiree) Spouse/Significant Other: nancy  [phone]-home  [phone]-work  [phone] Children: Pt has three children local-wife has a son local Anticipated Caregiver: Wife and children Ability/Limitations of Caregiver: Someone to be there with pt once goes home-short time anyway Caregiver Availability: 24/7 Family Dynamics: Close knit family who are there for one another.  Pt states; ' If I need somone they can be there and my wife works close byMicrosoft  Social History Preferred language: English Religion: Protestant Cultural Background: No issues Education: [employer] Read: Yes Write: Yes Employment Status: Retired Fish farm manager Issues: No issues Guardian/Conservator: None-according to MD pt is capable of mkaing his own decisions   Abuse/Neglect Physical Abuse: Denies Verbal  Abuse: Denies Sexual Abuse: Denies Exploitation of patient/patient's resources: Denies Self-Neglect: Denies  Emotional Status Pt's affect, behavior adn adjustment status: Pt is motivated to improve and get out of here by the weekend.  He reports: " I don't like hospitals and feel like I am moving pretty well."  He reprots each day he is feeling better. Recent Psychosocial Issues: Other medical issues-residual weakness and pain in right leg.  He reports it has been getting worse over the  years. Pyschiatric History: No history-deferred depression screen due to pt felt not necessary and feels he is dealing appropriately with his surgery and issues.  He is grateful to be doing so well after such major surgery, he states: " The scar is something else, made me sick." Substance Abuse History: Tobacco-pack a day plans to quit now.  Aware of the resouces out there for him to access.  No other substance issues  Patient / Family Perceptions, Expectations & Goals Pt/Family understanding of illness & functional limitations: Pt and wife have a good understanding of his surgery and eficits as a result.  He feels each day he feels a little bit better than the day before.  He wants to get home soon.  Will check with team. Premorbid pt/family roles/activities: Husband, father, grandfather, retiree, Mining engineer, etc Anticipated changes in roles/activities/participation: resume Pt/family expectations/goals: Pt states; " I want to be home by the weekend, I feel like I am moving around well enought to manage at home."  Wife states; " I want him to be medically ready and physically ready."  [employer]: None Premorbid Home Care/DME Agencies: None Transportation available at discharge: Family Resource referrals recommended: Support group (specify)  Discharge Planning Living Arrangements: Spouse/significant other Support Systems: Spouse/significant other;Children;Friends/neighbors;Church/faith community Type of Residence: Private residence Community education officer Resources: Administrator (specify) Herbalist) Financial Resources: Tree surgeon;Family Support Financial Screen Referred: Yes Living Expenses: Lives with family Money Management: Patient;Spouse Do you have any problems obtaining your medications?: No Home Management: Wife Patient/Family Preliminary Plans: Return home with wife who works part time for her son but is very flexible.  Someone will be there with him  for a short time, until he is able to be mod/i level.  He is moving well and needs to work on his strengthening.   Social Work Anticipated Follow Up Needs: HH/OP;Support Group  Clinical Impression Pleasant gentleman who is open and talkative regarding his surgery and want to return home by the weekend.  Supportive family who can provide supervision for a short time. Will await team's evaluations and work on a safe discharge plan.  Lucy Chris 08/20/2012, 11:22 AM

## 2012-08-20 NOTE — Progress Notes (Signed)
Patient ID: [name] [name], male   DOB: [birth year], 79 y.o.   MRN: [id number] Subjective/Complaints: 79 y.o. male with history of HTN, CVA, AAA, tobacco abuse, PAD with severe right foot pain with limb threatening ischemia. He was admitted on 08/09/12 for AA with extensive right femoral endarterectomy and resection of AAA by Dr. Arbie Cookey. Post op with abdominal pain due to ileus. NGT discontinued and diet initiated yesterday. He has had complaints of dyspnea with wheezing and was treated with dose of lasix. CXR without acute findings.  Lovenox discontinued due to downward trend of H/H. Had had edema RLE therefore doppler done today and were negative for DVT  Right sided weakness since stroke 1997 Review of Systems  Musculoskeletal: Negative for falls.  Neurological: Positive for sensory change and focal weakness.  All other systems reviewed and are negative.   Objective: Vital Signs: Blood pressure 149/76, pulse 69, temperature 98.1 F (36.7 C), temperature source Oral, resp. rate 17, weight 93.5 kg (206 lb 2.1 oz), SpO2 96.00%. No results found. Results for orders placed during the hospital encounter of 08/19/12 (from the past 72 hour(s))  GLUCOSE, CAPILLARY     Status: Abnormal   Collection Time    08/19/12  4:31 PM      Result Value Range   Glucose-Capillary 105 (*) 70 - 99 mg/dL  CBC WITH DIFFERENTIAL     Status: Abnormal   Collection Time    08/19/12  6:45 PM      Result Value Range   WBC 10.8 (*) 4.0 - 10.5 K/uL   RBC 3.30 (*) 4.22 - 5.81 MIL/uL   Hemoglobin 9.9 (*) 13.0 - 17.0 g/dL   HCT 78.2 (*) 95.6 - 21.3 %   MCV 95.2  78.0 - 100.0 fL   MCH 30.0  26.0 - 34.0 pg   MCHC 31.5  30.0 - 36.0 g/dL   RDW 08.6  57.8 - 46.9 %   Platelets 352  150 - 400 K/uL   Neutrophils Relative % 65  43 - 77 %   Neutro Abs 7.1  1.7 - 7.7 K/uL   Lymphocytes Relative 21  12 - 46 %   Lymphs Abs 2.3  0.7 - 4.0 K/uL   Monocytes Relative 9  3 - 12 %   Monocytes Absolute 0.9  0.1 - 1.0 K/uL   Eosinophils Relative 4  0 - 5 %   Eosinophils Absolute 0.4  0.0 - 0.7 K/uL   Basophils Relative 1  0 - 1 %   Basophils Absolute 0.1  0.0 - 0.1 K/uL  COMPREHENSIVE METABOLIC PANEL     Status: Abnormal   Collection Time    08/19/12  6:45 PM      Result Value Range   Sodium 139  135 - 145 mEq/L   Potassium 4.2  3.5 - 5.1 mEq/L   Chloride 102  96 - 112 mEq/L   CO2 29  19 - 32 mEq/L   Glucose, Bld 180 (*) 70 - 99 mg/dL   BUN 24 (*) 6 - 23 mg/dL   Creatinine, Ser 6.29 (*) 0.50 - 1.35 mg/dL   Calcium 9.0  8.4 - 52.8 mg/dL   Total Protein 6.2  6.0 - 8.3 g/dL   Albumin 2.6 (*) 3.5 - 5.2 g/dL   AST 31  0 - 37 U/L   ALT 21  0 - 53 U/L   Alkaline Phosphatase 65  39 - 117 U/L   Total Bilirubin 0.4  0.3 - 1.2 mg/dL  GFR calc non Af Amer 39 (*) >90 mL/min   GFR calc Af Amer 45 (*) >90 mL/min   Comment:            The eGFR has been calculated     using the CKD EPI equation.     This calculation has not been     validated in all clinical     situations.     eGFR's persistently     <90 mL/min signify     possible Chronic Kidney Disease.  GLUCOSE, CAPILLARY     Status: Abnormal   Collection Time    08/19/12  9:13 PM      Result Value Range   Glucose-Capillary 163 (*) 70 - 99 mg/dL     HEENT: normal and no facial droop Cardio: RRR Resp: CTA B/L GI: BS positive and non distended Extremity:  Pulses positive and Edema pretibial 1+ Skin:   Wound C/D/I Neuro: Alert/Oriented, Abnormal Sensory hypersensitive Right leg, Abnormal Motor 4/5 in Bilateral delt,bi,tri, grip,  and Tone:  3+ DTR on Right side Musc/Skel:  Normal and Extremity tender Right leg Gen NAD   Assessment/Plan: 1. Functional deficits secondary to deconditioning AAA R femoral entarterectomy 6/6/14which require 3+ hours per day of interdisciplinary therapy in a comprehensive inpatient rehab setting. Physiatrist is providing close team supervision and 24 hour management of active medical problems listed below. Physiatrist and  rehab team continue to assess barriers to discharge/monitor patient progress toward functional and medical goals. FIM:                   Comprehension Comprehension Mode: Auditory Comprehension: 5-Understands complex 90% of the time/Cues < 10% of the time  Expression Expression Mode: Verbal Expression: 6-Expresses complex ideas: With extra time/assistive device  Social Interaction Social Interaction: 6-Interacts appropriately with others with medication or extra time (anti-anxiety, antidepressant).  Problem Solving Problem Solving: 5-Solves basic problems: With no assist  Memory Memory: 6-More than reasonable amt of time  Medical Problem List and Plan:  1. DVT Prophylaxis/Anticoagulation: Mechanical: Sequential compression devices, below knee Bilateral lower extremities  2. Neuropathy: Pain LE improving. Continue hydrocodone prn. Monitor symptoms with increase activity/ambulation.  3. Mood: Flat affect. Offer ego support. Will have LCSW follow for evaluation.  4. Neuropsych: This patient is capable of making decisions on his own behalf.  5. HTN/CHF: Will monitor with bid checks. On Prinivil. Coreg decreased due to bradycardia. On lasix for fluid overload with recent diuresis. Follow weights/ I's and O's.  6. CAD: Continue Lipitor, ASA and coreg.  7. ABLA: Will monitor H/H. Recheck in am.  8. CKD: Baseline BUN/Cr 31/1.75. Encourage po intake. Continue Prinivil but monitor lytes intermittently.  9. Leucocytosis: Resolved. Will monitor wounds.  10. COPD: Respiratory status improved with diuresis. Continue to encourage IS. Continue nebs prn.  11. Constipation: finally resolving. Increase Senna to bid.   LOS (Days) 1 A FACE TO FACE EVALUATION WAS PERFORMED  Jermery Caratachea E 08/20/2012, 7:34 AM

## 2012-08-20 NOTE — Progress Notes (Signed)
Physical Therapy Assessment and Plan  Patient Details  Name: [name] [name] MRN: [id number] Date of Birth: [birth year]  PT Diagnosis: Abnormality of gait, Difficulty walking, Edema, Hemiplegia dominant, Impaired sensation and Muscle weakness Rehab Potential: Good ELOS: 5 days   Today's Date: 08/20/2012 Time: 1030-1130 Time Calculation (min): 60 min  Problem List:  Patient Active Problem List   Diagnosis Date Noted  . PAD (peripheral artery disease)--bifemoral bypass graft. 08/20/2012  . Ischemic foot 08/20/2012  . Fluid overload 08/20/2012  . Heart block AV second degree - episodic 08/16/2012  . Peripheral vascular disease   . Smoker 08/15/2012  . COPD (chronic obstructive pulmonary disease) 08/15/2012  . Dyspnea 08/15/2012  . Preop cardiovascular exam 08/07/2012  . Abdominal aneurysm without mention of rupture 02/07/2012  . CAD (coronary artery disease)   . Neuropathy   . HTN (hypertension)   . Dyslipidemia   . CVA (cerebral vascular accident)   . AAA (abdominal aortic aneurysm)   . Renal artery stenosis   . Tobacco abuse   . Hx of CABG   . Ejection fraction     Past Medical History:  Past Medical History  Diagnosis Date  . HTN (hypertension)   . Dyslipidemia   . CVA (cerebral vascular accident)     1997, affected right side  . AAA (abdominal aortic aneurysm)     Also aneurysms of the infrarenal aorta and iliac arteries.. followed Dr. Edilia Bo  . Renal artery stenosis     Noted, 2005, medical therapy  . Tobacco abuse   . CAD (coronary artery disease)     Nuclear, August 28, 2011, EF 60%, small fixed defect at the apex consistent with apical thinning and moderate fixed inferobasal defect compatible with prior infarct, no ischemia  . Hx of CABG     2005  . Ejection fraction 2012    EF 65-70%   . Neuropathy     Right foot  . Myocardial infarction 2005  . Leg pain   . Abdominal pain   . Hyperlipidemia   . Nervousness(799.21)   . Peripheral vascular disease   .  Carotid artery occlusion   . COPD (chronic obstructive pulmonary disease) 08/15/2012   Past Surgical History:  Past Surgical History  Procedure Laterality Date  . Coronary artery bypass graft  2005    x4  . Spine surgery  1977    Lumbar HNP surgery  . Aorta - bilateral femoral artery bypass graft N/A 08/09/2012    Procedure: AORTA BIFEMORAL BYPASS GRAFT;  Surgeon: Larina Earthly, MD;  Location: [facility] OR;  Service: Vascular;  Laterality: N/A;    Assessment & Plan Clinical Impression: [name] [name] is a 79 y.o. male with history of HTN, CVA, AAA, tobacco abuse, PAD with severe right foot pain with limb threatening ischemia. He was admitted on 08/09/12 for AA with extensive right femoral endarterectomy and resection of AAA by Dr. Arbie Cookey. Post op with abdominal pain due to ileus. NGT discontinued and diet initiated yesterday. He has had complaints of dyspnea with wheezing and was treated with dose of lasix. CXR without acute findings.  Lovenox discontinued due to downward trend of H/H. Had had edema RLE therefore doppler done today and were negative for DVT. Doppler signals improving on left and right toes remain ruborous and painful. He developed bradycardia and coreg decreased per cardiology input. Therapies initiated and patient noted to be deconditioned. Team recommended CIR and patient admitted today.  Patient transferred to CIR on 08/19/2012 .  Patient currently requires mod with mobility secondary to decreased cardiorespiratoy endurance and unbalanced muscle activation and decreased coordination.  Prior to hospitalization, patient was independent  with mobility in the home and Mod I in community with cane, and lived with Spouse in a House home.  Home access is 7 (5+2)Stairs to enter.  Patient will benefit from skilled PT intervention to maximize safe functional mobility, minimize fall risk and decrease caregiver burden for planned discharge home with 24 hour supervision.  Anticipate patient will  benefit from follow up HH at discharge.  PT - End of Session Activity Tolerance: Tolerates < 10 min activity, no significant change in vital signs Endurance Deficit: Yes Endurance Deficit Description: Required seated rest breaks after short distance ambulation PT Assessment Rehab Potential: Good Barriers to Discharge: None PT Plan PT Intensity: Minimum of 1-2 x/day ,45 to 90 minutes PT Frequency: 5 out of 7 days PT Duration Estimated Length of Stay: 5 days PT Treatment/Interventions: Ambulation/gait training;Balance/vestibular training;Discharge planning;Disease management/prevention;DME/adaptive equipment instruction;Functional mobility training;Neuromuscular re-education;Pain management;Patient/family education;Psychosocial support;Splinting/orthotics;Stair training;Therapeutic Activities;Therapeutic Exercise;UE/LE Strength taining/ROM;UE/LE Coordination activities;Wheelchair propulsion/positioning PT Recommendation Follow Up Recommendations: Home health PT;24 hour supervision/assistance Patient destination: Home Equipment Recommended: Rolling walker with 5" wheels;Wheelchair (measurements);Wheelchair cushion (measurements) Equipment Details: May need WC for community access  Skilled Therapeutic Intervention  Tx initiated this session with gait training with RW. Pt instructed in RW management, gait pattern, and unweighting RLE for decreased pain. Pt needing only min-guard A with RW 1x80' and 1x25' with cues for equalizing step length as able. Pt demonstrates decreased R hip ext, knee control, increased R step length, and discontinuous steps. Distance limited by R foot pain today as well as fatigue.   PT Evaluation Precautions/Restrictions Precautions Precautions: Fall Restrictions Weight Bearing Restrictions: No General Chart Reviewed: Yes Family/Caregiver Present: No Vital SignsTherapy Vitals Pulse Rate: 82 (after walk) Oxygen Therapy SpO2: 95 % O2 Device: None (Room  air) Pain Pain Assessment Pain Assessment: 0-10 Pain Score:   6 Pain Type: Acute pain Pain Location: Foot Pain Orientation: Right Pain Descriptors / Indicators: Aching Pain Frequency: Intermittent Pain Onset: Gradual Patients Stated Pain Goal: 3 Pain Intervention(s): Medication (See eMAR);Repositioned Multiple Pain Sites: No Home Living/Prior Functioning Home Living Lives With: Spouse Available Help at Discharge: Family;Available 24 hours/day Type of Home: House Home Access: Stairs to enter [employer] of Steps: 7 (5+2) Entrance Stairs-Rails: Right;Left Home Layout: One level Bathroom Shower/Tub: Forensic scientist: Standard Bathroom Accessibility: Yes How Accessible: Accessible via walker Home Adaptive Equipment: Straight cane;Grab bars in shower;Reacher Additional Comments: shower grab bar is on wall outside shower Prior Function Level of Independence: Independent with basic ADLs;Independent with homemaking with ambulation;Independent with gait;Independent with transfers Able to Take Stairs?: Yes Driving: Yes Vocation: Retired Web designer) Comments: Not active recently, gets out to visit friends occasionally Vision/Perception  Vision - History Baseline Vision: Wears glasses all the time Patient Visual Report: No change from baseline  Cognition Overall Cognitive Status: Within Functional Limits for tasks assessed Arousal/Alertness: Awake/alert Orientation Level: Oriented X4 Sensation Sensation Light Touch: Impaired Detail Light Touch Impaired Details: Impaired RLE (Highly sensitive foot ) Hot/Cold: Appears Intact (BUEs) Coordination Gross Motor Movements are Fluid and Coordinated: No Fine Motor Movements are Fluid and Coordinated: No Coordination and Movement Description: Decreased coordination with RLE for accuracy, speed, and timing during mobility and gait Heel Shin Test: Unable to perform with R on L Motor  Motor Motor:  Hemiplegia (Residual impairments from old CVA) Motor - Skilled Clinical Observations: Decreased strength throughout RLE  Mobility Bed Mobility Bed Mobility: Supine to Sit;Sit to Supine Supine to Sit: 4: Min assist Supine to Sit Details: Manual facilitation for placement Supine to Sit Details (indicate cue type and reason): Assist for RLE lifting Sit to Supine: 4: Min assist Sit to Supine - Details: Manual facilitation for placement Sit to Supine - Details (indicate cue type and reason): RLE lowering Transfers Sit to Stand: 4: Min assist;With upper extremity assist;From bed;From chair/3-in-1;With armrests Sit to Stand Details: Tactile cues for weight shifting;Verbal cues for precautions/safety;Manual facilitation for weight shifting Sit to Stand Details (indicate cue type and reason): Steadying assist as pt tends to favor RLE Stand to Sit: 4: Min guard;With armrests;To bed;To chair/3-in-1 Locomotion  Ambulation Ambulation: Yes Ambulation/Gait Assistance: 3: Mod assist Ambulation Distance (Feet): 10 Feet Assistive device: None Ambulation/Gait Assistance Details: Tactile cues for weight shifting;Verbal cues for technique;Manual facilitation for weight shifting;Manual facilitation for placement;Manual facilitation for weight bearing Ambulation/Gait Assistance Details: Assist to unweight RLE. Pt with decreased step length, esp with L step, decreased R knee stability and hip ext  Stairs / Additional Locomotion Stairs: Yes Stairs Assistance: 3: Mod assist Stairs Assistance Details: Verbal cues for sequencing;Verbal cues for technique;Verbal cues for precautions/safety;Verbal cues for gait pattern;Manual facilitation for weight bearing;Manual facilitation for placement Stairs Assistance Details (indicate cue type and reason): Steadying assist needed, especially with descent and 1 rail. Pt needing cues for sequence with step-to pattern Stair Management Technique: One rail Right;One rail Left;Step  to pattern;Forwards Number of Stairs: 3 Height of Stairs: 6 Corporate treasurer: Yes Wheelchair Assistance: 5: Financial planner Details: Verbal cues for technique;Verbal cues for Copy: Both upper extremities Wheelchair Parts Management: Needs assistance Distance: 100  Trunk/Postural Assessment  Cervical Assessment Cervical Assessment: Within Functional Limits Thoracic Assessment Thoracic Assessment: Within Functional Limits Lumbar Assessment Lumbar Assessment: Exceptions to [facility] (posterior pelvic tilt) Postural Control Postural Control: Within Functional Limits  Balance Balance Balance Assessed: Yes Dynamic Sitting Balance Dynamic Sitting - Balance Support: No upper extremity supported;Feet supported Dynamic Sitting - Level of Assistance: 5: Stand by assistance Static Standing Balance Static Standing - Balance Support: No upper extremity supported;During functional activity Static Standing - Level of Assistance: 5: Stand by assistance Dynamic Standing Balance Dynamic Standing - Balance Support: Bilateral upper extremity supported Dynamic Standing - Level of Assistance: 4: Min assist Dynamic Standing - Balance Activities: Lateral lean/weight shifting;Reaching for objects;Forward lean/weight shifting;Reaching across midline Extremity Assessment  RUE Assessment RUE Assessment: Exceptions to [facility] RUE AROM (degrees) RUE Overall AROM Comments: WFL except end ranges of shoulder movements, per OT RUE Strength RUE Overall Strength Comments: Unable to fully asses due to reports of abdominal pain (surgery site).  Grossly WFL during BADL, per OT eval LUE Assessment LUE Assessment: Exceptions to [facility] LUE Strength LUE Overall Strength Comments: Unable to full assess strength due to reports of abdominal pain (surgery site).  Grossly WFL during BADL RLE Assessment RLE Assessment: Exceptions to [facility] RLE Strength RLE Overall  Strength Comments: Grossly 2+/5 throughout, pt reports has become increasingly weak since CVA in 1997 LLE Assessment LLE Assessment: Exceptions to [facility] LLE Strength LLE Overall Strength Comments: Hip flexion 3+/5, all other motions grossly 4/5  FIM:  FIM - Locomotion: Wheelchair Distance: 100 FIM - Locomotion: Ambulation Ambulation/Gait Assistance: 3: Mod assist   Refer to Care Plan for Long Term Goals    Recommendations for other services: None  Discharge Criteria: Patient will be discharged from PT if patient refuses treatment 3 consecutive times without  medical reason, if treatment goals not met, if there is a change in medical status, if patient makes no progress towards goals or if patient is discharged from hospital.  The above assessment, treatment plan, treatment alternatives and goals were discussed and mutually agreed upon: by patient   Clydene Laming, PT, DPT  08/20/2012, 11:50 AM

## 2012-08-21 ENCOUNTER — Inpatient Hospital Stay (HOSPITAL_COMMUNITY): Payer: Medicare Other | Admitting: Occupational Therapy

## 2012-08-21 ENCOUNTER — Inpatient Hospital Stay (HOSPITAL_COMMUNITY): Payer: Medicare Other | Admitting: *Deleted

## 2012-08-21 ENCOUNTER — Inpatient Hospital Stay (HOSPITAL_COMMUNITY): Payer: Medicare Other

## 2012-08-21 LAB — GLUCOSE, CAPILLARY: Glucose-Capillary: 125 mg/dL — ABNORMAL HIGH (ref 70–99)

## 2012-08-21 MED ORDER — PENTAFLUOROPROP-TETRAFLUOROETH EX AERO
INHALATION_SPRAY | Freq: Once | CUTANEOUS | Status: AC
  Administered 2012-08-21: 18:00:00 via TOPICAL
  Filled 2012-08-21: qty 103.5

## 2012-08-21 MED ORDER — SORBITOL 70 % SOLN
30.0000 mL | Freq: Every day | Status: DC | PRN
  Administered 2012-08-21: 30 mL via ORAL

## 2012-08-21 NOTE — Progress Notes (Signed)
Physical Therapy Session Note  Patient Details  Name: [name] [name] MRN: [id number] Date of Birth: 04-Jul-1931  Today's Date: 08/21/2012 Time: 4098-1191 Time Calculation (min): 25 min  Skilled Therapeutic Interventions/Progress Updates:    Patient received sitting in recliner. Session focused on gait training, stair negotiation, and RW management and safety with obstacle course. See details below for gait training and stair negotiation. Patient completed RW obstacle course x2 requiring him to step over 2 bolsters and weave in/out of 3 cones. Patient demonstrates good safety with RW, maintains BOS within RW, and safe sequencing with negotiation of obstacles. Patient required verbal cues one time when his RW hit one of the cones. No overt LOB during obstacle course. Patient returned to room and left seated edge of bed with RN present to remove staples.  Therapy Documentation Precautions:  Precautions Precautions: Fall Restrictions Weight Bearing Restrictions: No  Pain: Pain Assessment Pain Assessment: No/denies pain Pain Score: 0-No pain Locomotion : Ambulation Ambulation: Yes Ambulation/Gait Assistance: 5: Supervision Ambulation Distance (Feet): 174 Feetx2 Assistive device: Rolling walker Ambulation/Gait Assistance Details: Verbal cues for precautions/safety Ambulation/Gait Assistance Details: Patient instructed in gait training 174' x2 in controlled environment. Patient requires verbal cues for proper hand placement for sit>stand prior to ambulation. Gait Gait: Yes Gait Pattern: Step-through pattern;Trunk flexed;Wide base of support Stairs / Additional Locomotion Stairs: Yes Stairs Assistance: 5: Supervision Stairs Assistance Details: Verbal cues for precautions/safety Stairs Assistance Details (indicate cue type and reason): alternating pattern ascending, step to pattern descending Stair Management Technique: One rail Left;Alternating pattern;Step to pattern;Forwards Number  of Stairs: 5 Height of Stairs: 6   See FIM for current functional status  Therapy/Group: Individual Therapy  Chipper Herb. Arin Peral, PT, DPT  08/21/2012, 4:33 PM

## 2012-08-21 NOTE — Progress Notes (Signed)
Patient ID: [name] [name], male   DOB: January 08, 1932, 79 y.o.   MRN: [id number] Subjective/Complaints: 79 y.o. male with history of HTN, CVA, AAA, tobacco abuse, PAD with severe right foot pain with limb threatening ischemia. He was admitted on 08/09/12 for AA with extensive right femoral endarterectomy and resection of AAA by Dr. Arbie Cookey. Post op with abdominal pain due to ileus. NGT discontinued and diet initiated yesterday. He has had complaints of dyspnea with wheezing and was treated with dose of lasix. CXR without acute findings.  Lovenox discontinued due to downward trend of H/H. Had had edema RLE therefore doppler done today and were negative for DVT  Right sided weakness since stroke 1997 Abdominal pain. Ms. to one of his pain medication doses. Positive constipation last bowel movement 2 days ago. No nausea or vomiting Review of Systems  Musculoskeletal: Negative for falls.  Neurological: Positive for sensory change and focal weakness.  All other systems reviewed and are negative.   Objective: Vital Signs: Blood pressure 161/85, pulse 80, temperature 98.3 F (36.8 C), temperature source Oral, resp. rate 18, weight 94.8 kg (208 lb 15.9 oz), SpO2 97.00%. No results found. Results for orders placed during the hospital encounter of 08/19/12 (from the past 72 hour(s))  GLUCOSE, CAPILLARY     Status: Abnormal   Collection Time    08/19/12  4:31 PM      Result Value Range   Glucose-Capillary 105 (*) 70 - 99 mg/dL  CBC WITH DIFFERENTIAL     Status: Abnormal   Collection Time    08/19/12  6:45 PM      Result Value Range   WBC 10.8 (*) 4.0 - 10.5 K/uL   RBC 3.30 (*) 4.22 - 5.81 MIL/uL   Hemoglobin 9.9 (*) 13.0 - 17.0 g/dL   HCT 69.6 (*) 29.5 - 28.4 %   MCV 95.2  78.0 - 100.0 fL   MCH 30.0  26.0 - 34.0 pg   MCHC 31.5  30.0 - 36.0 g/dL   RDW 13.2  44.0 - 10.2 %   Platelets 352  150 - 400 K/uL   Neutrophils Relative % 65  43 - 77 %   Neutro Abs 7.1  1.7 - 7.7 K/uL   Lymphocytes Relative  21  12 - 46 %   Lymphs Abs 2.3  0.7 - 4.0 K/uL   Monocytes Relative 9  3 - 12 %   Monocytes Absolute 0.9  0.1 - 1.0 K/uL   Eosinophils Relative 4  0 - 5 %   Eosinophils Absolute 0.4  0.0 - 0.7 K/uL   Basophils Relative 1  0 - 1 %   Basophils Absolute 0.1  0.0 - 0.1 K/uL  COMPREHENSIVE METABOLIC PANEL     Status: Abnormal   Collection Time    08/19/12  6:45 PM      Result Value Range   Sodium 139  135 - 145 mEq/L   Potassium 4.2  3.5 - 5.1 mEq/L   Chloride 102  96 - 112 mEq/L   CO2 29  19 - 32 mEq/L   Glucose, Bld 180 (*) 70 - 99 mg/dL   BUN 24 (*) 6 - 23 mg/dL   Creatinine, Ser 7.25 (*) 0.50 - 1.35 mg/dL   Calcium 9.0  8.4 - 36.6 mg/dL   Total Protein 6.2  6.0 - 8.3 g/dL   Albumin 2.6 (*) 3.5 - 5.2 g/dL   AST 31  0 - 37 U/L   ALT 21  0 -  53 U/L   Alkaline Phosphatase 65  39 - 117 U/L   Total Bilirubin 0.4  0.3 - 1.2 mg/dL   GFR calc non Af Amer 39 (*) >90 mL/min   GFR calc Af Amer 45 (*) >90 mL/min   Comment:            The eGFR has been calculated     using the CKD EPI equation.     This calculation has not been     validated in all clinical     situations.     eGFR's persistently     <90 mL/min signify     possible Chronic Kidney Disease.  GLUCOSE, CAPILLARY     Status: Abnormal   Collection Time    08/19/12  9:13 PM      Result Value Range   Glucose-Capillary 163 (*) 70 - 99 mg/dL  GLUCOSE, CAPILLARY     Status: Abnormal   Collection Time    08/20/12  7:20 AM      Result Value Range   Glucose-Capillary 130 (*) 70 - 99 mg/dL  GLUCOSE, CAPILLARY     Status: Abnormal   Collection Time    08/20/12 11:33 AM      Result Value Range   Glucose-Capillary 107 (*) 70 - 99 mg/dL  GLUCOSE, CAPILLARY     Status: Abnormal   Collection Time    08/20/12  4:45 PM      Result Value Range   Glucose-Capillary 140 (*) 70 - 99 mg/dL  GLUCOSE, CAPILLARY     Status: Abnormal   Collection Time    08/20/12  8:31 PM      Result Value Range   Glucose-Capillary 138 (*) 70 - 99 mg/dL   GLUCOSE, CAPILLARY     Status: Abnormal   Collection Time    08/21/12  7:32 AM      Result Value Range   Glucose-Capillary 169 (*) 70 - 99 mg/dL     HEENT: normal and no facial droop Cardio: RRR Resp: CTA B/L GI: BS positive and non distended Extremity:  Pulses positive and Edema pretibial 1+ Skin:   Wound C/D/I Neuro: Alert/Oriented, Abnormal Sensory hypersensitive Right leg, Abnormal Motor 4/5 in Bilateral delt,bi,tri, grip,  and Tone:  3+ DTR on Right side Musc/Skel:  Normal and Extremity tender Right leg Gen NAD   Assessment/Plan: 1. Functional deficits secondary to deconditioning AAA R femoral entarterectomy 6/6/14which require 3+ hours per day of interdisciplinary therapy in a comprehensive inpatient rehab setting. Physiatrist is providing close team supervision and 24 hour management of active medical problems listed below. Physiatrist and rehab team continue to assess barriers to discharge/monitor patient progress toward functional and medical goals. FIM: FIM - Bathing Bathing Steps Patient Completed: Chest;Right Arm;Left Arm;Abdomen;Front perineal area;Buttocks;Right upper leg;Left upper leg;Right lower leg (including foot);Left lower leg (including foot) Bathing: 5: Set-up assist to: Obtain items  FIM - Upper Body Dressing/Undressing Upper body dressing/undressing steps patient completed: Thread/unthread right sleeve of pullover shirt/dresss;Thread/unthread left sleeve of pullover shirt/dress;Put head through opening of pull over shirt/dress;Pull shirt over trunk Upper body dressing/undressing: 5: Set-up assist to: Obtain clothing/put away FIM - Lower Body Dressing/Undressing Lower body dressing/undressing steps patient completed: Thread/unthread right underwear leg;Thread/unthread left underwear leg;Pull underwear up/down;Thread/unthread right pants leg;Thread/unthread left pants leg;Pull pants up/down;Don/Doff right sock;Don/Doff left sock;Don/Doff left shoe Lower body  dressing/undressing: 4: Min-Patient completed 75 plus % of tasks        FIM - Banker Devices:  Walker;Arm rests Bed/Chair Transfer: 4: Supine > Sit: Min A (steadying Pt. > 75%/lift 1 leg);4: Sit > Supine: Min A (steadying pt. > 75%/lift 1 leg);4: Bed > Chair or W/C: Min A (steadying Pt. > 75%);4: Chair or W/C > Bed: Min A (steadying Pt. > 75%)  FIM - Locomotion: Wheelchair Distance: 100 Locomotion: Wheelchair: 2: Travels 50 - 149 ft with supervision, cueing or coaxing FIM - Locomotion: Ambulation Locomotion: Ambulation Assistive Devices: Other (comment) (None) Ambulation/Gait Assistance: 3: Mod assist Locomotion: Ambulation: 1: Travels less than 50 ft with moderate assistance (Pt: 50 - 74%)  Comprehension Comprehension Mode: Auditory Comprehension: 7-Follows complex conversation/direction: With no assist  Expression Expression Mode: Verbal Expression: 7-Expresses complex ideas: With no assist  Social Interaction Social Interaction: 7-Interacts appropriately with others - No medications needed.  Problem Solving Problem Solving: 7-Solves complex problems: Recognizes & self-corrects  Memory Memory: 7-Complete Independence: No helper  Medical Problem List and Plan:  1. DVT Prophylaxis/Anticoagulation: Mechanical: Sequential compression devices, below knee Bilateral lower extremities  2. Neuropathy: Pain LE improving. Continue hydrocodone prn. Monitor symptoms with increase activity/ambulation.  3. Mood: Flat affect. Offer ego support. Will have LCSW follow for evaluation.  4. Neuropsych: This patient is capable of making decisions on his own behalf.  5. HTN/CHF: Will monitor with bid checks. On Prinivil. Coreg decreased due to bradycardia. On lasix for fluid overload with recent diuresis. Follow weights/ I's and O's.  6. CAD: Continue Lipitor, ASA and coreg.  7. ABLA: Will monitor H/H. Recheck in am.  8. CKD: Baseline BUN/Cr 31/1.75.  Encourage po intake. Continue Prinivil but monitor lytes intermittently.  9. Leucocytosis: Resolved. Will monitor wounds.  10. COPD: Respiratory status improved with diuresis. Continue to encourage IS. Continue nebs prn.  11. Constipation: . Increase Senna to bid. Sorbitol this morning. Patient refusing suppositories or enemas  LOS (Days) 2 A FACE TO FACE EVALUATION WAS PERFORMED  Kasheena Sambrano E 08/21/2012, 10:01 AM

## 2012-08-21 NOTE — Progress Notes (Signed)
Physical Therapy Session Note  Patient Details  Name: [name] [name] MRN: [id number] Date of Birth: 10-26-1931  Today's Date: 08/21/2012 Time: 1445-1530 Time Calculation (min): 45 min  Short Term Goals: Week 1:LTGs     Skilled Therapeutic Interventions/Progress Updates:  Treatment focused on activity tolerance, bed mobility, therapeutic exercise performed with bil LEs to increase strength for functional mobility, gait.  Gait in room with supervision, VCs for hand placement, x 120' x 2 to/from ADL apt.  Bed mobility on regular bed, trying to find method for sit>< supine that does not pull abdominal staples.  Pt is able to sit on foot of bed and scoot up to [facility] with supervision; this is easier than sitting on side and moving through sidelying.   Exs: hip flexion ext with flexed knees, in L sidelying; bil shoulder protraction, lower trunk rotation, bil bridging; calf raises, toe raises, mini squats and open chair hip abduction, in standing with mod assist for balance     Therapy Documentation Precautions:  Precautions Precautions: Fall Restrictions Weight Bearing Restrictions: No Pain: Pain Assessment Pain Assessment: No/denies pain  Locomotion : Ambulation Ambulation/Gait Assistance: 5: Supervision      See FIM for current functional status  Therapy/Group: Individual Therapy  [name] 08/21/2012, 3:45 PM

## 2012-08-21 NOTE — Patient Care Conference (Signed)
Inpatient RehabilitationTeam Conference and Plan of Care Update Date: 08/21/2012   Time: 11:30 Am    Patient Name: [name] [name]      Medical Record Number: [id number]  Date of Birth: Jul 29, 1931 Sex: Male         Room/Bed: 4147/4147-01 Payor Info: Payor: MEDICARE / Plan: MEDICARE PART A AND B / Product Type: *No Product type* /    Admitting Diagnosis: Decond  Admit Date/Time:  08/19/2012  4:06 PM Admission Comments: No comment available   Primary Diagnosis:  PAD (peripheral artery disease) Principal Problem: PAD (peripheral artery disease)  Patient Active Problem List   Diagnosis Date Noted  . PAD (peripheral artery disease)--bifemoral bypass graft. 08/20/2012  . Ischemic foot 08/20/2012  . Fluid overload 08/20/2012  . Heart block AV second degree - episodic 08/16/2012  . Peripheral vascular disease   . Smoker 08/15/2012  . COPD (chronic obstructive pulmonary disease) 08/15/2012  . Dyspnea 08/15/2012  . Preop cardiovascular exam 08/07/2012  . Abdominal aneurysm without mention of rupture 02/07/2012  . CAD (coronary artery disease)   . Neuropathy   . HTN (hypertension)   . Dyslipidemia   . CVA (cerebral vascular accident)   . AAA (abdominal aortic aneurysm)   . Renal artery stenosis   . Tobacco abuse   . Hx of CABG   . Ejection fraction     Expected Discharge Date: Expected Discharge Date: 08/23/12  Team Members Present: Physician leading conference: Dr. Claudette Laws Social Worker Present: Dossie Der, LCSW Nurse Present: Other (comment) Charisse March Hicks-RN) PT Present: Edman Circle, PT;Caroline Adriana Simas, PT;Other (comment) Clarisse Gouge Ripa-PT) OT Present: Other (comment);Leonette Monarch, Felipa Eth, OT (Kayla Perkinson-OT) SLP Present: Fae Pippin, SLP Other (Discipline and Name): marie Noel-PPS     Current Status/Progress Goal Weekly Team Focus  Medical   wound healing well, improving endurance  Mod I to Sup levels ADLa dn Mob  D/C planning   Bowel/Bladder   continent of bowel and bladder, uses urinal  continent of bowel and bladder with toileting      Swallow/Nutrition/ Hydration   Heart Healthy  755 of meals or better      ADL's   Supervision overall and Mod I for sit><stand to use the urinal  Overall Mod I with BADL & IADL  Activity tolerance, strengthening, patient & caregiver education   Mobility   Min A with gait, S with transfers  Mod I transfers, S gait, Min A stairs  activity tolerance, strengthening, gait training.    Communication   can communicate needs to staff  communicate effectively with staff and family      Safety/Cognition/ Behavioral Observations  no unsafe behaviors  no falls with injury      Pain   c/o right foot pain, oxycodone Ir 5-10 mg po every 4 hours prn  3 or less on scale of 1-10      Skin   incisions to bilateral groins and chest and sbdomen with staples-pink, open to air  no infection or breakdown         *See Care Plan and progress notes for long and short-term goals.  Barriers to Discharge: Still needs some assist for bathing    Possible Resolutions to Barriers:  family training    Discharge Planning/Teaching Needs:  Home with wife who can arrange someone to be with for a short time.  Doing well and wants to go home by Friday  diabeties management/ teaching blood pressure management/teaching   Team Discussion:  Pain better-wounds  look good.  No insulin needed upon discharge. Pt high level and making good progress  Revisions to Treatment Plan:  None   Continued Need for Acute Rehabilitation Level of Care: The patient requires daily medical management by a physician with specialized training in physical medicine and rehabilitation for the following conditions: Daily direction of a multidisciplinary physical rehabilitation program to ensure safe treatment while eliciting the highest outcome that is of practical value to the patient.: Yes Daily medical management of patient stability for increased  activity during participation in an intensive rehabilitation regime.: Yes Daily analysis of laboratory values and/or radiology reports with any subsequent need for medication adjustment of medical intervention for : Neurological problems  Lucy Chris 08/22/2012, 9:33 AM

## 2012-08-21 NOTE — Plan of Care (Signed)
Problem: RH BOWEL ELIMINATION Goal: RH STG MANAGE BOWEL W/MEDICATION W/ASSISTANCE STG Manage Bowel with Medication with minimal Assistance.  Outcome: Not Progressing Last BM 6-16. Stool softener given BID with sorbitol given this morning with no result thus far.

## 2012-08-21 NOTE — Progress Notes (Signed)
Removed pt staples from abdomen, left groin, and right groin with keeping two staples in place of right groin per Marissa Nestle, PA. Staples planned to be removed Friday prior to discharge. Pt incisions CDI with sites OTA.

## 2012-08-21 NOTE — Progress Notes (Signed)
Occupational Therapy Session Note  Patient Details  Name: GRANVILLE WHITEFIELD MRN: [id number] Date of Birth: Feb 17, 1932  Today's Date: 08/21/2012 Time: 0940-1010 Time Calculation (min): 30 min  Skilled Therapeutic Interventions/Progress Updates:    Patient seen this am for continuation of self care routine.  Patient unable to finish earlier secondary to discomfort / constipation.  Patient completed donned pants with set up assistance.  Patient used walker to walk in room and pick up laundry.  Patient safe with walker use for maneuvering around obstacles, for changing direction.  Patient able to free hands from walker for functional task in standing without difficulty.  Patient completed hygiene at sink in standing.  Patient with audible wheezing with increased activity, although 02 sats at 100%.  Patient indicates many years of smoking.  Patient with knee hyperextension in right leg with ambulation, he reports since stroke in 1997.    Therapy Documentation Precautions:  Precautions Precautions: Fall Restrictions Weight Bearing Restrictions: No Pain: Mild discomfort reported when repositioning right leg in bed.  See FIM for current functional status  Therapy/Group: Individual Therapy  Collier Salina 08/21/2012, 10:13 AM

## 2012-08-21 NOTE — Progress Notes (Signed)
Social Work Patient ID: [name] [name], male   DOB: 23-Aug-1931, 79 y.o.   MRN: [id number] Met with pt and spoke with wife via telephone to inform team conference goals-mod/i-supervision level and discharge 6/20. Both pleased with his progress, informed wife MD feels he will not go home on insulin.  Wife plans to come in Friday at 9:00 and attend Therapies with him to see his progress and address her concerns.

## 2012-08-21 NOTE — Progress Notes (Signed)
Patient ID: [name] [name], male   DOB: 03-25-1931, 79 y.o.   MRN: [id number] Comfortable.  Feet well perfused. Abd and groin wd healing.  DC staples in AM. Plan dc home in 2 days per CIR.  FU with me in 3-4 weeks

## 2012-08-21 NOTE — Progress Notes (Signed)
Occupational Therapy Session Notes  Patient Details  Name: [name] [name] MRN: [id number] Date of Birth: 1931-04-29  Today's Date: 08/21/2012 Time: 4098-1191 and 100-140 Time Calculation (min): 25 min and 40 min Missed 35 min due to pain, illness  Short Term Goals: Week 1:  OT Short Term Goal 1 (Week 1): STG=LTG secondary to ESLOS  Skilled Therapeutic Interventions/Progress Updates:  1)  Upon arrival, patient sitting EOB eating breakfast yet stating that he was not feeling well, initially declined to work on bath & dress session and wanted to lay back down.  Reminded patient that a set back of declining therapy might prolong his stay.  Patient then requested that I return about 30 min after he had a short rest.  Patient then chose to ambulate to sink and stand for most of sponge bath (declined shower).  Ace wraps applied to BLEs and patient assisted back to bed with BLEs elevated and pillow under calves.  Patient verbalized understanding that he would rest for ~30 minutes until next OT session when he would use his walker to put away dirty clothes, retreive clean clothes and dress.  This OT passed his room ~10 min later and patient was up in his room at the sink by himself and without the walker.  Patient stating that he had gone to the bathroom because he thought he needed to have a BM and was was getting dressed.  Reminded patient of our discussion just 10 minutes earlier regarding rest and wait for OT to dress and reminded him of our discussion yesterday, that when he gets out of bed without assistance we are to put his bed alarm on and a belt on him when he is in the chair.  Assisted patient back to bed and he agreed to call if he need to get up.  2)  Patient resting in bed with BLEs elevated and reporting that his RLE Ace bandage had been removed because his foot began to hurt and he has asked that the Ace stay off for now.  Patient ambulate with RW to therapy gym, practiced sit>side  lying>supine>side lying>sit technique to decrease strain on his abdomen and continues to require occasional vcs for technique and steps for successful pain free movement.  While in comfortable supine position on mat, patient engaged in BUE exercises with 5#bar for 3 sets of 10 chest presses and 3# dumbell for 3 sets of 10 bilateral tricep presses.  Back to his room via w/c then transfer to commode.  Patient to pull cord when he is finished.  Therapy Documentation Precautions:  Precautions Precautions: Fall Restrictions Weight Bearing Restrictions: No Pain: 1)  4/10 abdomen, rest breaks, repositioned, premedicated, RN aware 2)  2/10 right foot, rest breaks, repositioned, premedicated ADL: See FIM for current functional status  Therapy/Group: Individual Therapy  Devona Holmes 08/21/2012, 9:36 AM

## 2012-08-22 ENCOUNTER — Inpatient Hospital Stay (HOSPITAL_COMMUNITY): Payer: Medicare Other | Admitting: Physical Therapy

## 2012-08-22 ENCOUNTER — Inpatient Hospital Stay (HOSPITAL_COMMUNITY): Payer: Medicare Other | Admitting: Occupational Therapy

## 2012-08-22 ENCOUNTER — Other Ambulatory Visit: Payer: Self-pay

## 2012-08-22 DIAGNOSIS — I714 Abdominal aortic aneurysm, without rupture: Secondary | ICD-10-CM

## 2012-08-22 DIAGNOSIS — R5381 Other malaise: Secondary | ICD-10-CM

## 2012-08-22 DIAGNOSIS — I509 Heart failure, unspecified: Secondary | ICD-10-CM

## 2012-08-22 DIAGNOSIS — I739 Peripheral vascular disease, unspecified: Secondary | ICD-10-CM

## 2012-08-22 LAB — GLUCOSE, CAPILLARY
Glucose-Capillary: 123 mg/dL — ABNORMAL HIGH (ref 70–99)
Glucose-Capillary: 97 mg/dL (ref 70–99)
Glucose-Capillary: 105 mg/dL — ABNORMAL HIGH (ref 70–99)
Glucose-Capillary: 121 mg/dL — ABNORMAL HIGH (ref 70–99)

## 2012-08-22 NOTE — Progress Notes (Signed)
Physical Therapy Session Note  Patient Details  Name: [name] [name] MRN: [id number] Date of Birth: June 30, 1931  Today's Date: 08/22/2012 Time: Session #1:  0801-0905, Session #2: missed 30 mins Time Calculation (min): Session #1: 64 min, Session #2: 30 min missed  Short Term Goals: Week 1:   STGs=LTGs  Skilled Therapeutic Interventions/Progress Updates:    Session #1: This session focused on bed mobility with bed flat, no rails mod I, transfers bed to [facility] with RW mod I, gait with RW >150' indoor and household surfaces mod I, car transfer supervision, stairs mod I bil rails alternating pattern verbal cues for safer leg sequencing.  WC mobility > 150' mod I.  Pt reports right foot and groin pain due to stitches removed today.  More antalgic gait pattern.  Pt made independent in room with RW (RN/tech aware and discussed with OT).  HEP given and reviewed with pt for LE strengthening.   Session #2: This session pt found resting in bed visibly fatigued.  He reports he did not sleep much last night.   He would prefer to rest for a while.  I reminded him of his OT session this afternoon and he said he would be ready to get up then.    Therapy Documentation Precautions:  Precautions Precautions: Fall Restrictions Weight Bearing Restrictions: No  See FIM for current functional status  Therapy/Group: Individual Therapy  [name] [name], PT, DPT [phone]   08/22/2012, 11:02 AM

## 2012-08-22 NOTE — Progress Notes (Signed)
Patient ID: [name] [name], male   DOB: [birth year], 79 y.o.   MRN: [id number] Subjective/Complaints: 79 y.o. male with history of HTN, CVA, AAA, tobacco abuse, PAD with severe right foot pain with limb threatening ischemia. He was admitted on 08/09/12 for AA with extensive right femoral endarterectomy and resection of AAA by Dr. Arbie Cookey. Post op with abdominal pain due to ileus. NGT discontinued and diet initiated yesterday. He has had complaints of dyspnea with wheezing and was treated with dose of lasix. CXR without acute findings.  Lovenox discontinued due to downward trend of H/H. Had had edema RLE therefore doppler done today and were negative for DVT  Right sided weakness since stroke 1997 Abdominal pain.most of sutures removed yesterday  Review of Systems  Musculoskeletal: Negative for falls.  Neurological: Positive for sensory change and focal weakness.  All other systems reviewed and are negative.   Objective: Vital Signs: Blood pressure 123/68, pulse 75, temperature 97.6 F (36.4 C), temperature source Oral, resp. rate 16, weight 94.1 kg (207 lb 7.3 oz), SpO2 95.00%. No results found. Results for orders placed during the hospital encounter of 08/19/12 (from the past 72 hour(s))  GLUCOSE, CAPILLARY     Status: Abnormal   Collection Time    08/19/12  4:31 PM      Result Value Range   Glucose-Capillary 105 (*) 70 - 99 mg/dL  CBC WITH DIFFERENTIAL     Status: Abnormal   Collection Time    08/19/12  6:45 PM      Result Value Range   WBC 10.8 (*) 4.0 - 10.5 K/uL   RBC 3.30 (*) 4.22 - 5.81 MIL/uL   Hemoglobin 9.9 (*) 13.0 - 17.0 g/dL   HCT 40.9 (*) 81.1 - 91.4 %   MCV 95.2  78.0 - 100.0 fL   MCH 30.0  26.0 - 34.0 pg   MCHC 31.5  30.0 - 36.0 g/dL   RDW 78.2  95.6 - 21.3 %   Platelets 352  150 - 400 K/uL   Neutrophils Relative % 65  43 - 77 %   Neutro Abs 7.1  1.7 - 7.7 K/uL   Lymphocytes Relative 21  12 - 46 %   Lymphs Abs 2.3  0.7 - 4.0 K/uL   Monocytes Relative 9  3 - 12 %    Monocytes Absolute 0.9  0.1 - 1.0 K/uL   Eosinophils Relative 4  0 - 5 %   Eosinophils Absolute 0.4  0.0 - 0.7 K/uL   Basophils Relative 1  0 - 1 %   Basophils Absolute 0.1  0.0 - 0.1 K/uL  COMPREHENSIVE METABOLIC PANEL     Status: Abnormal   Collection Time    08/19/12  6:45 PM      Result Value Range   Sodium 139  135 - 145 mEq/L   Potassium 4.2  3.5 - 5.1 mEq/L   Chloride 102  96 - 112 mEq/L   CO2 29  19 - 32 mEq/L   Glucose, Bld 180 (*) 70 - 99 mg/dL   BUN 24 (*) 6 - 23 mg/dL   Creatinine, Ser 0.86 (*) 0.50 - 1.35 mg/dL   Calcium 9.0  8.4 - 57.8 mg/dL   Total Protein 6.2  6.0 - 8.3 g/dL   Albumin 2.6 (*) 3.5 - 5.2 g/dL   AST 31  0 - 37 U/L   ALT 21  0 - 53 U/L   Alkaline Phosphatase 65  39 - 117 U/L  Total Bilirubin 0.4  0.3 - 1.2 mg/dL   GFR calc non Af Amer 39 (*) >90 mL/min   GFR calc Af Amer 45 (*) >90 mL/min   Comment:            The eGFR has been calculated     using the CKD EPI equation.     This calculation has not been     validated in all clinical     situations.     eGFR's persistently     <90 mL/min signify     possible Chronic Kidney Disease.  GLUCOSE, CAPILLARY     Status: Abnormal   Collection Time    08/19/12  9:13 PM      Result Value Range   Glucose-Capillary 163 (*) 70 - 99 mg/dL  GLUCOSE, CAPILLARY     Status: Abnormal   Collection Time    08/20/12  7:20 AM      Result Value Range   Glucose-Capillary 130 (*) 70 - 99 mg/dL  GLUCOSE, CAPILLARY     Status: Abnormal   Collection Time    08/20/12 11:33 AM      Result Value Range   Glucose-Capillary 107 (*) 70 - 99 mg/dL  GLUCOSE, CAPILLARY     Status: Abnormal   Collection Time    08/20/12  4:45 PM      Result Value Range   Glucose-Capillary 140 (*) 70 - 99 mg/dL  GLUCOSE, CAPILLARY     Status: Abnormal   Collection Time    08/20/12  8:31 PM      Result Value Range   Glucose-Capillary 138 (*) 70 - 99 mg/dL  GLUCOSE, CAPILLARY     Status: Abnormal   Collection Time    08/21/12  7:32  AM      Result Value Range   Glucose-Capillary 169 (*) 70 - 99 mg/dL  GLUCOSE, CAPILLARY     Status: Abnormal   Collection Time    08/21/12 11:21 AM      Result Value Range   Glucose-Capillary 125 (*) 70 - 99 mg/dL  GLUCOSE, CAPILLARY     Status: None   Collection Time    08/21/12  5:23 PM      Result Value Range   Glucose-Capillary 96  70 - 99 mg/dL  GLUCOSE, CAPILLARY     Status: Abnormal   Collection Time    08/21/12  8:38 PM      Result Value Range   Glucose-Capillary 152 (*) 70 - 99 mg/dL  GLUCOSE, CAPILLARY     Status: Abnormal   Collection Time    08/22/12  7:21 AM      Result Value Range   Glucose-Capillary 123 (*) 70 - 99 mg/dL     HEENT: normal and no facial droop Cardio: RRR Resp: CTA B/L GI: BS positive and non distended Extremity:  Pulses positive and Edema pretibial 1+ Skin:   Wound C/D/I Neuro: Alert/Oriented, Abnormal Sensory hypersensitive Right leg, Abnormal Motor 4/5 in Bilateral delt,bi,tri, grip,  and Tone:  3+ DTR on Right side Musc/Skel:  Normal and Extremity tender Right leg Gen NAD   Assessment/Plan: 1. Functional deficits secondary to deconditioning AAA R femoral entarterectomy 6/6/14which require 3+ hours per day of interdisciplinary therapy in a comprehensive inpatient rehab setting. Physiatrist is providing close team supervision and 24 hour management of active medical problems listed below. Physiatrist and rehab team continue to assess barriers to discharge/monitor patient progress toward functional and medical goals. FIM: FIM -  Bathing Bathing Steps Patient Completed: Chest;Right Arm;Left Arm;Abdomen;Front perineal area;Buttocks;Right upper leg;Left upper leg;Right lower leg (including foot);Left lower leg (including foot) Bathing: 5: Set-up assist to: Obtain items  FIM - Upper Body Dressing/Undressing Upper body dressing/undressing steps patient completed: Thread/unthread right sleeve of pullover shirt/dresss;Thread/unthread left sleeve  of pullover shirt/dress;Put head through opening of pull over shirt/dress;Pull shirt over trunk Upper body dressing/undressing: 5: Set-up assist to: Obtain clothing/put away FIM - Lower Body Dressing/Undressing Lower body dressing/undressing steps patient completed: Thread/unthread right pants leg;Thread/unthread left pants leg;Pull pants up/down;Fasten/unfasten pants Lower body dressing/undressing: 4: Min-Patient completed 75 plus % of tasks  FIM - Toileting Toileting steps completed by patient: Adjust clothing prior to toileting;Performs perineal hygiene;Adjust clothing after toileting Toileting Assistive Devices: Grab bar or rail for support Toileting: 5: Supervision: Safety issues/verbal cues  FIM - Diplomatic Services operational officer Devices: Grab bars Toilet Transfers: 5-To toilet/BSC: Supervision (verbal cues/safety issues);5-From toilet/BSC: Supervision (verbal cues/safety issues)  FIM - Banker Devices: Therapist, occupational: 5: Chair or W/C > Bed: Supervision (verbal cues/safety issues);5: Bed > Chair or W/C: Supervision (verbal cues/safety issues)  FIM - Locomotion: Wheelchair Distance: 150 Locomotion: Wheelchair: 0: Activity did not occur FIM - Locomotion: Ambulation Locomotion: Ambulation Assistive Devices: Designer, industrial/product Ambulation/Gait Assistance: 6: Modified independent (Device/Increase time) Locomotion: Ambulation: 5: Travels 150 ft or more with supervision/safety issues  Comprehension Comprehension Mode: Auditory Comprehension: 6-Follows complex conversation/direction: With extra time/assistive device  Expression Expression Mode: Verbal Expression: 6-Expresses complex ideas: With extra time/assistive device  Social Interaction Social Interaction: 6-Interacts appropriately with others with medication or extra time (anti-anxiety, antidepressant).  Problem Solving Problem Solving: 6-Solves complex problems:  With extra time  Memory Memory: 6-More than reasonable amt of time  Medical Problem List and Plan:  1. DVT Prophylaxis/Anticoagulation: Mechanical: Sequential compression devices, below knee Bilateral lower extremities  2. Neuropathy: Pain LE improving. Continue hydrocodone prn. Monitor symptoms with increase activity/ambulation.  3. Mood: Flat affect. Offer ego support. Will have LCSW follow for evaluation.  4. Neuropsych: This patient is capable of making decisions on his own behalf.  5. HTN/CHF: Will monitor with bid checks. On Prinivil. Coreg decreased due to bradycardia. On lasix for fluid overload with recent diuresis. Follow weights/ I's and O's.  6. CAD: Continue Lipitor, ASA and coreg.  7. ABLA: Will monitor H/H. Recheck in am.  8. CKD: Baseline BUN/Cr 31/1.75. Encourage po intake. Continue Prinivil but monitor lytes intermittently.  9. Leucocytosis: Resolved. Will monitor wounds.  10. COPD: Respiratory status improved with diuresis. Continue to encourage IS. Continue nebs prn.  11. Constipation: . Increase Senna to bid. Sorbitol this morning. Patient refusing suppositories or enemas  LOS (Days) 3 A FACE TO FACE EVALUATION WAS PERFORMED  Brandii Lakey E 08/22/2012, 8:53 AM

## 2012-08-22 NOTE — Plan of Care (Signed)
Problem: RH SAFETY Goal: RH STG ADHERE TO SAFETY PRECAUTIONS W/ASSISTANCE/DEVICE STG Adhere to Safety Precautions With minimal Assistance/Device.  Outcome: Completed/Met Date Met:  08/22/12 Mod I in room

## 2012-08-22 NOTE — Social Work (Signed)
Lucy Chris, LCSW Social Worker Signed  Patient Care Conference Service date: 08/21/2012 3:30 PM  Inpatient RehabilitationTeam Conference and Plan of Care Update Date: 08/21/2012   Time: 11:30 Am     Patient Name: [name] [name]       Medical Record Number: [id number]   Date of Birth: February 05, 1932 Sex: Male         Room/Bed: 4147/4147-01 Payor Info: Payor: MEDICARE / Plan: MEDICARE PART A AND B / Product Type: *No Product type* /   Admitting Diagnosis: Decond   Admit Date/Time:  08/19/2012  4:06 PM Admission Comments: No comment available   Primary Diagnosis:  PAD (peripheral artery disease) Principal Problem: PAD (peripheral artery disease)    Patient Active Problem List     Diagnosis  Date Noted   .  PAD (peripheral artery disease)--bifemoral bypass graft.  08/20/2012   .  Ischemic foot  08/20/2012   .  Fluid overload  08/20/2012   .  Heart block AV second degree - episodic  08/16/2012   .  Peripheral vascular disease     .  Smoker  08/15/2012   .  COPD (chronic obstructive pulmonary disease)  08/15/2012   .  Dyspnea  08/15/2012   .  Preop cardiovascular exam  08/07/2012   .  Abdominal aneurysm without mention of rupture  02/07/2012   .  CAD (coronary artery disease)     .  Neuropathy     .  HTN (hypertension)     .  Dyslipidemia     .  CVA (cerebral vascular accident)     .  AAA (abdominal aortic aneurysm)     .  Renal artery stenosis     .  Tobacco abuse     .  Hx of CABG     .  Ejection fraction       Expected Discharge Date: Expected Discharge Date: 08/23/12  Team Members Present: Physician leading conference: Dr. Claudette Laws Social Worker Present: Dossie Der, LCSW Nurse Present: Other (comment) Charisse March Hicks-RN) PT Present: Edman Circle, PT;Caroline Adriana Simas, PT;Other (comment) Clarisse Gouge Ripa-PT) OT Present: Other (comment);Leonette Monarch, Felipa Eth, OT (Kayla Perkinson-OT) SLP Present: Fae Pippin, SLP Other (Discipline and Name): marie Noel-PPS         Current Status/Progress  Goal  Weekly Team Focus   Medical     wound healing well, improving endurance  Mod I to Sup levels ADLa dn Mob  D/C planning   Bowel/Bladder     continent of bowel and bladder, uses urinal  continent of bowel and bladder with toileting     Swallow/Nutrition/ Hydration     Heart Healthy  755 of meals or better     ADL's     Supervision overall and Mod I for sit><stand to use the urinal  Overall Mod I with BADL & IADL  Activity tolerance, strengthening, patient & caregiver education   Mobility     Min A with gait, S with transfers  Mod I transfers, S gait, Min A stairs  activity tolerance, strengthening, gait training.    Communication     can communicate needs to staff  communicate effectively with staff and family     Safety/Cognition/ Behavioral Observations    no unsafe behaviors  no falls with injury     Pain     c/o right foot pain, oxycodone Ir 5-10 mg po every 4 hours prn  3 or less on scale of 1-10     Skin  incisions to bilateral groins and chest and sbdomen with staples-pink, open to air  no infection or breakdown       *See Care Plan and progress notes for long and short-term goals.    Barriers to Discharge:  Still needs some assist for bathing      Possible Resolutions to Barriers:    family training      Discharge Planning/Teaching Needs:    Home with wife who can arrange someone to be with for a short time.  Doing well and wants to go home by Friday   diabeties management/ teaching blood pressure management/teaching    Team Discussion:    Pain better-wounds look good.  No insulin needed upon discharge. Pt high level and making good progress   Revisions to Treatment Plan:    None    Continued Need for Acute Rehabilitation Level of Care: The patient requires daily medical management by a physician with specialized training in physical medicine and rehabilitation for the following conditions: Daily direction of a  multidisciplinary physical rehabilitation program to ensure safe treatment while eliciting the highest outcome that is of practical value to the patient.: Yes Daily medical management of patient stability for increased activity during participation in an intensive rehabilitation regime.: Yes Daily analysis of laboratory values and/or radiology reports with any subsequent need for medication adjustment of medical intervention for : Neurological problems  Lucy Chris 08/22/2012, 9:33 AM

## 2012-08-22 NOTE — Plan of Care (Signed)
Problem: RH SAFETY Goal: RH STG DECREASED RISK OF FALL WITH ASSISTANCE STG Decreased Risk of Fall With minimal Assistance.  Outcome: Completed/Met Date Met:  08/22/12 Mod I in room

## 2012-08-22 NOTE — Discharge Summary (Signed)
Physician Discharge Summary  Patient ID: [name] [name] MRN: [id number] DOB/AGE: [birth year] 79 y.o.  Admit date: 08/19/2012 Discharge date: 08/23/2012  Discharge Diagnoses:  Principal Problem:   PAD (peripheral artery disease)--bifemoral bypass graft. Active Problems:   HTN (hypertension)   CVA (cerebral vascular accident)   AAA (abdominal aortic aneurysm)   CAD (coronary artery disease)   COPD (chronic obstructive pulmonary disease)   Ischemic foot   Fluid overload   Discharged Condition: Food.   Labs:  Basic Metabolic Panel:  Recent Labs Lab 08/18/12 0507 08/19/12 0455 08/19/12 1845  NA 139 139 139  K 3.8 4.1 4.2  CL 105 104 102  CO2 29 29 29   GLUCOSE 136* 111* 180*  BUN 27* 25* 24*  CREATININE 1.57* 1.52* 1.63*  CALCIUM 8.7 8.8 9.0    CBC:  Recent Labs Lab 08/19/12 0455 08/19/12 1845  WBC 10.6* 10.8*  NEUTROABS  --  7.1  HGB 9.5* 9.9*  HCT 29.5* 31.4*  MCV 93.4 95.2  PLT 334 352    CBG:  Recent Labs Lab 08/22/12 0721 08/22/12 1141 08/22/12 1632 08/22/12 2017 08/23/12 0732  GLUCAP 123* 97 105* 121* 125*    Brief HPI:   [name] [name] is a 79 y.o. male with history of HTN, CVA, AAA, tobacco abuse, PAD with severe right foot pain with limb threatening ischemia. He was admitted on 08/09/12 for AA with extensive right femoral endarterectomy and resection of AAA by Dr. Arbie Cookey. Post op has had issues with ileus, fluid overload as well as bradycardia. Coreg was discontinued by cardiology. Constipation has resolved and respiratory status improved with diuresis.  Therapies initiated and patient noted to be deconditioned. He was admitted to [facility] for progressive therapies.   Hospital Course: [name] [name] was admitted to rehab 08/19/2012 for inpatient therapies to consist of PT, ST and OT at least three hours five days a week. Past admission physiatrist, therapy team and rehab RN have worked together to provide customized collaborative inpatient rehab. His  wounds have healed well and staples were removed on 06/19 without difficulty. Pain in right foot had improved with incrase in perfusion. Blood pressures have been controlled and H/H has been stable with most recent check at 9.9/31.4. Labs show CKD at baseline. Constipation has resolved with setting of bowel program. He has made good progress during his stay and is at modified independent level.   Rehab course: During patient's stay in rehab weekly team conferences were held to monitor patient's progress, set goals and discuss barriers to discharge. Occupational therapy has focused ADL tasks. Patient is modified independent for bathing and dressing.  Wife was educated on fall risk precautions and safety in the home, as well as safety with stair management, equipment sizing for height, and basic cues for transfer training. He has had improved activity tolerance, improved balance, postural control, increased strength, decreased pain and ability to compensate for deficits. Pt continues to demonstrate decreased R foot clearance from residual CVA deficits, but is able to compensate. He is modified independent for dynamic standing balance and functional mobility in supervised setting. He is able to ambulate 150 feet with RW. Patient to discharged at an ambulating at supervision level.    Disposition: 01-Home or Self Care  Diet: Low fat, low cholesterol, Low salt  Wound Care: keep wound clean and dry. Wash with soap and water--pat dry and put dry dressing on right groin wound to prevent chapping/maceration.   Special Instructions: 1. Use oxycodone for pain management. Once  this is used up--you can go back to Hydrocodone.  Do not mix medications 2. Watch salt intake. 3. Wear protective shoes on feet when out of bed--to protect right toes. May need a size larger. 4. Do not use Coreg--need follow up with Dr. Myrtis Ser regarding need to resume medication.          Future Appointments Provider Department Dept  Phone   08/27/2012 11:30 AM Luis Abed, MD [facility] Main Office [place]) [phone]   09/03/2012 11:30 AM [facility] [phone]   09/03/2012 12:00 PM Larina Earthly, MD [facility] [phone]   09/09/2012 11:45 AM [facility] [phone]       Medication List    STOP taking these medications       carvedilol 6.25 MG tablet  Commonly known as:  COREG     chlorthalidone 25 MG tablet  Commonly known as:  HYGROTON     enoxaparin 30 MG/0.3ML injection  Commonly known as:  LOVENOX     HYDROcodone-acetaminophen 5-325 MG per tablet  Commonly known as:  NORCO/VICODIN      TAKE these medications       albuterol (5 MG/ML) 0.5% nebulizer solution  Commonly known as:  PROVENTIL  Take 0.5 mLs (2.5 mg total) by nebulization as needed for wheezing or shortness of breath.     aspirin EC 81 MG tablet  Take 162 mg by mouth daily.     atorvastatin 40 MG tablet  Commonly known as:  LIPITOR  Take 40 mg by mouth daily.     cholecalciferol 1000 UNITS tablet  Commonly known as:  VITAMIN D  Take 1,000 Units by mouth daily.     furosemide 20 MG tablet  Commonly known as:  LASIX  Take 1 tablet (20 mg total) by mouth daily with breakfast.     lisinopril 20 MG tablet  Commonly known as:  PRINIVIL,ZESTRIL  Take 1 tablet (20 mg total) by mouth daily.     mometasone-formoterol 100-5 MCG/ACT Aero  Commonly known as:  DULERA  Inhale 2 puffs into the lungs 2 (two) times daily.     oxyCODONE 5 MG immediate release tablet  Commonly known as:  Oxy IR/ROXICODONE  Take 1-2 tablets (5-10 mg total) by mouth every 6 (six) hours as needed.     senna-docusate 8.6-50 MG per tablet  Commonly known as:  Senokot-S  Take 2 tablets by mouth 2 (two) times daily. Over the counter medication for constipation     tolterodine 4 MG 24 hr capsule  Commonly known as:  DETROL LA  Take  4 mg by mouth every other day.     traMADol 50 MG tablet  Commonly known as:  ULTRAM  Take 50 mg by mouth every 6 (six) hours as needed for pain. For pain     vitamin B-12 100 MCG tablet  Commonly known as:  CYANOCOBALAMIN  Take 50 mcg by mouth daily.     zolpidem 10 MG tablet  Commonly known as:  AMBIEN  Take 10 mg by mouth at bedtime.       Follow-up Information   Follow up with EARLY, TODD, MD In 3 weeks. (office will arrange-sent)    Contact information:   [address] [place] [address] [phone]       Follow up with Erick Colace, MD On 09/09/2012. (Be there at 11:15 for 11:45 appointment)  Contact information:   [address] [place] [address] [phone]       Follow up with Pearson Grippe, MD On 08/26/2012. (APPT: 11:00 AM)    Contact information:   [address] [place] [address] [phone]       Follow up with Willa Rough, MD. Call today. (for appointment. )    Contact information:   [address] [place] [address] [phone]       Signed: Jacquelynn Cree 08/23/2012, 6:07 PM

## 2012-08-22 NOTE — Progress Notes (Signed)
Occupational Therapy Session Notes  Patient Details  Name: [name] [name] MRN: [id number] Date of Birth: March 23, 1931  Today's Date: 08/22/2012 Time: 0905-1000 and 155-215 Time Calculation (min): 55 min and 20 min Time missed due to pain 25 min  Short Term Goals: Week 1:  OT Short Term Goal 1 (Week 1): STG=LTG secondary to ESLOS  Skilled Therapeutic Interventions/Progress Updates:  1)  Self care retraining to include sponge bath, dress and groom.  Focused session on activity tolerance, walker safety, gather clothes and supplies to prepare for session and clean up after session to include straighten up his hospital room.   Declined shower and reports that he will not shower when he discharges.  Patient has been made Mod I in his room today with his walker however he often chooses to 'furniture walk' instead of using the walker stating, "I do this a home a lot".  Patient required several vcs to use the walker safely as he tends to put it aside when need to perform a task, tries to hold items in his hands while also guiding walker with his hands and using it to stabilize when reaching outside of his base of support.  Patient was given a walker bag to use to transport items and return demonstrated transporting items such as the urinal to bathroom to empty into the commode.  Patient performed walker safety with vc.  Will encourage wife to provide cues when needed.  2)  Patient in bed upon arrival stating that he had taken medication for his right foot and declined to participate at this time and asked that this OT return later.  Educated on and assisted patient with elevating his feet secondary to he reports heel is sore and feet were not floating with pillows under calves as formally educated.  Upon arrival, patient reluctant to get out of bed yet agreed to "walk a little and go to the kitchen".  Patient left in his room with all items within reach.  Therapy Documentation Precautions:   Precautions Precautions: Fall Restrictions Weight Bearing Restrictions: No Pain: 1)  4/10 in right foot, repositioned, rest, premedicated 2)  5/10 in right foot, repositioned, rest, premedicated ADL: See FIM for current functional status  Therapy/Group: Individual Therapy for both sessions  Leaner [name] 08/22/2012, 4:21 PM

## 2012-08-23 ENCOUNTER — Encounter (HOSPITAL_COMMUNITY): Payer: Medicare Other | Admitting: Occupational Therapy

## 2012-08-23 ENCOUNTER — Ambulatory Visit (HOSPITAL_COMMUNITY): Payer: Medicare Other | Admitting: *Deleted

## 2012-08-23 MED ORDER — OXYCODONE HCL 5 MG PO TABS
5.0000 mg | ORAL_TABLET | Freq: Four times a day (QID) | ORAL | Status: DC | PRN
Start: 2012-08-23 — End: 2013-04-08

## 2012-08-23 MED ORDER — SENNOSIDES-DOCUSATE SODIUM 8.6-50 MG PO TABS: 2.0000 | ORAL_TABLET | Freq: Two times a day (BID) | ORAL | Status: DC

## 2012-08-23 NOTE — Progress Notes (Signed)
Social Work Discharge Note Discharge Note  The overall goal for the admission was met for:   Discharge location: Yes-HOME WITH WIFE-THERE WITH FEW DAYS THEN IN THE EVENINGS  Length of Stay: Yes-4 DAYS  Discharge activity level: Yes-MOD/I LEVEL  Home/community participation: Yes  Services provided included: MD, RD, PT, OT, RN, Pharmacy and SW  Financial Services: Medicare and Private Insurance: FED BCBS  Follow-up services arranged: Home Health: [facility] and Patient/Family has no preference for HH/DME agencies  Comments (or additional information):FAMILY EDUCATION COMPLETED TODAY AND BOTH FEEL COMFORTABLE WITH DISCHARGE. AWARE OF SMOKING CESSATION RESOURCES  Patient/Family verbalized understanding of follow-up arrangements: Yes  Individual responsible for coordination of the follow-up plan: SELF & NANCY-WIFE  Confirmed correct DME delivered: Lucy Chris 08/23/2012    Lucy Chris

## 2012-08-23 NOTE — Progress Notes (Signed)
Physical Therapy Discharge Summary  Patient Details  Name: [name] [name] MRN: [id number] Date of Birth: Dec 17, 1931  Today's Date: 08/23/2012 Time: 10:00-10:33 ( )   Patient has met 8 of 8 long term goals due to improved activity tolerance, improved balance, increased strength, decreased pain and ability to compensate for deficits.  Patient to discharge at an ambulatory level Supervision.   Patient's care partner is independent to provide the necessary cognitive assistance at discharge.  Recommendation:  Patient will benefit from ongoing skilled PT services in home health setting to continue to advance safe functional mobility, address ongoing impairments in strength, balance, activity tolerance, neuromuscular re-education, and minimize fall risk.  Equipment: No equipment provided  Reasons for discharge: treatment goals met  Patient/family agrees with progress made and goals achieved: Yes  Wife and son present for family ed. Educated wife on falls risk precautions and safety in the home, as well as safety with stair management, equipment sizing for height, and basic cues for transfer training. Performed car transfer with S with cues for safe hand placement. Gait training in home environment around obstacles and tight turns with RW x75' with S. Pt continues to demonstrate decreased R foot clearance from residual CVA deficits, but is able to compensate. Pt demonstrates Mod I with dynamic standing balance during functional mobility in the room. Wife verbalized understanding and is pleased with pt's progress. They report no further concerns and are eager to DC home today.   PT Discharge Precautions/Restrictions Precautions Precautions: Fall Restrictions Weight Bearing Restrictions: No Vital Signs Therapy Vitals Pulse Rate: 85 Oxygen Therapy SpO2: 98 % O2 Device: None (Room air) Pain Pain Assessment Pain Assessment: 0-10 Pain Score: 0-No pain Patients Stated Pain Goal:  3 Multiple Pain Sites: No Vision/Perception  Vision - History Baseline Vision: Wears glasses all the time Patient Visual Report: No change from baseline  Cognition Overall Cognitive Status: Within Functional Limits for tasks assessed Arousal/Alertness: Awake/alert Orientation Level: Oriented X4 Sensation Sensation Light Touch: Impaired Detail Light Touch Impaired Details: Impaired RLE Additional Comments: Pt reports improved/normailzing sensation in RLE. Coordination Gross Motor Movements are Fluid and Coordinated: No Fine Motor Movements are Fluid and Coordinated: No Coordination and Movement Description: Coordination mostly unchanged since eval due to residual deficits from old CVA, but pt demonstrates improved ability to compensate from deficits with increased activity tolerance at this time.  Motor  Motor Motor: Within Functional Limits;Hemiplegia Motor - Skilled Clinical Observations: Residual from CVA in 1997 most apparent in RLE Motor - Discharge Observations: Pt still with RLE weakness, but able to compensate more efffectively at this time during gait and mobility compared to eval  Mobility Bed Mobility Bed Mobility: Supine to Sit;Sit to Supine;Sitting - Scoot to Edge of Bed Supine to Sit: 6: Modified independent (Device/Increase time) Sitting - Scoot to Edge of Bed: 6: Modified independent (Device/Increase time) Sit to Supine: 6: Modified independent (Device/Increase time) Transfers Sit to Stand: 6: Modified independent (Device/Increase time) Sit to Stand Details (indicate cue type and reason): No assist needed Stand to Sit: 6: Modified independent (Device/Increase time) Stand to Sit Details: Safe hand placement noted Locomotion  Ambulation Ambulation: Yes Ambulation/Gait Assistance: 6: Modified independent (Device/Increase time) Ambulation Distance (Feet): 150 Feet Assistive device: Rolling walker Ambulation/Gait Assistance Details: Pt demonstrates safe RW  management in controlled and home environments. Wife education on how to adjust height of his RW at home if needed. Stairs / Additional Locomotion Stairs: Yes Stairs Assistance: 5: Supervision Stairs Assistance Details (indicate cue type and reason):  Pt ascend/descends stairs fairly esily, with only suggestion for step-to rather than alternating pattern for increased ease and safety.  Stair Management Technique: Two rails;One rail Left;Step to pattern Number of Stairs: 12 Height of Stairs: 6 Wheelchair Mobility Wheelchair Mobility: No Wheelchair Assistance: Not tested (comment) (Pt ambulatory)  Trunk/Postural Assessment  Cervical Assessment Cervical Assessment: Within Functional Limits Thoracic Assessment Thoracic Assessment: Within Functional Limits Lumbar Assessment Lumbar Assessment: Exceptions to [facility] (posterior pelvic tilt) Postural Control Postural Control: Within Functional Limits  Balance Balance Balance Assessed: Yes Dynamic Sitting Balance Dynamic Sitting - Balance Support: No upper extremity supported;Feet supported Dynamic Sitting - Level of Assistance: 6: Modified independent (Device/Increase time) Static Standing Balance Static Standing - Balance Support: Bilateral upper extremity supported Static Standing - Level of Assistance: 6: Modified independent (Device/Increase time) Dynamic Standing Balance Dynamic Standing - Balance Support: Right upper extremity supported;During functional activity Dynamic Standing - Level of Assistance: 6: Modified independent (Device/Increase time) Dynamic Standing - Balance Activities: Lateral lean/weight shifting;Ball toss;Reaching across midline Extremity Assessment      RLE Assessment RLE Assessment: Exceptions to [facility] RLE Strength RLE Overall Strength Comments: deficits due to pain: ankle 3-/5, knee 3/5, hip 3-/5.   LLE Assessment LLE Assessment: Within Functional Limits LLE Strength LLE Overall Strength Comments: 5/5  throguhout  See FIM for current functional status  Clydene Laming, PT, DPT  08/23/2012, 11:01 AM

## 2012-08-23 NOTE — Plan of Care (Signed)
Problem: RH KNOWLEDGE DEFICIT Goal: RH STG INCREASE KNOWLEDGE OF DIABETES Patient will be able to verbalizes signs and symptoms of hyperglycemia and hypoglycemia and be able to manage diabetes @ home independently (diet, meds, checking blood sugar ) and when to notify MD.  Outcome: Not Applicable Date Met:  08/23/12 Per patient and wife, not on any medications nor were they checking blood sugars at home, on SSI only here in hospital,not diabetic

## 2012-08-23 NOTE — Progress Notes (Signed)
Occupational Therapy Discharge Summary  Patient Details  Name: [name] [name] MRN: [id number] Date of Birth: 1931-08-16  Today's Date: 08/23/2012 Time: 4098-1191 Time Calculation (min): 45 min  Patient resting in bed with wife at his bedside.  Patient reports he already took a sponge bath and dressed and reports no problems.  Reviewed with wife patient's progress, walker safety, elevate BLEs to reduce edema, and bed mobility techniques to reduce strain on abdomen.  Patient ambulated with RW to therapy apartment yet declined to perform any kitchen task for wife to observe.  Reviewed option of tub bench however, patient reports that he will just sponge bathe.    Patient has met 9 of 9 long term goals due to improved activity tolerance, improved balance and reduced pain.  Patient to discharge at overall Modified Independent level.  Patient's care partner is independent to provide the necessary occasional verbal cues and support/encouragement assistance at discharge.    Reasons goals not met: n/a secondary to all goals met   Recommendation:  No further OT recommended at this time  Equipment: recommend tub bench if/when patient decides to shower in the long term  Reasons for discharge: treatment goals met and discharge from hospital  Patient/family agrees with progress made and goals achieved: Yes  OT Discharge Precautions/Restrictions  Precautions Precautions: Fall Restrictions Weight Bearing Restrictions: No Pain 4/10 RLE pain, premedicated, rest. ADL Please refer to FIM for details.  Patient is overall Mod I for BADL, toilet transfer and simple HM tasks with RW. Vision/Perception  Vision - History Baseline Vision: Wears glasses all the time Patient Visual Report: No change from baseline  Cognition Overall Cognitive Status: Within Functional Limits for tasks assessed Arousal/Alertness: Awake/alert Orientation Level: Oriented X4 Sensation Sensation Light Touch: Appears Intact  (BUEs) Hot/Cold: Appears Intact (BUEs) Coordination Gross Motor Movements are Fluid and Coordinated: No Fine Motor Movements are Fluid and Coordinated: No Coordination and Movement Description: RUE decreased rhythm, accuracy and speed with rapid alternating movements, isolated finger movements.  Patient reports this is probably residual from CVA in 1997. Motor  Motor Motor: Within Functional Limits;Hemiplegia Motor - Skilled Clinical Observations: Residual from CVA in 1997 most apparent in RLE Motor - Discharge Observations: Pt still with RLE weakness, but able to compensate more efffectively at this time during gait and mobility compared to eval Mobility  Bed Mobility Bed Mobility: Supine to Sit;Sitting - Scoot to [place] of Bed;Sit to Supine Supine to Sit: 6: Modified independent (Device/Increase time) Sitting - Scoot to Edge of Bed: 6: Modified independent (Device/Increase time) Sit to Supine: 6: Modified independent (Device/Increase time) Transfers Sit to Stand: 6: Modified independent (Device/Increase time) Stand to Sit: 6: Modified independent (Device/Increase time)  Trunk/Postural Assessment  Cervical Assessment Cervical Assessment: Within Functional Limits Thoracic Assessment Thoracic Assessment: Within Functional Limits Lumbar Assessment Lumbar Assessment: Exceptions to [facility] (posterior pelvic tilt) Postural Control Postural Control: Within Functional Limits  Balance Dynamic Sitting Balance Dynamic Sitting - Balance Support: No upper extremity supported;Feet supported Dynamic Sitting - Level of Assistance: 6: Modified independent (Device/Increase time) Static Standing Balance Static Standing - Balance Support: Bilateral upper extremity supported Static Standing - Level of Assistance: 6: Modified independent (Device/Increase time) Dynamic Standing Balance Dynamic Standing - Balance Support: During functional activity;Right upper extremity supported;Left upper extremity  supported (at least one upper extremity supported ) Dynamic Standing - Level of Assistance: 6: Modified independent (Device/Increase time) Extremity/Trunk Assessment RUE Assessment RUE Assessment: Exceptions to [facility] RUE AROM (degrees) RUE Overall AROM Comments: WFL except end  ranges of shoulder movements RUE Strength RUE Overall Strength Comments: Unable to fully asses due to reports of abdominal pain (surgery site).  Grossly WFL during BADL LUE Assessment LUE Assessment: Exceptions to [facility] LUE Strength LUE Overall Strength Comments: Unable to full assess strength due to reports of abdominal pain (surgery site).  Grossly WFL during BADL  See FIM for current functional status  Keiandra Sullenger 08/23/2012, 2:33 PM

## 2012-08-23 NOTE — Progress Notes (Signed)
Patient ID: [name] [name], male   DOB: 1931-11-04, 79 y.o.   MRN: [id number] Subjective/Complaints: 79 y.o. male with history of HTN, CVA, AAA, tobacco abuse, PAD with severe right foot pain with limb threatening ischemia. He was admitted on 08/09/12 for AA with extensive right femoral endarterectomy and resection of AAA by Dr. Arbie Cookey. Post op with abdominal pain due to ileus. NGT discontinued and diet initiated yesterday. He has had complaints of dyspnea with wheezing and was treated with dose of lasix. CXR without acute findings.  Lovenox discontinued due to downward trend of H/H. Had had edema RLE therefore doppler done today and were negative for DVT  2 groin sutures retained  Review of Systems  Musculoskeletal: Negative for falls.  Neurological: Positive for sensory change and focal weakness.  All other systems reviewed and are negative.   Objective: Vital Signs: Blood pressure 116/76, pulse 72, temperature 98.4 F (36.9 C), temperature source Oral, resp. rate 19, weight 93.6 kg (206 lb 5.6 oz), SpO2 91.00%. No results found. Results for orders placed during the hospital encounter of 08/19/12 (from the past 72 hour(s))  GLUCOSE, CAPILLARY     Status: Abnormal   Collection Time    08/20/12 11:33 AM      Result Value Range   Glucose-Capillary 107 (*) 70 - 99 mg/dL  GLUCOSE, CAPILLARY     Status: Abnormal   Collection Time    08/20/12  4:45 PM      Result Value Range   Glucose-Capillary 140 (*) 70 - 99 mg/dL  GLUCOSE, CAPILLARY     Status: Abnormal   Collection Time    08/20/12  8:31 PM      Result Value Range   Glucose-Capillary 138 (*) 70 - 99 mg/dL  GLUCOSE, CAPILLARY     Status: Abnormal   Collection Time    08/21/12  7:32 AM      Result Value Range   Glucose-Capillary 169 (*) 70 - 99 mg/dL  GLUCOSE, CAPILLARY     Status: Abnormal   Collection Time    08/21/12 11:21 AM      Result Value Range   Glucose-Capillary 125 (*) 70 - 99 mg/dL  GLUCOSE, CAPILLARY     Status:  None   Collection Time    08/21/12  5:23 PM      Result Value Range   Glucose-Capillary 96  70 - 99 mg/dL  GLUCOSE, CAPILLARY     Status: Abnormal   Collection Time    08/21/12  8:38 PM      Result Value Range   Glucose-Capillary 152 (*) 70 - 99 mg/dL  GLUCOSE, CAPILLARY     Status: Abnormal   Collection Time    08/22/12  7:21 AM      Result Value Range   Glucose-Capillary 123 (*) 70 - 99 mg/dL  GLUCOSE, CAPILLARY     Status: None   Collection Time    08/22/12 11:41 AM      Result Value Range   Glucose-Capillary 97  70 - 99 mg/dL  GLUCOSE, CAPILLARY     Status: Abnormal   Collection Time    08/22/12  4:32 PM      Result Value Range   Glucose-Capillary 105 (*) 70 - 99 mg/dL   Comment 1 Notify RN    GLUCOSE, CAPILLARY     Status: Abnormal   Collection Time    08/22/12  8:17 PM      Result Value Range   Glucose-Capillary 121 (*)  70 - 99 mg/dL   Comment 1 Notify RN    GLUCOSE, CAPILLARY     Status: Abnormal   Collection Time    08/23/12  7:32 AM      Result Value Range   Glucose-Capillary 125 (*) 70 - 99 mg/dL     HEENT: normal and no facial droop Cardio: RRR Resp: CTA B/L GI: BS positive and non distended Extremity:  Pulses positive and Edema pretibial 1+ Skin:   Wound C/D/I Neuro: Alert/Oriented, Abnormal Sensory hypersensitive Right leg, Abnormal Motor 4/5 in Bilateral delt,bi,tri, grip,  and Tone:  3+ DTR on Right side Musc/Skel:  Normal and Extremity tender Right leg Gen NAD   Assessment/Plan: 1. Functional deficits secondary to deconditioning AAA R femoral entarterectomy 6/6/14which require 3+ hours per day of interdisciplinary therapy in a comprehensive inpatient rehab setting. Stable for D/C today F/u PCP in 1-2 weeks F/u PM&R 3 weeks See D/C summary See D/C instructions FIM - Bathing Bathing Steps Patient Completed: Chest;Right Arm;Left Arm;Abdomen;Front perineal area;Buttocks;Right upper leg;Left upper leg;Right lower leg (including foot);Left lower leg  (including foot) Bathing: 5: Set-up assist to: Obtain items  FIM - Upper Body Dressing/Undressing Upper body dressing/undressing steps patient completed: Thread/unthread right sleeve of pullover shirt/dresss;Thread/unthread left sleeve of pullover shirt/dress;Put head through opening of pull over shirt/dress;Pull shirt over trunk Upper body dressing/undressing: 5: Set-up assist to: Obtain clothing/put away FIM - Lower Body Dressing/Undressing Lower body dressing/undressing steps patient completed: Thread/unthread right pants leg;Thread/unthread left pants leg;Pull pants up/down;Fasten/unfasten pants Lower body dressing/undressing: 4: Min-Patient completed 75 plus % of tasks  FIM - Toileting Toileting steps completed by patient: Adjust clothing prior to toileting;Performs perineal hygiene;Adjust clothing after toileting Toileting Assistive Devices: Grab bar or rail for support Toileting: 7: Independent: No helper, no device  FIM - Diplomatic Services operational officer Devices: Grab bars Toilet Transfers: 5-To toilet/BSC: Supervision (verbal cues/safety issues);5-From toilet/BSC: Supervision (verbal cues/safety issues)  FIM - Banker Devices: Therapist, occupational: 6: Supine > Sit: No assist;6: Sit > Supine: No assist;6: Chair or W/C > Bed: No assist;6: Bed > Chair or W/C: No assist  FIM - Locomotion: Wheelchair Distance: 150 Locomotion: Wheelchair: 6: Travels 150 ft or more, turns around, maneuvers to table, bed or toilet, negotiates 3% grade: maneuvers on rugs and over door sills independently FIM - Locomotion: Ambulation Locomotion: Ambulation Assistive Devices: Designer, industrial/product Ambulation/Gait Assistance: 6: Modified independent (Device/Increase time) Locomotion: Ambulation: 6: Travels 150 ft or more with assistive device/no helper  Comprehension Comprehension Mode: Auditory Comprehension: 7-Follows complex conversation/direction:  With no assist  Expression Expression Mode: Verbal Expression: 7-Expresses complex ideas: With no assist  Social Interaction Social Interaction: 7-Interacts appropriately with others - No medications needed.  Problem Solving Problem Solving: 7-Solves complex problems: Recognizes & self-corrects  Memory Memory: 7-Complete Independence: No helper  Medical Problem List and Plan:  1. DVT Prophylaxis/Anticoagulation: Mechanical: Sequential compression devices, below knee Bilateral lower extremities  2. Neuropathy: Pain LE improving. Continue hydrocodone prn. Monitor symptoms with increase activity/ambulation.  3. Mood: Flat affect. Offer ego support. Will have LCSW follow for evaluation.  4. Neuropsych: This patient is capable of making decisions on his own behalf.  5. HTN/CHF: Will monitor with bid checks. On Prinivil. Coreg decreased due to bradycardia. On lasix for fluid overload with recent diuresis. Follow weights/ I's and O's.  6. CAD: Continue Lipitor, ASA and coreg.  7. ABLA: Will monitor H/H. Recheck in am.  8. CKD: Baseline BUN/Cr 31/1.75. Encourage po  intake. Continue Prinivil but monitor lytes intermittently.  9. Leucocytosis: Resolved. Will monitor wounds.  10. COPD: Respiratory status improved with diuresis. Continue to encourage IS. Continue nebs prn.  11. Constipation: . Increase Senna to bid. Sorbitol this morning. Patient refusing suppositories or enemas  LOS (Days) 4 A FACE TO FACE EVALUATION WAS PERFORMED  KIRSTEINS,ANDREW E 08/23/2012, 8:46 AM

## 2012-08-23 NOTE — Progress Notes (Signed)
Patient and wife received verbal and written discharge instructions from Marissa Nestle, [place]. Reviewed medications and follow up appointments and general  incision care, incision care to right groin and the application of antifungal powder to right inner thigh/groin for yeast, patient and wife deny questions/concerns. Personal belongings packed by wife. Patient taken down by NT via wheelchair. Roberts-VonCannon, Bartley Vuolo Elon Jester

## 2012-08-24 ENCOUNTER — Encounter: Payer: Self-pay | Admitting: Cardiology

## 2012-08-24 DIAGNOSIS — I2510 Atherosclerotic heart disease of native coronary artery without angina pectoris: Secondary | ICD-10-CM | POA: Diagnosis not present

## 2012-08-24 DIAGNOSIS — I69959 Hemiplegia and hemiparesis following unspecified cerebrovascular disease affecting unspecified side: Secondary | ICD-10-CM | POA: Diagnosis not present

## 2012-08-24 DIAGNOSIS — Z48812 Encounter for surgical aftercare following surgery on the circulatory system: Secondary | ICD-10-CM | POA: Diagnosis not present

## 2012-08-24 DIAGNOSIS — I10 Essential (primary) hypertension: Secondary | ICD-10-CM | POA: Diagnosis not present

## 2012-08-26 ENCOUNTER — Other Ambulatory Visit: Payer: Self-pay | Admitting: *Deleted

## 2012-08-26 DIAGNOSIS — I739 Peripheral vascular disease, unspecified: Secondary | ICD-10-CM

## 2012-08-26 DIAGNOSIS — E119 Type 2 diabetes mellitus without complications: Secondary | ICD-10-CM | POA: Diagnosis not present

## 2012-08-26 DIAGNOSIS — I10 Essential (primary) hypertension: Secondary | ICD-10-CM | POA: Diagnosis not present

## 2012-08-26 DIAGNOSIS — E7800 Pure hypercholesterolemia, unspecified: Secondary | ICD-10-CM | POA: Diagnosis not present

## 2012-08-26 DIAGNOSIS — Z48812 Encounter for surgical aftercare following surgery on the circulatory system: Secondary | ICD-10-CM

## 2012-08-27 ENCOUNTER — Ambulatory Visit (INDEPENDENT_AMBULATORY_CARE_PROVIDER_SITE_OTHER): Payer: Medicare Other | Admitting: Cardiology

## 2012-08-27 ENCOUNTER — Encounter: Payer: Self-pay | Admitting: Cardiology

## 2012-08-27 VITALS — BP 124/58 | HR 85 | Ht 70.0 in | Wt 200.8 lb

## 2012-08-27 DIAGNOSIS — I714 Abdominal aortic aneurysm, without rupture, unspecified: Secondary | ICD-10-CM

## 2012-08-27 DIAGNOSIS — Z720 Tobacco use: Secondary | ICD-10-CM

## 2012-08-27 DIAGNOSIS — I2510 Atherosclerotic heart disease of native coronary artery without angina pectoris: Secondary | ICD-10-CM

## 2012-08-27 DIAGNOSIS — I10 Essential (primary) hypertension: Secondary | ICD-10-CM

## 2012-08-27 DIAGNOSIS — F17200 Nicotine dependence, unspecified, uncomplicated: Secondary | ICD-10-CM

## 2012-08-27 NOTE — Progress Notes (Signed)
HPI  Patient is here to followup coronary disease. He is also here for post hospital visit. It became clear that he needed an abdominal aortic aneurysm repair. This was done on August 17, 2012. He was cleared by our team when he was in the hospital. We knew that his nuclear study in 2013 revealed no marked ischemia. His CABG was done in 2005. His ejection fraction is normal. I have reviewed the hospital records extensively.  He tolerated the surgery. He is weak but he is doing well. He is also seen Dr. Selena Batten his primary physician yesterday. His diastolic pressure was low but overall he was stable. There was also question about the use of aspirin or Plavix.  No Known Allergies  Current Outpatient Prescriptions  Medication Sig Dispense Refill  . aspirin EC 81 MG tablet Take 162 mg by mouth daily.       Marland Kitchen atorvastatin (LIPITOR) 40 MG tablet Take 40 mg by mouth daily.       . cholecalciferol (VITAMIN D) 1000 UNITS tablet Take 1,000 Units by mouth daily.       . Cyanocobalamin (VITAMIN B-12 PO) Take by mouth.      . furosemide (LASIX) 20 MG tablet Take 10 mg by mouth daily with breakfast.      . lisinopril (PRINIVIL,ZESTRIL) 20 MG tablet Take 1 tablet (20 mg total) by mouth daily.      . mometasone-formoterol (DULERA) 100-5 MCG/ACT AERO Inhale 2 puffs into the lungs 2 (two) times daily.      Marland Kitchen oxybutynin (DITROPAN-XL) 5 MG 24 hr tablet       . oxyCODONE (OXY IR/ROXICODONE) 5 MG immediate release tablet Take 1-2 tablets (5-10 mg total) by mouth every 6 (six) hours as needed.  60 tablet  0  . pioglitazone (ACTOS) 15 MG tablet Take 15 mg by mouth daily.      Marland Kitchen senna-docusate (SENOKOT-S) 8.6-50 MG per tablet Take 2 tablets by mouth 2 (two) times daily. Over the counter medication for constipation      . traMADol (ULTRAM) 50 MG tablet Take 50 mg by mouth every 6 (six) hours as needed for pain. For pain      . vitamin B-12 (CYANOCOBALAMIN) 100 MCG tablet Take 50 mcg by mouth daily.       Marland Kitchen zolpidem  (AMBIEN) 10 MG tablet Take 10 mg by mouth at bedtime.        No current facility-administered medications for this visit.    History   Social History  . Marital Status: Married    Spouse Name: N/A    Number of Children: N/A  . Years of Education: N/A   Occupational History  . retired    Social History Main Topics  . Smoking status: Former Smoker -- 0.50 packs/day for 50 years    Types: Cigarettes  . Smokeless tobacco: Not on file  . Alcohol Use: Yes     Comment: occasionally  . Drug Use: No  . Sexually Active: Not on file   Other Topics Concern  . Not on file   Social History Narrative  . No narrative on file    Family History  Problem Relation Age of Onset  . Tuberculosis Mother   . Coronary artery disease Father     Past Medical History  Diagnosis Date  . HTN (hypertension)   . Dyslipidemia   . CVA (cerebral vascular accident)   . AAA (abdominal aortic aneurysm)     Surgical repair June, 2014  .  Renal artery stenosis   . Tobacco abuse   . CAD (coronary artery disease)   . Hx of CABG     2005  . Ejection fraction   . Neuropathy     Right foot  . Leg pain   . Abdominal pain   . Hyperlipidemia   . Nervousness(799.21)   . Peripheral vascular disease   . Carotid artery occlusion   . COPD (chronic obstructive pulmonary disease) 08/15/2012  . Status post abdominal aortic aneurysm repair     June, 2014    Past Surgical History  Procedure Laterality Date  . Coronary artery bypass graft  2005    x4  . Spine surgery  1977    Lumbar HNP surgery  . Aorta - bilateral femoral artery bypass graft N/A 08/09/2012    Procedure: AORTA BIFEMORAL BYPASS GRAFT;  Surgeon: Larina Earthly, MD;  Location: [facility] OR;  Service: Vascular;  Laterality: N/A;    Patient Active Problem List   Diagnosis Date Noted  . PAD (peripheral artery disease)--bifemoral bypass graft. 08/20/2012  . Fluid overload 08/20/2012  . Heart block AV second degree - episodic 08/16/2012  . COPD  (chronic obstructive pulmonary disease) 08/15/2012  . Dyspnea 08/15/2012  . Abdominal aneurysm without mention of rupture 02/07/2012  . CAD (coronary artery disease)   . Neuropathy   . HTN (hypertension)   . Dyslipidemia   . CVA (cerebral vascular accident)   . AAA (abdominal aortic aneurysm)   . Renal artery stenosis   . Tobacco abuse   . Hx of CABG   . Ejection fraction     ROS   Patient denies fever, chills, headache, sweats, rash, change in vision, change in hearing, chest pain, cough, nausea vomiting, urinary symptoms. All other systems are reviewed and are negative.  PHYSICAL EXAM  Patient is here with his wife. He is oriented to person time and place. Affect is normal. There is no jugulovenous distention. Lungs are clear. Respiratory effort is nonlabored. Cardiac exam reveals S1 and S2. There no clicks or significant murmurs. His abdomen is healing nicely. He is fatigued. There no musculoskeletal deformities. There are no skin rashes.    Filed Vitals:   08/27/12 1145  BP: 124/58  Pulse: 85  Height: 5\' 10"  (1.778 m)  Weight: 200 lb 12.8 oz (91.082 kg)  SpO2: 96%   I am looking to review his hospital EKGs but they are not yet viewable. I reviewed our cardiology consult note. There is mention of an EKG in our consult note showing no acute changes.  ASSESSMENT & PLAN

## 2012-08-27 NOTE — Assessment & Plan Note (Addendum)
Coronary disease is stable. His nuclear scan was done in 2013. He was stable during his major vascular surgery recently. He's not having any symptoms. No further workup. We will be looking for his hospital-based EKGs.  The question has come up as to whether he should use two 81 mg aspirin pills per day as he has done for a long time. Dr. Selena Batten has raised the question as to whether he would be better on 1 Plavix. My preference is to use aspirin with only one tablet of 81 mg. I explained this to the patient and his wife. There is no definite indication for Plavix at this time. There is no contraindication to a small amount of aspirin.  As part of today's evaluation I spent greater than 25 minutes with a total care. More than half of this time is been spent with direct contact with the patient and his wife discussing multiple issues.

## 2012-08-27 NOTE — Patient Instructions (Addendum)
Your physician has recommended you make the following change in your medication: take Aspirin 81 mg daily (no Plavix)  Your physician wants you to follow-up in: 6 months You will receive a reminder letter in the mail two months in advance. If you don't receive a letter, please call our office to schedule the follow-up appointment.

## 2012-08-27 NOTE — Assessment & Plan Note (Signed)
The patient is doing well after his abdominal aortic aneurysm repair. He is weak that getting stronger.

## 2012-08-27 NOTE — Assessment & Plan Note (Signed)
The patient had been smoking up until this admission. He says that he is now finally stopped.

## 2012-08-27 NOTE — Assessment & Plan Note (Signed)
His blood pressure is stable at this time. No change in therapy.

## 2012-08-28 DIAGNOSIS — I69959 Hemiplegia and hemiparesis following unspecified cerebrovascular disease affecting unspecified side: Secondary | ICD-10-CM | POA: Diagnosis not present

## 2012-08-28 DIAGNOSIS — I2510 Atherosclerotic heart disease of native coronary artery without angina pectoris: Secondary | ICD-10-CM | POA: Diagnosis not present

## 2012-08-28 DIAGNOSIS — Z48812 Encounter for surgical aftercare following surgery on the circulatory system: Secondary | ICD-10-CM | POA: Diagnosis not present

## 2012-08-28 DIAGNOSIS — I10 Essential (primary) hypertension: Secondary | ICD-10-CM | POA: Diagnosis not present

## 2012-08-30 DIAGNOSIS — I2510 Atherosclerotic heart disease of native coronary artery without angina pectoris: Secondary | ICD-10-CM | POA: Diagnosis not present

## 2012-08-30 DIAGNOSIS — I69959 Hemiplegia and hemiparesis following unspecified cerebrovascular disease affecting unspecified side: Secondary | ICD-10-CM | POA: Diagnosis not present

## 2012-08-30 DIAGNOSIS — I10 Essential (primary) hypertension: Secondary | ICD-10-CM | POA: Diagnosis not present

## 2012-08-30 DIAGNOSIS — Z48812 Encounter for surgical aftercare following surgery on the circulatory system: Secondary | ICD-10-CM | POA: Diagnosis not present

## 2012-09-02 ENCOUNTER — Encounter: Payer: Self-pay | Admitting: Vascular Surgery

## 2012-09-03 ENCOUNTER — Ambulatory Visit (INDEPENDENT_AMBULATORY_CARE_PROVIDER_SITE_OTHER): Payer: Medicare Other | Admitting: Vascular Surgery

## 2012-09-03 ENCOUNTER — Encounter (INDEPENDENT_AMBULATORY_CARE_PROVIDER_SITE_OTHER): Payer: Medicare Other | Admitting: *Deleted

## 2012-09-03 ENCOUNTER — Encounter: Payer: Self-pay | Admitting: Vascular Surgery

## 2012-09-03 ENCOUNTER — Ambulatory Visit: Payer: Medicare Other | Admitting: Vascular Surgery

## 2012-09-03 DIAGNOSIS — Z48812 Encounter for surgical aftercare following surgery on the circulatory system: Secondary | ICD-10-CM | POA: Diagnosis not present

## 2012-09-03 DIAGNOSIS — I739 Peripheral vascular disease, unspecified: Secondary | ICD-10-CM

## 2012-09-03 DIAGNOSIS — I714 Abdominal aortic aneurysm, without rupture, unspecified: Secondary | ICD-10-CM

## 2012-09-03 NOTE — Progress Notes (Signed)
The patient has today for followup of his urgent aortobifemoral bypass grafting on 08/09/2012 for critical limb ischemia of his right lower extremity and repair of his infrarenal abdominal aortic aneurysm. He did have difficulty in the immediate followup. With volume overload. He did respond to diuresis and was discharged to home. He actually looks quite good today. He does continue to have some chronic pain in his right foot and this has been felt to be neuropathy in the past. He does not have any tissue loss in his lower Shoney's. He reports that his abdominal soreness has resolved. He is having a slow return of appetite and bowel function. He is using a laxative occasionally.  Past Medical History  Diagnosis Date  . HTN (hypertension)   . Dyslipidemia   . CVA (cerebral vascular accident)   . AAA (abdominal aortic aneurysm)     Surgical repair June, 2014  . Renal artery stenosis   . Tobacco abuse   . CAD (coronary artery disease)   . Hx of CABG     2005  . Ejection fraction   . Neuropathy     Right foot  . Leg pain   . Abdominal pain   . Hyperlipidemia   . Nervousness(799.21)   . Peripheral vascular disease   . Carotid artery occlusion   . COPD (chronic obstructive pulmonary disease) 08/15/2012  . Status post abdominal aortic aneurysm repair     June, 2014    History  Substance Use Topics  . Smoking status: Former Smoker -- 0.50 packs/day for 50 years    Types: Cigarettes  . Smokeless tobacco: Not on file  . Alcohol Use: Yes     Comment: occasionally    Family History  Problem Relation Age of Onset  . Tuberculosis Mother   . Coronary artery disease Father     No Known Allergies  Current outpatient prescriptions:aspirin EC 81 MG tablet, Take 81 mg by mouth daily. , Disp: , Rfl: ;  atorvastatin (LIPITOR) 40 MG tablet, Take 40 mg by mouth daily. , Disp: , Rfl: ;  cholecalciferol (VITAMIN D) 1000 UNITS tablet, Take 1,000 Units by mouth daily. , Disp: , Rfl: ;  Cyanocobalamin  (VITAMIN B-12 PO), Take 1,000 mcg by mouth daily. Takes 2 tablets daily, Disp: , Rfl:  furosemide (LASIX) 20 MG tablet, Take 20 mg by mouth daily. Takes 1/2 of 20 mg tablet daily, Disp: , Rfl: ;  lisinopril (PRINIVIL,ZESTRIL) 20 MG tablet, Take 1 tablet (20 mg total) by mouth daily., Disp: , Rfl: ;  mometasone-formoterol (DULERA) 100-5 MCG/ACT AERO, Inhale 2 puffs into the lungs 2 (two) times daily., Disp: , Rfl: ;  oxybutynin (DITROPAN-XL) 5 MG 24 hr tablet, , Disp: , Rfl:  oxyCODONE (OXY IR/ROXICODONE) 5 MG immediate release tablet, Take 1-2 tablets (5-10 mg total) by mouth every 6 (six) hours as needed., Disp: 60 tablet, Rfl: 0;  pioglitazone (ACTOS) 15 MG tablet, Take 15 mg by mouth daily. Takes 1/2 of 15 mg tablet daily, Disp: , Rfl: ;  senna-docusate (SENOKOT-S) 8.6-50 MG per tablet, Take 2 tablets by mouth 2 (two) times daily. Over the counter medication for constipation, Disp: , Rfl:  zolpidem (AMBIEN) 10 MG tablet, Take 10 mg by mouth at bedtime. , Disp: , Rfl: ;  traMADol (ULTRAM) 50 MG tablet, Take 50 mg by mouth every 6 (six) hours as needed for pain. For pain, Disp: , Rfl: ;  vitamin B-12 (CYANOCOBALAMIN) 100 MCG tablet, Take 50 mcg by mouth daily. ,  Disp: , Rfl:   There were no vitals taken for this visit.  There is no weight on file to calculate BMI.       Physical exam well-developed well-nourished gentleman in no acute distress Abdomen soft nontender midline and groin incisions are completely healed. He does have 2+ femoral pulses bilaterally Both feet are warm and well perfused with no palpable pulses  Vascular lab study his ankle arm index of point 0.43 on the right and 0.69 on the left  Impression and plan: Stable followup following urgent aortobifemoral bypass for critical limb ischemia on the right and also abdominal aortic aneurysm. He will continue his usual activities. We will see him again in 2 months for continued followup

## 2012-09-04 DIAGNOSIS — I69959 Hemiplegia and hemiparesis following unspecified cerebrovascular disease affecting unspecified side: Secondary | ICD-10-CM | POA: Diagnosis not present

## 2012-09-04 DIAGNOSIS — Z48812 Encounter for surgical aftercare following surgery on the circulatory system: Secondary | ICD-10-CM | POA: Diagnosis not present

## 2012-09-04 DIAGNOSIS — I10 Essential (primary) hypertension: Secondary | ICD-10-CM | POA: Diagnosis not present

## 2012-09-04 DIAGNOSIS — I2510 Atherosclerotic heart disease of native coronary artery without angina pectoris: Secondary | ICD-10-CM | POA: Diagnosis not present

## 2012-09-06 DIAGNOSIS — Z48812 Encounter for surgical aftercare following surgery on the circulatory system: Secondary | ICD-10-CM | POA: Diagnosis not present

## 2012-09-06 DIAGNOSIS — I69959 Hemiplegia and hemiparesis following unspecified cerebrovascular disease affecting unspecified side: Secondary | ICD-10-CM | POA: Diagnosis not present

## 2012-09-06 DIAGNOSIS — I10 Essential (primary) hypertension: Secondary | ICD-10-CM | POA: Diagnosis not present

## 2012-09-06 DIAGNOSIS — I2510 Atherosclerotic heart disease of native coronary artery without angina pectoris: Secondary | ICD-10-CM | POA: Diagnosis not present

## 2012-09-09 ENCOUNTER — Encounter: Payer: Self-pay | Admitting: Physical Medicine & Rehabilitation

## 2012-09-09 ENCOUNTER — Encounter: Payer: Medicare Other | Attending: Physical Medicine & Rehabilitation

## 2012-09-09 ENCOUNTER — Ambulatory Visit (HOSPITAL_BASED_OUTPATIENT_CLINIC_OR_DEPARTMENT_OTHER): Payer: Medicare Other | Admitting: Physical Medicine & Rehabilitation

## 2012-09-09 VITALS — BP 106/61 | HR 83 | Resp 14 | Ht 71.0 in | Wt 195.0 lb

## 2012-09-09 DIAGNOSIS — M7750 Other enthesopathy of unspecified foot: Secondary | ICD-10-CM

## 2012-09-09 DIAGNOSIS — M722 Plantar fascial fibromatosis: Secondary | ICD-10-CM

## 2012-09-09 DIAGNOSIS — Z8673 Personal history of transient ischemic attack (TIA), and cerebral infarction without residual deficits: Secondary | ICD-10-CM | POA: Diagnosis not present

## 2012-09-09 DIAGNOSIS — M7741 Metatarsalgia, right foot: Secondary | ICD-10-CM

## 2012-09-09 DIAGNOSIS — M79609 Pain in unspecified limb: Secondary | ICD-10-CM | POA: Diagnosis not present

## 2012-09-09 DIAGNOSIS — Z79899 Other long term (current) drug therapy: Secondary | ICD-10-CM | POA: Diagnosis not present

## 2012-09-09 DIAGNOSIS — I739 Peripheral vascular disease, unspecified: Secondary | ICD-10-CM | POA: Diagnosis not present

## 2012-09-09 DIAGNOSIS — I10 Essential (primary) hypertension: Secondary | ICD-10-CM | POA: Diagnosis not present

## 2012-09-09 MED ORDER — HYDROCODONE-ACETAMINOPHEN 5-325 MG PO TABS: 1.0000 | ORAL_TABLET | Freq: Two times a day (BID) | ORAL | Status: DC

## 2012-09-09 NOTE — Progress Notes (Signed)
Subjective:    Patient ID: [name] [name], male    DOB: December 31, 1931, 79 y.o.   MRN: [id number]  HPI [name] [name] is a 79 y.o. male with history of HTN, CVA, AAA, tobacco abuse, PAD with severe right foot pain with limb threatening ischemia. He was admitted on 08/09/12 for AA with extensive right femoral endarterectomy and resection of AAA by Dr. Arbie Cookey. Post op has had issues with ileus, fluid overload as well as bradycardia. Coreg was discontinued by cardiology. Constipation has resolved and respiratory status improved with diuresis. Therapies initiated and patient noted to be deconditioned. He was admitted to CIR for progressive therapies  R foot pain mainly first thing in am Constipation  Pain Inventory Average Pain 6 Pain Right Now 9 My pain is burning, stabbing and tingling  In the last 24 hours, has pain interfered with the following? General activity 1 Relation with others 0 Enjoyment of life 9 What TIME of day is your pain at its worst? morning Sleep (in general) Fair  Pain is worse with: walking and standing Pain improves with: medication Relief from Meds: 5  Mobility use a cane how many minutes can you walk? 2 ability to climb steps?  yes do you drive?  no  Function retired I need assistance with the following:  meal prep, household duties and shopping  Neuro/Psych bladder control problems bowel control problems weakness tingling trouble walking spasms depression anxiety  Prior Studies Any changes since last visit?  no  Physicians involved in your care Any changes since last visit?  no   Family History  Problem Relation Age of Onset  . Tuberculosis Mother   . Coronary artery disease Father    History   Social History  . Marital Status: Married    Spouse Name: N/A    Number of Children: N/A  . Years of Education: N/A   Occupational History  . retired    Social History Main Topics  . Smoking status: Former Smoker -- 0.50 packs/day for 50  years    Types: Cigarettes  . Smokeless tobacco: None  . Alcohol Use: Yes     Comment: occasionally  . Drug Use: No  . Sexually Active: None   Other Topics Concern  . None   Social History Narrative  . None   Past Surgical History  Procedure Laterality Date  . Coronary artery bypass graft  2005    x4  . Spine surgery  1977    Lumbar HNP surgery  . Aorta - bilateral femoral artery bypass graft N/A 08/09/2012    Procedure: AORTA BIFEMORAL BYPASS GRAFT;  Surgeon: Larina Earthly, MD;  Location: [facility] OR;  Service: Vascular;  Laterality: N/A;   Past Medical History  Diagnosis Date  . HTN (hypertension)   . Dyslipidemia   . CVA (cerebral vascular accident)   . AAA (abdominal aortic aneurysm)     Surgical repair June, 2014  . Renal artery stenosis   . Tobacco abuse   . CAD (coronary artery disease)   . Hx of CABG     2005  . Ejection fraction   . Neuropathy     Right foot  . Leg pain   . Abdominal pain   . Hyperlipidemia   . Nervousness(799.21)   . Peripheral vascular disease   . Carotid artery occlusion   . COPD (chronic obstructive pulmonary disease) 08/15/2012  . Status post abdominal aortic aneurysm repair     June, 2014   BP 106/61  Pulse 83  Resp 14  Ht 5\' 11"  (1.803 m)  Wt 195 lb (88.451 kg)  BMI 27.21 kg/m2  SpO2 95%    Review of Systems  Constitutional: Positive for appetite change and unexpected weight change.  Gastrointestinal: Positive for constipation.  Genitourinary:       Bladder control problems  Musculoskeletal: Positive for gait problem.       Spasms  Neurological: Positive for weakness.       Tingling  Psychiatric/Behavioral: Positive for dysphoric mood. The patient is nervous/anxious.   All other systems reviewed and are negative.       Objective:   Physical Exam  Nursing note and vitals reviewed. Constitutional: He is oriented to person, place, and time. He appears well-developed and well-nourished.  HENT:  Head: Normocephalic and  atraumatic.  Right Ear: External ear normal.  Left Ear: External ear normal.  Eyes: Conjunctivae and EOM are normal. Pupils are equal, round, and reactive to light.  Cardiovascular:  Pulses:      Dorsalis pedis pulses are 1+ on the right side, and 1+ on the left side.       Posterior tibial pulses are 1+ on the right side, and 1+ on the left side.  Musculoskeletal:       Right ankle: Normal. Achilles tendon normal.       Right foot: He exhibits tenderness. He exhibits normal range of motion, normal capillary refill and no deformity.  Right foot has tenderness over the plantar aspect of the heel and metatarsal heads  Neurological: He is alert and oriented to person, place, and time. He has normal reflexes.  Skin: Skin is warm and dry. No erythema.  Psychiatric: He has a normal mood and affect.          Assessment & Plan:  1. Right foot pain, appears to be musculoskeletal. I suspect metatarsalgia or plantar fascia this period he has seen a podiatrist in the past. Recommend followup. From a pains standpoint, used to take tramadol prior to admission. Now taking Oxytrol don't 2-3 tablets per day. Will change to hydrocodone 2 tablets per day and can supplement with tramadol 4 times a day when necessary. Stop hydrocodone after one month  Followup with vascular surgery PM and R. followup when necessary

## 2012-09-09 NOTE — Patient Instructions (Addendum)
May resume tramadol as needed  Change oxycodone to hydrocodone  Take miralax everyday  Please call foot doctor you may have plantar fasciitis,and or metatarsalgia

## 2012-09-10 DIAGNOSIS — I10 Essential (primary) hypertension: Secondary | ICD-10-CM | POA: Diagnosis not present

## 2012-09-10 DIAGNOSIS — Z48812 Encounter for surgical aftercare following surgery on the circulatory system: Secondary | ICD-10-CM | POA: Diagnosis not present

## 2012-09-10 DIAGNOSIS — I69959 Hemiplegia and hemiparesis following unspecified cerebrovascular disease affecting unspecified side: Secondary | ICD-10-CM | POA: Diagnosis not present

## 2012-09-10 DIAGNOSIS — I2510 Atherosclerotic heart disease of native coronary artery without angina pectoris: Secondary | ICD-10-CM | POA: Diagnosis not present

## 2012-09-12 DIAGNOSIS — I69959 Hemiplegia and hemiparesis following unspecified cerebrovascular disease affecting unspecified side: Secondary | ICD-10-CM | POA: Diagnosis not present

## 2012-09-12 DIAGNOSIS — I2510 Atherosclerotic heart disease of native coronary artery without angina pectoris: Secondary | ICD-10-CM | POA: Diagnosis not present

## 2012-09-12 DIAGNOSIS — Z48812 Encounter for surgical aftercare following surgery on the circulatory system: Secondary | ICD-10-CM | POA: Diagnosis not present

## 2012-09-12 DIAGNOSIS — I10 Essential (primary) hypertension: Secondary | ICD-10-CM | POA: Diagnosis not present

## 2012-09-17 DIAGNOSIS — I69959 Hemiplegia and hemiparesis following unspecified cerebrovascular disease affecting unspecified side: Secondary | ICD-10-CM | POA: Diagnosis not present

## 2012-09-17 DIAGNOSIS — Z48812 Encounter for surgical aftercare following surgery on the circulatory system: Secondary | ICD-10-CM | POA: Diagnosis not present

## 2012-09-17 DIAGNOSIS — I10 Essential (primary) hypertension: Secondary | ICD-10-CM | POA: Diagnosis not present

## 2012-09-17 DIAGNOSIS — I2510 Atherosclerotic heart disease of native coronary artery without angina pectoris: Secondary | ICD-10-CM | POA: Diagnosis not present

## 2012-09-24 DIAGNOSIS — I10 Essential (primary) hypertension: Secondary | ICD-10-CM | POA: Diagnosis not present

## 2012-09-24 DIAGNOSIS — I69959 Hemiplegia and hemiparesis following unspecified cerebrovascular disease affecting unspecified side: Secondary | ICD-10-CM | POA: Diagnosis not present

## 2012-09-24 DIAGNOSIS — I2510 Atherosclerotic heart disease of native coronary artery without angina pectoris: Secondary | ICD-10-CM | POA: Diagnosis not present

## 2012-09-24 DIAGNOSIS — Z48812 Encounter for surgical aftercare following surgery on the circulatory system: Secondary | ICD-10-CM | POA: Diagnosis not present

## 2012-09-26 DIAGNOSIS — I10 Essential (primary) hypertension: Secondary | ICD-10-CM | POA: Diagnosis not present

## 2012-09-26 DIAGNOSIS — I2510 Atherosclerotic heart disease of native coronary artery without angina pectoris: Secondary | ICD-10-CM | POA: Diagnosis not present

## 2012-09-26 DIAGNOSIS — I69959 Hemiplegia and hemiparesis following unspecified cerebrovascular disease affecting unspecified side: Secondary | ICD-10-CM | POA: Diagnosis not present

## 2012-09-26 DIAGNOSIS — Z48812 Encounter for surgical aftercare following surgery on the circulatory system: Secondary | ICD-10-CM | POA: Diagnosis not present

## 2012-10-04 ENCOUNTER — Telehealth: Payer: Self-pay

## 2012-10-04 ENCOUNTER — Telehealth: Payer: Self-pay | Admitting: *Deleted

## 2012-10-04 DIAGNOSIS — I10 Essential (primary) hypertension: Secondary | ICD-10-CM | POA: Diagnosis not present

## 2012-10-04 DIAGNOSIS — Z48812 Encounter for surgical aftercare following surgery on the circulatory system: Secondary | ICD-10-CM | POA: Diagnosis not present

## 2012-10-04 DIAGNOSIS — I2510 Atherosclerotic heart disease of native coronary artery without angina pectoris: Secondary | ICD-10-CM | POA: Diagnosis not present

## 2012-10-04 DIAGNOSIS — I69959 Hemiplegia and hemiparesis following unspecified cerebrovascular disease affecting unspecified side: Secondary | ICD-10-CM | POA: Diagnosis not present

## 2012-10-04 MED ORDER — HYDROCODONE-ACETAMINOPHEN 5-325 MG PO TABS: 1.0000 | ORAL_TABLET | Freq: Two times a day (BID) | ORAL | Status: DC

## 2012-10-04 NOTE — Telephone Encounter (Signed)
Refill hydrocodone.  Will call to pharmacy but not due until 8/5.  He can get it filled 2 days early probably at pharmacy.  Talked with pt about this.

## 2012-10-04 NOTE — Telephone Encounter (Signed)
Phone call from pt.  Reports has had pain in abdomen just under the umbilicus, intermittently for 2 wks.  Denies any tenderness with palpation of site.  Denies n/v, fever or chills, abdominal bloating.  States is passing gas and having normal bowel movements.  Describes the discomfort as a "deep ache".  States the pain only lasts a few seconds.  States it happens very sporadic; may not have the discomfort for 1-2 days, and then will notice it 1-2 times in one day.  Discussed with Dr. Imogene Burn.  Advised to have pt. Contact his PCP for further evaluation, and to keep follow-up appt. with Dr. Arbie Cookey.  Pt. Advised of Dr. Nicky Pugh recommendations and verb. Understanding.

## 2012-10-10 DIAGNOSIS — M7989 Other specified soft tissue disorders: Secondary | ICD-10-CM | POA: Diagnosis not present

## 2012-10-10 DIAGNOSIS — Q6689 Other  specified congenital deformities of feet: Secondary | ICD-10-CM | POA: Diagnosis not present

## 2012-10-10 DIAGNOSIS — L608 Other nail disorders: Secondary | ICD-10-CM | POA: Diagnosis not present

## 2012-10-10 DIAGNOSIS — I739 Peripheral vascular disease, unspecified: Secondary | ICD-10-CM | POA: Diagnosis not present

## 2012-10-18 ENCOUNTER — Other Ambulatory Visit: Payer: Self-pay | Admitting: *Deleted

## 2012-10-18 MED ORDER — LISINOPRIL 20 MG PO TABS: 20.0000 mg | ORAL_TABLET | Freq: Every day | ORAL | Status: DC

## 2012-11-01 ENCOUNTER — Encounter: Payer: Self-pay | Admitting: Vascular Surgery

## 2012-11-05 ENCOUNTER — Ambulatory Visit (INDEPENDENT_AMBULATORY_CARE_PROVIDER_SITE_OTHER): Payer: Self-pay | Admitting: Vascular Surgery

## 2012-11-05 ENCOUNTER — Encounter: Payer: Self-pay | Admitting: Vascular Surgery

## 2012-11-05 VITALS — BP 137/80 | HR 57 | Resp 16 | Ht 70.0 in | Wt 193.9 lb

## 2012-11-05 DIAGNOSIS — I739 Peripheral vascular disease, unspecified: Secondary | ICD-10-CM

## 2012-11-05 NOTE — Progress Notes (Signed)
The patient presents today for a followup of urgent aortobifemoral bypass. He presented with a critical ischemia of his right lower extremity. He also had a known 5 cm aneurysm. He did well with the surgery. He continues to gain his preoperative stamina. He reports that his weight loss is a total of approximately 7 or 8 pounds currently from his preoperative weight. He does continue to have neuropathic-type pain in his right foot. This is improving slowly as well  On physical exam his abdominal incision is well healed as are his femoral incisions. He has 2+ femoral pulses bilaterally. He does have known superficial femoral artery occlusive disease with no palpable pedal pulses. His feet are warm and well perfused.  Impression and plan stable continue recovery following urgent aortobifemoral bypass for critical limb ischemia on the right and moderate size aneurysm. He is questioning the use of gabapentin he was neuropathic-type pain. I have prescription today for 300 mg daily. He understands that this could be or increased if he is having inadequate symptom relief. We will see him again in 6 months for continued followup

## 2012-11-11 ENCOUNTER — Other Ambulatory Visit: Payer: Self-pay | Admitting: *Deleted

## 2012-11-11 DIAGNOSIS — I739 Peripheral vascular disease, unspecified: Secondary | ICD-10-CM

## 2012-11-11 DIAGNOSIS — S5290XA Unspecified fracture of unspecified forearm, initial encounter for closed fracture: Secondary | ICD-10-CM | POA: Diagnosis not present

## 2012-11-11 DIAGNOSIS — R079 Chest pain, unspecified: Secondary | ICD-10-CM | POA: Diagnosis not present

## 2012-11-11 DIAGNOSIS — Z48812 Encounter for surgical aftercare following surgery on the circulatory system: Secondary | ICD-10-CM

## 2012-11-11 DIAGNOSIS — M79609 Pain in unspecified limb: Secondary | ICD-10-CM | POA: Diagnosis not present

## 2012-11-11 DIAGNOSIS — M25539 Pain in unspecified wrist: Secondary | ICD-10-CM | POA: Diagnosis not present

## 2012-11-27 DIAGNOSIS — I10 Essential (primary) hypertension: Secondary | ICD-10-CM | POA: Diagnosis not present

## 2012-11-27 DIAGNOSIS — E119 Type 2 diabetes mellitus without complications: Secondary | ICD-10-CM | POA: Diagnosis not present

## 2012-11-28 DIAGNOSIS — D485 Neoplasm of uncertain behavior of skin: Secondary | ICD-10-CM | POA: Diagnosis not present

## 2013-02-04 ENCOUNTER — Other Ambulatory Visit: Payer: Self-pay | Admitting: *Deleted

## 2013-02-04 DIAGNOSIS — M792 Neuralgia and neuritis, unspecified: Secondary | ICD-10-CM

## 2013-02-04 MED ORDER — GABAPENTIN 300 MG PO CAPS: 300.0000 mg | ORAL_CAPSULE | Freq: Every day | ORAL | Status: DC

## 2013-02-15 ENCOUNTER — Encounter: Payer: Self-pay | Admitting: Cardiology

## 2013-02-17 ENCOUNTER — Ambulatory Visit: Payer: Medicare Other | Admitting: Cardiology

## 2013-03-04 ENCOUNTER — Encounter: Payer: Self-pay | Admitting: Cardiology

## 2013-04-07 ENCOUNTER — Ambulatory Visit: Payer: Medicare Other | Admitting: Cardiology

## 2013-04-08 ENCOUNTER — Emergency Department (HOSPITAL_COMMUNITY): Payer: Medicare Other

## 2013-04-08 ENCOUNTER — Inpatient Hospital Stay (HOSPITAL_COMMUNITY)
Admission: EM | Admit: 2013-04-08 | Discharge: 2013-04-12 | DRG: 470 | Disposition: A | Discharge status: Skilled Nursing Facility | Payer: Medicare Other | Attending: Internal Medicine | Admitting: Internal Medicine

## 2013-04-08 ENCOUNTER — Encounter (HOSPITAL_COMMUNITY): Payer: Self-pay | Admitting: Emergency Medicine

## 2013-04-08 DIAGNOSIS — E1142 Type 2 diabetes mellitus with diabetic polyneuropathy: Secondary | ICD-10-CM | POA: Diagnosis not present

## 2013-04-08 DIAGNOSIS — R7989 Other specified abnormal findings of blood chemistry: Secondary | ICD-10-CM | POA: Diagnosis present

## 2013-04-08 DIAGNOSIS — N183 Chronic kidney disease, stage 3 unspecified: Secondary | ICD-10-CM | POA: Diagnosis present

## 2013-04-08 DIAGNOSIS — I6350 Cerebral infarction due to unspecified occlusion or stenosis of unspecified cerebral artery: Secondary | ICD-10-CM | POA: Diagnosis not present

## 2013-04-08 DIAGNOSIS — S79929A Unspecified injury of unspecified thigh, initial encounter: Secondary | ICD-10-CM | POA: Diagnosis not present

## 2013-04-08 DIAGNOSIS — R0600 Dyspnea, unspecified: Secondary | ICD-10-CM

## 2013-04-08 DIAGNOSIS — I639 Cerebral infarction, unspecified: Secondary | ICD-10-CM

## 2013-04-08 DIAGNOSIS — M6281 Muscle weakness (generalized): Secondary | ICD-10-CM | POA: Diagnosis not present

## 2013-04-08 DIAGNOSIS — Z471 Aftercare following joint replacement surgery: Secondary | ICD-10-CM | POA: Diagnosis not present

## 2013-04-08 DIAGNOSIS — N058 Unspecified nephritic syndrome with other morphologic changes: Secondary | ICD-10-CM | POA: Diagnosis present

## 2013-04-08 DIAGNOSIS — I2510 Atherosclerotic heart disease of native coronary artery without angina pectoris: Secondary | ICD-10-CM | POA: Diagnosis not present

## 2013-04-08 DIAGNOSIS — I701 Atherosclerosis of renal artery: Secondary | ICD-10-CM

## 2013-04-08 DIAGNOSIS — Z8673 Personal history of transient ischemic attack (TIA), and cerebral infarction without residual deficits: Secondary | ICD-10-CM | POA: Diagnosis not present

## 2013-04-08 DIAGNOSIS — E119 Type 2 diabetes mellitus without complications: Secondary | ICD-10-CM | POA: Diagnosis not present

## 2013-04-08 DIAGNOSIS — Z6827 Body mass index (BMI) 27.0-27.9, adult: Secondary | ICD-10-CM

## 2013-04-08 DIAGNOSIS — M25559 Pain in unspecified hip: Secondary | ICD-10-CM | POA: Diagnosis not present

## 2013-04-08 DIAGNOSIS — N189 Chronic kidney disease, unspecified: Secondary | ICD-10-CM

## 2013-04-08 DIAGNOSIS — S72009A Fracture of unspecified part of neck of unspecified femur, initial encounter for closed fracture: Secondary | ICD-10-CM | POA: Diagnosis not present

## 2013-04-08 DIAGNOSIS — J4489 Other specified chronic obstructive pulmonary disease: Secondary | ICD-10-CM | POA: Diagnosis not present

## 2013-04-08 DIAGNOSIS — Z7982 Long term (current) use of aspirin: Secondary | ICD-10-CM

## 2013-04-08 DIAGNOSIS — I10 Essential (primary) hypertension: Secondary | ICD-10-CM

## 2013-04-08 DIAGNOSIS — Z8249 Family history of ischemic heart disease and other diseases of the circulatory system: Secondary | ICD-10-CM | POA: Diagnosis not present

## 2013-04-08 DIAGNOSIS — I252 Old myocardial infarction: Secondary | ICD-10-CM | POA: Diagnosis not present

## 2013-04-08 DIAGNOSIS — S72001A Fracture of unspecified part of neck of right femur, initial encounter for closed fracture: Secondary | ICD-10-CM

## 2013-04-08 DIAGNOSIS — R9430 Abnormal result of cardiovascular function study, unspecified: Secondary | ICD-10-CM

## 2013-04-08 DIAGNOSIS — Z48812 Encounter for surgical aftercare following surgery on the circulatory system: Secondary | ICD-10-CM

## 2013-04-08 DIAGNOSIS — Z9181 History of falling: Secondary | ICD-10-CM | POA: Diagnosis not present

## 2013-04-08 DIAGNOSIS — I739 Peripheral vascular disease, unspecified: Secondary | ICD-10-CM | POA: Diagnosis present

## 2013-04-08 DIAGNOSIS — E1129 Type 2 diabetes mellitus with other diabetic kidney complication: Secondary | ICD-10-CM | POA: Diagnosis present

## 2013-04-08 DIAGNOSIS — F0390 Unspecified dementia without behavioral disturbance: Secondary | ICD-10-CM | POA: Diagnosis present

## 2013-04-08 DIAGNOSIS — J449 Chronic obstructive pulmonary disease, unspecified: Secondary | ICD-10-CM | POA: Diagnosis not present

## 2013-04-08 DIAGNOSIS — R6889 Other general symptoms and signs: Secondary | ICD-10-CM | POA: Diagnosis not present

## 2013-04-08 DIAGNOSIS — Z9889 Other specified postprocedural states: Secondary | ICD-10-CM

## 2013-04-08 DIAGNOSIS — Z720 Tobacco use: Secondary | ICD-10-CM

## 2013-04-08 DIAGNOSIS — I714 Abdominal aortic aneurysm, without rupture, unspecified: Secondary | ICD-10-CM

## 2013-04-08 DIAGNOSIS — Z87891 Personal history of nicotine dependence: Secondary | ICD-10-CM

## 2013-04-08 DIAGNOSIS — E1149 Type 2 diabetes mellitus with other diabetic neurological complication: Secondary | ICD-10-CM | POA: Diagnosis present

## 2013-04-08 DIAGNOSIS — E8770 Fluid overload, unspecified: Secondary | ICD-10-CM

## 2013-04-08 DIAGNOSIS — I441 Atrioventricular block, second degree: Secondary | ICD-10-CM

## 2013-04-08 DIAGNOSIS — E785 Hyperlipidemia, unspecified: Secondary | ICD-10-CM

## 2013-04-08 DIAGNOSIS — S72033A Displaced midcervical fracture of unspecified femur, initial encounter for closed fracture: Secondary | ICD-10-CM | POA: Diagnosis not present

## 2013-04-08 DIAGNOSIS — I129 Hypertensive chronic kidney disease with stage 1 through stage 4 chronic kidney disease, or unspecified chronic kidney disease: Secondary | ICD-10-CM | POA: Diagnosis present

## 2013-04-08 DIAGNOSIS — R338 Other retention of urine: Secondary | ICD-10-CM

## 2013-04-08 DIAGNOSIS — R339 Retention of urine, unspecified: Secondary | ICD-10-CM | POA: Diagnosis not present

## 2013-04-08 DIAGNOSIS — R279 Unspecified lack of coordination: Secondary | ICD-10-CM | POA: Diagnosis not present

## 2013-04-08 DIAGNOSIS — D62 Acute posthemorrhagic anemia: Secondary | ICD-10-CM

## 2013-04-08 DIAGNOSIS — Z951 Presence of aortocoronary bypass graft: Secondary | ICD-10-CM

## 2013-04-08 DIAGNOSIS — S72009D Fracture of unspecified part of neck of unspecified femur, subsequent encounter for closed fracture with routine healing: Secondary | ICD-10-CM | POA: Diagnosis not present

## 2013-04-08 DIAGNOSIS — I69998 Other sequelae following unspecified cerebrovascular disease: Secondary | ICD-10-CM | POA: Diagnosis not present

## 2013-04-08 DIAGNOSIS — G629 Polyneuropathy, unspecified: Secondary | ICD-10-CM

## 2013-04-08 DIAGNOSIS — N179 Acute kidney failure, unspecified: Secondary | ICD-10-CM | POA: Diagnosis not present

## 2013-04-08 DIAGNOSIS — Z96649 Presence of unspecified artificial hip joint: Secondary | ICD-10-CM | POA: Diagnosis not present

## 2013-04-08 DIAGNOSIS — S79919A Unspecified injury of unspecified hip, initial encounter: Secondary | ICD-10-CM | POA: Diagnosis not present

## 2013-04-08 DIAGNOSIS — G909 Disorder of the autonomic nervous system, unspecified: Secondary | ICD-10-CM | POA: Diagnosis not present

## 2013-04-08 DIAGNOSIS — R1312 Dysphagia, oropharyngeal phase: Secondary | ICD-10-CM | POA: Diagnosis not present

## 2013-04-08 DIAGNOSIS — S298XXA Other specified injuries of thorax, initial encounter: Secondary | ICD-10-CM | POA: Diagnosis not present

## 2013-04-08 DIAGNOSIS — R269 Unspecified abnormalities of gait and mobility: Secondary | ICD-10-CM | POA: Diagnosis not present

## 2013-04-08 DIAGNOSIS — W19XXXA Unspecified fall, initial encounter: Secondary | ICD-10-CM | POA: Diagnosis present

## 2013-04-08 HISTORY — DX: Fracture of unspecified part of neck of right femur, initial encounter for closed fracture: S72.001A

## 2013-04-08 LAB — BASIC METABOLIC PANEL
BUN: 35 mg/dL — ABNORMAL HIGH (ref 6–23)
CO2: 22 mEq/L (ref 19–32)
Calcium: 9.1 mg/dL (ref 8.4–10.5)
Chloride: 102 mEq/L (ref 96–112)
Creatinine, Ser: 1.96 mg/dL — ABNORMAL HIGH (ref 0.50–1.35)
GFR calc Af Amer: 36 mL/min — ABNORMAL LOW (ref >90)
GFR calc non Af Amer: 31 mL/min — ABNORMAL LOW (ref >90)
Glucose, Bld: 131 mg/dL — ABNORMAL HIGH (ref 70–99)
Potassium: 4.4 mEq/L (ref 3.7–5.3)
Sodium: 138 mEq/L (ref 137–147)

## 2013-04-08 LAB — ABO/RH: ABO/RH(D): A POS

## 2013-04-08 LAB — PROTIME-INR
INR: 0.98 (ref 0.00–1.49)
Prothrombin Time: 12.8 seconds (ref 11.6–15.2)

## 2013-04-08 LAB — TYPE AND SCREEN
ABO/RH(D): A POS
Antibody Screen: NEGATIVE

## 2013-04-08 LAB — CBC WITH DIFFERENTIAL/PLATELET
Basophils Absolute: 0 10*3/uL (ref 0.0–0.1)
Basophils Relative: 0 % (ref 0–1)
Eosinophils Absolute: 0.4 10*3/uL (ref 0.0–0.7)
Eosinophils Relative: 3 % (ref 0–5)
HCT: 44 % (ref 39.0–52.0)
Hemoglobin: 14.2 g/dL (ref 13.0–17.0)
Lymphocytes Relative: 13 % (ref 12–46)
Lymphs Abs: 1.8 10*3/uL (ref 0.7–4.0)
MCH: 29.4 pg (ref 26.0–34.0)
MCHC: 32.3 g/dL (ref 30.0–36.0)
MCV: 91.1 fL (ref 78.0–100.0)
Monocytes Absolute: 0.7 10*3/uL (ref 0.1–1.0)
Monocytes Relative: 5 % (ref 3–12)
Neutro Abs: 10.9 10*3/uL — ABNORMAL HIGH (ref 1.7–7.7)
Neutrophils Relative %: 79 % — ABNORMAL HIGH (ref 43–77)
Platelets: 146 10*3/uL — ABNORMAL LOW (ref 150–400)
RBC: 4.83 MIL/uL (ref 4.22–5.81)
RDW: 14.5 % (ref 11.5–15.5)
WBC: 13.9 10*3/uL — ABNORMAL HIGH (ref 4.0–10.5)

## 2013-04-08 LAB — GLUCOSE, CAPILLARY: Glucose-Capillary: 121 mg/dL — ABNORMAL HIGH (ref 70–99)

## 2013-04-08 MED ORDER — OXYBUTYNIN CHLORIDE ER 5 MG PO TB24
5.0000 mg | ORAL_TABLET | Freq: Every day | ORAL | Status: DC
  Administered 2013-04-10 – 2013-04-12 (×3): 5 mg via ORAL
  Filled 2013-04-08 (×4): qty 1

## 2013-04-08 MED ORDER — GABAPENTIN 300 MG PO CAPS
300.0000 mg | ORAL_CAPSULE | Freq: Every day | ORAL | Status: DC
  Administered 2013-04-10 – 2013-04-12 (×3): 300 mg via ORAL
  Filled 2013-04-08 (×4): qty 1

## 2013-04-08 MED ORDER — ZOLPIDEM TARTRATE 5 MG PO TABS
5.0000 mg | ORAL_TABLET | Freq: Every evening | ORAL | Status: DC | PRN
  Filled 2013-04-08: qty 1

## 2013-04-08 MED ORDER — FUROSEMIDE 20 MG PO TABS: 10.0000 mg | ORAL_TABLET | Freq: Every day | ORAL | Status: DC

## 2013-04-08 MED ORDER — MORPHINE SULFATE 4 MG/ML IJ SOLN
4.0000 mg | Freq: Once | INTRAMUSCULAR | Status: AC
  Administered 2013-04-08: 4 mg via INTRAVENOUS
  Filled 2013-04-08: qty 1

## 2013-04-08 MED ORDER — INFLUENZA VAC SPLIT QUAD 0.5 ML IM SUSP
0.5000 mL | INTRAMUSCULAR | Status: DC
  Filled 2013-04-08 (×2): qty 0.5

## 2013-04-08 MED ORDER — LISINOPRIL 20 MG PO TABS
20.0000 mg | ORAL_TABLET | Freq: Every day | ORAL | Status: DC
  Filled 2013-04-08: qty 1

## 2013-04-08 MED ORDER — CEFAZOLIN SODIUM-DEXTROSE 2-3 GM-% IV SOLR
2.0000 g | Freq: Once | INTRAVENOUS | Status: AC
  Administered 2013-04-09: 2 g via INTRAVENOUS
  Filled 2013-04-08: qty 50

## 2013-04-08 MED ORDER — ATORVASTATIN CALCIUM 40 MG PO TABS
40.0000 mg | ORAL_TABLET | Freq: Every day | ORAL | Status: DC
  Administered 2013-04-10 – 2013-04-12 (×3): 40 mg via ORAL
  Filled 2013-04-08 (×4): qty 1

## 2013-04-08 MED ORDER — ASPIRIN EC 325 MG PO TBEC
325.0000 mg | DELAYED_RELEASE_TABLET | Freq: Every day | ORAL | Status: DC
  Administered 2013-04-10 – 2013-04-12 (×3): 325 mg via ORAL
  Filled 2013-04-08 (×5): qty 1

## 2013-04-08 MED ORDER — ZOLPIDEM TARTRATE 10 MG PO TABS: 10.0000 mg | ORAL_TABLET | Freq: Every day | ORAL | Status: DC

## 2013-04-08 MED ORDER — VITAMIN B-12 100 MCG PO TABS
50.0000 ug | ORAL_TABLET | Freq: Every day | ORAL | Status: DC
  Administered 2013-04-10 – 2013-04-12 (×3): 50 ug via ORAL
  Filled 2013-04-08 (×5): qty 1

## 2013-04-08 MED ORDER — SODIUM CHLORIDE 0.9 % IV SOLN
INTRAVENOUS | Status: DC
  Administered 2013-04-08 – 2013-04-09 (×2): via INTRAVENOUS
  Administered 2013-04-09: 75 mL/h via INTRAVENOUS
  Administered 2013-04-10: 13:00:00 via INTRAVENOUS
  Filled 2013-04-08: qty 1000

## 2013-04-08 MED ORDER — MORPHINE SULFATE 2 MG/ML IJ SOLN
0.5000 mg | INTRAMUSCULAR | Status: DC | PRN
  Administered 2013-04-08 – 2013-04-09 (×5): 0.5 mg via INTRAVENOUS
  Filled 2013-04-08 (×6): qty 1

## 2013-04-08 MED ORDER — INSULIN ASPART 100 UNIT/ML ~~LOC~~ SOLN
0.0000 [IU] | SUBCUTANEOUS | Status: DC
  Administered 2013-04-09: 1 [IU] via SUBCUTANEOUS
  Filled 2013-04-08: qty 0.09

## 2013-04-08 MED ORDER — HYDROCODONE-ACETAMINOPHEN 5-325 MG PO TABS
1.0000 | ORAL_TABLET | Freq: Four times a day (QID) | ORAL | Status: DC | PRN
  Administered 2013-04-08 – 2013-04-10 (×3): 2 via ORAL
  Filled 2013-04-08 (×5): qty 2

## 2013-04-08 MED ORDER — TRAMADOL HCL 50 MG PO TABS: 50.0000 mg | ORAL_TABLET | Freq: Four times a day (QID) | ORAL | Status: DC | PRN

## 2013-04-08 NOTE — Consult Note (Signed)
Jani Gravel, MD Chief Complaint: Right hip fracture History: [name] [name] is a 80 y.o. male who suffered a mechanical fall 3 days ago (no LOC). Injured his R hip in the fall (didnt hit head, no other injury). Since that time has had extreme pain with attempting to walk so essentially didn't walk at all today. Presented to ED for evaluation. Found to have R femoral neck fracture.  Past Medical History  Diagnosis Date  . HTN (hypertension)   . Dyslipidemia   . CVA (cerebral vascular accident)   . AAA (abdominal aortic aneurysm)     Surgical repair June, 2014  . Renal artery stenosis   . Tobacco abuse   . CAD (coronary artery disease)   . Hx of CABG     2005  . Ejection fraction   . Neuropathy     Right foot  . Leg pain   . Abdominal pain   . Hyperlipidemia   . Nervousness(799.21)   . Peripheral vascular disease   . Carotid artery occlusion   . COPD (chronic obstructive pulmonary disease) 08/15/2012  . Status post abdominal aortic aneurysm repair     June, 2014    No Known Allergies  No current facility-administered medications on file prior to encounter.   Current Outpatient Prescriptions on File Prior to Encounter  Medication Sig Dispense Refill  . aspirin EC 81 MG tablet Take 81 mg by mouth daily.       Marland Kitchen atorvastatin (LIPITOR) 40 MG tablet Take 40 mg by mouth daily.       . cholecalciferol (VITAMIN D) 1000 UNITS tablet Take 1,000 Units by mouth daily.       . furosemide (LASIX) 20 MG tablet Take 10 mg by mouth daily.       Marland Kitchen gabapentin (NEURONTIN) 300 MG capsule Take 1 capsule (300 mg total) by mouth daily.  30 capsule  1  . HYDROcodone-acetaminophen (NORCO/VICODIN) 5-325 MG per tablet Take 1 tablet by mouth every 12 (twelve) hours.  60 tablet  0  . lisinopril (PRINIVIL,ZESTRIL) 20 MG tablet Take 1 tablet (20 mg total) by mouth daily.  30 tablet  5  . oxybutynin (DITROPAN-XL) 5 MG 24 hr tablet Take 5 mg by mouth daily.       . pioglitazone (ACTOS) 15 MG tablet Take 7.5  mg by mouth daily.       . traMADol (ULTRAM) 50 MG tablet Take 50 mg by mouth every 6 (six) hours as needed for moderate pain. For pain      . vitamin B-12 (CYANOCOBALAMIN) 100 MCG tablet Take 50 mcg by mouth daily.       Marland Kitchen zolpidem (AMBIEN) 10 MG tablet Take 10 mg by mouth at bedtime.         Physical Exam: Filed Vitals:   04/08/13 2030  BP: 130/73  Pulse: 96  Temp:   Resp: 17  A+O X3 NVI Compartments soft/NT No sob/cp abd soft/nt Significant right groin pain No laceration/abrasion   Image: Dg Chest 1 View  04/08/2013   CLINICAL DATA:  Status post fall 3 days ago.  Right hip fracture.  EXAM: CHEST - 1 VIEW  COMPARISON:  PA and lateral chest 11/11/2012.  FINDINGS: Lung volumes are lower than on the comparison exam with crowding of the bronchovascular structures and accentuation of the cardiac silhouette. Lungs are clear. No pneumothorax or pleural effusion. The patient is status post CABG.  IMPRESSION: No acute disease.   Electronically Signed   By: Marcello Moores  Dalessio M.D.   On: 04/08/2013 19:10   Dg Hip Complete Right  04/08/2013   CLINICAL DATA:  Pain  EXAM: RIGHT HIP - COMPLETE 2+ VIEW  COMPARISON:  None.  FINDINGS: A subcapital femoral neck fracture is appreciated demonstrating impaction and superior displacement. There is mild medial angulation. Atherosclerotic calcifications identified within the femoral vessels.  IMPRESSION: Subcapital femoral neck fracture.   Electronically Signed   By: Margaree Mackintosh M.D.   On: 04/08/2013 18:43    A/P: Patient s/p fall 3 days ago with inability to ambulate.  Brought to ER today by EMS.  Xrays demonstrate right femoral neck fracture.   Plan: Admit to medical team per protocal Discussed with Dr Maureen Ralphs - plan on hemiarthroplasty tomorrow if medically cleared Mechanical DVT prevention since plan is for surgery tomorrow NPO after midnight tonight

## 2013-04-08 NOTE — ED Provider Notes (Signed)
Medical screening examination/treatment/procedure(s) were conducted as a shared visit with non-physician practitioner(s) and myself.  I personally evaluated the patient during the encounter.  The patient fell 3 days ago and since that time he's had pain in his right hip. The pain has been increasing intensity and today he was unable to walk.  The patient's x-ray shows a subcapital femoral neck fracture.  Discussed the findings with the patient and his family.  Will consult the medical service regarding admission.  The family has requested Dr. Anne Fu group.  We will consult with orthopedics.  Kathalene Frames, MD 04/08/13 [phone]

## 2013-04-08 NOTE — ED Provider Notes (Signed)
CSN: [id number]     Arrival date & time 04/08/13  1736 History   First MD Initiated Contact with Patient 04/08/13 1746     Chief Complaint  Patient presents with  . Fall  . Hip Pain   (Consider location/radiation/quality/duration/timing/severity/associated sxs/prior Treatment) HPI Patient presents emergency department with right hip pain following a fall that occurred 3 days, ago.  The patient, states he is unable to bear weight Was unable to get out of bed over the last 2 days.  Patient, states he did not strike his head or get injured in any other area.  The patient denies chest pain, shortness of breath, syncope, nausea, vomiting, abdominal pain, back pain, neck pain, dizziness, or weakness.  Patient, states, that he normally walks with cane.  Patient, states, that movement and palpation make the pain, worse. Past Medical History  Diagnosis Date  . HTN (hypertension)   . Dyslipidemia   . CVA (cerebral vascular accident)   . AAA (abdominal aortic aneurysm)     Surgical repair June, 2014  . Renal artery stenosis   . Tobacco abuse   . CAD (coronary artery disease)   . Hx of CABG     2005  . Ejection fraction   . Neuropathy     Right foot  . Leg pain   . Abdominal pain   . Hyperlipidemia   . Nervousness(799.21)   . Peripheral vascular disease   . Carotid artery occlusion   . COPD (chronic obstructive pulmonary disease) 08/15/2012  . Status post abdominal aortic aneurysm repair     June, 2014   Past Surgical History  Procedure Laterality Date  . Coronary artery bypass graft  2005    x4  . Spine surgery  1977    Lumbar HNP surgery  . Aorta - bilateral femoral artery bypass graft N/A 08/09/2012    Procedure: AORTA BIFEMORAL BYPASS GRAFT;  Surgeon: Rosetta Posner, MD;  Location: [facility] OR;  Service: Vascular;  Laterality: N/A;   Family History  Problem Relation Age of Onset  . Tuberculosis Mother   . Coronary artery disease Father    History  Substance Use Topics  . Smoking  status: Former Smoker -- 0.50 packs/day for 50 years    Types: Cigarettes    Quit date: 08/05/2012  . Smokeless tobacco: Never Used  . Alcohol Use: Yes     Comment: occasionally    Review of Systems All other systems negative except as documented in the HPI. All pertinent positives and negatives as reviewed in the HPI. Allergies  Review of patient's allergies indicates no known allergies.  Home Medications   Current Outpatient Rx  Name  Route  Sig  Dispense  Refill  . aspirin EC 81 MG tablet   Oral   Take 81 mg by mouth daily.          Marland Kitchen atorvastatin (LIPITOR) 40 MG tablet   Oral   Take 40 mg by mouth daily.          . cholecalciferol (VITAMIN D) 1000 UNITS tablet   Oral   Take 1,000 Units by mouth daily.          . furosemide (LASIX) 20 MG tablet   Oral   Take 10 mg by mouth daily.          Marland Kitchen gabapentin (NEURONTIN) 300 MG capsule   Oral   Take 1 capsule (300 mg total) by mouth daily.   30 capsule   1   .  HYDROcodone-acetaminophen (NORCO/VICODIN) 5-325 MG per tablet   Oral   Take 1 tablet by mouth every 12 (twelve) hours.   60 tablet   0   . lisinopril (PRINIVIL,ZESTRIL) 20 MG tablet   Oral   Take 1 tablet (20 mg total) by mouth daily.   30 tablet   5   . oxybutynin (DITROPAN-XL) 5 MG 24 hr tablet   Oral   Take 5 mg by mouth daily.          . pioglitazone (ACTOS) 15 MG tablet   Oral   Take 7.5 mg by mouth daily.          . saxagliptin HCl (ONGLYZA) 5 MG TABS tablet   Oral   Take 5 mg by mouth daily.         . traMADol (ULTRAM) 50 MG tablet   Oral   Take 50 mg by mouth every 6 (six) hours as needed for moderate pain. For pain         . vitamin B-12 (CYANOCOBALAMIN) 100 MCG tablet   Oral   Take 50 mcg by mouth daily.          Marland Kitchen zolpidem (AMBIEN) 10 MG tablet   Oral   Take 10 mg by mouth at bedtime.           BP 136/86  Pulse 95  Temp(Src) 98 F (36.7 C) (Oral)  Resp 20  SpO2 94% Physical Exam  Nursing note and vitals  reviewed. Constitutional: He is oriented to person, place, and time. He appears well-developed and well-nourished. No distress.  HENT:  Head: Normocephalic and atraumatic.  Mouth/Throat: Oropharynx is clear and moist.  Eyes: Pupils are equal, round, and reactive to light.  Neck: Normal range of motion. Neck supple.  Cardiovascular: Normal rate, regular rhythm and normal heart sounds.  Exam reveals no gallop and no friction rub.   No murmur heard. Pulmonary/Chest: Effort normal and breath sounds normal. No respiratory distress.  Musculoskeletal:       Right hip: He exhibits decreased range of motion, decreased strength, tenderness and bony tenderness.  Neurological: He is alert and oriented to person, place, and time. He exhibits normal muscle tone. Coordination normal.  Skin: Skin is warm.    ED Course  Procedures (including critical care time) Labs Review Labs Reviewed  BASIC METABOLIC PANEL - Abnormal; Notable for the following:    Glucose, Bld 131 (*)    BUN 35 (*)    Creatinine, Ser 1.96 (*)    GFR calc non Af Amer 31 (*)    GFR calc Af Amer 36 (*)    All other components within normal limits  CBC WITH DIFFERENTIAL - Abnormal; Notable for the following:    WBC 13.9 (*)    Platelets 146 (*)    Neutrophils Relative % 79 (*)    Neutro Abs 10.9 (*)    All other components within normal limits  PROTIME-INR  TYPE AND SCREEN  ABO/RH   Imaging Review Dg Chest 1 View  04/08/2013   CLINICAL DATA:  Status post fall 3 days ago.  Right hip fracture.  EXAM: CHEST - 1 VIEW  COMPARISON:  PA and lateral chest 11/11/2012.  FINDINGS: Lung volumes are lower than on the comparison exam with crowding of the bronchovascular structures and accentuation of the cardiac silhouette. Lungs are clear. No pneumothorax or pleural effusion. The patient is status post CABG.  IMPRESSION: No acute disease.   Electronically Signed   By: Marcello Moores  Dalessio M.D.   On: 04/08/2013 19:10   Dg Hip Complete  Right  04/08/2013   CLINICAL DATA:  Pain  EXAM: RIGHT HIP - COMPLETE 2+ VIEW  COMPARISON:  None.  FINDINGS: A subcapital femoral neck fracture is appreciated demonstrating impaction and superior displacement. There is mild medial angulation. Atherosclerotic calcifications identified within the femoral vessels.  IMPRESSION: Subcapital femoral neck fracture.   Electronically Signed   By: Margaree Mackintosh M.D.   On: 04/08/2013 18:43    EKG Interpretation   None      Patient will be admitted to the hospital.  I spoke with Dr. Rolena Infante of orthopedics, and I spoke with the, [facility], who will be in to see the patient for admission MDM   1. Femoral neck fracture        Brent General, PA-C 04/09/13 [id number]

## 2013-04-08 NOTE — ED Notes (Signed)
Patient transported to X-ray 

## 2013-04-08 NOTE — H&P (Addendum)
[facility] History and Physical  DAVE MERGEN PIR:[id number] DOB: [birth year] DOA: 04/08/2013  Referring physician: EDP PCP: Jani Gravel, MD   Chief Complaint: Fall, hip pain   HPI: [name] [name] is a 80 y.o. male who suffered a mechanical fall 3 days ago (no LOC).  Injured his R hip in the fall (didnt hit head, no other injury).  Since that time has had extreme pain with attempting to walk so essentially didn't walk at all today.  Presented to ED for evaluation.  Found to have R femoral neck fracture.  Review of Systems: No chest pain, no SOB with activity.  Systems reviewed.  As above, otherwise negative  Past Medical History  Diagnosis Date  . HTN (hypertension)   . Dyslipidemia   . CVA (cerebral vascular accident)   . AAA (abdominal aortic aneurysm)     Surgical repair June, 2014  . Renal artery stenosis   . Tobacco abuse   . CAD (coronary artery disease)   . Hx of CABG     2005  . Ejection fraction   . Neuropathy     Right foot  . Leg pain   . Abdominal pain   . Hyperlipidemia   . Nervousness(799.21)   . Peripheral vascular disease   . Carotid artery occlusion   . COPD (chronic obstructive pulmonary disease) 08/15/2012  . Status post abdominal aortic aneurysm repair     June, 2014   Past Surgical History  Procedure Laterality Date  . Coronary artery bypass graft  2005    x4  . Spine surgery  1977    Lumbar HNP surgery  . Aorta - bilateral femoral artery bypass graft N/A 08/09/2012    Procedure: AORTA BIFEMORAL BYPASS GRAFT;  Surgeon: Rosetta Posner, MD;  Location: [facility];  Service: Vascular;  Laterality: N/A;   Social History:  reports that he quit smoking about 8 months ago. His smoking use included Cigarettes. He has a 25 pack-year smoking history. He has never used smokeless tobacco. He reports that he drinks alcohol. He reports that he does not use illicit drugs.  No Known Allergies  Family History  Problem Relation Age of Onset  . Tuberculosis Mother    . Coronary artery disease Father      Prior to Admission medications   Medication Sig Start Date End Date Taking? Authorizing Provider  aspirin EC 81 MG tablet Take 81 mg by mouth daily.    Yes Historical Provider, MD  atorvastatin (LIPITOR) 40 MG tablet Take 40 mg by mouth daily.    Yes Historical Provider, MD  cholecalciferol (VITAMIN D) 1000 UNITS tablet Take 1,000 Units by mouth daily.    Yes Historical Provider, MD  furosemide (LASIX) 20 MG tablet Take 10 mg by mouth daily.  08/19/12  Yes Samantha J Rhyne, PA-C  gabapentin (NEURONTIN) 300 MG capsule Take 1 capsule (300 mg total) by mouth daily. 02/04/13  Yes Rosetta Posner, MD  HYDROcodone-acetaminophen (NORCO/VICODIN) 5-325 MG per tablet Take 1 tablet by mouth every 12 (twelve) hours. 10/04/12  Yes Charlett Blake, MD  lisinopril (PRINIVIL,ZESTRIL) 20 MG tablet Take 1 tablet (20 mg total) by mouth daily. 10/18/12  Yes Carlena Bjornstad, MD  oxybutynin (DITROPAN-XL) 5 MG 24 hr tablet Take 5 mg by mouth daily.  07/01/12  Yes Historical Provider, MD  pioglitazone (ACTOS) 15 MG tablet Take 7.5 mg by mouth daily.    Yes Historical Provider, MD  saxagliptin HCl (ONGLYZA) 5 MG TABS tablet  Take 5 mg by mouth daily.   Yes Historical Provider, MD  traMADol (ULTRAM) 50 MG tablet Take 50 mg by mouth every 6 (six) hours as needed for moderate pain. For pain 10/26/10  Yes Historical Provider, MD  vitamin B-12 (CYANOCOBALAMIN) 100 MCG tablet Take 50 mcg by mouth daily.    Yes Historical Provider, MD  zolpidem (AMBIEN) 10 MG tablet Take 10 mg by mouth at bedtime.    Yes Historical Provider, MD   Physical Exam: Filed Vitals:   04/08/13 1743  BP: 136/86  Pulse: 95  Temp: 98 F (36.7 C)  Resp: 20    BP 136/86  Pulse 95  Temp(Src) 98 F (36.7 C) (Oral)  Resp 20  SpO2 94%  General Appearance:    Alert, oriented, no distress, appears stated age  Head:    Normocephalic, atraumatic  Eyes:    PERRL, EOMI, sclera non-icteric        Nose:   Nares  without drainage or epistaxis. Mucosa, turbinates normal  Throat:   Moist mucous membranes. Oropharynx without erythema or exudate.  Neck:   Supple. No carotid bruits.  No thyromegaly.  No lymphadenopathy.   Back:     No CVA tenderness, no spinal tenderness  Lungs:     Clear to auscultation bilaterally, without wheezes, rhonchi or rales  Chest wall:    No tenderness to palpitation  Heart:    Regular rate and rhythm without murmurs, gallops, rubs  Abdomen:     Soft, non-tender, nondistended, normal bowel sounds, no organomegaly  Genitalia:    deferred  Rectal:    deferred  Extremities:   No clubbing, cyanosis or edema.  Pulses:   2+ and symmetric all extremities  Skin:   Skin color, texture, turgor normal, no rashes or lesions  Lymph nodes:   Cervical, supraclavicular, and axillary nodes normal  Neurologic:   CNII-XII intact. Normal strength, sensation and reflexes      throughout    Labs on Admission:  Basic Metabolic Panel:  Recent Labs Lab 04/08/13 1910  NA 138  K 4.4  CL 102  CO2 22  GLUCOSE 131*  BUN 35*  CREATININE 1.96*  CALCIUM 9.1   Liver Function Tests: No results found for this basename: AST, ALT, ALKPHOS, BILITOT, PROT, ALBUMIN,  in the last 168 hours No results found for this basename: LIPASE, AMYLASE,  in the last 168 hours No results found for this basename: AMMONIA,  in the last 168 hours CBC:  Recent Labs Lab 04/08/13 1910  WBC 13.9*  NEUTROABS 10.9*  HGB 14.2  HCT 44.0  MCV 91.1  PLT 146*   Cardiac Enzymes: No results found for this basename: CKTOTAL, CKMB, CKMBINDEX, TROPONINI,  in the last 168 hours  BNP (last 3 results) No results found for this basename: PROBNP,  in the last 8760 hours CBG: No results found for this basename: GLUCAP,  in the last 168 hours  Radiological Exams on Admission: Dg Chest 1 View  04/08/2013   CLINICAL DATA:  Status post fall 3 days ago.  Right hip fracture.  EXAM: CHEST - 1 VIEW  COMPARISON:  PA and lateral  chest 11/11/2012.  FINDINGS: Lung volumes are lower than on the comparison exam with crowding of the bronchovascular structures and accentuation of the cardiac silhouette. Lungs are clear. No pneumothorax or pleural effusion. The patient is status post CABG.  IMPRESSION: No acute disease.   Electronically Signed   By: Inge Rise M.D.   On:  04/08/2013 19:10   Dg Hip Complete Right  04/08/2013   CLINICAL DATA:  Pain  EXAM: RIGHT HIP - COMPLETE 2+ VIEW  COMPARISON:  None.  FINDINGS: A subcapital femoral neck fracture is appreciated demonstrating impaction and superior displacement. There is mild medial angulation. Atherosclerotic calcifications identified within the femoral vessels.  IMPRESSION: Subcapital femoral neck fracture.   Electronically Signed   By: Margaree Mackintosh M.D.   On: 04/08/2013 18:43    EKG: Independently reviewed.  Ordered and pending.  Assessment/Plan Principal Problem:   Closed displaced fracture of right femoral neck Active Problems:   Peripheral vascular disease, unspecified   DM2 (diabetes mellitus, type 2)   1. Closed displaced fracture of R femoral neck - admitting to inpatient, ortho to see in AM, putting on ASA and SCDs for DVT PPX.  No recent cardiac complaints though he does have a h/o CAD with CABG in 2005.  Patient also did undergo a much larger and riskier operation: Aorto-bi fem bypass 6 months ago without evident complications. 2. DM2 - holding home meds and putting patient on insulin SSI since NPO 3. HTN - holding lasix and putting patient on 75 cc/hr NS since NPO 4. CKD - monitor renal function, slight change from previous baseline, unclear if this is due to this recent injury or just represents slow deterioration in baseline.    Code Status: Full Code  Family Communication: Family at bedside Disposition Plan: Admit to inpatient   Time spent: 70 min  Lylee Corrow M. [facility] Pager [phone]  If 7AM-7PM, please contact the day team  taking care of the patient [email or web address] Password [email or web address] 04/08/2013, 8:12 PM

## 2013-04-08 NOTE — ED Notes (Signed)
Patient fell 3 days ago and has not been able to fully bear weight since the fall and has not been out of bed for the past day and half. Patient complains of pain to right lower extremity.

## 2013-04-08 NOTE — ED Notes (Signed)
Bed: WA20 Expected date:  Expected time:  Means of arrival:  Comments: EMS 

## 2013-04-08 NOTE — Progress Notes (Signed)
   CARE MANAGEMENT ED NOTE 04/08/2013  Patient:  [name] [name], [name] [name]   Account Number:  [id number]  Date Initiated:  04/08/2013  Documentation initiated by:  Livia Snellen  Subjective/Objective Assessment:   Patient presents to Ed with fall sustaining right hip injury     Subjective/Objective Assessment Detail:   Patent with pmhx of HTN, CVA, CAD, CABG AAA repair. Patient with extreme pain with inablity to walk.  Patient afebrile.  WBC 13.9     Action/Plan:   Action/Plan Detail:   Anticipated DC Date:       Status Recommendation to Physician:   Result of Recommendation:    Other ED Services  Consult Working [place] [place]  Other    Choice offered to / List presented to:            Status of service:  Completed, signed off  ED Comments:   ED Comments Detail:  EDCM spoke to patient and his wife at bedside.  [name] (c) [phone], (h) [phone].  Patient currently does not have any home health services.  Patient has had [facility] in the past June/July 2014. Patient reports he is able to perform all ADL's himself without difficulty.  Patient has a cane and a walker at home, no other medical equipment.  EDCM provided patient with a list of home health agencies in [facility], also a list of private duty nursing.  EDCM explained to patient private duty nurse may nbe an out of pocket expense.  for him.  Patient and family thankful for resources.  No further EDCM needs at this time.

## 2013-04-09 ENCOUNTER — Encounter (HOSPITAL_COMMUNITY): Payer: Medicare Other | Admitting: Anesthesiology

## 2013-04-09 ENCOUNTER — Encounter (HOSPITAL_COMMUNITY)
Admission: EM | Disposition: A | Discharge status: Skilled Nursing Facility | Payer: Self-pay | Source: Home / Self Care | Attending: Internal Medicine

## 2013-04-09 ENCOUNTER — Encounter (HOSPITAL_BASED_OUTPATIENT_CLINIC_OR_DEPARTMENT_OTHER): Payer: Self-pay | Admitting: Registered Nurse

## 2013-04-09 ENCOUNTER — Inpatient Hospital Stay (HOSPITAL_COMMUNITY): Payer: Medicare Other

## 2013-04-09 ENCOUNTER — Inpatient Hospital Stay (HOSPITAL_COMMUNITY): Payer: Medicare Other | Admitting: Anesthesiology

## 2013-04-09 DIAGNOSIS — W19XXXA Unspecified fall, initial encounter: Secondary | ICD-10-CM

## 2013-04-09 DIAGNOSIS — N189 Chronic kidney disease, unspecified: Secondary | ICD-10-CM

## 2013-04-09 DIAGNOSIS — R338 Other retention of urine: Secondary | ICD-10-CM

## 2013-04-09 DIAGNOSIS — Y92009 Unspecified place in unspecified non-institutional (private) residence as the place of occurrence of the external cause: Secondary | ICD-10-CM

## 2013-04-09 DIAGNOSIS — N179 Acute kidney failure, unspecified: Secondary | ICD-10-CM | POA: Diagnosis present

## 2013-04-09 HISTORY — PX: HIP ARTHROPLASTY: SHX981

## 2013-04-09 HISTORY — DX: Other retention of urine: R33.8

## 2013-04-09 HISTORY — DX: Unspecified place in unspecified non-institutional (private) residence as the place of occurrence of the external cause: Y92.009

## 2013-04-09 HISTORY — DX: Unspecified fall, initial encounter: W19.XXXA

## 2013-04-09 LAB — SURGICAL PCR SCREEN
MRSA, PCR: NEGATIVE
Staphylococcus aureus: POSITIVE — AB

## 2013-04-09 LAB — GLUCOSE, CAPILLARY
GLUCOSE-CAPILLARY: 116 mg/dL — AB (ref 70–99)
GLUCOSE-CAPILLARY: 129 mg/dL — AB (ref 70–99)
Glucose-Capillary: 115 mg/dL — ABNORMAL HIGH (ref 70–99)
Glucose-Capillary: 127 mg/dL — ABNORMAL HIGH (ref 70–99)
Glucose-Capillary: 98 mg/dL (ref 70–99)

## 2013-04-09 LAB — CBC
HCT: 41.3 % (ref 39.0–52.0)
Hemoglobin: 13 g/dL (ref 13.0–17.0)
MCH: 29.4 pg (ref 26.0–34.0)
MCHC: 31.5 g/dL (ref 30.0–36.0)
MCV: 93.4 fL (ref 78.0–100.0)
PLATELETS: 144 10*3/uL — AB (ref 150–400)
RBC: 4.42 MIL/uL (ref 4.22–5.81)
RDW: 14.6 % (ref 11.5–15.5)
WBC: 10.3 10*3/uL (ref 4.0–10.5)

## 2013-04-09 LAB — BASIC METABOLIC PANEL
BUN: 36 mg/dL — ABNORMAL HIGH (ref 6–23)
CALCIUM: 9 mg/dL (ref 8.4–10.5)
CO2: 24 meq/L (ref 19–32)
Chloride: 103 mEq/L (ref 96–112)
Creatinine, Ser: 2.07 mg/dL — ABNORMAL HIGH (ref 0.50–1.35)
GFR calc Af Amer: 33 mL/min — ABNORMAL LOW (ref >90)
GFR calc non Af Amer: 29 mL/min — ABNORMAL LOW (ref >90)
GLUCOSE: 102 mg/dL — AB (ref 70–99)
Potassium: 4.5 mEq/L (ref 3.7–5.3)
SODIUM: 141 meq/L (ref 137–147)

## 2013-04-09 SURGERY — HEMIARTHROPLASTY, HIP, DIRECT ANTERIOR APPROACH, FOR FRACTURE
Anesthesia: General | Laterality: Right

## 2013-04-09 MED ORDER — PHENYLEPHRINE HCL 10 MG/ML IJ SOLN
INTRAMUSCULAR | Status: DC | PRN
  Administered 2013-04-09 (×3): 80 ug via INTRAVENOUS

## 2013-04-09 MED ORDER — METOCLOPRAMIDE HCL 10 MG PO TABS: 5.0000 mg | ORAL_TABLET | Freq: Three times a day (TID) | ORAL | Status: DC | PRN

## 2013-04-09 MED ORDER — HYDROMORPHONE HCL PF 1 MG/ML IJ SOLN
0.2500 mg | INTRAMUSCULAR | Status: DC | PRN
  Administered 2013-04-09 (×6): 0.25 mg via INTRAVENOUS

## 2013-04-09 MED ORDER — METHOCARBAMOL 500 MG PO TABS
500.0000 mg | ORAL_TABLET | Freq: Four times a day (QID) | ORAL | Status: DC | PRN
  Administered 2013-04-10 – 2013-04-11 (×2): 500 mg via ORAL
  Filled 2013-04-09 (×2): qty 1

## 2013-04-09 MED ORDER — SODIUM CHLORIDE 0.9 % IJ SOLN
INTRAMUSCULAR | Status: DC | PRN
  Administered 2013-04-09: 19:00:00

## 2013-04-09 MED ORDER — PROPOFOL 10 MG/ML IV BOLUS
INTRAVENOUS | Status: AC
  Filled 2013-04-09: qty 20

## 2013-04-09 MED ORDER — ONDANSETRON HCL 4 MG PO TABS: 4.0000 mg | ORAL_TABLET | Freq: Four times a day (QID) | ORAL | Status: DC | PRN

## 2013-04-09 MED ORDER — SODIUM CHLORIDE 0.9 % IJ SOLN: INTRAMUSCULAR | Status: DC | PRN

## 2013-04-09 MED ORDER — BUPIVACAINE HCL (PF) 0.25 % IJ SOLN
INTRAMUSCULAR | Status: AC
  Filled 2013-04-09: qty 30

## 2013-04-09 MED ORDER — LIDOCAINE HCL (CARDIAC) 20 MG/ML IV SOLN
INTRAVENOUS | Status: AC
  Filled 2013-04-09: qty 5

## 2013-04-09 MED ORDER — ACETAMINOPHEN 650 MG RE SUPP
650.0000 mg | Freq: Four times a day (QID) | RECTAL | Status: DC | PRN
Start: 2013-04-09 — End: 2013-04-12

## 2013-04-09 MED ORDER — LIDOCAINE HCL (CARDIAC) 20 MG/ML IV SOLN
INTRAVENOUS | Status: DC | PRN
  Administered 2013-04-09: 80 mg via INTRAVENOUS

## 2013-04-09 MED ORDER — MENTHOL 3 MG MT LOZG
1.0000 | LOZENGE | OROMUCOSAL | Status: DC | PRN
  Filled 2013-04-09: qty 9

## 2013-04-09 MED ORDER — CEFAZOLIN SODIUM-DEXTROSE 2-3 GM-% IV SOLR
INTRAVENOUS | Status: AC
Start: 2013-04-09 — End: 2013-04-09
  Filled 2013-04-09: qty 50

## 2013-04-09 MED ORDER — LACTATED RINGERS IV SOLN
INTRAVENOUS | Status: DC | PRN
  Administered 2013-04-09: 17:00:00 via INTRAVENOUS

## 2013-04-09 MED ORDER — PROPOFOL 10 MG/ML IV BOLUS
INTRAVENOUS | Status: DC | PRN
  Administered 2013-04-09: 170 mg via INTRAVENOUS

## 2013-04-09 MED ORDER — BUPIVACAINE LIPOSOME 1.3 % IJ SUSP
20.0000 mL | Freq: Once | INTRAMUSCULAR | Status: DC
  Filled 2013-04-09: qty 20

## 2013-04-09 MED ORDER — BUPIVACAINE HCL 0.25 % IJ SOLN
INTRAMUSCULAR | Status: DC | PRN
  Administered 2013-04-09: 20 mL

## 2013-04-09 MED ORDER — GLYCOPYRROLATE 0.2 MG/ML IJ SOLN
INTRAMUSCULAR | Status: AC
  Filled 2013-04-09: qty 3

## 2013-04-09 MED ORDER — HYDROMORPHONE HCL PF 1 MG/ML IJ SOLN
INTRAMUSCULAR | Status: AC
  Filled 2013-04-09: qty 1

## 2013-04-09 MED ORDER — PHENYLEPHRINE 40 MCG/ML (10ML) SYRINGE FOR IV PUSH (FOR BLOOD PRESSURE SUPPORT)
PREFILLED_SYRINGE | INTRAVENOUS | Status: AC
Start: 2013-04-09 — End: 2013-04-09
  Filled 2013-04-09: qty 10

## 2013-04-09 MED ORDER — NEOSTIGMINE METHYLSULFATE 1 MG/ML IJ SOLN
INTRAMUSCULAR | Status: DC | PRN
  Administered 2013-04-09: 4 mg via INTRAVENOUS

## 2013-04-09 MED ORDER — ACETAMINOPHEN 325 MG PO TABS: 650.0000 mg | ORAL_TABLET | Freq: Four times a day (QID) | ORAL | Status: DC | PRN

## 2013-04-09 MED ORDER — ROCURONIUM BROMIDE 100 MG/10ML IV SOLN
INTRAVENOUS | Status: DC | PRN
  Administered 2013-04-09: 20 mg via INTRAVENOUS

## 2013-04-09 MED ORDER — BUPIVACAINE HCL 0.25 % IJ SOLN: INTRAMUSCULAR | Status: DC | PRN

## 2013-04-09 MED ORDER — ONDANSETRON HCL 4 MG/2ML IJ SOLN
INTRAMUSCULAR | Status: DC | PRN
  Administered 2013-04-09: 4 mg via INTRAVENOUS

## 2013-04-09 MED ORDER — MORPHINE SULFATE 2 MG/ML IJ SOLN
1.0000 mg | INTRAMUSCULAR | Status: DC | PRN
  Administered 2013-04-10: 1 mg via INTRAVENOUS
  Filled 2013-04-09: qty 1

## 2013-04-09 MED ORDER — OXYCODONE HCL 5 MG/5ML PO SOLN
5.0000 mg | Freq: Once | ORAL | Status: DC | PRN
  Filled 2013-04-09: qty 5

## 2013-04-09 MED ORDER — SUCCINYLCHOLINE CHLORIDE 20 MG/ML IJ SOLN
INTRAMUSCULAR | Status: DC | PRN
  Administered 2013-04-09: 100 mg via INTRAVENOUS

## 2013-04-09 MED ORDER — ATROPINE SULFATE 0.4 MG/ML IJ SOLN
INTRAMUSCULAR | Status: AC
  Filled 2013-04-09: qty 2

## 2013-04-09 MED ORDER — SODIUM CHLORIDE 0.9 % IJ SOLN
INTRAMUSCULAR | Status: AC
  Filled 2013-04-09: qty 50

## 2013-04-09 MED ORDER — FENTANYL CITRATE 0.05 MG/ML IJ SOLN
INTRAMUSCULAR | Status: AC
  Filled 2013-04-09: qty 2

## 2013-04-09 MED ORDER — ONDANSETRON HCL 4 MG/2ML IJ SOLN: 4.0000 mg | Freq: Four times a day (QID) | INTRAMUSCULAR | Status: DC | PRN

## 2013-04-09 MED ORDER — BISACODYL 10 MG RE SUPP
10.0000 mg | Freq: Every day | RECTAL | Status: DC | PRN
  Administered 2013-04-12: 10 mg via RECTAL
  Filled 2013-04-09: qty 1

## 2013-04-09 MED ORDER — METOCLOPRAMIDE HCL 5 MG/ML IJ SOLN: 5.0000 mg | Freq: Three times a day (TID) | INTRAMUSCULAR | Status: DC | PRN

## 2013-04-09 MED ORDER — ONDANSETRON HCL 4 MG/2ML IJ SOLN
INTRAMUSCULAR | Status: AC
  Filled 2013-04-09: qty 2

## 2013-04-09 MED ORDER — LIP MEDEX EX OINT
TOPICAL_OINTMENT | CUTANEOUS | Status: AC
  Administered 2013-04-09: 1
  Filled 2013-04-09: qty 7

## 2013-04-09 MED ORDER — HYDRALAZINE HCL 20 MG/ML IJ SOLN
10.0000 mg | Freq: Four times a day (QID) | INTRAMUSCULAR | Status: DC | PRN
  Filled 2013-04-09: qty 1

## 2013-04-09 MED ORDER — DOCUSATE SODIUM 100 MG PO CAPS
100.0000 mg | ORAL_CAPSULE | Freq: Two times a day (BID) | ORAL | Status: DC
  Administered 2013-04-10 – 2013-04-12 (×5): 100 mg via ORAL

## 2013-04-09 MED ORDER — FENTANYL CITRATE 0.05 MG/ML IJ SOLN
INTRAMUSCULAR | Status: DC | PRN
  Administered 2013-04-09 (×4): 50 ug via INTRAVENOUS

## 2013-04-09 MED ORDER — POLYETHYLENE GLYCOL 3350 17 G PO PACK
17.0000 g | PACK | Freq: Every day | ORAL | Status: DC | PRN
  Administered 2013-04-12: 17 g via ORAL

## 2013-04-09 MED ORDER — ENOXAPARIN SODIUM 40 MG/0.4ML ~~LOC~~ SOLN
40.0000 mg | SUBCUTANEOUS | Status: DC
  Administered 2013-04-10 – 2013-04-12 (×3): 40 mg via SUBCUTANEOUS
  Filled 2013-04-09 (×4): qty 0.4

## 2013-04-09 MED ORDER — PHENOL 1.4 % MT LIQD: 1.0000 | OROMUCOSAL | Status: DC | PRN

## 2013-04-09 MED ORDER — OXYCODONE HCL 5 MG PO TABS: 5.0000 mg | ORAL_TABLET | Freq: Once | ORAL | Status: DC | PRN

## 2013-04-09 MED ORDER — METHOCARBAMOL 100 MG/ML IJ SOLN
500.0000 mg | Freq: Four times a day (QID) | INTRAVENOUS | Status: DC | PRN
  Administered 2013-04-09: 500 mg via INTRAVENOUS
  Filled 2013-04-09: qty 5

## 2013-04-09 MED ORDER — CEFAZOLIN SODIUM-DEXTROSE 2-3 GM-% IV SOLR
2.0000 g | Freq: Four times a day (QID) | INTRAVENOUS | Status: AC
  Administered 2013-04-09 – 2013-04-10 (×2): 2 g via INTRAVENOUS
  Filled 2013-04-09 (×2): qty 50

## 2013-04-09 MED ORDER — ESMOLOL HCL 10 MG/ML IV SOLN
INTRAVENOUS | Status: DC | PRN
  Administered 2013-04-09: 20 mg via INTRAVENOUS

## 2013-04-09 MED ORDER — PROMETHAZINE HCL 25 MG/ML IJ SOLN: 6.2500 mg | INTRAMUSCULAR | Status: DC | PRN

## 2013-04-09 MED ORDER — EPHEDRINE SULFATE 50 MG/ML IJ SOLN
INTRAMUSCULAR | Status: DC | PRN
  Administered 2013-04-09 (×2): 5 mg via INTRAVENOUS
  Administered 2013-04-09: 10 mg via INTRAVENOUS
  Administered 2013-04-09 (×2): 5 mg via INTRAVENOUS

## 2013-04-09 MED ORDER — FLEET ENEMA 7-19 GM/118ML RE ENEM: 1.0000 | ENEMA | Freq: Once | RECTAL | Status: AC | PRN

## 2013-04-09 MED ORDER — GLYCOPYRROLATE 0.2 MG/ML IJ SOLN
INTRAMUSCULAR | Status: DC | PRN
  Administered 2013-04-09: .6 mg via INTRAVENOUS

## 2013-04-09 MED ORDER — ACETAMINOPHEN 10 MG/ML IV SOLN
1000.0000 mg | Freq: Once | INTRAVENOUS | Status: AC
  Administered 2013-04-09: 1000 mg via INTRAVENOUS
  Filled 2013-04-09: qty 100

## 2013-04-09 MED ORDER — MEPERIDINE HCL 50 MG/ML IJ SOLN: 6.2500 mg | INTRAMUSCULAR | Status: DC | PRN

## 2013-04-09 SURGICAL SUPPLY — 57 items
BAG ZIPLOCK 12X15 (MISCELLANEOUS) ×3 IMPLANT
BIT DRILL 2.8X128 (BIT) ×2 IMPLANT
BIT DRILL 2.8X128MM (BIT) ×1
BLADE SAW SAG 73X25 THK (BLADE) ×2
BLADE SAW SGTL 73X25 THK (BLADE) ×1 IMPLANT
BRUSH FEMORAL CANAL (MISCELLANEOUS) ×3 IMPLANT
CAPT HIP FX BIPOLAR/UNIPOLAR ×3 IMPLANT
CEMENT BONE DEPUY (Cement) ×6 IMPLANT
CEMENT RESTRICTOR DEPUY SZ 3 (Cement) ×3 IMPLANT
CEMENT RESTRICTOR DEPUY SZ 4 (Cement) ×3 IMPLANT
CLOSURE STERI-STRIP 1/4X4 (GAUZE/BANDAGES/DRESSINGS) ×6 IMPLANT
CLOSURE WOUND 1/2 X4 (GAUZE/BANDAGES/DRESSINGS) ×1
DRAPE INCISE IOBAN 66X45 STRL (DRAPES) ×3 IMPLANT
DRAPE ORTHO SPLIT 77X108 STRL (DRAPES) ×4
DRAPE POUCH INSTRU U-SHP 10X18 (DRAPES) ×3 IMPLANT
DRAPE SURG ORHT 6 SPLT 77X108 (DRAPES) ×2 IMPLANT
DRAPE U-SHAPE 47X51 STRL (DRAPES) ×3 IMPLANT
DRSG ADAPTIC 3X8 NADH LF (GAUZE/BANDAGES/DRESSINGS) ×3 IMPLANT
DRSG EMULSION OIL 3X16 NADH (GAUZE/BANDAGES/DRESSINGS) ×3 IMPLANT
DRSG MEPILEX BORDER 4X4 (GAUZE/BANDAGES/DRESSINGS) ×3 IMPLANT
DRSG MEPILEX BORDER 4X8 (GAUZE/BANDAGES/DRESSINGS) ×3 IMPLANT
DURAPREP 26ML APPLICATOR (WOUND CARE) ×3 IMPLANT
ELECT BLADE 6.5 EXT (BLADE) ×3 IMPLANT
ELECT REM PT RETURN 9FT ADLT (ELECTROSURGICAL) ×3
ELECTRODE REM PT RTRN 9FT ADLT (ELECTROSURGICAL) ×1 IMPLANT
EVACUATOR 1/8 PVC DRAIN (DRAIN) ×3 IMPLANT
FACESHIELD LNG OPTICON STERILE (SAFETY) ×9 IMPLANT
GLOVE BIO SURGEON STRL SZ7.5 (GLOVE) ×3 IMPLANT
GLOVE BIO SURGEON STRL SZ8 (GLOVE) ×6 IMPLANT
GLOVE BIOGEL PI IND STRL 8 (GLOVE) ×1 IMPLANT
GLOVE BIOGEL PI INDICATOR 8 (GLOVE) ×2
GOWN STRL REUS W/TWL LRG LVL3 (GOWN DISPOSABLE) ×3 IMPLANT
GOWN STRL REUS W/TWL XL LVL3 (GOWN DISPOSABLE) ×3 IMPLANT
HANDPIECE INTERPULSE COAX TIP (DISPOSABLE) ×2
IMMOBILIZER KNEE 20 (SOFTGOODS) IMPLANT
KIT BASIN OR (CUSTOM PROCEDURE TRAY) ×3 IMPLANT
MANIFOLD NEPTUNE II (INSTRUMENTS) ×3 IMPLANT
NDL SAFETY ECLIPSE 18X1.5 (NEEDLE) ×2 IMPLANT
NEEDLE HYPO 18GX1.5 SHARP (NEEDLE) ×4
PACK TOTAL JOINT (CUSTOM PROCEDURE TRAY) ×3 IMPLANT
PAD ABD 8X10 STRL (GAUZE/BANDAGES/DRESSINGS) ×3 IMPLANT
PASSER SUT SWANSON 36MM LOOP (INSTRUMENTS) ×3 IMPLANT
POSITIONER SURGICAL ARM (MISCELLANEOUS) ×3 IMPLANT
PRESSURIZER FEMORAL UNIV (MISCELLANEOUS) ×3 IMPLANT
SET HNDPC FAN SPRY TIP SCT (DISPOSABLE) ×1 IMPLANT
SPONGE GAUZE 4X4 12PLY (GAUZE/BANDAGES/DRESSINGS) ×3 IMPLANT
STRIP CLOSURE SKIN 1/2X4 (GAUZE/BANDAGES/DRESSINGS) ×2 IMPLANT
SUT ETHIBOND NAB CT1 #1 30IN (SUTURE) ×6 IMPLANT
SUT MNCRL AB 4-0 PS2 18 (SUTURE) ×3 IMPLANT
SUT VIC AB 1 CT1 27 (SUTURE)
SUT VIC AB 1 CT1 27XBRD ANTBC (SUTURE) IMPLANT
SUT VIC AB 2-0 CT1 27 (SUTURE) ×6
SUT VIC AB 2-0 CT1 TAPERPNT 27 (SUTURE) ×3 IMPLANT
SUT VLOC 180 0 24IN GS25 (SUTURE) ×6 IMPLANT
SYR 20CC LL (SYRINGE) ×6 IMPLANT
TOWEL OR 17X26 10 PK STRL BLUE (TOWEL DISPOSABLE) ×6 IMPLANT
TOWER CARTRIDGE SMART MIX (DISPOSABLE) ×3 IMPLANT

## 2013-04-09 NOTE — Progress Notes (Signed)
Subjective: Patient complains of right hip pain. Fell approximately 3 days ago and developed hip pain which got progressively worse leading to trip to ED yesterday. Found to have displaced right femoral neck fracture. Only complaint is right hip pain   Objective: Vital signs in last 24 hours: Temp:  [98 F (36.7 C)-98.4 F (36.9 C)] 98.2 F (36.8 C) (02/04 0534) Pulse Rate:  [84-100] 100 (02/04 0534) Resp:  [16-20] 16 (02/04 0534) BP: (127-157)/(73-87) 157/85 mmHg (02/04 0534) SpO2:  [93 %-95 %] 93 % (02/04 0534)  Intake/Output from previous day: 02/03 0701 - 02/04 0700 In: 565 [I.V.:565] Out: -  Intake/Output this shift:     Recent Labs  04/08/13 1910  HGB 14.2    Recent Labs  04/08/13 1910  WBC 13.9*  RBC 4.83  HCT 44.0  PLT 146*    Recent Labs  04/08/13 1910  NA 138  K 4.4  CL 102  CO2 22  BUN 35*  CREATININE 1.96*  GLUCOSE 131*  CALCIUM 9.1    Recent Labs  04/08/13 1910  INR 0.98    Neurologically intact Neurovascular intact Right leg shortened and externally rotated. Pulses non-palpable but feet warm and pink  Assessment/Plan: Right femoral neck fracture- Plan right hip hemiarthroplasty. Discussed procedure, risks, and potential complications and he elects to proceed.   [name] [name] 04/09/2013, 8:04 AM

## 2013-04-09 NOTE — Progress Notes (Signed)
INITIAL NUTRITION ASSESSMENT  DOCUMENTATION CODES Per approved criteria  -Not Applicable   INTERVENTION: - Diet advancement per MD - Encouraged high calorie/protein diet after surgery to promote strength building - Will continue to monitor   NUTRITION DIAGNOSIS: Inadequate oral intake related to inability to eat as evidenced by NPO.   Goal: Advance diet as tolerated to regular diet  Monitor:  Weights, labs, diet advancement  Reason for Assessment: Consult for hip fracture protocol   80 y.o. male  Admitting Dx: Closed displaced fracture of right femoral neck  ASSESSMENT: Pt suffered a mechanical fall 3 days ago (no LOC). Injured his R hip in the fall (didn't hit head, no other injury). Since that time has had extreme pain with attempting to walk so essentially didn't walk at all yesterday. Presented to ED for evaluation. Found to have R femoral neck fracture. Plan is to have right hip hemiarthroplasty today.   Met with pt who reports poor appetite for the past week with pt consuming only 1 meal/day, typically like a sandwich and chips. Before then he was eating 2-3 meals/day that were well balanced per pt's report. Reports weighing 200 pounds and denies any changes in weight. Denies any problems chewing or swallowing other than occasional dysphagia from stroke 15 years ago. Reports if he chews his food well he does not have any problems swallowing. Pt declined nutrition focused physical exam at this time as he wanted to sleep before surgery.   BUN/Cr elevated with low GFR   Height: Ht Readings from Last 1 Encounters:  11/05/12 _0  (1.778 m)    Weight: 04/09/13             200 lb per pt report  Ideal Body Weight: 166 lb  % Ideal Body Weight: 116%  Wt Readings from Last 10 Encounters:  11/05/12 193 lb 14.4 oz (87.952 kg)  09/09/12 195 lb (88.451 kg)  08/27/12 200 lb 12.8 oz (91.082 kg)  08/23/12 206 lb 5.6 oz (93.6 kg)  08/19/12 213 lb 13.5 oz (97 kg)  08/19/12 213  lb 13.5 oz (97 kg)  08/07/12 200 lb (90.719 kg)  08/07/12 200 lb (90.719 kg)  02/07/12 200 lb (90.719 kg)  08/28/11 201 lb (91.173 kg)    Usual Body Weight: 200 lb  % Usual Body Weight: 100%  BMI:  28.7 kg/(m^2)  Estimated Nutritional Needs: Kcal: 1900-2100 Protein: 90-110g Fluid: 1.9-2.1L/day  Skin: Intact  Diet Order: NPO  EDUCATION NEEDS: -Education needs addressed - educated pt on high calorie/protein diet to promote strength building after surgery    Intake/Output Summary (Last 24 hours) at 04/09/13 1353 Last data filed at 04/09/13 1330  Gross per 24 hour  Intake    565 ml  Output    750 ml  Net   -185 ml    Last BM: 2/1  Labs:   Recent Labs Lab 04/08/13 1910  NA 138  K 4.4  CL 102  CO2 22  BUN 35*  CREATININE 1.96*  CALCIUM 9.1  GLUCOSE 131*    CBG (last 3)   Recent Labs  04/09/13 0430 04/09/13 0737 04/09/13 1257  GLUCAP 115* 116* 98    Scheduled Meds: . aspirin EC  325 mg Oral Daily  . atorvastatin  40 mg Oral Daily  .  ceFAZolin (ANCEF) IV  2 g Intravenous Once  . gabapentin  300 mg Oral Daily  . influenza vac split quadrivalent PF  0.5 mL Intramuscular Tomorrow-1000  . insulin aspart  0-9  Units Subcutaneous Q4H  . lisinopril  20 mg Oral Daily  . oxybutynin  5 mg Oral Daily  . vitamin B-12  50 mcg Oral Daily    Continuous Infusions: . sodium chloride 75 mL/hr (04/09/13 9675)    Past Medical History  Diagnosis Date  . HTN (hypertension)   . Dyslipidemia   . CVA (cerebral vascular accident)   . AAA (abdominal aortic aneurysm)     Surgical repair June, 2014  . Renal artery stenosis   . Tobacco abuse   . CAD (coronary artery disease)   . Hx of CABG     2005  . Ejection fraction   . Neuropathy     Right foot  . Leg pain   . Abdominal pain   . Hyperlipidemia   . Nervousness(799.21)   . Peripheral vascular disease   . Carotid artery occlusion   . COPD (chronic obstructive pulmonary disease) 08/15/2012  . Status post  abdominal aortic aneurysm repair     June, 2014    Past Surgical History  Procedure Laterality Date  . Coronary artery bypass graft  2005    x4  . Spine surgery  1977    Lumbar HNP surgery  . Aorta - bilateral femoral artery bypass graft N/A 08/09/2012    Procedure: AORTA BIFEMORAL BYPASS GRAFT;  Surgeon: Rosetta Posner, MD;  Location: [facility];  Service: Vascular;  Laterality: N/A;    Mikey College MS, [place], [place] Pager [phone] After Hours Pager

## 2013-04-09 NOTE — Anesthesia Preprocedure Evaluation (Addendum)
Anesthesia Evaluation  Patient identified by MRN, date of birth, ID band Patient awake    Reviewed: Allergy & Precautions, H&P , NPO status , Patient's Chart, lab work & pertinent test results  Airway Mallampati: I TM Distance: >3 FB Neck ROM: Full    Dental   Pulmonary shortness of breath, COPDformer smoker,          Cardiovascular hypertension, Pt. on medications + CAD, + Past MI, + CABG and + Peripheral Vascular Disease + dysrhythmias Rhythm:Regular     Neuro/Psych CVA    GI/Hepatic   Endo/Other  diabetes, Type 2  Renal/GU CRFRenal disease     Musculoskeletal   Abdominal   Peds  Hematology   Anesthesia Other Findings   Reproductive/Obstetrics                          Anesthesia Physical  Anesthesia Plan  ASA: III  Anesthesia Plan: General   Post-op Pain Management:    Induction: Intravenous  Airway Management Planned: Oral ETT  Additional Equipment:   Intra-op Plan:   Post-operative Plan: Extubation in OR  Informed Consent: I have reviewed the patients History and Physical, chart, labs and discussed the procedure including the risks, benefits and alternatives for the proposed anesthesia with the patient or authorized representative who has indicated his/her understanding and acceptance.   Dental advisory given  Plan Discussed with: CRNA  Anesthesia Plan Comments:         Anesthesia Quick Evaluation

## 2013-04-09 NOTE — Progress Notes (Signed)
Dr Glennon Mac aware hr 110-118, BP 156/94-MD at bedside, aware not on telemetry floor.  No new orders

## 2013-04-09 NOTE — Progress Notes (Signed)
PROGRESS NOTE    [name] [name] GYJ:[id number] DOB: 01-Feb-1932 DOA: 04/08/2013 PCP: Jani Gravel, MD  HPI/Brief narrative 80 year old male with history of CAD, CABG 2005, urgent aortobifemoral bypass in June 2014 for critical ischemia of right lower extremity, AAA, PAD HTN, dyslipidemia, CVA, former smoker, COPD, sustained a mechanical fall at home 3 days ago injuring his right hip. Since then, hip pain progressively gotten worse leading to ED visit on 04/08/13 which revealed displaced right femoral neck fracture.  Assessment/Plan:  Right hip/femoral neck fracture - Orthopedic consultation and followup appreciated. Plan for right hip hemiarthroplasty on 04/09/13 between 5-6 PM. Pain adequately controlled. - Patient has history of CAD, status post CABG, AAA repair June 2014, nuclear stress test 2013 revealed fixed defects but no marked ischemia, EF said to be normal and patient tolerated AAA surgery. He is not physically very active but denies any history of chest pain, dyspnea, orthopnea or PND. No known history of CHF. EKG without acute changes. Patient is at moderate risk for perioperative CV events from advanced age, CAD, hypertension and COPD. These are however stable and patient may proceed for indicated surgery without any further cardiac workup. Discussed with patient's primary cardiologist Dr. Ron Parker who agrees.  Type II DM with PAD, peripheral neuropathy and renal complications - Oral hypoglycemics held. Continue NovoLog SSI. Reasonable inpatient control.  Hypertension - Reasonable inpatient control. Hold ACE inhibitor secondary to rising creatinine. Consider low dose amlodipine postop , in interim. When necessary IV hydralazine for now.  Acute on stage III chronic kidney disease - Baseline creatinine probably in the 1.5-1.6 range. Current elevated creatinine may be secondary to urinary retention, dehydration in the context of ACE inhibitors. We'll also check CK. Continue IV fluids. Hold  ACE inhibitors temporarily and follow BMP closely. Continue Foley catheter.  Urinary retention - Patient was unable to void overnight and attempts x2 To Pl., Foley catheter were unsuccessful. Urology consulted and have placed a Foley catheter.  CAD status post CABG - Asymptomatic.     Code Status: Full Family Communication: None at bedside Disposition Plan: To be determined    Consultants:  Orthopedics  Urology  Procedures:  Foley catheter  Antibiotics:  Cefazolin-preop   Subjective: Feels much better after a Foley catheter placement. Right hip pain-mostly with movement of right lower extremity. No chest pain, dyspnea or palpitations  Objective: Filed Vitals:   04/09/13 0200 04/09/13 0237 04/09/13 0534 04/09/13 1330  BP:  127/75 157/85 135/83  Pulse:  84 100 77  Temp:  98.2 F (36.8 C) 98.2 F (36.8 C) 98.3 F (36.8 C)  TempSrc:  Oral Oral   Resp:  16 16 16   SpO2: 94% 94% 93% 93%    Intake/Output Summary (Last 24 hours) at 04/09/13 1454 Last data filed at 04/09/13 1330  Gross per 24 hour  Intake    565 ml  Output    750 ml  Net   -185 ml   There were no vitals filed for this visit.   Exam:  General exam: Moderately built and nourished pleasant elderly male lying comfortably supine in bed. Respiratory system: Clear. No increased work of breathing. Cardiovascular system: S1 & S2 heard, RRR. No JVD, murmurs, gallops, clicks or pedal edema. Gastrointestinal system: Abdomen is nondistended, soft and nontender. Normal bowel sounds heard. Central nervous system: Alert and oriented. No focal neurological deficits. Extremities: Right lower extremity shortened and externally rotated. Symmetrical normal peripheral pulses felt.   Data Reviewed: Basic Metabolic Panel:  Recent Labs  Lab 04/08/13 1910 04/09/13 1401  NA 138 141  K 4.4 4.5  CL 102 103  CO2 22 24  GLUCOSE 131* 102*  BUN 35* 36*  CREATININE 1.96* 2.07*  CALCIUM 9.1 9.0   Liver Function  Tests: No results found for this basename: AST, ALT, ALKPHOS, BILITOT, PROT, ALBUMIN,  in the last 168 hours No results found for this basename: LIPASE, AMYLASE,  in the last 168 hours No results found for this basename: AMMONIA,  in the last 168 hours CBC:  Recent Labs Lab 04/08/13 1910 04/09/13 1401  WBC 13.9* 10.3  NEUTROABS 10.9*  --   HGB 14.2 13.0  HCT 44.0 41.3  MCV 91.1 93.4  PLT 146* 144*   Cardiac Enzymes: No results found for this basename: CKTOTAL, CKMB, CKMBINDEX, TROPONINI,  in the last 168 hours BNP (last 3 results) No results found for this basename: PROBNP,  in the last 8760 hours CBG:  Recent Labs Lab 04/08/13 2054 04/09/13 0003 04/09/13 0430 04/09/13 0737 04/09/13 1257  GLUCAP 121* 129* 115* 116* 98    No results found for this or any previous visit (from the past 240 hour(s)).   EKG 04/09/13: Sinus rhythm at 89 beats per minute with first-degree AV block, occasional PVCs, LAD (not new) & no acute changes  Studies: Dg Chest 1 View  04/08/2013   CLINICAL DATA:  Status post fall 3 days ago.  Right hip fracture.  EXAM: CHEST - 1 VIEW  COMPARISON:  PA and lateral chest 11/11/2012.  FINDINGS: Lung volumes are lower than on the comparison exam with crowding of the bronchovascular structures and accentuation of the cardiac silhouette. Lungs are clear. No pneumothorax or pleural effusion. The patient is status post CABG.  IMPRESSION: No acute disease.   Electronically Signed   By: Inge Rise M.D.   On: 04/08/2013 19:10   Dg Hip Complete Right  04/08/2013   CLINICAL DATA:  Pain  EXAM: RIGHT HIP - COMPLETE 2+ VIEW  COMPARISON:  None.  FINDINGS: A subcapital femoral neck fracture is appreciated demonstrating impaction and superior displacement. There is mild medial angulation. Atherosclerotic calcifications identified within the femoral vessels.  IMPRESSION: Subcapital femoral neck fracture.   Electronically Signed   By: Margaree Mackintosh M.D.   On: 04/08/2013 18:43          Scheduled Meds: . aspirin EC  325 mg Oral Daily  . atorvastatin  40 mg Oral Daily  .  ceFAZolin (ANCEF) IV  2 g Intravenous Once  . gabapentin  300 mg Oral Daily  . influenza vac split quadrivalent PF  0.5 mL Intramuscular Tomorrow-1000  . insulin aspart  0-9 Units Subcutaneous Q4H  . lisinopril  20 mg Oral Daily  . oxybutynin  5 mg Oral Daily  . vitamin B-12  50 mcg Oral Daily   Continuous Infusions: . sodium chloride 75 mL/hr (04/09/13 0918)    Principal Problem:   Closed displaced fracture of right femoral neck Active Problems:   Peripheral vascular disease, unspecified   DM2 (diabetes mellitus, type 2)    Time spent: 14 minutes    Larkin Alfred, MD, FACP, FHM. [facility] Pager [phone]  If 7PM-7AM, please contact night-coverage [email or web address] Password [email or web address] 04/09/2013, 2:54 PM    LOS: 1 day

## 2013-04-09 NOTE — Progress Notes (Signed)
Patient unable to void lying down. Attempted to place 23 French Foley catheter, resistance met, catheter removed.  Dr. Olen Pel informed.  Will continue to monitor.

## 2013-04-09 NOTE — Interval H&P Note (Signed)
History and Physical Interval Note:  04/09/2013 5:47 PM  [name] [name]  has presented today for surgery, with the diagnosis of right hip fracture  The various methods of treatment have been discussed with the patient and family. After consideration of risks, benefits and other options for treatment, the patient has consented to  Procedure(s): ARTHROPLASTY BIPOLAR HIP (Right) as a surgical intervention .  The patient's history has been reviewed, patient examined, no change in status, stable for surgery.  I have reviewed the patient's chart and labs.  Questions were answered to the patient's satisfaction.     Gearlean Alf

## 2013-04-09 NOTE — ED Provider Notes (Signed)
Medical screening examination/treatment/procedure(s) were performed by non-physician practitioner and as supervising physician I was immediately available for consultation/collaboration.    Kathalene Frames, MD 04/09/13 [phone]

## 2013-04-09 NOTE — Progress Notes (Signed)
Clinical Social Work Department BRIEF PSYCHOSOCIAL ASSESSMENT 04/09/2013  Patient:  [name] [name], [name] [name]     Account Number:  [id number]     Admit date:  04/08/2013  Clinical Social Worker:  Lacie Scotts  Date/Time:  04/09/2013 08:49 AM  Referred by:  Physician  Date Referred:  04/09/2013 Referred for  SNF Placement   Other Referral:   Interview type:  Patient Other interview type:    PSYCHOSOCIAL DATA Living Status:  WIFE Admitted from facility:   Level of care:   Primary support name:  Eulah Citizen Primary support relationship to patient:  SPOUSE Degree of support available:   supportive    CURRENT CONCERNS Current Concerns  Post-Acute Placement   Other Concerns:    SOCIAL WORK ASSESSMENT / PLAN Pt is a 80 yr old gentleman living at home prior to hospitalization. Pt admitted on 2/3 with a hip fx resulting from a fall at home. Pt is scheduled for surgery today. CSW met with pt / spouse to assist with d/c planning. ST Rehab will most likely be needed following hospital d/c. CSW will initiate SNF search and provide bed offers as available.   Assessment/plan status:  Psychosocial Support/Ongoing Assessment of Needs Other assessment/ plan:   Information/referral to community resources:   SNF list with bed offers to be provided.    PATIENT'S/FAMILY'S RESPONSE TO PLAN OF CARE: " My husband will need rehab before going home. " CSW plans to meet with pt/ spouse to review bed offers and answer questions relating to placement.   Werner Lean LCSW [phone]

## 2013-04-09 NOTE — Anesthesia Postprocedure Evaluation (Signed)
  Anesthesia Post-op Note  Patient: [name] [name]  Procedure(s) Performed: Procedure(s): ARTHROPLASTY BIPOLAR HIP (Right) Patient is awake,alert and responsive. Pain and nausea are reasonably well controlled. Vital signs are stable and clinically acceptable. HR stable around 110. Oxygen saturation is clinically acceptable. There are no apparent anesthetic complications at this time. Patient is ready for discharge.

## 2013-04-09 NOTE — Op Note (Signed)
OPERATIVE REPORT   PREOPERATIVE DIAGNOSIS:  Right femoral neck fracture displaced.   POSTOPERATIVE DIAGNOSIS:  Right femoral neck fracture displaced.   PROCEDURE:  Right hip hemiarthroplasty.   SURGEON: Gaynelle Arabian, M.D.   ASSISTANT: Arlee Muslim, PA-C  ANESTHESIA: General  ESTIMATED BLOOD LOSS:100 ml    DRAINS: Hemovac x1.   COMPLICATIONS: None.   CONDITION: Stable to recovery.   BRIEF CLINICAL NOTE: [name] [name] is an 80 y.o. male with  significant dementia who had a fall 3 days ago leading to a displaced   Right femoral neck fracture. He was evaluated in the emergency  department and admitted to the medical service. He was cleared for  surgery and presents for right hip hemiarthroplasty.   PROCEDURE IN DETAIL: After successful administration of general  anesthetic, the patient was placed in the Left lateral decubitus  position with the Right side up and held with the hip positioner.Right lower extremity was isolated from her perineum with plastic drapes and  prepped and draped in the usual sterile fashion. Short posterolateral  incision was made with a 10 blade through the subcutaneous tissue to the  level of fascia lata which was incised in line with the skin incision.  The sciatic nerve was palpated and protected, and short rotators and  capsule were isolated off the femur. There was complete fracture of the  femoral neck. The femoral head was removed. Diameters 53 mm. We then  isolated the femur with retractors and freshened up the femoral neck cut  with an oscillating saw. I used a starter reamer to get into the  femoral canal and then irrigated with saline. A lateralizing reamer was  then placed. We then broached up to a size 5, which had excellent  rotational and torsional stability. Trial neck for the size 5 with a  28 + 1.5 head and 53 bipolar was placed. Hip was reduced with excellent  stability. I placed the right leg on top of the left leg, and lengths   were equal. Hip was dislocated. All trials were removed. We trialed  the cement restrictor, and size 3 was most appropriate. The size 3  cement restrictor was placed at the appropriate depth in the femoral  canal. Canal was then prepared with pulsatile lavage and thoroughly  dried. Cement was mixed and once ready implantation was injected into  the femoral canal and pressurized. The size 5 [place] basic  cemented stem was then cemented into place in about 20 degrees of  anteversion. When the cement was fully hardened, then the permanent 28 mm  head and 53 bipolar components were placed. Hip was reduced with  same stability parameters. Capsule and short rotators reattached to the  femur through drill holes with Ethibond suture. Fascia lata was closed  over Hemovac drain with running #1 V-Loc suture. Subcu closed with 2-0  Vicryl, and subcuticular running 4-0 Monocryl. The drains were hooked  to suction. Incision was cleaned and dried and Steri-Strips and a bulky  sterile dressing applied. She was then placed into a knee immobilizer,  awakened and transported to recovery in stable condition.   Gaynelle Arabian, M.D.

## 2013-04-09 NOTE — Preoperative (Signed)
Beta Blockers   Reason not to administer Beta Blockers:Not Applicable 

## 2013-04-09 NOTE — Transfer of Care (Signed)
Immediate Anesthesia Transfer of Care Note  Patient: [name] [name]  Procedure(s) Performed: Procedure(s): ARTHROPLASTY BIPOLAR HIP (Right)  Patient Location: PACU  Anesthesia Type:General  Level of Consciousness: awake, alert , oriented and patient cooperative  Airway & Oxygen Therapy: Patient Spontanous Breathing and Patient connected to face mask oxygen  Post-op Assessment: Report given to PACU RN, Post -op Vital signs reviewed and stable and Patient moving all extremities  Post vital signs: Reviewed and stable  Complications: No apparent anesthesia complications

## 2013-04-10 ENCOUNTER — Encounter (HOSPITAL_COMMUNITY): Payer: Self-pay | Admitting: Orthopedic Surgery

## 2013-04-10 DIAGNOSIS — E785 Hyperlipidemia, unspecified: Secondary | ICD-10-CM

## 2013-04-10 DIAGNOSIS — J449 Chronic obstructive pulmonary disease, unspecified: Secondary | ICD-10-CM

## 2013-04-10 DIAGNOSIS — D62 Acute posthemorrhagic anemia: Secondary | ICD-10-CM

## 2013-04-10 LAB — GLUCOSE, CAPILLARY
GLUCOSE-CAPILLARY: 107 mg/dL — AB (ref 70–99)
GLUCOSE-CAPILLARY: 112 mg/dL — AB (ref 70–99)
Glucose-Capillary: 110 mg/dL — ABNORMAL HIGH (ref 70–99)
Glucose-Capillary: 134 mg/dL — ABNORMAL HIGH (ref 70–99)
Glucose-Capillary: 110 mg/dL — ABNORMAL HIGH (ref 70–99)
Glucose-Capillary: 116 mg/dL — ABNORMAL HIGH (ref 70–99)

## 2013-04-10 LAB — BASIC METABOLIC PANEL
BUN: 38 mg/dL — AB (ref 6–23)
CHLORIDE: 105 meq/L (ref 96–112)
CO2: 22 meq/L (ref 19–32)
Calcium: 8.4 mg/dL (ref 8.4–10.5)
Creatinine, Ser: 1.92 mg/dL — ABNORMAL HIGH (ref 0.50–1.35)
GFR calc Af Amer: 37 mL/min — ABNORMAL LOW (ref >90)
GFR, EST NON AFRICAN AMERICAN: 32 mL/min — AB (ref >90)
GLUCOSE: 126 mg/dL — AB (ref 70–99)
POTASSIUM: 5.2 meq/L (ref 3.7–5.3)
SODIUM: 138 meq/L (ref 137–147)

## 2013-04-10 LAB — CBC
HEMATOCRIT: 35.6 % — AB (ref 39.0–52.0)
HEMOGLOBIN: 11.1 g/dL — AB (ref 13.0–17.0)
MCH: 29.1 pg (ref 26.0–34.0)
MCHC: 31.2 g/dL (ref 30.0–36.0)
MCV: 93.2 fL (ref 78.0–100.0)
Platelets: 143 10*3/uL — ABNORMAL LOW (ref 150–400)
RBC: 3.82 MIL/uL — AB (ref 4.22–5.81)
RDW: 14.5 % (ref 11.5–15.5)
WBC: 10.4 10*3/uL (ref 4.0–10.5)

## 2013-04-10 MED ORDER — ENOXAPARIN SODIUM 40 MG/0.4ML ~~LOC~~ SOLN: 40.0000 mg | SUBCUTANEOUS | Status: DC

## 2013-04-10 MED ORDER — HYDROCODONE-ACETAMINOPHEN 5-325 MG PO TABS
1.0000 | ORAL_TABLET | ORAL | Status: DC | PRN
  Administered 2013-04-10 – 2013-04-12 (×8): 2 via ORAL
  Filled 2013-04-10 (×7): qty 2

## 2013-04-10 MED ORDER — INSULIN ASPART 100 UNIT/ML ~~LOC~~ SOLN
0.0000 [IU] | Freq: Three times a day (TID) | SUBCUTANEOUS | Status: DC
  Administered 2013-04-10: 1 [IU] via SUBCUTANEOUS
  Administered 2013-04-12: 2 [IU] via SUBCUTANEOUS

## 2013-04-10 MED ORDER — METHOCARBAMOL 500 MG PO TABS: 500.0000 mg | ORAL_TABLET | Freq: Four times a day (QID) | ORAL | Status: DC | PRN

## 2013-04-10 MED ORDER — HYDROCODONE-ACETAMINOPHEN 5-325 MG PO TABS: 1.0000 | ORAL_TABLET | Freq: Four times a day (QID) | ORAL | Status: DC | PRN

## 2013-04-10 NOTE — Progress Notes (Addendum)
   Subjective: 1 Day Post-Op Procedure(s) (LRB): ARTHROPLASTY BIPOLAR HIP (Right) Patient reports pain as mild and moderate.   Patient seen in rounds by Dr. Wynelle Link. Patient is well, but has had some minor complaints of pain in the hip, requiring pain medications We will start therapy today.  Plan is to go Skilled nursing facility after hospital stay.  Objective: Vital signs in last 24 hours: Temp:  [97.7 F (36.5 C)-98.4 F (36.9 C)] 98 F (36.7 C) (02/05 0525) Pulse Rate:  [77-114] 93 (02/05 0525) Resp:  [13-20] 20 (02/05 0525) BP: (98-169)/(67-95) 145/84 mmHg (02/05 0525) SpO2:  [92 %-99 %] 99 % (02/05 0525)  Intake/Output from previous day:  Intake/Output Summary (Last 24 hours) at 04/10/13 0721 Last data filed at 04/10/13 0600  Gross per 24 hour  Intake   3915 ml  Output   1790 ml  Net   2125 ml    Labs:  Recent Labs  04/08/13 1910 04/09/13 1401 04/10/13 0439  HGB 14.2 13.0 11.1*    Recent Labs  04/09/13 1401 04/10/13 0439  WBC 10.3 10.4  RBC 4.42 3.82*  HCT 41.3 35.6*  PLT 144* 143*    Recent Labs  04/09/13 1401 04/10/13 0439  NA 141 138  K 4.5 5.2  CL 103 105  CO2 24 22  BUN 36* 38*  CREATININE 2.07* 1.92*  GLUCOSE 102* 126*  CALCIUM 9.0 8.4    Recent Labs  04/08/13 1910  INR 0.98    EXAM General - Patient is Alert and Appropriate Extremity - Neurovascular intact Sensation intact distally Dressing - dressing C/D/I Motor Function - intact, moving foot and toes well on exam.  Hemovac pulled without difficulty.  Past Medical History  Diagnosis Date  . HTN (hypertension)   . Dyslipidemia   . CVA (cerebral vascular accident)   . AAA (abdominal aortic aneurysm)     Surgical repair June, 2014  . Renal artery stenosis   . Tobacco abuse   . CAD (coronary artery disease)   . Hx of CABG     2005  . Ejection fraction   . Neuropathy     Right foot  . Leg pain   . Abdominal pain   . Hyperlipidemia   . Nervousness(799.21)   .  Peripheral vascular disease   . Carotid artery occlusion   . COPD (chronic obstructive pulmonary disease) 08/15/2012  . Status post abdominal aortic aneurysm repair     June, 2014    Assessment/Plan: 1 Day Post-Op Procedure(s) (LRB): ARTHROPLASTY BIPOLAR HIP (Right) Principal Problem:   Closed displaced fracture of right femoral neck Active Problems:   HTN (hypertension)   Hx of CABG   Peripheral vascular disease, unspecified   DM2 (diabetes mellitus, type 2)   Renal failure, acute on chronic   Acute urinary retention   Postoperative anemia due to acute blood loss  Estimated body mass index is 27.82 kg/(m^2) as calculated from the following:   Height as of 11/05/12: 5\' 10"  (1.778 m).   Weight as of 11/05/12: 87.952 kg (193 lb 14.4 oz). Advance diet Up with therapy Discharge to SNF  DVT Prophylaxis - Lovenox for ten days and then switch to ASA 325 mg for four more weeks Weight Bearing As Tolerated right Leg D/C Knee Immobilizer Hemovac Pulled Begin Therapy Hip Preacutions F/U in 2 weeks with Dr. Wynelle Link postop RX - Norco, Robaxin, Lovenox - placed on chart  PERKINS, [place] 04/10/2013, 7:21 AM

## 2013-04-10 NOTE — Progress Notes (Signed)
[facility] PROGRESS NOTE  [name] [name] HEN:[id number] DOB: 04-13-1931 DOA: 04/08/2013 PCP: Jani Gravel, MD  Brief narrative: 80 year old male with history of CAD, CABG 2005, urgent aortobifemoral bypass in June 2014 for critical ischemia of right lower extremity, AAA, PAD HTN, dyslipidemia, CVA, former smoker, COPD, sustained a mechanical fall at home 3 days prior to this admission. Patient sustained right hip fracture. Patient underwent right hip arthroplasty.  Assessment/Plan:   Principal Problem: Right hip/femoral neck fracture  - Status post right hip arthroplasty, 04/09/2013, no subsequent complications - Plan for discharge to skilled nursing facility - Per orthopedic recommendations, Lovenox for 10 days and then aspirin 325 mg for one month  Active problems: Type II DM with PAD, peripheral neuropathy and renal complications  - CBGs in past 24 hours: 112, 110, 134; reasonably controlled Hypertension  - BP 113/74 - hold antihypertensives for now  Acute on stage III chronic kidney disease  - Baseline Cr  in the 1.5-1.6 range. Current elevated Cr may be secondary to urinary retention, dehydration in the context of ACE inhibitors.  - may continue IV fluids - follow up BMP in am Urinary retention  - Foley in palce CAD status post CABG  - Asymptomatic.   Code Status: Full  Family Communication: None at bedside  Disposition Plan: To be determined   Consultants:  Orthopedics  Urology Procedures:  Foley catheter Right hip arthroplasty Antibiotics:  Corliss Skains, MD  [facility] Pager [phone]  If 7PM-7AM, please contact night-coverage [email or web address] Password [email or web address] 04/10/2013, 2:43 PM   LOS: 2 days    HPI/Subjective: Feels better this morning.  Objective: Filed Vitals:   04/10/13 0930 04/10/13 1200 04/10/13 1221 04/10/13 1350  BP: 133/79   113/74  Pulse: 90   89  Temp: 98.8 F (37.1 C)   97.5 F (36.4 C)  TempSrc: Oral   Oral   Resp: 16 16  14   SpO2:  94% 92% 91%    Intake/Output Summary (Last 24 hours) at 04/10/13 1443 Last data filed at 04/10/13 1200  Gross per 24 hour  Intake   4455 ml  Output   1590 ml  Net   2865 ml    Exam:   General:  Pt is alert, follows commands appropriately, not in acute distress  Cardiovascular: Regular rate and rhythm, S1/S2 appreciated  Respiratory: Clear to auscultation bilaterally, no wheezing, no crackles, no rhonchi  Abdomen: Soft, non tender, non distended, bowel sounds present, no guarding  Extremities: No edema, pulses DP and PT palpable bilaterally  Neuro: Grossly nonfocal  Data Reviewed: Basic Metabolic Panel:  Recent Labs Lab 04/08/13 1910 04/09/13 1401 04/10/13 0439  NA 138 141 138  K 4.4 4.5 5.2  CL 102 103 105  CO2 22 24 22   GLUCOSE 131* 102* 126*  BUN 35* 36* 38*  CREATININE 1.96* 2.07* 1.92*  CALCIUM 9.1 9.0 8.4   Liver Function Tests: No results found for this basename: AST, ALT, ALKPHOS, BILITOT, PROT, ALBUMIN,  in the last 168 hours No results found for this basename: LIPASE, AMYLASE,  in the last 168 hours No results found for this basename: AMMONIA,  in the last 168 hours CBC:  Recent Labs Lab 04/08/13 1910 04/09/13 1401 04/10/13 0439  WBC 13.9* 10.3 10.4  NEUTROABS 10.9*  --   --   HGB 14.2 13.0 11.1*  HCT 44.0 41.3 35.6*  MCV 91.1 93.4 93.2  PLT 146* 144* 143*   Cardiac Enzymes: No results found for this  basename: CKTOTAL, CKMB, CKMBINDEX, TROPONINI,  in the last 168 hours BNP: No components found with this basename: POCBNP,  CBG:  Recent Labs Lab 04/09/13 2031 04/09/13 2353 04/10/13 0411 04/10/13 0706 04/10/13 1221  GLUCAP 107* 127* 112* 110* 134*    SURGICAL PCR SCREEN     Status: Abnormal   Collection Time    04/09/13  4:22 PM      Result Value Range Status   MRSA, PCR NEGATIVE  NEGATIVE Final   Staphylococcus aureus POSITIVE (*) NEGATIVE Final     Studies: Dg Chest 1 View  04/08/2013   CLINICAL  DATA:  Status post fall 3 days ago.  Right hip fracture.  EXAM: CHEST - 1 VIEW  COMPARISON:  PA and lateral chest 11/11/2012.  FINDINGS: Lung volumes are lower than on the comparison exam with crowding of the bronchovascular structures and accentuation of the cardiac silhouette. Lungs are clear. No pneumothorax or pleural effusion. The patient is status post CABG.  IMPRESSION: No acute disease.   Electronically Signed   By: Inge Rise M.D.   On: 04/08/2013 19:10   Dg Hip Complete Right  04/08/2013   CLINICAL DATA:  Pain  EXAM: RIGHT HIP - COMPLETE 2+ VIEW  COMPARISON:  None.  FINDINGS: A subcapital femoral neck fracture is appreciated demonstrating impaction and superior displacement. There is mild medial angulation. Atherosclerotic calcifications identified within the femoral vessels.  IMPRESSION: Subcapital femoral neck fracture.   Electronically Signed   By: Margaree Mackintosh M.D.   On: 04/08/2013 18:43   Dg Pelvis Portable  04/09/2013   CLINICAL DATA:  Femur fracture, post op  EXAM: PORTABLE PELVIS 1-2 VIEWS  COMPARISON:  04/08/2013  FINDINGS: Right hip arthroplasty component projects in expected location. No fracture or dislocation. Iliofemoral arterial calcifications and bilateral pelvic phleboliths noted.  IMPRESSION: Right hip arthroplasty without fracture or apparent complication.   Electronically Signed   By: Arne Cleveland M.D.   On: 04/09/2013 21:13    Scheduled Meds: . aspirin EC  325 mg Oral Daily  . atorvastatin  40 mg Oral Daily  . docusate sodium  100 mg Oral BID  . enoxaparin (LOVENOX) injection  40 mg Subcutaneous Q24H  . gabapentin  300 mg Oral Daily  . insulin aspart  0-9 Units Subcutaneous TID WC  . oxybutynin  5 mg Oral Daily  . vitamin B-12  50 mcg Oral Daily   Continuous Infusions: . sodium chloride 75 mL/hr at 04/10/13 1303

## 2013-04-10 NOTE — Evaluation (Signed)
Occupational Therapy Evaluation Patient Details Name: [name] [name] MRN: [id number] DOB: January 20, 1932 Today's Date: 04/10/2013 Time: 2585-2778 OT Time Calculation (min): 18 min  OT Assessment / Plan / Recommendation History of present illness pt fell and sustained R displaced fx of femoral neck.  He was surgically managed with hemiarthroplasty   Clinical Impression   Pt was admitted for the above surgery.  He will benefit from skilled OT to increase knowledge about THPS (posterior) and increase safety and independence with adls.      OT Assessment  Patient needs continued OT Services    Follow Up Recommendations  SNF    Barriers to Discharge      Equipment Recommendations  3 in 1 bedside comode    Recommendations for Other Services    Frequency  Min 2X/week    Precautions / Restrictions Precautions Precautions: Posterior Hip;Fall Precaution Booklet Issued: Yes (comment) Precaution Comments: Sign hung on wall Restrictions Weight Bearing Restrictions: No Other Position/Activity Restrictions: WBAT   Pertinent Vitals/Pain Pain OK when sitting:  Jumps up high with any movement    ADL  Lower Body Bathing: +2 Total assistance Lower Body Bathing: Patient Percentage:  (bathing 50% with AE/ 40% for sit to stand per PT) Where Assessed - Lower Body Bathing: Supported sit to stand Lower Body Dressing: +2 Total assistance Lower Body Dressing: Patient Percentage: 20% (with AE (reacher and sock aid)) Where Assessed - Lower Body Dressing: Supported sit to stand Transfers/Ambulation Related to ADLs: Did not perform transfer:  needed A x 2 with PT earlier ADL Comments: educated on thps and adls.  Pt was able to recall 2/3 from PT session; did not recall 90 degree precaution.  Educated on AE and pt used from seated level.  Pt was is able to perform UB adls with supervision.      OT Diagnosis: Generalized weakness  OT Problem List: Decreased strength;Decreased activity tolerance;Decreased  knowledge of use of DME or AE;Pain (balance NT) OT Treatment Interventions: Self-care/ADL training;DME and/or AE instruction;Patient/family education   OT Goals(Current goals can be found in the care plan section) Acute Rehab OT Goals Patient Stated Goal: rehab before home OT Goal Formulation: With patient Time For Goal Achievement: 04/17/13 Potential to Achieve Goals: Good ADL Goals Pt Will Transfer to Toilet: with +2 assist;stand pivot transfer;bedside commode (pt 50%) Additional ADL Goal #1: pt will verbalize 3/3 thps with min cues Additional ADL Goal #2: pt will use AE with min A seated for adls  Visit Information  Last OT Received On: 04/10/13 Assistance Needed: +2 History of Present Illness: pt fell and sustained R displaced fx of femoral neck.  He was surgically managed with hemiarthroplasty       Prior [place] expects to be discharged to:: Skilled nursing facility Living Arrangements: Spouse/significant other Available Help at Discharge: Family;Available 24 hours/day Additional Comments: does not have 3:1 Prior Function Level of Independence: Independent with assistive device(s) Comments: cane Communication Communication: No difficulties Dominant Hand: Right         Vision/Perception     Cognition  Cognition Arousal/Alertness: Awake/alert Behavior During Therapy: WFL for tasks assessed/performed Overall Cognitive Status: Within Functional Limits for tasks assessed    Extremity/Trunk Assessment Upper Extremity Assessment Upper Extremity Assessment: Generalized weakness (RUE affected by old CVA:  strength 4-/5; L 4/5)   Cervical / Trunk Assessment Cervical / Trunk Assessment: Kyphotic     Mobility      Exercise    Balance  End of Session OT - End of Session Activity Tolerance: Patient tolerated treatment well Patient left: in chair;with call bell/phone within reach  [place] 04/10/2013,  3:34 PM Lesle Chris, OTR/L [phone] 04/10/2013

## 2013-04-10 NOTE — Evaluation (Signed)
Physical Therapy Evaluation Patient Details Name: [name] [name] MRN: [id number] DOB: 17-Aug-1931 Today's Date: 04/10/2013 Time: 4854-6270 PT Time Calculation (min): 40 min  PT Assessment / Plan / Recommendation History of Present Illness     Clinical Impression  Pt s/p R hip fx with hemiarthroplasty repair presents with decreased R LE strength/ROM, post op pain, Post THR, and premorbid deconditioning (including post CVA residual R LE weakness) limiting functional mobility.  Pt would greatly benefit from follow up rehab at SNF level to maximize IND and safety prior to return home with spouse.    PT Assessment  Patient needs continued PT services    Follow Up Recommendations  SNF    Does the patient have the potential to tolerate intense rehabilitation      Barriers to Discharge        Equipment Recommendations  None recommended by PT    Recommendations for Other Services OT consult   Frequency 7X/week    Precautions / Restrictions Precautions Precautions: Posterior Hip;Fall Precaution Comments: Sign hung on wall Restrictions Weight Bearing Restrictions: No Other Position/Activity Restrictions: WBAT   Pertinent Vitals/Pain 5/10; premed, ice pack provided      Mobility  Bed Mobility Overal bed mobility: +2 for physical assistance;Needs Assistance Bed Mobility: Supine to Sit Supine to sit: Max assist;+2 for physical assistance General bed mobility comments: Cues for sequence and use of L LE to self assist.  Physical assist to bring pt to side of bed utilizing draw sheet Transfers Overall transfer level: Needs assistance Equipment used: Rolling walker (2 wheeled) Transfers: Sit to/from Stand Sit to Stand: +2 physical assistance;From elevated surface;Max assist General transfer comment: Cues for LE management and use of UEs to self assist Ambulation/Gait Ambulation/Gait assistance: +2 physical assistance;Max assist Ambulation Distance (Feet): 2 Feet Assistive device:  Rolling walker (2 wheeled) Gait Pattern/deviations: Step-to pattern;Decreased step length - right;Decreased step length - left;Shuffle;Antalgic;Trunk flexed Gait velocity: decr General Gait Details: Cues for sequence, posture and position from RW - Pt with difficulty advancing L LE  2* ltd WB tolerance on UEs and R LE    Exercises Total Joint Exercises Ankle Circles/Pumps: AROM;AAROM;Supine;15 reps;Both Heel Slides: AAROM;Right;10 reps;Supine   PT Diagnosis: Difficulty walking  PT Problem List: Decreased strength;Decreased range of motion;Decreased activity tolerance;Decreased balance;Decreased mobility;Decreased knowledge of use of DME;Obesity;Pain;Decreased knowledge of precautions PT Treatment Interventions: DME instruction;Gait training;Functional mobility training;Therapeutic activities;Therapeutic exercise;Patient/family education     PT Goals(Current goals can be found in the care plan section) Acute Rehab PT Goals Patient Stated Goal: REhab and home with wife PT Goal Formulation: With patient Time For Goal Achievement: 04/24/13 Potential to Achieve Goals: Good  Visit Information  Last PT Received On: 04/10/13 Assistance Needed: +2       Prior Functioning  Home Living Family/patient expects to be discharged to:: Skilled nursing facility Living Arrangements: Spouse/significant other Available Help at Discharge: Family;Available 24 hours/day Prior Function Level of Independence: Independent with assistive device(s) Comments: cane Communication Communication: No difficulties Dominant Hand: Right    Cognition  Cognition Arousal/Alertness: Awake/alert Behavior During Therapy: WFL for tasks assessed/performed Overall Cognitive Status: Within Functional Limits for tasks assessed    Extremity/Trunk Assessment Upper Extremity Assessment Upper Extremity Assessment: Overall WFL for tasks assessed Lower Extremity Assessment Lower Extremity Assessment: RLE  deficits/detail RLE: Unable to fully assess due to pain Cervical / Trunk Assessment Cervical / Trunk Assessment: Kyphotic   Balance    End of Session PT - End of Session Equipment Utilized During Treatment: Gait belt  Activity Tolerance: Patient limited by pain Patient left: in chair;with call bell/phone within reach;with nursing/sitter in room Nurse Communication: Mobility status  GP     [name] [name] 04/10/2013, 1:08 PM

## 2013-04-10 NOTE — Progress Notes (Signed)
Physical Therapy Treatment Patient Details Name: [name] [name] MRN: [id number] DOB: 13-Dec-1931 Today's Date: 04/10/2013 Time: 0277-4128 PT Time Calculation (min): 27 min  PT Assessment / Plan / Recommendation  History of Present Illness pt fell and sustained R displaced fx of femoral neck.  He was surgically managed with hemiarthroplasty   PT Comments     Follow Up Recommendations  SNF     Does the patient have the potential to tolerate intense rehabilitation     Barriers to Discharge        Equipment Recommendations  None recommended by PT    Recommendations for Other Services OT consult  Frequency 7X/week   Progress towards PT Goals Progress towards PT goals: Progressing toward goals  Plan Current plan remains appropriate    Precautions / Restrictions Precautions Precautions: Posterior Hip;Fall Precaution Booklet Issued: Yes (comment) Precaution Comments: Sign hung on wall Restrictions Weight Bearing Restrictions: No Other Position/Activity Restrictions: WBAT   Pertinent Vitals/Pain     Mobility  Bed Mobility Overal bed mobility: +2 for physical assistance;Needs Assistance Bed Mobility: Sit to Supine Sit to supine: +2 for physical assistance;Total assist General bed mobility comments: Total assist 2* pt fatigue Transfers Overall transfer level: Needs assistance Equipment used: Rolling walker (2 wheeled) Transfers: Sit to/from Stand Sit to Stand: +2 physical assistance;From elevated surface;Max assist General transfer comment: Cues for LE management and use of UEs to self assist Ambulation/Gait Ambulation/Gait assistance: +2 physical assistance;Max assist Ambulation Distance (Feet): 4 Feet Assistive device: Rolling walker (2 wheeled) Gait Pattern/deviations: Step-to pattern;Decreased step length - right;Decreased step length - left;Shuffle;Trunk flexed Gait velocity: decr General Gait Details: Cues for sequence, posture and position from RW - Pt with  difficulty advancing L LE  2* ltd WB tolerance on UEs and R LE    Exercises     PT Diagnosis:    PT Problem List:   PT Treatment Interventions:     PT Goals (current goals can now be found in the care plan section) Acute Rehab PT Goals Patient Stated Goal: rehab before home PT Goal Formulation: With patient Time For Goal Achievement: 04/24/13 Potential to Achieve Goals: Good  Visit Information  Last PT Received On: 04/17/13 Assistance Needed: +2 History of Present Illness: pt fell and sustained R displaced fx of femoral neck.  He was surgically managed with hemiarthroplasty    Subjective Data  Subjective: This is hard Patient Stated Goal: rehab before home   Cognition  Cognition Arousal/Alertness: Awake/alert Behavior During Therapy: WFL for tasks assessed/performed Overall Cognitive Status: Within Functional Limits for tasks assessed    Balance     End of Session PT - End of Session Equipment Utilized During Treatment: Gait belt Activity Tolerance: Patient limited by pain Patient left: in bed;with call bell/phone within reach Nurse Communication: Mobility status   GP     Carel Schnee 04/10/2013, 4:42 PM

## 2013-04-11 DIAGNOSIS — I2510 Atherosclerotic heart disease of native coronary artery without angina pectoris: Secondary | ICD-10-CM

## 2013-04-11 DIAGNOSIS — I6350 Cerebral infarction due to unspecified occlusion or stenosis of unspecified cerebral artery: Secondary | ICD-10-CM

## 2013-04-11 LAB — BASIC METABOLIC PANEL
BUN: 30 mg/dL — ABNORMAL HIGH (ref 6–23)
CALCIUM: 8.2 mg/dL — AB (ref 8.4–10.5)
CO2: 22 meq/L (ref 19–32)
Chloride: 105 mEq/L (ref 96–112)
Creatinine, Ser: 1.8 mg/dL — ABNORMAL HIGH (ref 0.50–1.35)
GFR calc Af Amer: 40 mL/min — ABNORMAL LOW (ref >90)
GFR calc non Af Amer: 34 mL/min — ABNORMAL LOW (ref >90)
GLUCOSE: 113 mg/dL — AB (ref 70–99)
Potassium: 4.4 mEq/L (ref 3.7–5.3)
Sodium: 137 mEq/L (ref 137–147)

## 2013-04-11 LAB — GLUCOSE, CAPILLARY
Glucose-Capillary: 95 mg/dL (ref 70–99)
Glucose-Capillary: 114 mg/dL — ABNORMAL HIGH (ref 70–99)
Glucose-Capillary: 104 mg/dL — ABNORMAL HIGH (ref 70–99)
Glucose-Capillary: 114 mg/dL — ABNORMAL HIGH (ref 70–99)

## 2013-04-11 LAB — CBC
HEMATOCRIT: 32.6 % — AB (ref 39.0–52.0)
HEMOGLOBIN: 10.5 g/dL — AB (ref 13.0–17.0)
MCH: 29.4 pg (ref 26.0–34.0)
MCHC: 32.2 g/dL (ref 30.0–36.0)
MCV: 91.3 fL (ref 78.0–100.0)
Platelets: 145 10*3/uL — ABNORMAL LOW (ref 150–400)
RBC: 3.57 MIL/uL — ABNORMAL LOW (ref 4.22–5.81)
RDW: 14.4 % (ref 11.5–15.5)
WBC: 9.3 10*3/uL (ref 4.0–10.5)

## 2013-04-11 MED ORDER — ACETAMINOPHEN 325 MG PO TABS: 650.0000 mg | ORAL_TABLET | Freq: Four times a day (QID) | ORAL | Status: DC | PRN

## 2013-04-11 MED ORDER — BISACODYL 10 MG RE SUPP: 10.0000 mg | Freq: Every day | RECTAL | Status: DC | PRN

## 2013-04-11 MED ORDER — ZOLPIDEM TARTRATE 10 MG PO TABS: 10.0000 mg | ORAL_TABLET | Freq: Every day | ORAL | Status: DC

## 2013-04-11 MED ORDER — DSS 100 MG PO CAPS: 100.0000 mg | ORAL_CAPSULE | Freq: Two times a day (BID) | ORAL | Status: DC

## 2013-04-11 MED ORDER — POLYETHYLENE GLYCOL 3350 17 G PO PACK: 17.0000 g | PACK | Freq: Every day | ORAL | Status: DC | PRN

## 2013-04-11 NOTE — Discharge Summary (Addendum)
Physician Discharge Summary  [name] [name] KGM:[id number] DOB: 10-05-1931 DOA: 04/08/2013  PCP: Jani Gravel, MD  Admit date: 04/08/2013 Discharge date: 04/12/2013  Recommendations for Outpatient Follow-up:  1. Lovenox as prescribed for 10 days. Once finished with Lovenox please start aspirin 325 mg daily for 1 month. Watch for any signs of bleed. 2. Due to renal insufficiency we held lasix and lisinopril. Recheck Creatinine in next 4-5 days, make sure it is stable. Creatinine prior to discharge is  1.80 3. Prescribed Norvasc 5 mg daily for better BP control  Discharge Diagnoses:  Principal Problem:   Closed displaced fracture of right femoral neck Active Problems:   HTN (hypertension)   Hx of CABG   Peripheral vascular disease, unspecified   DM2 (diabetes mellitus, type 2)   Renal failure, acute on chronic   Acute urinary retention   Postoperative anemia due to acute blood loss    Discharge Condition: medically stable for discharge to SNF  Diet recommendation: as tolerated   History of present illness:  80 year old male with history of CAD, CABG 2005, urgent aortobifemoral bypass in June 2014 for critical ischemia of right lower extremity, AAA, PAD HTN, dyslipidemia, CVA, former smoker, COPD, sustained a mechanical fall at home 3 days prior to this admission. Patient sustained right hip fracture. Patient underwent right hip arthroplasty.   Assessment/Plan:   Principal Problem:  Right hip/femoral neck fracture  - Status post right hip arthroplasty, 04/09/2013, no subsequent complications  - Per orthopedic recommendations, Lovenox for 10 days and then aspirin 325 mg for one month  Active problems:  Type II DM with PAD, peripheral neuropathy and renal complications  - continue onglyza and actos Hypertension  - Norvasc 5 mg daily Acute on stage III chronic kidney disease  - Baseline Cr in the 1.5-1.6 range. Current elevated Cr may be secondary to urinary retention, dehydration in  the context of ACE inhibitors.  - creatinine improving from 2.07 --> 1.80 Urinary retention  - Foley in palce  CAD status post CABG  - Asymptomatic.   Code Status: Full  Family Communication: None at bedside   Consultants:  Orthopedics  Urology Procedures:  Foley catheter  Right hip arthroplasty Antibiotics:  Cefazolin-preop    Signed:  Leisa Lenz, MD  [facility] 04/11/2013, 2:34 PM  Pager #: [phone]    Discharge Exam: Filed Vitals:   04/11/13 1404  BP: 123/76  Pulse: 77  Temp: 97.9 F (36.6 C)  Resp: 16   Filed Vitals:   04/10/13 2003 04/10/13 2201 04/11/13 0612 04/11/13 1404  BP:  118/78 115/79 123/76  Pulse:  82 77 77  Temp:  98.2 F (36.8 C) 98.7 F (37.1 C) 97.9 F (36.6 C)  TempSrc:  Oral Oral   Resp:  18 18 16   Height:      Weight:      SpO2: 96% 95% 89% 92%    General: Pt is alert, follows commands appropriately, not in acute distress Cardiovascular: Regular rate and rhythm, S1/S2 +, no murmurs, no rubs, no gallops Respiratory: Clear to auscultation bilaterally, no wheezing, no crackles, no rhonchi Abdominal: Soft, non tender, non distended, bowel sounds +, no guarding Extremities: no edema, no cyanosis, pulses palpable bilaterally DP and PT Neuro: Grossly nonfocal  Discharge Instructions  Discharge Orders   Future Appointments Provider Department Dept Phone   05/19/2013 2:15 PM Carlena Bjornstad, MD [place] [phone]   05/20/2013 2:00 PM [facility] [place] [phone]  05/20/2013 2:40 PM Sharmon Leyden Nickel, NP [facility] [phone]   Future Orders Complete By Expires   Call MD for:  difficulty breathing, headache or visual disturbances  As directed    Call MD for:  persistant dizziness or light-headedness  As directed    Call MD for:  persistant nausea and vomiting  As directed    Call MD for:  severe uncontrolled pain  As directed     Diet - low sodium heart healthy  As directed    Full weight bearing  As directed    Questions:     Laterality:     Extremity:     Increase activity slowly  As directed        Medication List    STOP taking these medications       aspirin EC 81 MG tablet     furosemide 20 MG tablet  Commonly known as:  LASIX     lisinopril 20 MG tablet  Commonly known as:  PRINIVIL,ZESTRIL     traMADol 50 MG tablet  Commonly known as:  ULTRAM      TAKE these medications       acetaminophen 325 MG tablet  Commonly known as:  TYLENOL  Take 2 tablets (650 mg total) by mouth every 6 (six) hours as needed for mild pain (or Fever >/= 101).     amLODipine 5 MG tablet  Commonly known as:  NORVASC  Take 1 tablet (5 mg total) by mouth daily.     atorvastatin 40 MG tablet  Commonly known as:  LIPITOR  Take 40 mg by mouth daily.     bisacodyl 10 MG suppository  Commonly known as:  DULCOLAX  Place 1 suppository (10 mg total) rectally daily as needed for moderate constipation.     cholecalciferol 1000 UNITS tablet  Commonly known as:  VITAMIN D  Take 1,000 Units by mouth daily.     DSS 100 MG Caps  Take 100 mg by mouth 2 (two) times daily.     enoxaparin 40 MG/0.4ML injection  Commonly known as:  LOVENOX  Inject 0.4 mLs (40 mg total) into the skin daily. Take Lovenox injections for a total of ten days and then switch over to a 325 mg Aspirin daily for four more weeks.     gabapentin 300 MG capsule  Commonly known as:  NEURONTIN  Take 1 capsule (300 mg total) by mouth daily.     HYDROcodone-acetaminophen 5-325 MG per tablet  Commonly known as:  NORCO/VICODIN  Take 1-2 tablets by mouth every 6 (six) hours as needed for moderate pain.     methocarbamol 500 MG tablet  Commonly known as:  ROBAXIN  Take 1 tablet (500 mg total) by mouth every 6 (six) hours as needed for muscle spasms.     ONGLYZA 5 MG Tabs tablet  Generic drug:  saxagliptin HCl  Take 5 mg by mouth daily.     oxybutynin  5 MG 24 hr tablet  Commonly known as:  DITROPAN-XL  Take 5 mg by mouth daily.     pioglitazone 15 MG tablet  Commonly known as:  ACTOS  Take 7.5 mg by mouth daily.     polyethylene glycol packet  Commonly known as:  MIRALAX / GLYCOLAX  Take 17 g by mouth daily as needed for mild constipation.     vitamin B-12 100 MCG tablet  Commonly known as:  CYANOCOBALAMIN  Take 50 mcg by mouth daily.  zolpidem 10 MG tablet  Commonly known as:  AMBIEN  Take 1 tablet (10 mg total) by mouth at bedtime.            Follow-up Information   Follow up with Gearlean Alf, MD. Schedule an appointment as soon as possible for a visit in 2 weeks.   Specialty:  Orthopedic Surgery   Contact information:   [address] [place] [address] [place] [address] [phone]        The results of significant diagnostics from this hospitalization (including imaging, microbiology, ancillary and laboratory) are listed below for reference.    Significant Diagnostic Studies: Dg Chest 1 View  04/08/2013   CLINICAL DATA:  Status post fall 3 days ago.  Right hip fracture.  EXAM: CHEST - 1 VIEW  COMPARISON:  PA and lateral chest 11/11/2012.  FINDINGS: Lung volumes are lower than on the comparison exam with crowding of the bronchovascular structures and accentuation of the cardiac silhouette. Lungs are clear. No pneumothorax or pleural effusion. The patient is status post CABG.  IMPRESSION: No acute disease.   Electronically Signed   By: Inge Rise M.D.   On: 04/08/2013 19:10   Dg Hip Complete Right  04/08/2013   CLINICAL DATA:  Pain  EXAM: RIGHT HIP - COMPLETE 2+ VIEW  COMPARISON:  None.  FINDINGS: A subcapital femoral neck fracture is appreciated demonstrating impaction and superior displacement. There is mild medial angulation. Atherosclerotic calcifications identified within the femoral vessels.  IMPRESSION: Subcapital femoral neck fracture.   Electronically Signed   By: Margaree Mackintosh M.D.   On:  04/08/2013 18:43   Dg Pelvis Portable  04/09/2013   CLINICAL DATA:  Femur fracture, post op  EXAM: PORTABLE PELVIS 1-2 VIEWS  COMPARISON:  04/08/2013  FINDINGS: Right hip arthroplasty component projects in expected location. No fracture or dislocation. Iliofemoral arterial calcifications and bilateral pelvic phleboliths noted.  IMPRESSION: Right hip arthroplasty without fracture or apparent complication.   Electronically Signed   By: Arne Cleveland M.D.   On: 04/09/2013 21:13    Microbiology: Recent Results (from the past 240 hour(s))  SURGICAL PCR SCREEN     Status: Abnormal   Collection Time    04/09/13  4:22 PM      Result Value Range Status   MRSA, PCR NEGATIVE  NEGATIVE Final   Staphylococcus aureus POSITIVE (*) NEGATIVE Final   Comment:            The Xpert SA Assay (FDA     approved for NASAL specimens     in patients over 24 years of age),     is one component of     a comprehensive surveillance     program.  Test performance has     been validated by [employer] for patients greater     than or equal to 68 year old.     It is not intended     to diagnose infection nor to     guide or monitor treatment.     Labs: Basic Metabolic Panel:  Recent Labs Lab 04/08/13 1910 04/09/13 1401 04/10/13 0439 04/11/13 0408  NA 138 141 138 137  K 4.4 4.5 5.2 4.4  CL 102 103 105 105  CO2 22 24 22 22   GLUCOSE 131* 102* 126* 113*  BUN 35* 36* 38* 30*  CREATININE 1.96* 2.07* 1.92* 1.80*  CALCIUM 9.1 9.0 8.4 8.2*   Liver Function Tests: No results found for this basename:  AST, ALT, ALKPHOS, BILITOT, PROT, ALBUMIN,  in the last 168 hours No results found for this basename: LIPASE, AMYLASE,  in the last 168 hours No results found for this basename: AMMONIA,  in the last 168 hours CBC:  Recent Labs Lab 04/08/13 1910 04/09/13 1401 04/10/13 0439 04/11/13 0408  WBC 13.9* 10.3 10.4 9.3  NEUTROABS 10.9*  --   --   --   HGB 14.2 13.0 11.1* 10.5*  HCT 44.0 41.3 35.6* 32.6*   MCV 91.1 93.4 93.2 91.3  PLT 146* 144* 143* 145*   Cardiac Enzymes: No results found for this basename: CKTOTAL, CKMB, CKMBINDEX, TROPONINI,  in the last 168 hours BNP: BNP (last 3 results) No results found for this basename: PROBNP,  in the last 8760 hours CBG:  Recent Labs Lab 04/10/13 1221 04/10/13 1723 04/10/13 2124 04/11/13 0719 04/11/13 1150  GLUCAP 134* 110* 116* 95 114*    Time coordinating discharge: Over 30 minutes

## 2013-04-11 NOTE — Progress Notes (Signed)
Physical Therapy Treatment Patient Details Name: [name] [name] MRN: [id number] DOB: 1931-03-15 Today's Date: 04/11/2013 Time: 7425-9563 PT Time Calculation (min): 19 min  PT Assessment / Plan / Recommendation  History of Present Illness pt fell and sustained R displaced fx of femoral neck.  He was surgically managed with hemiarthroplasty   PT Comments     Follow Up Recommendations  SNF     Does the patient have the potential to tolerate intense rehabilitation     Barriers to Discharge        Equipment Recommendations  None recommended by PT    Recommendations for Other Services OT consult  Frequency 7X/week   Progress towards PT Goals Progress towards PT goals: Progressing toward goals  Plan Current plan remains appropriate    Precautions / Restrictions Precautions Precautions: Posterior Hip;Fall Precaution Booklet Issued: Yes (comment) Precaution Comments: Pt recalls 2/3 THR without cues Restrictions Weight Bearing Restrictions: No Other Position/Activity Restrictions: WBAT   Pertinent Vitals/Pain     Mobility  Bed Mobility Overal bed mobility: +2 for physical assistance;Needs Assistance Bed Mobility: Sit to Supine Supine to sit: Max assist;+2 for physical assistance Sit to supine: Max assist;+2 for physical assistance General bed mobility comments: Increased time, cues for use of L LE to self assist Transfers Overall transfer level: Needs assistance Equipment used: Rolling walker (2 wheeled) Transfers: Sit to/from Stand Sit to Stand: +2 physical assistance;Max assist General transfer comment: Cues for LE management and use of UEs to self assist Ambulation/Gait Ambulation/Gait assistance: +2 physical assistance;Mod assist Ambulation Distance (Feet): 9 Feet Assistive device: Rolling walker (2 wheeled) Gait Pattern/deviations: Step-to pattern;Decreased step length - right;Decreased step length - left;Shuffle;Antalgic;Trunk flexed Gait velocity: decr General  Gait Details: Cues for sequence, posture and position from RW     Exercises Total Joint Exercises Ankle Circles/Pumps: AROM;Both;15 reps;Supine Quad Sets: AROM;Both;10 reps;Supine Heel Slides: AAROM;Right;10 reps;Supine Hip ABduction/ADduction: AAROM;Both;10 reps;Supine   PT Diagnosis:    PT Problem List:   PT Treatment Interventions:     PT Goals (current goals can now be found in the care plan section) Acute Rehab PT Goals Patient Stated Goal: rehab before home PT Goal Formulation: With patient Time For Goal Achievement: 04/24/13 Potential to Achieve Goals: Good  Visit Information  Last PT Received On: 04/11/13 Assistance Needed: +2 History of Present Illness: pt fell and sustained R displaced fx of femoral neck.  He was surgically managed with hemiarthroplasty    Subjective Data  Subjective: Doing better than yesterday Patient Stated Goal: rehab before home   Cognition  Cognition Arousal/Alertness: Awake/alert Behavior During Therapy: WFL for tasks assessed/performed Overall Cognitive Status: Within Functional Limits for tasks assessed    Balance     End of Session PT - End of Session Equipment Utilized During Treatment: Gait belt Activity Tolerance: Patient limited by pain;Patient limited by fatigue Patient left: in bed;with call bell/phone within reach Nurse Communication: Mobility status   GP     Saundra Gin 04/11/2013, 3:02 PM

## 2013-04-11 NOTE — Discharge Instructions (Signed)
Dr. Gaynelle Arabian Total Joint Specialist [facility] [address] [place], [place] [address] [phone]   TOTAL HIP REPLACEMENT / BIPOLAR HIP SURGERY POSTOPERATIVE DIRECTIONS    Hip Rehabilitation, Guidelines Following Surgery  The results of a hip operation are greatly improved after range of motion and muscle strengthening exercises. Follow all safety measures which are given to protect your hip. If any of these exercises cause increased pain or swelling in your joint, decrease the amount until you are comfortable again. Then slowly increase the exercises. Call your caregiver if you have problems or questions.  HOME CARE INSTRUCTIONS  Most of the following instructions are designed to prevent the dislocation of your new hip.  Remove items at home which could result in a fall. This includes throw rugs or furniture in walking pathways.  Continue medications as instructed at time of discharge.  You may have some home medications which will be placed on hold until you complete the course of blood thinner medication.  You may start showering once you are discharged home but do not submerge the incision under water. Just pat the incision dry and apply a dry gauze dressing on daily. Do not put on socks or shoes without following the instructions of your caregivers.  Sit on high chairs so your hips are not bent more than 90 degrees.  Sit on chairs with arms. Use the chair arms to help push yourself up when arising.  Keep your leg on the side of the operation out in front of you when standing up.  Arrange for the use of a toilet seat elevator so you are not sitting low.  Do not do any exercises or get in any positions that cause your toes to point in (pigeon toed).  Always sleep with a pillow between your legs. Do not lie on your side in sleep with both knees touching the bed.   Walk with walker as instructed.  You may resume a sexual relationship in one month or  when given the OK by your caregiver.  Use walker as long as suggested by your caregivers.  You may put full weight on your legs and walk as much as is comfortable. Avoid periods of inactivity such as sitting longer than an hour when not asleep. This helps prevent blood clots.  You may return to work once you are cleared by Engineer, production.  Do not drive a car for 6 weeks or until released by your surgeon.  Do not drive while taking narcotics.  Wear elastic stockings for three weeks following surgery during the day but you may remove then at night.  Make sure you keep all of your appointments after your operation with all of your doctors and caregivers. You should call the office at the above phone number and make an appointment for approximately two weeks after the date of your surgery. Change the dressing daily and reapply a dry dressing each time. Please pick up a stool softener and laxative for home use as long as you are requiring pain medications.  Continue to use ice on the hip for pain and swelling from surgery. You may notice swelling that will progress down to the foot and ankle.  This is normal after  surgery.  Elevate the leg when you are not up walking on it.   It is important for you to complete the blood thinner medication as prescribed by your doctor.  Continue to use the breathing machine which will help keep your  temperature down.  It is common for your temperature to cycle up and down following surgery, especially at night when you are not up moving around and exerting yourself.  The breathing machine keeps your lungs expanded and your temperature down.  RANGE OF MOTION AND STRENGTHENING EXERCISES  These exercises are designed to help you keep full movement of your hip joint. Follow your caregiver's or physical therapist's instructions. Perform all exercises about fifteen times, three times per day or as directed. Exercise both hips, even if you have had only one joint replacement.  These exercises can be done on a training (exercise) mat, on the floor, on a table or on a bed. Use whatever works the best and is most comfortable for you. Use music or television while you are exercising so that the exercises are a pleasant break in your day. This will make your life better with the exercises acting as a break in routine you can look forward to.  Lying on your back, slowly slide your foot toward your buttocks, raising your knee up off the floor. Then slowly slide your foot back down until your leg is straight again.  Lying on your back spread your legs as far apart as you can without causing discomfort.  Lying on your side, raise your upper leg and foot straight up from the floor as far as is comfortable. Slowly lower the leg and repeat.  Lying on your back, tighten up the muscle in the front of your thigh (quadriceps muscles). You can do this by keeping your leg straight and trying to raise your heel off the floor. This helps strengthen the largest muscle supporting your knee.  Lying on your back, tighten up the muscles of your buttocks both with the legs straight and with the knee bent at a comfortable angle while keeping your heel on the floor.   SKILLED REHAB INSTRUCTIONS: If the patient is transferred to a skilled rehab facility following release from the hospital, a list of the current medications will be sent to the facility for the patient to continue.  When discharged from the skilled rehab facility, please have the facility set up the patient's [place] prior to being released. Also, the skilled facility will be responsible for providing the patient with their medications at time of release from the facility to include their pain medication, the muscle relaxants, and their blood thinner medication. If the patient is still at the rehab facility at time of the two week follow up appointment, the skilled rehab facility will also need to assist the patient in  arranging follow up appointment in our office and any transportation needs.  MAKE SURE YOU:  Understand these instructions.  Will watch your condition.  Will get help right away if you are not doing well or get worse.  Pick up stool softner and laxative for home. Do not submerge incision under water. May shower. Continue to use ice for pain and swelling from surgery. Hip precautions.  Total Hip Protocol.  Lovenox injections for a total of ten days and then ASA 325 mg daily for four more weeks.

## 2013-04-11 NOTE — Care Management Note (Signed)
    Page 1 of 1   04/11/2013     5:20:02 PM   CARE MANAGEMENT NOTE 04/11/2013  Patient:  THATCHER, DOBERSTEIN   Account Number:  [id number]  Date Initiated:  04/11/2013  Documentation initiated by:  Sherrin Daisy  Subjective/Objective Assessment:   dx rt hip fx     Action/Plan:   SNF rehab   Anticipated DC Date:  04/11/2013   Anticipated DC Plan:  SKILLED NURSING FACILITY  In-house referral  Clinical Social Worker      DC Planning Services  CM consult      Choice offered to / List presented to:             Status of service:  Completed, signed off Medicare Important Message given?   (If response is "NO", the following Medicare IM given date fields will be blank) Date Medicare IM given:   Date Additional Medicare IM given:    Discharge Disposition:    Per UR Regulation:    If discussed at Long Length of Stay Meetings, dates discussed:    Comments:

## 2013-04-11 NOTE — Progress Notes (Signed)
Subjective: 2 Days Post-Op Procedure(s) (LRB): ARTHROPLASTY BIPOLAR HIP (Right) Patient reports pain as mild.   Patient seen in rounds by Dr. Wynelle Link. Patient is well, but has had some minor complaints of pain in the hip, requiring pain medications Plan is to go Skilled nursing facility after hospital stay.  Objective: Vital signs in last 24 hours: Temp:  [97.5 F (36.4 C)-98.7 F (37.1 C)] 97.6 F (36.4 C) (02/06 1234) Pulse Rate:  [63-89] 63 (02/06 1234) Resp:  [14-18] 16 (02/06 1234) BP: (113-151)/(74-79) 151/77 mmHg (02/06 1234) SpO2:  [88 %-98 %] 98 % (02/06 1234) Weight:  [87 kg (191 lb 12.8 oz)] 87 kg (191 lb 12.8 oz) (02/05 1716)  Intake/Output from previous day:  Intake/Output Summary (Last 24 hours) at 04/11/13 1259 Last data filed at 04/11/13 1884  Gross per 24 hour  Intake    240 ml  Output    950 ml  Net   -710 ml    Intake/Output this shift:    Labs:  Recent Labs  04/08/13 1910 04/09/13 1401 04/10/13 0439 04/11/13 0408  HGB 14.2 13.0 11.1* 10.5*    Recent Labs  04/10/13 0439 04/11/13 0408  WBC 10.4 9.3  RBC 3.82* 3.57*  HCT 35.6* 32.6*  PLT 143* 145*    Recent Labs  04/10/13 0439 04/11/13 0408  NA 138 137  K 5.2 4.4  CL 105 105  CO2 22 22  BUN 38* 30*  CREATININE 1.92* 1.80*  GLUCOSE 126* 113*  CALCIUM 8.4 8.2*    Recent Labs  04/08/13 1910  INR 0.98    EXAM General - Patient is Alert, Appropriate and Oriented Extremity - Neurovascular intact Sensation intact distally Dorsiflexion/Plantar flexion intact Dressing/Incision - clean, dry, no drainage Motor Function - intact, moving foot and toes well on exam.   Past Medical History  Diagnosis Date  . HTN (hypertension)   . Dyslipidemia   . CVA (cerebral vascular accident)   . AAA (abdominal aortic aneurysm)     Surgical repair June, 2014  . Renal artery stenosis   . Tobacco abuse   . CAD (coronary artery disease)   . Hx of CABG     2005  . Ejection fraction     . Neuropathy     Right foot  . Leg pain   . Abdominal pain   . Hyperlipidemia   . Nervousness(799.21)   . Peripheral vascular disease   . Carotid artery occlusion   . COPD (chronic obstructive pulmonary disease) 08/15/2012  . Status post abdominal aortic aneurysm repair     June, 2014    Assessment/Plan: 2 Days Post-Op Procedure(s) (LRB): ARTHROPLASTY BIPOLAR HIP (Right) Principal Problem:   Closed displaced fracture of right femoral neck Active Problems:   HTN (hypertension)   Hx of CABG   Peripheral vascular disease, unspecified   DM2 (diabetes mellitus, type 2)   Renal failure, acute on chronic   Acute urinary retention   Postoperative anemia due to acute blood loss  Estimated body mass index is 27.52 kg/(m^2) as calculated from the following:   Height as of this encounter: 5\' 10"  (1.778 m).   Weight as of this encounter: 87 kg (191 lb 12.8 oz). Up with therapy Plan for discharge tomorrow Discharge to SNF as per medicine  DVT Prophylaxis - Lovenox for ten days and then switch to ASA 325 mg for four more weeks Weight Bearing As Tolerated right Leg Hip Preacutions  F/U in 2 weeks with Dr. Wynelle Link postop  RX - Norco, Robaxin, Lovenox - placed on chart  Nyleah Mcginnis 04/11/2013, 12:59 PM

## 2013-04-11 NOTE — Progress Notes (Signed)
Physical Therapy Treatment Patient Details Name: [name] [name] MRN: [id number] DOB: 08-19-1931 Today's Date: 04/11/2013 Time: 1110-1140 PT Time Calculation (min): 30 min  PT Assessment / Plan / Recommendation  History of Present Illness pt fell and sustained R displaced fx of femoral neck.  He was surgically managed with hemiarthroplasty   PT Comments   Marked improvement in activity tolerance from last day.    Follow Up Recommendations  SNF     Does the patient have the potential to tolerate intense rehabilitation     Barriers to Discharge        Equipment Recommendations  None recommended by PT    Recommendations for Other Services OT consult  Frequency 7X/week   Progress towards PT Goals Progress towards PT goals: Progressing toward goals  Plan Current plan remains appropriate    Precautions / Restrictions Precautions Precautions: Posterior Hip;Fall Precaution Booklet Issued: Yes (comment) Precaution Comments: Pt recalls 2/3 THR without cues Restrictions Weight Bearing Restrictions: No Other Position/Activity Restrictions: WBAT   Pertinent Vitals/Pain 5/10; premed, ice packs provided.    Mobility  Bed Mobility Overal bed mobility: +2 for physical assistance;Needs Assistance Bed Mobility: Supine to Sit Supine to sit: Max assist;+2 for physical assistance General bed mobility comments: Increased time, cues for use of L LE to self assist, utilized drop pad to assist pt to EOB and to bring trunk to upright Transfers Overall transfer level: Needs assistance Equipment used: Rolling walker (2 wheeled) Transfers: Sit to/from Stand Sit to Stand: +2 physical assistance;From elevated surface;Max assist General transfer comment: Cues for LE management and use of UEs to self assist Ambulation/Gait Ambulation/Gait assistance: Mod assist;+2 physical assistance Ambulation Distance (Feet): 17 Feet Assistive device: Rolling walker (2 wheeled) Gait Pattern/deviations: Step-to  pattern;Decreased step length - right;Decreased step length - left;Shuffle;Antalgic;Trunk flexed Gait velocity: decr General Gait Details: Cues for sequence, posture and position from RW     Exercises Total Joint Exercises Ankle Circles/Pumps: AROM;Both;15 reps;Supine Quad Sets: AROM;Both;10 reps;Supine Heel Slides: AAROM;Right;10 reps;Supine Hip ABduction/ADduction: AAROM;Both;10 reps;Supine   PT Diagnosis:    PT Problem List:   PT Treatment Interventions:     PT Goals (current goals can now be found in the care plan section) Acute Rehab PT Goals Patient Stated Goal: rehab before home PT Goal Formulation: With patient Time For Goal Achievement: 04/24/13 Potential to Achieve Goals: Good  Visit Information  Last PT Received On: 04/11/13 Assistance Needed: +2 History of Present Illness: pt fell and sustained R displaced fx of femoral neck.  He was surgically managed with hemiarthroplasty    Subjective Data  Subjective: Doing better than yesterday Patient Stated Goal: rehab before home   Cognition  Cognition Arousal/Alertness: Awake/alert Behavior During Therapy: WFL for tasks assessed/performed Overall Cognitive Status: Within Functional Limits for tasks assessed    Balance     End of Session PT - End of Session Equipment Utilized During Treatment: Gait belt Activity Tolerance: Patient limited by pain Patient left: in chair;with call bell/phone within reach Nurse Communication: Mobility status   GP     Jehiel Koepp 04/11/2013, 12:53 PM

## 2013-04-11 NOTE — Progress Notes (Signed)
CSW assisting with d/c planning. Pt will d/c to [facility] on SAT. If stable for discharge. Tentative d/c summary sent to [facility]. MD will decide in am if pt is able to d/c. Week end CSW will assist with d/c planning to SNF.  Werner Lean LCSW [phone]

## 2013-04-12 DIAGNOSIS — R269 Unspecified abnormalities of gait and mobility: Secondary | ICD-10-CM | POA: Diagnosis not present

## 2013-04-12 DIAGNOSIS — S72009D Fracture of unspecified part of neck of unspecified femur, subsequent encounter for closed fracture with routine healing: Secondary | ICD-10-CM | POA: Diagnosis not present

## 2013-04-12 DIAGNOSIS — I10 Essential (primary) hypertension: Secondary | ICD-10-CM | POA: Diagnosis not present

## 2013-04-12 DIAGNOSIS — R1312 Dysphagia, oropharyngeal phase: Secondary | ICD-10-CM | POA: Diagnosis not present

## 2013-04-12 DIAGNOSIS — S72009A Fracture of unspecified part of neck of unspecified femur, initial encounter for closed fracture: Secondary | ICD-10-CM | POA: Diagnosis not present

## 2013-04-12 DIAGNOSIS — E119 Type 2 diabetes mellitus without complications: Secondary | ICD-10-CM | POA: Diagnosis not present

## 2013-04-12 DIAGNOSIS — Z9181 History of falling: Secondary | ICD-10-CM | POA: Diagnosis not present

## 2013-04-12 DIAGNOSIS — I739 Peripheral vascular disease, unspecified: Secondary | ICD-10-CM | POA: Diagnosis not present

## 2013-04-12 DIAGNOSIS — Z96649 Presence of unspecified artificial hip joint: Secondary | ICD-10-CM | POA: Diagnosis not present

## 2013-04-12 DIAGNOSIS — E1149 Type 2 diabetes mellitus with other diabetic neurological complication: Secondary | ICD-10-CM | POA: Diagnosis not present

## 2013-04-12 DIAGNOSIS — M6281 Muscle weakness (generalized): Secondary | ICD-10-CM | POA: Diagnosis not present

## 2013-04-12 DIAGNOSIS — E785 Hyperlipidemia, unspecified: Secondary | ICD-10-CM | POA: Diagnosis not present

## 2013-04-12 DIAGNOSIS — G909 Disorder of the autonomic nervous system, unspecified: Secondary | ICD-10-CM | POA: Diagnosis not present

## 2013-04-12 DIAGNOSIS — I69998 Other sequelae following unspecified cerebrovascular disease: Secondary | ICD-10-CM | POA: Diagnosis not present

## 2013-04-12 DIAGNOSIS — I2510 Atherosclerotic heart disease of native coronary artery without angina pectoris: Secondary | ICD-10-CM | POA: Diagnosis not present

## 2013-04-12 DIAGNOSIS — N183 Chronic kidney disease, stage 3 unspecified: Secondary | ICD-10-CM | POA: Diagnosis not present

## 2013-04-12 DIAGNOSIS — D62 Acute posthemorrhagic anemia: Secondary | ICD-10-CM | POA: Diagnosis not present

## 2013-04-12 DIAGNOSIS — M25559 Pain in unspecified hip: Secondary | ICD-10-CM | POA: Diagnosis not present

## 2013-04-12 DIAGNOSIS — R279 Unspecified lack of coordination: Secondary | ICD-10-CM | POA: Diagnosis not present

## 2013-04-12 DIAGNOSIS — J449 Chronic obstructive pulmonary disease, unspecified: Secondary | ICD-10-CM | POA: Diagnosis not present

## 2013-04-12 DIAGNOSIS — Z471 Aftercare following joint replacement surgery: Secondary | ICD-10-CM | POA: Diagnosis not present

## 2013-04-12 DIAGNOSIS — S79919A Unspecified injury of unspecified hip, initial encounter: Secondary | ICD-10-CM | POA: Diagnosis not present

## 2013-04-12 LAB — CBC
HEMATOCRIT: 31.7 % — AB (ref 39.0–52.0)
Hemoglobin: 10.1 g/dL — ABNORMAL LOW (ref 13.0–17.0)
MCH: 29.4 pg (ref 26.0–34.0)
MCHC: 31.9 g/dL (ref 30.0–36.0)
MCV: 92.2 fL (ref 78.0–100.0)
Platelets: 154 10*3/uL (ref 150–400)
RBC: 3.44 MIL/uL — AB (ref 4.22–5.81)
RDW: 14.4 % (ref 11.5–15.5)
WBC: 8.9 10*3/uL (ref 4.0–10.5)

## 2013-04-12 LAB — GLUCOSE, CAPILLARY
GLUCOSE-CAPILLARY: 109 mg/dL — AB (ref 70–99)
GLUCOSE-CAPILLARY: 167 mg/dL — AB (ref 70–99)
Glucose-Capillary: 103 mg/dL — ABNORMAL HIGH (ref 70–99)

## 2013-04-12 MED ORDER — BISACODYL 10 MG RE SUPP: 10.0000 mg | Freq: Once | RECTAL | Status: DC

## 2013-04-12 MED ORDER — ALPRAZOLAM 1 MG PO TABS
1.0000 mg | ORAL_TABLET | Freq: Once | ORAL | Status: AC
  Administered 2013-04-12: 1 mg via ORAL
  Filled 2013-04-12: qty 1

## 2013-04-12 MED ORDER — AMLODIPINE BESYLATE 5 MG PO TABS: 5.0000 mg | ORAL_TABLET | Freq: Every day | ORAL | Status: DC

## 2013-04-12 MED ORDER — AMLODIPINE BESYLATE 5 MG PO TABS
5.0000 mg | ORAL_TABLET | Freq: Every day | ORAL | Status: DC
  Administered 2013-04-12: 5 mg via ORAL
  Filled 2013-04-12: qty 1

## 2013-04-12 NOTE — Progress Notes (Signed)
Pt transported to [facility] via [place]. Assessment unchanged from am

## 2013-04-12 NOTE — Progress Notes (Signed)
Per MD, Pt ready for d/c.  Notified RN, Pt, family and facility.  Confirmed receipt of d/c summary, as admission arranged yesterday by Weekday CSW.  Facility ready to receive Pt.  Arranged for transportation.  Bernita Raisin, [place] Work [phone]

## 2013-04-12 NOTE — Progress Notes (Signed)
[facility] PROGRESS NOTE  [name] [name] UXN:[id number] DOB: 05-08-1931 DOA: 04/08/2013 PCP: Jani Gravel, MD  Brief narrative: 80 year old male with history of CAD, CABG 2005, urgent aortobifemoral bypass in June 2014 for critical ischemia of right lower extremity, AAA, PAD HTN, dyslipidemia, CVA, former smoker, COPD, sustained a mechanical fall at home 3 days prior to this admission. Patient sustained right hip fracture. Patient underwent right hip arthroplasty.   Please refer to discharge summary done 04/11/2013. No changes in medical management since 04/11/2013.  Assessment/Plan:   Principal Problem:  Right hip/femoral neck fracture  - Status post right hip arthroplasty, 04/09/2013, no subsequent complications  - Per orthopedic recommendations, Lovenox for 10 days and then aspirin 325 mg for one month  Active problems:  Type II DM with PAD, peripheral neuropathy and renal complications  - continue onglyza and actos  Hypertension  - BP good without BP meds  - hold antihypertensives for now  Acute on stage III chronic kidney disease  - Baseline Cr in the 1.5-1.6 range. Current elevated Cr may be secondary to urinary retention, dehydration in the context of ACE inhibitors.  - creatinine improving from 2.07 --> 1.80  Urinary retention  - Foley in palce  CAD status post CABG  - Asymptomatic.   Code Status: Full   Consultants:  Orthopedics  Urology Procedures:  Foley catheter  Right hip arthroplasty Antibiotics:  Corliss Skains, MD  [facility] Pager [phone]  If 7PM-7AM, please contact night-coverage [email or web address] Password [facility] 04/12/2013, 7:57 AM   LOS: 4 days    HPI/Subjective: No overnight events.  Objective: Filed Vitals:   04/11/13 0612 04/11/13 1404 04/11/13 2145 04/12/13 0528  BP: 115/79 123/76 138/84 158/88  Pulse: 77 77  85  Temp: 98.7 F (37.1 C) 97.9 F (36.6 C) 98.2 F (36.8 C) 97.8 F (36.6 C)  TempSrc: Oral  Oral  Oral  Resp: 18 16 16 16   Height:      Weight:      SpO2: 89% 92%  93%    Intake/Output Summary (Last 24 hours) at 04/12/13 0757 Last data filed at 04/12/13 2376  Gross per 24 hour  Intake    720 ml  Output   1230 ml  Net   -510 ml    Exam:   General:  Pt is alert, follows commands appropriately  Cardiovascular: Regular rate and rhythm, S1/S2 appreciated   Respiratory: Clear to auscultation bilaterally, no wheezing, no crackles, no rhonchi  Abdomen: Soft, non tender, non distended, bowel sounds present, no guarding  Extremities: pulses DP and PT palpable bilaterally  Neuro: Grossly nonfocal  Data Reviewed: Basic Metabolic Panel:  Recent Labs Lab 04/08/13 1910 04/09/13 1401 04/10/13 0439 04/11/13 0408  NA 138 141 138 137  K 4.4 4.5 5.2 4.4  CL 102 103 105 105  CO2 22 24 22 22   GLUCOSE 131* 102* 126* 113*  BUN 35* 36* 38* 30*  CREATININE 1.96* 2.07* 1.92* 1.80*  CALCIUM 9.1 9.0 8.4 8.2*   Liver Function Tests: No results found for this basename: AST, ALT, ALKPHOS, BILITOT, PROT, ALBUMIN,  in the last 168 hours No results found for this basename: LIPASE, AMYLASE,  in the last 168 hours No results found for this basename: AMMONIA,  in the last 168 hours CBC:  Recent Labs Lab 04/08/13 1910 04/09/13 1401 04/10/13 0439 04/11/13 0408 04/12/13 0439  WBC 13.9* 10.3 10.4 9.3 8.9  NEUTROABS 10.9*  --   --   --   --  HGB 14.2 13.0 11.1* 10.5* 10.1*  HCT 44.0 41.3 35.6* 32.6* 31.7*  MCV 91.1 93.4 93.2 91.3 92.2  PLT 146* 144* 143* 145* 154   Cardiac Enzymes: No results found for this basename: CKTOTAL, CKMB, CKMBINDEX, TROPONINI,  in the last 168 hours BNP: No components found with this basename: POCBNP,  CBG:  Recent Labs Lab 04/11/13 0719 04/11/13 1150 04/11/13 1720 04/11/13 2142 04/12/13 0727  GLUCAP 95 114* 104* 114* 103*    Recent Results (from the past 240 hour(s))  SURGICAL PCR SCREEN     Status: Abnormal   Collection Time    04/09/13   4:22 PM      Result Value Range Status   MRSA, PCR NEGATIVE  NEGATIVE Final   Staphylococcus aureus POSITIVE (*) NEGATIVE Final   Comment:            The Xpert SA Assay (FDA     approved for NASAL specimens     in patients over 59 years of age),     is one component of     a comprehensive surveillance     program.  Test performance has     been validated by [employer] for patients greater     than or equal to 31 year old.     It is not intended     to diagnose infection nor to     guide or monitor treatment.     Studies: No results found.  Scheduled Meds: . aspirin EC  325 mg Oral Daily  . atorvastatin  40 mg Oral Daily  . docusate sodium  100 mg Oral BID  . enoxaparin (LOVENOX) injection  40 mg Subcutaneous Q24H  . gabapentin  300 mg Oral Daily  . insulin aspart  0-9 Units Subcutaneous TID WC  . oxybutynin  5 mg Oral Daily  . vitamin B-12  50 mcg Oral Daily   Continuous Infusions: . sodium chloride 75 mL/hr at 04/10/13 1303

## 2013-04-12 NOTE — Progress Notes (Signed)
Physical Therapy Treatment Patient Details Name: [name] [name] MRN: [id number] DOB: 08/24/1931 Today's Date: 04/12/2013 Time: 6314-9702 PT Time Calculation (min): 25 min  PT Assessment / Plan / Recommendation  History of Present Illness pt fell and sustained R displaced fx of femoral neck.  He was surgically managed with hemiarthroplasty   PT Comments   Pt declined OOB activity but agreeable to bed therex.  Increased time with multiple short breaks to complete task.  Pt and spouse with multiple questions re: procedure.  Pt hoping for d/c later this date to SNF.  Follow Up Recommendations  SNF     Does the patient have the potential to tolerate intense rehabilitation     Barriers to Discharge        Equipment Recommendations  None recommended by PT    Recommendations for Other Services OT consult  Frequency Min 6X/week   Progress towards PT Goals Progress towards PT goals: Progressing toward goals  Plan Current plan remains appropriate    Precautions / Restrictions Precautions Precautions: Posterior Hip;Fall Precaution Booklet Issued: Yes (comment) Precaution Comments: Pt recalls 2/3 THR without cues; All precautions reviewed with pt and spouse Restrictions Weight Bearing Restrictions: No Other Position/Activity Restrictions: WBAT   Pertinent Vitals/Pain 5/10; premed, ice pack provided.    Mobility       Exercises Total Joint Exercises Ankle Circles/Pumps: AROM;Both;Supine;20 reps Quad Sets: AROM;Both;10 reps;Supine Gluteal Sets: AROM;Both;10 reps;Supine Heel Slides: AAROM;Right;Supine;20 reps Hip ABduction/ADduction: AAROM;Both;Supine;20 reps   PT Diagnosis:    PT Problem List:   PT Treatment Interventions:     PT Goals (current goals can now be found in the care plan section) Acute Rehab PT Goals Patient Stated Goal: rehab before home PT Goal Formulation: With patient Time For Goal Achievement: 04/24/13 Potential to Achieve Goals: Good  Visit Information  Last PT Received On: 04/12/13 Assistance Needed: +1 History of Present Illness: pt fell and sustained R displaced fx of femoral neck.  He was surgically managed with hemiarthroplasty    Subjective Data  Subjective: I need to urinate or I cant leave for rehab Patient Stated Goal: rehab before home   Cognition  Cognition Arousal/Alertness: Awake/alert Behavior During Therapy: WFL for tasks assessed/performed Overall Cognitive Status: Within Functional Limits for tasks assessed    Balance     End of Session PT - End of Session Activity Tolerance: Patient limited by fatigue Patient left: in bed;with call bell/phone within reach;with family/visitor present Nurse Communication: Mobility status   GP     Woodard Perrell 04/12/2013, 3:42 PM

## 2013-04-12 NOTE — Progress Notes (Signed)
   Subjective: 3 Days Post-Op Procedure(s) (LRB): ARTHROPLASTY BIPOLAR HIP (Right) Patient reports pain as mild.  Pain much better today Plan is to go Rehab after hospital stay.  Objective: Vital signs in last 24 hours: Temp:  [97.8 F (36.6 C)-98.2 F (36.8 C)] 97.8 F (36.6 C) (02/07 0528) Pulse Rate:  [77-85] 85 (02/07 0528) Resp:  [16] 16 (02/07 0528) BP: (123-158)/(76-88) 158/88 mmHg (02/07 0528) SpO2:  [92 %-93 %] 93 % (02/07 0528)  Intake/Output from previous day:  Intake/Output Summary (Last 24 hours) at 04/12/13 0751 Last data filed at 04/12/13 0528  Gross per 24 hour  Intake    480 ml  Output   1230 ml  Net   -750 ml    Intake/Output this shift:    Labs:  Recent Labs  04/09/13 1401 04/10/13 0439 04/11/13 0408 04/12/13 0439  HGB 13.0 11.1* 10.5* 10.1*    Recent Labs  04/11/13 0408 04/12/13 0439  WBC 9.3 8.9  RBC 3.57* 3.44*  HCT 32.6* 31.7*  PLT 145* 154    Recent Labs  04/10/13 0439 04/11/13 0408  NA 138 137  K 5.2 4.4  CL 105 105  CO2 22 22  BUN 38* 30*  CREATININE 1.92* 1.80*  GLUCOSE 126* 113*  CALCIUM 8.4 8.2*   No results found for this basename: LABPT, INR,  in the last 72 hours  EXAM General - Patient is Alert, Appropriate and Oriented Extremity - Neurologically intact Neurovascular intact Incision: dressing C/D/I No cellulitis present Compartment soft Dressing/Incision - clean, dry, no drainage Motor Function - intact, moving foot and toes well on exam.    Past Medical History  Diagnosis Date  . HTN (hypertension)   . Dyslipidemia   . CVA (cerebral vascular accident)   . AAA (abdominal aortic aneurysm)     Surgical repair June, 2014  . Renal artery stenosis   . Tobacco abuse   . CAD (coronary artery disease)   . Hx of CABG     2005  . Ejection fraction   . Neuropathy     Right foot  . Leg pain   . Abdominal pain   . Hyperlipidemia   . Nervousness(799.21)   . Peripheral vascular disease   . Carotid artery  occlusion   . COPD (chronic obstructive pulmonary disease) 08/15/2012  . Status post abdominal aortic aneurysm repair     June, 2014    Assessment/Plan: 3 Days Post-Op Procedure(s) (LRB): ARTHROPLASTY BIPOLAR HIP (Right) Principal Problem:   Closed displaced fracture of right femoral neck Active Problems:   HTN (hypertension)   Hx of CABG   Peripheral vascular disease, unspecified   DM2 (diabetes mellitus, type 2)   Renal failure, acute on chronic   Acute urinary retention   Postoperative anemia due to acute blood loss   Discharge to SNF  DVT Prophylaxis - Lovenox x 10 day total then change to Aspirn 325 mg daily for 4 weeks Weight Bearing As Tolerated right Leg F/U Dr. Wynelle Link in 2 weeks  Gearlean Alf 04/12/2013, 7:51 AM

## 2013-04-13 NOTE — Progress Notes (Signed)
Clinical Social Work Department CLINICAL SOCIAL WORK PLACEMENT NOTE 04/13/2013  Patient:  [name] [name], [name] [name]  Account Number:  [id number] Admit date:  04/08/2013  Clinical Social Worker:  Werner Lean, LCSW  Date/time:  04/13/2013 05:09 PM  Clinical Social Work is seeking post-discharge placement for this patient at the following level of care:   SKILLED NURSING   (*CSW will update this form in Epic as items are completed)   04/10/2013  Patient/family provided with [place] Department of Clinical Social Work's list of facilities offering this level of care within the geographic area requested by the patient (or if unable, by the patient's family).  04/10/2013  Patient/family informed of their freedom to choose among providers that offer the needed level of care, that participate in Medicare, Medicaid or managed care program needed by the patient, have an available bed and are willing to accept the patient.    Patient/family informed of [facility]' ownership interest in [facility], as well as of the fact that they are under no obligation to receive care at this facility.  PASARR submitted to EDS on 04/10/2013 PASARR number received from EDS on 04/10/2013  FL2 transmitted to all facilities in geographic area requested by pt/family on  04/10/2013 FL2 transmitted to all facilities within larger geographic area on   Patient informed that his/her managed care company has contracts with or will negotiate with  certain facilities, including the following:     Patient/family informed of bed offers received:  04/10/2013 Patient chooses bed at [facility] Physician recommends and patient chooses bed at    Patient to be transferred to [place] [facility] on  04/12/2013 Patient to be transferred to facility by EMS  The following physician request were entered in Epic:   Additional Comments:   Werner Lean LCSW [phone]

## 2013-04-14 ENCOUNTER — Non-Acute Institutional Stay (SKILLED_NURSING_FACILITY): Payer: Medicare Other | Admitting: Adult Health

## 2013-04-14 DIAGNOSIS — I10 Essential (primary) hypertension: Secondary | ICD-10-CM

## 2013-04-14 DIAGNOSIS — G629 Polyneuropathy, unspecified: Secondary | ICD-10-CM

## 2013-04-14 DIAGNOSIS — G589 Mononeuropathy, unspecified: Secondary | ICD-10-CM

## 2013-04-14 DIAGNOSIS — G4700 Insomnia, unspecified: Secondary | ICD-10-CM

## 2013-04-14 DIAGNOSIS — S72001A Fracture of unspecified part of neck of right femur, initial encounter for closed fracture: Secondary | ICD-10-CM

## 2013-04-14 DIAGNOSIS — I2510 Atherosclerotic heart disease of native coronary artery without angina pectoris: Secondary | ICD-10-CM

## 2013-04-14 DIAGNOSIS — E119 Type 2 diabetes mellitus without complications: Secondary | ICD-10-CM

## 2013-04-14 DIAGNOSIS — S72009A Fracture of unspecified part of neck of unspecified femur, initial encounter for closed fracture: Secondary | ICD-10-CM | POA: Diagnosis not present

## 2013-04-14 DIAGNOSIS — E785 Hyperlipidemia, unspecified: Secondary | ICD-10-CM

## 2013-04-14 NOTE — Progress Notes (Signed)
Patient ID: [name] [name], male   DOB: 29-Oct-1931, 80 y.o.   MRN: [id number]               PROGRESS NOTE  DATE: 04/14/2013  FACILITY: Nursing Home Location: [facility]  LEVEL OF CARE: SNF (31)  Acute Visit  CHIEF COMPLAINT:  Follow-up hospitalization  HISTORY OF PRESENT ILLNESS: This is a 80 year old male who has been admitted to [facility] on 04/12/13 from [facility]. He fell at home and sustained a right hip fracture. He is S/P Right hip arthroplasty. He has been admitted for a short-term rehabilitation.   REASSESSMENT OF ONGOING PROBLEM(S):  HTN: Pt 's HTN remains stable.  Denies CP, sob, DOE, pedal edema, headaches, dizziness or visual disturbances.  No  currently being used.  Last BP : 138/76  PERIPHERAL NEUROPATHY: The peripheral neuropathy is stable. The patient denies pain in the feet, tingling, and numbness. No complications noted from the medication presently being used.  DM:pt's DM remains stable.  Pt denies polyuria, polydipsia, polyphagia, changes in vision or hypoglycemic episodes.  No complications noted from the medication presently being used.     6/14  hemoglobin A1c is: 6.1  PAST MEDICAL HISTORY : Reviewed.  No changes.  CURRENT MEDICATIONS: Reviewed per [facility]  REVIEW OF SYSTEMS:  GENERAL: no change in appetite, no fatigue, no weight changes, no fever, chills or weakness RESPIRATORY: no cough, SOB, DOE, wheezing, hemoptysis CARDIAC: no chest pain, edema or palpitations GI: no abdominal pain, diarrhea, constipation, heart burn, nausea or vomiting  PHYSICAL EXAMINATION  VS:  T98.6       P82      RR20      BP138/76     POX97 %       GENERAL: no acute distress, normal body habitus EYES: conjunctivae normal, sclerae normal, normal eye lids NECK: supple, trachea midline, no neck masses, no thyroid tenderness, no thyromegaly LYMPHATICS: no LAN in the neck, no supraclavicular LAN RESPIRATORY: breathing is even & unlabored, BS  CTAB CARDIAC: RRR, no murmur,no extra heart sounds, no edema GI: abdomen soft, normal BS, no masses, no tenderness, no hepatomegaly, no splenomegaly EXTREMITIES: able to move BUE and LLE without difficulty but has limited ROM on RLE (due to pain) PSYCHIATRIC: the patient is alert & oriented to person, affect & behavior appropriate  LABS/RADIOLOGY: Labs reviewed: Basic Metabolic Panel:  Recent Labs  08/09/12 1513 08/09/12 1603 08/10/12 0400  04/09/13 1401 04/10/13 0439 04/11/13 0408  NA 138 138 138  < > 141 138 137  K 3.6 4.1 4.3  < > 4.5 5.2 4.4  CL  --  106 106  < > 103 105 105  CO2  --  21 21  < > 24 22 22   GLUCOSE  --  138* 136*  < > 102* 126* 113*  BUN  --  31* 35*  < > 36* 38* 30*  CREATININE  --  1.75* 1.90*  < > 2.07* 1.92* 1.80*  CALCIUM  --  8.1* 8.4  < > 9.0 8.4 8.2*  MG  --  1.7 1.7  --   --   --   --   < > = values in this interval not displayed. Liver Function Tests:  Recent Labs  08/07/12 1611 08/10/12 0400 08/19/12 1845  AST 27 26 31   ALT 20 15 21   ALKPHOS 69 47 65  BILITOT 0.6 0.7 0.4  PROT 7.1 6.0 6.2  ALBUMIN 3.5 3.3* 2.6*  Recent Labs  08/10/12 0400  AMYLASE 41   CBC:  Recent Labs  08/19/12 1845 04/08/13 1910  04/10/13 0439 04/11/13 0408 04/12/13 0439  WBC 10.8* 13.9*  < > 10.4 9.3 8.9  NEUTROABS 7.1 10.9*  --   --   --   --   HGB 9.9* 14.2  < > 11.1* 10.5* 10.1*  HCT 31.4* 44.0  < > 35.6* 32.6* 31.7*  MCV 95.2 91.1  < > 93.2 91.3 92.2  PLT 352 146*  < > 143* 145* 154  < > = values in this interval not displayed.   CBG:  Recent Labs  04/12/13 0727 04/12/13 1144 04/12/13 1700  GLUCAP 103* 109* 167*     SESSMENT/PLAN:  Right hip fracture status post right hip arthroplasty - for rehabilitation  Diabetes mellitus, type II - well-controlled; continue Onglyza and Actos  Hypertension - well controlled  CAD - stable  Insomnia - decrease Ambien 5 mg by mouth each bedtime when necessary  Hyperlipidemia - continue  Lipitor  Neuropathy - continue Neurontin    CPT CODE: 24497

## 2013-04-24 ENCOUNTER — Non-Acute Institutional Stay (SKILLED_NURSING_FACILITY): Payer: Medicare Other | Admitting: Adult Health

## 2013-04-24 DIAGNOSIS — S72009A Fracture of unspecified part of neck of unspecified femur, initial encounter for closed fracture: Secondary | ICD-10-CM | POA: Diagnosis not present

## 2013-04-24 DIAGNOSIS — E119 Type 2 diabetes mellitus without complications: Secondary | ICD-10-CM | POA: Diagnosis not present

## 2013-04-24 DIAGNOSIS — G629 Polyneuropathy, unspecified: Secondary | ICD-10-CM

## 2013-04-24 DIAGNOSIS — G589 Mononeuropathy, unspecified: Secondary | ICD-10-CM

## 2013-04-24 DIAGNOSIS — G4700 Insomnia, unspecified: Secondary | ICD-10-CM

## 2013-04-24 DIAGNOSIS — I2510 Atherosclerotic heart disease of native coronary artery without angina pectoris: Secondary | ICD-10-CM | POA: Diagnosis not present

## 2013-04-24 DIAGNOSIS — E785 Hyperlipidemia, unspecified: Secondary | ICD-10-CM

## 2013-04-24 DIAGNOSIS — I10 Essential (primary) hypertension: Secondary | ICD-10-CM | POA: Diagnosis not present

## 2013-04-24 DIAGNOSIS — S72001A Fracture of unspecified part of neck of right femur, initial encounter for closed fracture: Secondary | ICD-10-CM

## 2013-05-03 DIAGNOSIS — Z471 Aftercare following joint replacement surgery: Secondary | ICD-10-CM | POA: Diagnosis not present

## 2013-05-03 DIAGNOSIS — E1142 Type 2 diabetes mellitus with diabetic polyneuropathy: Secondary | ICD-10-CM | POA: Diagnosis not present

## 2013-05-03 DIAGNOSIS — I10 Essential (primary) hypertension: Secondary | ICD-10-CM | POA: Diagnosis not present

## 2013-05-03 DIAGNOSIS — E1149 Type 2 diabetes mellitus with other diabetic neurological complication: Secondary | ICD-10-CM | POA: Diagnosis not present

## 2013-05-03 DIAGNOSIS — Z96659 Presence of unspecified artificial knee joint: Secondary | ICD-10-CM | POA: Diagnosis not present

## 2013-05-03 DIAGNOSIS — IMO0001 Reserved for inherently not codable concepts without codable children: Secondary | ICD-10-CM | POA: Diagnosis not present

## 2013-05-05 DIAGNOSIS — Z471 Aftercare following joint replacement surgery: Secondary | ICD-10-CM | POA: Diagnosis not present

## 2013-05-05 DIAGNOSIS — E1149 Type 2 diabetes mellitus with other diabetic neurological complication: Secondary | ICD-10-CM | POA: Diagnosis not present

## 2013-05-05 DIAGNOSIS — I10 Essential (primary) hypertension: Secondary | ICD-10-CM | POA: Diagnosis not present

## 2013-05-05 DIAGNOSIS — E1142 Type 2 diabetes mellitus with diabetic polyneuropathy: Secondary | ICD-10-CM | POA: Diagnosis not present

## 2013-05-05 DIAGNOSIS — IMO0001 Reserved for inherently not codable concepts without codable children: Secondary | ICD-10-CM | POA: Diagnosis not present

## 2013-05-05 DIAGNOSIS — Z96659 Presence of unspecified artificial knee joint: Secondary | ICD-10-CM | POA: Diagnosis not present

## 2013-05-06 DIAGNOSIS — Z471 Aftercare following joint replacement surgery: Secondary | ICD-10-CM | POA: Diagnosis not present

## 2013-05-06 DIAGNOSIS — I10 Essential (primary) hypertension: Secondary | ICD-10-CM | POA: Diagnosis not present

## 2013-05-06 DIAGNOSIS — Z96659 Presence of unspecified artificial knee joint: Secondary | ICD-10-CM | POA: Diagnosis not present

## 2013-05-06 DIAGNOSIS — E1149 Type 2 diabetes mellitus with other diabetic neurological complication: Secondary | ICD-10-CM | POA: Diagnosis not present

## 2013-05-06 DIAGNOSIS — IMO0001 Reserved for inherently not codable concepts without codable children: Secondary | ICD-10-CM | POA: Diagnosis not present

## 2013-05-06 DIAGNOSIS — E1142 Type 2 diabetes mellitus with diabetic polyneuropathy: Secondary | ICD-10-CM | POA: Diagnosis not present

## 2013-05-08 DIAGNOSIS — I10 Essential (primary) hypertension: Secondary | ICD-10-CM | POA: Diagnosis not present

## 2013-05-08 DIAGNOSIS — Z96659 Presence of unspecified artificial knee joint: Secondary | ICD-10-CM | POA: Diagnosis not present

## 2013-05-08 DIAGNOSIS — E1142 Type 2 diabetes mellitus with diabetic polyneuropathy: Secondary | ICD-10-CM | POA: Diagnosis not present

## 2013-05-08 DIAGNOSIS — IMO0001 Reserved for inherently not codable concepts without codable children: Secondary | ICD-10-CM | POA: Diagnosis not present

## 2013-05-08 DIAGNOSIS — Z471 Aftercare following joint replacement surgery: Secondary | ICD-10-CM | POA: Diagnosis not present

## 2013-05-08 DIAGNOSIS — E1149 Type 2 diabetes mellitus with other diabetic neurological complication: Secondary | ICD-10-CM | POA: Diagnosis not present

## 2013-05-09 DIAGNOSIS — I10 Essential (primary) hypertension: Secondary | ICD-10-CM | POA: Diagnosis not present

## 2013-05-09 DIAGNOSIS — E1142 Type 2 diabetes mellitus with diabetic polyneuropathy: Secondary | ICD-10-CM | POA: Diagnosis not present

## 2013-05-09 DIAGNOSIS — Z96659 Presence of unspecified artificial knee joint: Secondary | ICD-10-CM | POA: Diagnosis not present

## 2013-05-09 DIAGNOSIS — Z471 Aftercare following joint replacement surgery: Secondary | ICD-10-CM | POA: Diagnosis not present

## 2013-05-09 DIAGNOSIS — IMO0001 Reserved for inherently not codable concepts without codable children: Secondary | ICD-10-CM | POA: Diagnosis not present

## 2013-05-09 DIAGNOSIS — E1149 Type 2 diabetes mellitus with other diabetic neurological complication: Secondary | ICD-10-CM | POA: Diagnosis not present

## 2013-05-12 DIAGNOSIS — E1149 Type 2 diabetes mellitus with other diabetic neurological complication: Secondary | ICD-10-CM | POA: Diagnosis not present

## 2013-05-12 DIAGNOSIS — IMO0001 Reserved for inherently not codable concepts without codable children: Secondary | ICD-10-CM | POA: Diagnosis not present

## 2013-05-12 DIAGNOSIS — Z96659 Presence of unspecified artificial knee joint: Secondary | ICD-10-CM | POA: Diagnosis not present

## 2013-05-12 DIAGNOSIS — I10 Essential (primary) hypertension: Secondary | ICD-10-CM | POA: Diagnosis not present

## 2013-05-12 DIAGNOSIS — E1142 Type 2 diabetes mellitus with diabetic polyneuropathy: Secondary | ICD-10-CM | POA: Diagnosis not present

## 2013-05-12 DIAGNOSIS — Z471 Aftercare following joint replacement surgery: Secondary | ICD-10-CM | POA: Diagnosis not present

## 2013-05-13 DIAGNOSIS — IMO0001 Reserved for inherently not codable concepts without codable children: Secondary | ICD-10-CM | POA: Diagnosis not present

## 2013-05-13 DIAGNOSIS — E1149 Type 2 diabetes mellitus with other diabetic neurological complication: Secondary | ICD-10-CM | POA: Diagnosis not present

## 2013-05-13 DIAGNOSIS — I10 Essential (primary) hypertension: Secondary | ICD-10-CM | POA: Diagnosis not present

## 2013-05-13 DIAGNOSIS — Z96659 Presence of unspecified artificial knee joint: Secondary | ICD-10-CM | POA: Diagnosis not present

## 2013-05-13 DIAGNOSIS — E1142 Type 2 diabetes mellitus with diabetic polyneuropathy: Secondary | ICD-10-CM | POA: Diagnosis not present

## 2013-05-13 DIAGNOSIS — Z471 Aftercare following joint replacement surgery: Secondary | ICD-10-CM | POA: Diagnosis not present

## 2013-05-15 DIAGNOSIS — Z96659 Presence of unspecified artificial knee joint: Secondary | ICD-10-CM | POA: Diagnosis not present

## 2013-05-15 DIAGNOSIS — E1142 Type 2 diabetes mellitus with diabetic polyneuropathy: Secondary | ICD-10-CM | POA: Diagnosis not present

## 2013-05-15 DIAGNOSIS — I10 Essential (primary) hypertension: Secondary | ICD-10-CM | POA: Diagnosis not present

## 2013-05-15 DIAGNOSIS — IMO0001 Reserved for inherently not codable concepts without codable children: Secondary | ICD-10-CM | POA: Diagnosis not present

## 2013-05-15 DIAGNOSIS — E1149 Type 2 diabetes mellitus with other diabetic neurological complication: Secondary | ICD-10-CM | POA: Diagnosis not present

## 2013-05-15 DIAGNOSIS — Z471 Aftercare following joint replacement surgery: Secondary | ICD-10-CM | POA: Diagnosis not present

## 2013-05-19 ENCOUNTER — Ambulatory Visit: Payer: Medicare Other | Admitting: Cardiology

## 2013-05-19 DIAGNOSIS — Z96659 Presence of unspecified artificial knee joint: Secondary | ICD-10-CM | POA: Diagnosis not present

## 2013-05-19 DIAGNOSIS — I10 Essential (primary) hypertension: Secondary | ICD-10-CM | POA: Diagnosis not present

## 2013-05-19 DIAGNOSIS — IMO0001 Reserved for inherently not codable concepts without codable children: Secondary | ICD-10-CM | POA: Diagnosis not present

## 2013-05-19 DIAGNOSIS — Z471 Aftercare following joint replacement surgery: Secondary | ICD-10-CM | POA: Diagnosis not present

## 2013-05-19 DIAGNOSIS — E1149 Type 2 diabetes mellitus with other diabetic neurological complication: Secondary | ICD-10-CM | POA: Diagnosis not present

## 2013-05-19 DIAGNOSIS — E1142 Type 2 diabetes mellitus with diabetic polyneuropathy: Secondary | ICD-10-CM | POA: Diagnosis not present

## 2013-05-20 ENCOUNTER — Encounter (HOSPITAL_COMMUNITY): Payer: Medicare Other

## 2013-05-20 ENCOUNTER — Ambulatory Visit: Payer: Medicare Other | Admitting: Family

## 2013-05-20 DIAGNOSIS — I10 Essential (primary) hypertension: Secondary | ICD-10-CM | POA: Diagnosis not present

## 2013-05-20 DIAGNOSIS — S72009A Fracture of unspecified part of neck of unspecified femur, initial encounter for closed fracture: Secondary | ICD-10-CM | POA: Diagnosis not present

## 2013-05-20 DIAGNOSIS — Z471 Aftercare following joint replacement surgery: Secondary | ICD-10-CM | POA: Diagnosis not present

## 2013-05-20 DIAGNOSIS — IMO0001 Reserved for inherently not codable concepts without codable children: Secondary | ICD-10-CM | POA: Diagnosis not present

## 2013-05-20 DIAGNOSIS — Z96649 Presence of unspecified artificial hip joint: Secondary | ICD-10-CM | POA: Diagnosis not present

## 2013-05-20 DIAGNOSIS — E1142 Type 2 diabetes mellitus with diabetic polyneuropathy: Secondary | ICD-10-CM | POA: Diagnosis not present

## 2013-05-20 DIAGNOSIS — Z96659 Presence of unspecified artificial knee joint: Secondary | ICD-10-CM | POA: Diagnosis not present

## 2013-05-20 DIAGNOSIS — E1149 Type 2 diabetes mellitus with other diabetic neurological complication: Secondary | ICD-10-CM | POA: Diagnosis not present

## 2013-05-22 DIAGNOSIS — E1142 Type 2 diabetes mellitus with diabetic polyneuropathy: Secondary | ICD-10-CM | POA: Diagnosis not present

## 2013-05-22 DIAGNOSIS — I10 Essential (primary) hypertension: Secondary | ICD-10-CM | POA: Diagnosis not present

## 2013-05-22 DIAGNOSIS — Z96659 Presence of unspecified artificial knee joint: Secondary | ICD-10-CM | POA: Diagnosis not present

## 2013-05-22 DIAGNOSIS — E1149 Type 2 diabetes mellitus with other diabetic neurological complication: Secondary | ICD-10-CM | POA: Diagnosis not present

## 2013-05-22 DIAGNOSIS — IMO0001 Reserved for inherently not codable concepts without codable children: Secondary | ICD-10-CM | POA: Diagnosis not present

## 2013-05-22 DIAGNOSIS — Z471 Aftercare following joint replacement surgery: Secondary | ICD-10-CM | POA: Diagnosis not present

## 2013-05-26 ENCOUNTER — Other Ambulatory Visit: Payer: Self-pay | Admitting: Cardiology

## 2013-05-26 DIAGNOSIS — E1149 Type 2 diabetes mellitus with other diabetic neurological complication: Secondary | ICD-10-CM | POA: Diagnosis not present

## 2013-05-26 DIAGNOSIS — I10 Essential (primary) hypertension: Secondary | ICD-10-CM | POA: Diagnosis not present

## 2013-05-26 DIAGNOSIS — IMO0001 Reserved for inherently not codable concepts without codable children: Secondary | ICD-10-CM | POA: Diagnosis not present

## 2013-05-26 DIAGNOSIS — Z96659 Presence of unspecified artificial knee joint: Secondary | ICD-10-CM | POA: Diagnosis not present

## 2013-05-26 DIAGNOSIS — E1142 Type 2 diabetes mellitus with diabetic polyneuropathy: Secondary | ICD-10-CM | POA: Diagnosis not present

## 2013-05-26 DIAGNOSIS — Z471 Aftercare following joint replacement surgery: Secondary | ICD-10-CM | POA: Diagnosis not present

## 2013-05-27 DIAGNOSIS — IMO0001 Reserved for inherently not codable concepts without codable children: Secondary | ICD-10-CM | POA: Diagnosis not present

## 2013-05-27 DIAGNOSIS — I10 Essential (primary) hypertension: Secondary | ICD-10-CM | POA: Diagnosis not present

## 2013-05-27 DIAGNOSIS — Z96659 Presence of unspecified artificial knee joint: Secondary | ICD-10-CM | POA: Diagnosis not present

## 2013-05-27 DIAGNOSIS — Z471 Aftercare following joint replacement surgery: Secondary | ICD-10-CM | POA: Diagnosis not present

## 2013-05-27 DIAGNOSIS — E1142 Type 2 diabetes mellitus with diabetic polyneuropathy: Secondary | ICD-10-CM | POA: Diagnosis not present

## 2013-05-27 DIAGNOSIS — E1149 Type 2 diabetes mellitus with other diabetic neurological complication: Secondary | ICD-10-CM | POA: Diagnosis not present

## 2013-05-28 DIAGNOSIS — I10 Essential (primary) hypertension: Secondary | ICD-10-CM | POA: Diagnosis not present

## 2013-05-28 DIAGNOSIS — E119 Type 2 diabetes mellitus without complications: Secondary | ICD-10-CM | POA: Diagnosis not present

## 2013-05-28 DIAGNOSIS — E7800 Pure hypercholesterolemia, unspecified: Secondary | ICD-10-CM | POA: Diagnosis not present

## 2013-05-29 DIAGNOSIS — E1149 Type 2 diabetes mellitus with other diabetic neurological complication: Secondary | ICD-10-CM | POA: Diagnosis not present

## 2013-05-29 DIAGNOSIS — I10 Essential (primary) hypertension: Secondary | ICD-10-CM | POA: Diagnosis not present

## 2013-05-29 DIAGNOSIS — E1142 Type 2 diabetes mellitus with diabetic polyneuropathy: Secondary | ICD-10-CM | POA: Diagnosis not present

## 2013-05-29 DIAGNOSIS — Z471 Aftercare following joint replacement surgery: Secondary | ICD-10-CM | POA: Diagnosis not present

## 2013-05-29 DIAGNOSIS — IMO0001 Reserved for inherently not codable concepts without codable children: Secondary | ICD-10-CM | POA: Diagnosis not present

## 2013-05-29 DIAGNOSIS — Z96659 Presence of unspecified artificial knee joint: Secondary | ICD-10-CM | POA: Diagnosis not present

## 2013-06-02 ENCOUNTER — Other Ambulatory Visit: Payer: Self-pay | Admitting: *Deleted

## 2013-06-02 DIAGNOSIS — E1142 Type 2 diabetes mellitus with diabetic polyneuropathy: Secondary | ICD-10-CM | POA: Diagnosis not present

## 2013-06-02 DIAGNOSIS — I10 Essential (primary) hypertension: Secondary | ICD-10-CM | POA: Diagnosis not present

## 2013-06-02 DIAGNOSIS — M792 Neuralgia and neuritis, unspecified: Secondary | ICD-10-CM

## 2013-06-02 DIAGNOSIS — IMO0001 Reserved for inherently not codable concepts without codable children: Secondary | ICD-10-CM | POA: Diagnosis not present

## 2013-06-02 DIAGNOSIS — Z96659 Presence of unspecified artificial knee joint: Secondary | ICD-10-CM | POA: Diagnosis not present

## 2013-06-02 DIAGNOSIS — E1149 Type 2 diabetes mellitus with other diabetic neurological complication: Secondary | ICD-10-CM | POA: Diagnosis not present

## 2013-06-02 DIAGNOSIS — Z471 Aftercare following joint replacement surgery: Secondary | ICD-10-CM | POA: Diagnosis not present

## 2013-06-02 MED ORDER — GABAPENTIN 300 MG PO CAPS: 300.0000 mg | ORAL_CAPSULE | Freq: Every day | ORAL | Status: DC

## 2013-06-03 ENCOUNTER — Encounter: Payer: Self-pay | Admitting: Adult Health

## 2013-06-03 DIAGNOSIS — E1149 Type 2 diabetes mellitus with other diabetic neurological complication: Secondary | ICD-10-CM | POA: Diagnosis not present

## 2013-06-03 DIAGNOSIS — Z96659 Presence of unspecified artificial knee joint: Secondary | ICD-10-CM | POA: Diagnosis not present

## 2013-06-03 DIAGNOSIS — I10 Essential (primary) hypertension: Secondary | ICD-10-CM | POA: Diagnosis not present

## 2013-06-03 DIAGNOSIS — Z471 Aftercare following joint replacement surgery: Secondary | ICD-10-CM | POA: Diagnosis not present

## 2013-06-03 DIAGNOSIS — E1142 Type 2 diabetes mellitus with diabetic polyneuropathy: Secondary | ICD-10-CM | POA: Diagnosis not present

## 2013-06-03 DIAGNOSIS — IMO0001 Reserved for inherently not codable concepts without codable children: Secondary | ICD-10-CM | POA: Diagnosis not present

## 2013-06-03 NOTE — Progress Notes (Signed)
Patient ID: [name] [name], male   DOB: 1932-01-04, 80 y.o.   MRN: [id number]                 PROGRESS NOTE  DATE:  04/24/13  FACILITY: Nursing Home Location: [facility]  LEVEL OF CARE: SNF (31)  Acute Visit  CHIEF COMPLAINT:  Discharge Notes  HISTORY OF PRESENT ILLNESS: This is a 80 year old male who is for discharge home with Home health PT, OT and Nursing. DME: Bedside Commode. He has been admitted to [facility] on 04/12/13 from [facility]. He fell at home and sustained a right hip fracture. He is S/P Right hip arthroplasty. Patient was admitted to this facility for short-term rehabilitation after the patient's recent hospitalization.  Patient has completed SNF rehabilitation and therapy has cleared the patient for discharge.   REASSESSMENT OF ONGOING PROBLEM(S):  HTN: Pt 's HTN remains stable.  Denies CP, sob, DOE, pedal edema, headaches, dizziness or visual disturbances.  No  currently being used.  Last BP : 118/68  CAD: The angina has been stable. The patient denies dyspnea on exertion, orthopnea, pedal edema, palpitations and paroxysmal nocturnal dyspnea. No complications noted from the medication presently being used.Marland Kitchen  DM:pt's DM remains stable.  Pt denies polyuria, polydipsia, polyphagia, changes in vision or hypoglycemic episodes.  No complications noted from the medication presently being used.     6/14  hemoglobin A1c is: 6.1  PAST MEDICAL HISTORY : Reviewed.  No changes.  CURRENT MEDICATIONS: Reviewed per [facility]  REVIEW OF SYSTEMS:  GENERAL: no change in appetite, no fatigue, no weight changes, no fever, chills or weakness RESPIRATORY: no cough, SOB, DOE, wheezing, hemoptysis CARDIAC: no chest pain, edema or palpitations GI: no abdominal pain, diarrhea, constipation, heart burn, nausea or vomiting  PHYSICAL EXAMINATION     GENERAL: no acute distress, normal body habitus EYES: conjunctivae normal, sclerae normal, normal eye lids NECK:  supple, trachea midline, no neck masses, no thyroid tenderness, no thyromegaly RESPIRATORY: breathing is even & unlabored, BS CTAB CARDIAC: RRR, no murmur,no extra heart sounds, no edema GI: abdomen soft, normal BS, no masses, no tenderness, no hepatomegaly, no splenomegaly EXTREMITIES: able to move BUE and LLE without difficulty  PSYCHIATRIC: the patient is alert & oriented to person, affect & behavior appropriate  LABS/RADIOLOGY: Labs reviewed: Basic Metabolic Panel:  Recent Labs  08/09/12 1513 08/09/12 1603 08/10/12 0400  04/09/13 1401 04/10/13 0439 04/11/13 0408  NA 138 138 138  < > 141 138 137  K 3.6 4.1 4.3  < > 4.5 5.2 4.4  CL  --  106 106  < > 103 105 105  CO2  --  21 21  < > 24 22 22   GLUCOSE  --  138* 136*  < > 102* 126* 113*  BUN  --  31* 35*  < > 36* 38* 30*  CREATININE  --  1.75* 1.90*  < > 2.07* 1.92* 1.80*  CALCIUM  --  8.1* 8.4  < > 9.0 8.4 8.2*  MG  --  1.7 1.7  --   --   --   --   < > = values in this interval not displayed. Liver Function Tests:  Recent Labs  08/07/12 1611 08/10/12 0400 08/19/12 1845  AST 27 26 31   ALT 20 15 21   ALKPHOS 69 47 65  BILITOT 0.6 0.7 0.4  PROT 7.1 6.0 6.2  ALBUMIN 3.5 3.3* 2.6*    Recent Labs  08/10/12 0400  AMYLASE 41   CBC:  Recent Labs  08/19/12 1845 04/08/13 1910  04/10/13 0439 04/11/13 0408 04/12/13 0439  WBC 10.8* 13.9*  < > 10.4 9.3 8.9  NEUTROABS 7.1 10.9*  --   --   --   --   HGB 9.9* 14.2  < > 11.1* 10.5* 10.1*  HCT 31.4* 44.0  < > 35.6* 32.6* 31.7*  MCV 95.2 91.1  < > 93.2 91.3 92.2  PLT 352 146*  < > 143* 145* 154  < > = values in this interval not displayed.   CBG:  Recent Labs  04/12/13 0727 04/12/13 1144 04/12/13 1700  GLUCAP 103* 109* 167*     SESSMENT/PLAN:  Right hip fracture status post right hip arthroplasty - for Home health PT, OT and Nursing  Diabetes mellitus, type II - well-controlled; continue Onglyza and Actos  Hypertension - well controlled  CAD -  stable  Insomnia - stable  Hyperlipidemia - continue Lipitor  Neuropathy - continue Neurontin   I have filled out patient's discharge paperwork and written prescriptions.  Patient will receive home health PT, OT and Nursing. . DME provided: Bedside Commode  Total discharge time: Greater than 30 minutes Discharge time involved coordination of the discharge process with social worker, nursing staff and therapy department. Medical justification for home health services/DME verified.   CPT CODE: 40347   Seth Bake - NP [facility] [phone]

## 2013-06-05 DIAGNOSIS — I10 Essential (primary) hypertension: Secondary | ICD-10-CM | POA: Diagnosis not present

## 2013-06-05 DIAGNOSIS — E1142 Type 2 diabetes mellitus with diabetic polyneuropathy: Secondary | ICD-10-CM | POA: Diagnosis not present

## 2013-06-05 DIAGNOSIS — IMO0001 Reserved for inherently not codable concepts without codable children: Secondary | ICD-10-CM | POA: Diagnosis not present

## 2013-06-05 DIAGNOSIS — Z471 Aftercare following joint replacement surgery: Secondary | ICD-10-CM | POA: Diagnosis not present

## 2013-06-05 DIAGNOSIS — Z96659 Presence of unspecified artificial knee joint: Secondary | ICD-10-CM | POA: Diagnosis not present

## 2013-06-05 DIAGNOSIS — E1149 Type 2 diabetes mellitus with other diabetic neurological complication: Secondary | ICD-10-CM | POA: Diagnosis not present

## 2013-06-09 DIAGNOSIS — E1142 Type 2 diabetes mellitus with diabetic polyneuropathy: Secondary | ICD-10-CM | POA: Diagnosis not present

## 2013-06-09 DIAGNOSIS — I10 Essential (primary) hypertension: Secondary | ICD-10-CM | POA: Diagnosis not present

## 2013-06-09 DIAGNOSIS — E1149 Type 2 diabetes mellitus with other diabetic neurological complication: Secondary | ICD-10-CM | POA: Diagnosis not present

## 2013-06-09 DIAGNOSIS — IMO0001 Reserved for inherently not codable concepts without codable children: Secondary | ICD-10-CM | POA: Diagnosis not present

## 2013-06-09 DIAGNOSIS — Z471 Aftercare following joint replacement surgery: Secondary | ICD-10-CM | POA: Diagnosis not present

## 2013-06-09 DIAGNOSIS — Z96659 Presence of unspecified artificial knee joint: Secondary | ICD-10-CM | POA: Diagnosis not present

## 2013-06-12 DIAGNOSIS — E1142 Type 2 diabetes mellitus with diabetic polyneuropathy: Secondary | ICD-10-CM | POA: Diagnosis not present

## 2013-06-12 DIAGNOSIS — Z96659 Presence of unspecified artificial knee joint: Secondary | ICD-10-CM | POA: Diagnosis not present

## 2013-06-12 DIAGNOSIS — E1149 Type 2 diabetes mellitus with other diabetic neurological complication: Secondary | ICD-10-CM | POA: Diagnosis not present

## 2013-06-12 DIAGNOSIS — IMO0001 Reserved for inherently not codable concepts without codable children: Secondary | ICD-10-CM | POA: Diagnosis not present

## 2013-06-12 DIAGNOSIS — Z471 Aftercare following joint replacement surgery: Secondary | ICD-10-CM | POA: Diagnosis not present

## 2013-06-12 DIAGNOSIS — I10 Essential (primary) hypertension: Secondary | ICD-10-CM | POA: Diagnosis not present

## 2013-06-13 ENCOUNTER — Encounter: Payer: Self-pay | Admitting: Cardiology

## 2013-06-13 ENCOUNTER — Ambulatory Visit (INDEPENDENT_AMBULATORY_CARE_PROVIDER_SITE_OTHER): Payer: Medicare Other | Admitting: Cardiology

## 2013-06-13 VITALS — BP 97/65 | HR 73 | Ht 70.0 in | Wt 206.8 lb

## 2013-06-13 DIAGNOSIS — Z720 Tobacco use: Secondary | ICD-10-CM

## 2013-06-13 DIAGNOSIS — I10 Essential (primary) hypertension: Secondary | ICD-10-CM | POA: Diagnosis not present

## 2013-06-13 DIAGNOSIS — I2510 Atherosclerotic heart disease of native coronary artery without angina pectoris: Secondary | ICD-10-CM | POA: Diagnosis not present

## 2013-06-13 DIAGNOSIS — F17200 Nicotine dependence, unspecified, uncomplicated: Secondary | ICD-10-CM | POA: Diagnosis not present

## 2013-06-13 NOTE — Assessment & Plan Note (Signed)
The patient has stop smoking. I applauded him for this.

## 2013-06-13 NOTE — Assessment & Plan Note (Signed)
His blood pressure today is 97 systolic. We are trying to verify his home meds exactly. His wife will be back in touch with [place] soon. It is not clear if he is actually on amlodipine 5 mg daily or not. He is on lisinopril 20 mg daily. If he is on amlodipine at home, this will be stopped. If he is not on amlodipine, his lisinopril will be stopped. We will then up arrange for followup blood pressures.

## 2013-06-13 NOTE — Assessment & Plan Note (Signed)
Coronary disease is stable. He does not in any further workup at this time.

## 2013-06-13 NOTE — Progress Notes (Signed)
Patient ID: [name] [name], male   DOB: 03/08/1931, 80 y.o.   MRN: [id number]    HPI  Patient is seen for cardiology followup. I saw him last in June, 2014. He has coronary disease. However he's been very stable. He underwent urgent abdominal aortic aneurysm repair in 2014. We knew from a nuclear study in 2013 that he had no marked ischemia. His ejection fraction has been normal. More recently he had a fractured hip. He had this repaired. He is now going through rehabilitation.  He has noted that his pressure has been low. Blood pressure is low here today. Unfortunately at this moment we do not have the exact list of his home meds. We will work with him to determine this and then recommend changes that are necessary for his low blood pressure.  No Known Allergies  Current Outpatient Prescriptions  Medication Sig Dispense Refill  . amLODipine (NORVASC) 5 MG tablet Take 1 tablet (5 mg total) by mouth daily.  30 tablet  0  . aspirin 81 MG tablet Take 81 mg by mouth daily.      Marland Kitchen atorvastatin (LIPITOR) 40 MG tablet Take 40 mg by mouth daily.       . cholecalciferol (VITAMIN D) 1000 UNITS tablet Take 1,000 Units by mouth daily.       . clopidogrel (PLAVIX) 75 MG tablet Take 1 tablet by mouth daily.      Marland Kitchen gabapentin (NEURONTIN) 300 MG capsule Take 1 capsule (300 mg total) by mouth daily.  30 capsule  1  . lisinopril (PRINIVIL,ZESTRIL) 20 MG tablet TAKE 1 TABLET ONCE DAILY.  30 tablet  2  . oxybutynin (DITROPAN-XL) 5 MG 24 hr tablet Take 5 mg by mouth daily.       . pioglitazone (ACTOS) 15 MG tablet Take 7.5 mg by mouth daily.       . saxagliptin HCl (ONGLYZA) 5 MG TABS tablet Take 5 mg by mouth daily.      . traMADol (ULTRAM) 50 MG tablet Take 1 tablet by mouth every 6 (six) hours as needed.      . vitamin B-12 (CYANOCOBALAMIN) 100 MCG tablet Take 100 mcg by mouth daily.       Marland Kitchen zolpidem (AMBIEN) 10 MG tablet Take 5 mg by mouth at bedtime as needed.       No current facility-administered  medications for this visit.    History   Social History  . Marital Status: Married    Spouse Name: N/A    Number of Children: N/A  . Years of Education: N/A   Occupational History  . retired    Social History Main Topics  . Smoking status: Former Smoker -- 0.50 packs/day for 50 years    Types: Cigarettes    Quit date: 08/05/2012  . Smokeless tobacco: Never Used  . Alcohol Use: Yes     Comment: occasionally  . Drug Use: No  . Sexual Activity: Not on file   Other Topics Concern  . Not on file   Social History Narrative  . No narrative on file    Family History  Problem Relation Age of Onset  . Tuberculosis Mother   . Coronary artery disease Father     Past Medical History  Diagnosis Date  . HTN (hypertension)   . Dyslipidemia   . CVA (cerebral vascular accident)   . AAA (abdominal aortic aneurysm)     Surgical repair June, 2014  . Renal artery stenosis   . Tobacco  abuse   . CAD (coronary artery disease)   . Hx of CABG     2005  . Ejection fraction   . Neuropathy     Right foot  . Leg pain   . Abdominal pain   . Hyperlipidemia   . Nervousness(799.21)   . Peripheral vascular disease   . Carotid artery occlusion   . COPD (chronic obstructive pulmonary disease) 08/15/2012  . Status post abdominal aortic aneurysm repair     June, 2014    Past Surgical History  Procedure Laterality Date  . Coronary artery bypass graft  2005    x4  . Spine surgery  1977    Lumbar HNP surgery  . Aorta - bilateral femoral artery bypass graft N/A 08/09/2012    Procedure: AORTA BIFEMORAL BYPASS GRAFT;  Surgeon: Rosetta Posner, MD;  Location: [place];  Service: Vascular;  Laterality: N/A;  . Hip arthroplasty Right 04/09/2013    Procedure: ARTHROPLASTY BIPOLAR HIP;  Surgeon: Gearlean Alf, MD;  Location: [facility];  Service: Orthopedics;  Laterality: Right;    Patient Active Problem List   Diagnosis Date Noted  . Hip fracture, right  S/P hip arthroplasty 04/14/2013  . Insomnia  04/14/2013  . Postoperative anemia due to acute blood loss 04/10/2013  . Renal failure, acute on chronic 04/09/2013  . Acute urinary retention 04/09/2013  . Closed displaced fracture of right femoral neck 04/08/2013  . DM2 (diabetes mellitus, type 2) 04/08/2013  . Aftercare following surgery of the circulatory system, [place] 09/03/2012  . Peripheral vascular disease, unspecified 09/03/2012  . PAD (peripheral artery disease)--bifemoral bypass graft. 08/20/2012  . Fluid overload 08/20/2012  . Heart block AV second degree - episodic 08/16/2012  . COPD (chronic obstructive pulmonary disease) 08/15/2012  . Dyspnea 08/15/2012  . Abdominal aneurysm without mention of rupture 02/07/2012  . CAD (coronary artery disease)   . Neuropathy   . HTN (hypertension)   . Dyslipidemia   . CVA (cerebral vascular accident)   . AAA (abdominal aortic aneurysm)   . Renal artery stenosis   . Tobacco abuse   . Hx of CABG   . Ejection fraction     ROS   Patient denies fever, chills, headache, sweats, rash, change in vision, change in hearing, chest pain, cough, nausea or vomiting, urinary symptoms. All other systems are reviewed and are negative.  PHYSICAL EXAM  Patient is stable. He is here with his wife. He walks with a cane. He is oriented to person time and place. Affect is normal. There is no jugulovenous distention. Lungs are clear. Respiratory effort is nonlabored. Cardiac exam reveals S1 and S2. There no clicks or significant murmurs. The abdomen is soft. There is no peripheral edema. There are no skin rashes.  Filed Vitals:   06/13/13 1537  BP: 97/65  Pulse: 73  Height: 5\' 10"  (1.778 m)  Weight: 206 lb 12.8 oz (93.804 kg)   I reviewed the EKGs from February, 2015. There was no significant change.  ASSESSMENT & PLAN

## 2013-06-13 NOTE — Patient Instructions (Addendum)
**Note De-Identified  Obfuscation** Please call Jeani Hawking (Dr Kae Heller nurse) at [phone] either today when you get home or Monday morning so we can determine what medications you are taking.  Your physician recommends that you schedule a follow-up appointment in: 4 weeks

## 2013-06-16 ENCOUNTER — Other Ambulatory Visit: Payer: Self-pay

## 2013-06-16 ENCOUNTER — Telehealth: Payer: Self-pay | Admitting: Cardiology

## 2013-06-16 NOTE — Telephone Encounter (Signed)
New Message  Pt called back to discuss medication/ States that he was taking two forms of BP medications. He states he was taking Amlodipine with lisinopril. He says that he will stop taking Amlodipine and requests a call back to discuss further. Please call

## 2013-06-16 NOTE — Telephone Encounter (Addendum)
**Note De-Identified  Obfuscation** At the pts last OV with Dr Ron Parker on 06/13/13 it was unclear what medications the pt was taking as the pt did not bring his medications and could not remember what he takes and his BP was low at 97/65. He was instructed to call me to let us know what he was taking. The pt states that he was taking both Amlodipine and Lisinopril but has now stopped taking the Amlodipine as advised by Dr Ron Parker. Dr Ron Parker is aware and Amlodipine has been removed from the pts med list.

## 2013-06-25 DIAGNOSIS — E119 Type 2 diabetes mellitus without complications: Secondary | ICD-10-CM | POA: Diagnosis not present

## 2013-06-25 DIAGNOSIS — I10 Essential (primary) hypertension: Secondary | ICD-10-CM | POA: Diagnosis not present

## 2013-07-01 DIAGNOSIS — IMO0001 Reserved for inherently not codable concepts without codable children: Secondary | ICD-10-CM | POA: Diagnosis not present

## 2013-07-01 DIAGNOSIS — Z96659 Presence of unspecified artificial knee joint: Secondary | ICD-10-CM | POA: Diagnosis not present

## 2013-07-01 DIAGNOSIS — I10 Essential (primary) hypertension: Secondary | ICD-10-CM | POA: Diagnosis not present

## 2013-07-01 DIAGNOSIS — E1142 Type 2 diabetes mellitus with diabetic polyneuropathy: Secondary | ICD-10-CM | POA: Diagnosis not present

## 2013-07-01 DIAGNOSIS — Z471 Aftercare following joint replacement surgery: Secondary | ICD-10-CM | POA: Diagnosis not present

## 2013-07-01 DIAGNOSIS — E1149 Type 2 diabetes mellitus with other diabetic neurological complication: Secondary | ICD-10-CM | POA: Diagnosis not present

## 2013-07-11 ENCOUNTER — Ambulatory Visit: Payer: Medicare Other | Admitting: Cardiology

## 2013-07-15 DIAGNOSIS — G9009 Other idiopathic peripheral autonomic neuropathy: Secondary | ICD-10-CM | POA: Diagnosis not present

## 2013-07-15 DIAGNOSIS — E1149 Type 2 diabetes mellitus with other diabetic neurological complication: Secondary | ICD-10-CM | POA: Diagnosis not present

## 2013-07-15 DIAGNOSIS — E1159 Type 2 diabetes mellitus with other circulatory complications: Secondary | ICD-10-CM | POA: Diagnosis not present

## 2013-07-15 DIAGNOSIS — L608 Other nail disorders: Secondary | ICD-10-CM | POA: Diagnosis not present

## 2013-07-30 ENCOUNTER — Telehealth: Payer: Self-pay | Admitting: *Deleted

## 2013-07-30 NOTE — Telephone Encounter (Signed)
Received a fax refill request from [id number] for Gabapentin 300 mg one daily for [name] [name].We last saw him in Sept of 2014.  Patient had a follow up appt scheduled with our nurse practioner in March of this year but evidently was still recovering from a hip surgery and could not come to the office. I have filled his Gabapentin 300mg  #60 (2 months worth if still taking 1 daily) and I asked the pharmacist to instruct the patient to make another follow up appt asap.

## 2013-08-14 ENCOUNTER — Encounter: Payer: Self-pay | Admitting: Family

## 2013-08-15 ENCOUNTER — Encounter: Payer: Self-pay | Admitting: Family

## 2013-08-15 ENCOUNTER — Ambulatory Visit (HOSPITAL_COMMUNITY)
Admission: RE | Admit: 2013-08-15 | Discharge: 2013-08-15 | Disposition: A | Discharge status: Home / Self Care | Payer: Medicare Other | Source: Ambulatory Visit | Attending: Family | Admitting: Family

## 2013-08-15 ENCOUNTER — Ambulatory Visit (INDEPENDENT_AMBULATORY_CARE_PROVIDER_SITE_OTHER): Payer: Medicare Other | Admitting: Family

## 2013-08-15 VITALS — BP 101/70 | HR 56 | Resp 14 | Ht 70.0 in | Wt 200.0 lb

## 2013-08-15 DIAGNOSIS — I714 Abdominal aortic aneurysm, without rupture, unspecified: Secondary | ICD-10-CM | POA: Diagnosis not present

## 2013-08-15 DIAGNOSIS — I739 Peripheral vascular disease, unspecified: Secondary | ICD-10-CM | POA: Insufficient documentation

## 2013-08-15 DIAGNOSIS — Z48812 Encounter for surgical aftercare following surgery on the circulatory system: Secondary | ICD-10-CM

## 2013-08-15 DIAGNOSIS — I6529 Occlusion and stenosis of unspecified carotid artery: Secondary | ICD-10-CM | POA: Diagnosis not present

## 2013-08-15 NOTE — Patient Instructions (Signed)
Peripheral Vascular Disease Peripheral Vascular Disease (PVD), also called Peripheral Arterial Disease (PAD), is a circulation problem caused by cholesterol (atherosclerotic plaque) deposits in the arteries. PVD commonly occurs in the lower extremities (legs) but it can occur in other areas of the body, such as your arms. The cholesterol buildup in the arteries reduces blood flow which can cause pain and other serious problems. The presence of PVD can place a person at risk for Coronary Artery Disease (CAD).  CAUSES  Causes of PVD can be many. It is usually associated with more than one risk factor such as:   High Cholesterol.  Smoking.  Diabetes.  Lack of exercise or inactivity.  High blood pressure (hypertension).  Obesity.  Family history. SYMPTOMS   When the lower extremities are affected, patients with PVD may experience:  Leg pain with exertion or physical activity. This is called INTERMITTENT CLAUDICATION. This may present as cramping or numbness with physical activity. The location of the pain is associated with the level of blockage. For example, blockage at the abdominal level (distal abdominal aorta) may result in buttock or hip pain. Lower leg arterial blockage may result in calf pain.  As PVD becomes more severe, pain can develop with less physical activity.  In people with severe PVD, leg pain may occur at rest.  Other PVD signs and symptoms:  Leg numbness or weakness.  Coldness in the affected leg or foot, especially when compared to the other leg.  A change in leg color.  Patients with significant PVD are more prone to ulcers or sores on toes, feet or legs. These may take longer to heal or may reoccur. The ulcers or sores can become infected.  If signs and symptoms of PVD are ignored, gangrene may occur. This can result in the loss of toes or loss of an entire limb.  Not all leg pain is related to PVD. Other medical conditions can cause leg pain such  as:  Blood clots (embolism) or Deep Vein Thrombosis.  Inflammation of the blood vessels (vasculitis).  Spinal stenosis. DIAGNOSIS  Diagnosis of PVD can involve several different types of tests. These can include:  Pulse Volume Recording Method (PVR). This test is simple, painless and does not involve the use of X-rays. PVR involves measuring and comparing the blood pressure in the arms and legs. An ABI (Ankle-Brachial Index) is calculated. The normal ratio of blood pressures is 1. As this number becomes smaller, it indicates more severe disease.  < 0.95  indicates significant narrowing in one or more leg vessels.  <0.8 there will usually be pain in the foot, leg or buttock with exercise.  <0.4 will usually have pain in the legs at rest.  <0.25  usually indicates limb threatening PVD.  Doppler detection of pulses in the legs. This test is painless and checks to see if you have a pulses in your legs/feet.  A dye or contrast material (a substance that highlights the blood vessels so they show up on x-ray) may be given to help your caregiver better see the arteries for the following tests. The dye is eliminated from your body by the kidney's. Your caregiver may order blood work to check your kidney function and other laboratory values before the following tests are performed:  Magnetic Resonance Angiography (MRA). An MRA is a picture study of the blood vessels and arteries. The MRA machine uses a large magnet to produce images of the blood vessels.  Computed Tomography Angiography (CTA). A CTA is a   specialized x-ray that looks at how the blood flows in your blood vessels. An IV may be inserted into your arm so contrast dye can be injected.  Angiogram. Is a procedure that uses x-rays to look at your blood vessels. This procedure is minimally invasive, meaning a small incision (cut) is made in your groin. A small tube (catheter) is then inserted into the artery of your groin. The catheter is  guided to the blood vessel or artery your caregiver wants to examine. Contrast dye is injected into the catheter. X-rays are then taken of the blood vessel or artery. After the images are obtained, the catheter is taken out. TREATMENT  Treatment of PVD involves many interventions which may include:  Lifestyle changes:  Quitting smoking.  Exercise.  Following a low fat, low cholesterol diet.  Control of diabetes.  Foot care is very important to the PVD patient. Good foot care can help prevent infection.  Medication:  Cholesterol-lowering medicine.  Blood pressure medicine.  Anti-platelet drugs.  Certain medicines may reduce symptoms of Intermittent Claudication.  Interventional/Surgical options:  Angioplasty. An Angioplasty is a procedure that inflates a balloon in the blocked artery. This opens the blocked artery to improve blood flow.  Stent Implant. A wire mesh tube (stent) is placed in the artery. The stent expands and stays in place, allowing the artery to remain open.  Peripheral Bypass Surgery. This is a surgical procedure that reroutes the blood around a blocked artery to help improve blood flow. This type of procedure may be performed if Angioplasty or stent implants are not an option. SEEK IMMEDIATE MEDICAL CARE IF:   You develop pain or numbness in your arms or legs.  Your arm or leg turns cold, becomes blue in color.  You develop redness, warmth, swelling and pain in your arms or legs. MAKE SURE YOU:   Understand these instructions.  Will watch your condition.  Will get help right away if you are not doing well or get worse. Document Released: 03/30/2004 Document Revised: 05/15/2011 Document Reviewed: 02/25/2008 [employer] Patient Information 2014 [employer].  

## 2013-08-15 NOTE — Progress Notes (Signed)
[facility] [place] HISTORY AND PHYSICAL   MRN : [id number]  History of Present Illness:   [name] [name] is a 80 y.o. male patient of Dr. Donnetta Hutching who presents today for a followup of urgent aortobifemoral bypass on 08/09/12. He presented with a critical ischemia of his right lower extremity. He also had a known 5 cm aneurysm. He did well with the surgery. He does continue to have  intermitent neuropathic-type pain in his right foot. This is improving slowly as well.  He fell February, 2015, had right hip fracture and surgical repair, is recovering fairly well from this, using a cane to walk. After walking about 10 minutes his right thigh and calf starts to cramp relieved with rest, denies non healing wounds. He denies back or abdominal pain.  He had a stroke in 1997 with right hemiparesis, denies aphasia or confusion, denies monocular blindness; he has right arm and leg weakness; he denies any further stroke or TIA activity since then.  Pt Diabetic: Yes, states is in fairly good control. Pt smoker: former smoker, quit in 2014 Pt meds include: Statin :Yes ASA: Yes Other anticoagulants/antiplatelets: Plavix  Current Outpatient Prescriptions  Medication Sig Dispense Refill  . amLODipine (NORVASC) 5 MG tablet Take 5 mg by mouth daily.      Marland Kitchen aspirin 81 MG tablet Take 81 mg by mouth daily.      Marland Kitchen atorvastatin (LIPITOR) 40 MG tablet Take 40 mg by mouth daily.       . cholecalciferol (VITAMIN D) 1000 UNITS tablet Take 1,000 Units by mouth daily.       . clopidogrel (PLAVIX) 75 MG tablet Take 1 tablet by mouth daily.      . furosemide (LASIX) 20 MG tablet Take 10 mg by mouth daily.      Marland Kitchen gabapentin (NEURONTIN) 300 MG capsule Take 1 capsule (300 mg total) by mouth daily.  30 capsule  1  . lisinopril (PRINIVIL,ZESTRIL) 20 MG tablet TAKE 1 TABLET ONCE DAILY.  30 tablet  2  . oxybutynin (DITROPAN-XL) 5 MG 24 hr tablet Take 5 mg by mouth daily.       . saxagliptin HCl  (ONGLYZA) 5 MG TABS tablet Take 2.5 mg by mouth daily.       . traMADol (ULTRAM) 50 MG tablet Take 1 tablet by mouth every 6 (six) hours as needed.      . vitamin B-12 (CYANOCOBALAMIN) 100 MCG tablet Take 100 mcg by mouth daily.       Marland Kitchen zolpidem (AMBIEN) 10 MG tablet Take 5 mg by mouth at bedtime as needed.      . pioglitazone (ACTOS) 15 MG tablet Take 7.5 mg by mouth daily.        No current facility-administered medications for this visit.     Past Medical History  Diagnosis Date  . HTN (hypertension)   . Dyslipidemia   . CVA (cerebral vascular accident)   . AAA (abdominal aortic aneurysm)     Surgical repair June, 2014  . Renal artery stenosis   . Tobacco abuse   . CAD (coronary artery disease)   . Hx of CABG     2005  . Ejection fraction   . Neuropathy     Right foot  . Leg pain   . Abdominal pain   . Hyperlipidemia   . Nervousness(799.21)   . Peripheral vascular disease   . Carotid artery occlusion   . COPD (chronic obstructive pulmonary disease) 08/15/2012  .  Status post abdominal aortic aneurysm repair     June, 2014  . Fall at home Feb.4, 2015    Fx Right Hip  . Diabetes mellitus without complication     Past Surgical History  Procedure Laterality Date  . Coronary artery bypass graft  2005    x4  . Spine surgery  1977    Lumbar HNP surgery  . Aorta - bilateral femoral artery bypass graft N/A 08/09/2012    Procedure: AORTA BIFEMORAL BYPASS GRAFT;  Surgeon: Rosetta Posner, MD;  Location: [facility];  Service: Vascular;  Laterality: N/A;  . Hip arthroplasty Right 04/09/2013    Procedure: ARTHROPLASTY BIPOLAR HIP;  Surgeon: Gearlean Alf, MD;  Location: [facility];  Service: Orthopedics;  Laterality: Right;  . Joint replacement Right Feb. 4, 2015    Hip- Pt fell    Social History History  Substance Use Topics  . Smoking status: Former Smoker -- 0.50 packs/day for 50 years    Types: Cigarettes    Quit date: 08/05/2012  . Smokeless tobacco: Never Used  . Alcohol Use:  Yes     Comment: occasionally    Family History Family History  Problem Relation Age of Onset  . Tuberculosis Mother   . Coronary artery disease Father   . Varicose Veins Father     No Known Allergies   REVIEW OF SYSTEMS: See HPI for pertinent positives and negatives.  Physical Examination Filed Vitals:   08/15/13 1044  BP: 101/70  Pulse: 56  Resp: 14  Height: 5\' 10"  (1.778 m)  Weight: 200 lb (90.719 kg)  SpO2: 96%   Body mass index is 28.7 kg/(m^2).  General:  WDWN in NAD Gait: Normal HENT: WNL Eyes: Pupils equal Pulmonary: normal non-labored breathing , without Rales, rhonchi,  wheezing Cardiac: RRR, no murmurs detected  Abdomen: soft, NT, no masses Skin: no rashes, ulcers noted;  no Gangrene , no cellulitis; no open wounds;   VASCULAR EXAM  Carotid Bruits Left Right   Negative Negative             Radial pulses are 2+ palpable and =                VASCULAR EXAM: Extremities without ischemic changes  without Gangrene; without open wounds. Is hypersensitive to touching right foot.                                                                                                          LE Pulses LEFT RIGHT       FEMORAL  3+ palpable  3+ palpable        POPLITEAL  not palpable   not palpable       POSTERIOR TIBIAL  not palpable   not palpable        DORSALIS PEDIS      ANTERIOR TIBIAL not palpable  not palpable      Musculoskeletal: no muscle wasting or atrophy; 1+ edema in right ankle Neurologic: A&O X 3; Appropriate Affect ;  SENSATION: normal; MOTOR FUNCTION: 5/5  in left UE, 4/5 in RUE and LLE, 3/5 in RLE, CN 2-12 intact Speech is fluent/normal   Non-Invasive Vascular Imaging (08/15/2013): ABI's: Right: 0.55, absent PT and monophasic DP waveforms; Left: 0.70, biphasic PT and monophasic DP Previous (09/03/2012) ABI's: Right: 0.43, Left: 0.69  ASSESSMENT:  [name] is a 80 y.o. male who is s/p urgent aortobifemoral bypass on 08/09/12.  He presented with a critical ischemia of his right lower extremity. He also had a known 5 cm aneurysm. He did well with the surgery.  After walking about 10 minutes his right thigh and calf starts to cramp relieved with rest, denies non healing wounds. His ABI's are slightly improved from a year ago in his right leg, left leg remains at moderate arterial occlusive disease.  History of stroke in 1997, no stroke or TIA activity since then, no carotid Duplex results on file, will obtain when he returns in 6 months for ABI's.  PLAN:   He is at risk for falling, chair exercises daily as discussed for legs and arms. Based on today's exam and non-invasive vascular lab results, the patient will follow up in 6 months with the following tests ABI's and carotid Duplex. I discussed in depth with the patient the nature of atherosclerosis, and emphasized the importance of maximal medical management including strict control of blood pressure, blood glucose, and lipid levels, obtaining regular exercise, and cessation of smoking.  The patient is aware that without maximal medical management the underlying atherosclerotic disease process will progress, limiting the benefit of any interventions. The patient was given information about stroke prevention and what symptoms should prompt the patient to seek immediate medical care.  The patient was given information about PAD including signs, symptoms, treatment, what symptoms should prompt the patient to seek immediate medical care, and risk reduction measures to take. Thank you for allowing [place] to participate in this patient's care.  Clemon Chambers, RN, MSN, FNP-C [facility] Office: [phone]  Clinic MD: Bridgett Larsson 08/15/2013 10:47 AM

## 2013-08-31 ENCOUNTER — Other Ambulatory Visit: Payer: Self-pay | Admitting: Cardiology

## 2013-10-06 ENCOUNTER — Other Ambulatory Visit: Payer: Self-pay | Admitting: *Deleted

## 2013-10-06 DIAGNOSIS — M792 Neuralgia and neuritis, unspecified: Secondary | ICD-10-CM

## 2013-10-06 MED ORDER — GABAPENTIN 300 MG PO CAPS: 300.0000 mg | ORAL_CAPSULE | Freq: Every day | ORAL | Status: DC

## 2013-10-09 ENCOUNTER — Other Ambulatory Visit: Payer: Self-pay | Admitting: Cardiology

## 2013-10-21 DIAGNOSIS — E119 Type 2 diabetes mellitus without complications: Secondary | ICD-10-CM | POA: Diagnosis not present

## 2013-10-21 DIAGNOSIS — M79609 Pain in unspecified limb: Secondary | ICD-10-CM | POA: Diagnosis not present

## 2013-10-21 DIAGNOSIS — F329 Major depressive disorder, single episode, unspecified: Secondary | ICD-10-CM | POA: Diagnosis not present

## 2013-10-21 DIAGNOSIS — F3289 Other specified depressive episodes: Secondary | ICD-10-CM | POA: Diagnosis not present

## 2013-10-21 DIAGNOSIS — E7800 Pure hypercholesterolemia, unspecified: Secondary | ICD-10-CM | POA: Diagnosis not present

## 2013-10-21 DIAGNOSIS — I10 Essential (primary) hypertension: Secondary | ICD-10-CM | POA: Diagnosis not present

## 2013-10-30 ENCOUNTER — Encounter: Payer: Self-pay | Admitting: Cardiology

## 2013-10-31 ENCOUNTER — Ambulatory Visit: Payer: Medicare Other | Admitting: Cardiology

## 2013-11-01 ENCOUNTER — Other Ambulatory Visit: Payer: Self-pay | Admitting: Adult Health

## 2013-11-01 ENCOUNTER — Other Ambulatory Visit: Payer: Self-pay | Admitting: Cardiology

## 2013-11-19 DIAGNOSIS — E119 Type 2 diabetes mellitus without complications: Secondary | ICD-10-CM | POA: Diagnosis not present

## 2013-11-19 DIAGNOSIS — E7800 Pure hypercholesterolemia, unspecified: Secondary | ICD-10-CM | POA: Diagnosis not present

## 2013-11-19 DIAGNOSIS — I10 Essential (primary) hypertension: Secondary | ICD-10-CM | POA: Diagnosis not present

## 2013-11-19 DIAGNOSIS — M25549 Pain in joints of unspecified hand: Secondary | ICD-10-CM | POA: Diagnosis not present

## 2013-11-25 DIAGNOSIS — M2550 Pain in unspecified joint: Secondary | ICD-10-CM | POA: Diagnosis not present

## 2013-11-25 DIAGNOSIS — M7989 Other specified soft tissue disorders: Secondary | ICD-10-CM | POA: Diagnosis not present

## 2013-11-25 DIAGNOSIS — Z23 Encounter for immunization: Secondary | ICD-10-CM | POA: Diagnosis not present

## 2013-12-08 ENCOUNTER — Other Ambulatory Visit: Payer: Self-pay | Admitting: Cardiology

## 2013-12-10 DIAGNOSIS — M7989 Other specified soft tissue disorders: Secondary | ICD-10-CM | POA: Diagnosis not present

## 2013-12-10 DIAGNOSIS — M2550 Pain in unspecified joint: Secondary | ICD-10-CM | POA: Diagnosis not present

## 2013-12-29 ENCOUNTER — Ambulatory Visit (INDEPENDENT_AMBULATORY_CARE_PROVIDER_SITE_OTHER): Payer: Medicare Other | Admitting: Cardiology

## 2013-12-29 ENCOUNTER — Encounter: Payer: Self-pay | Admitting: Cardiology

## 2013-12-29 VITALS — BP 110/60 | HR 59 | Ht 70.0 in | Wt 205.0 lb

## 2013-12-29 DIAGNOSIS — I714 Abdominal aortic aneurysm, without rupture, unspecified: Secondary | ICD-10-CM

## 2013-12-29 DIAGNOSIS — I10 Essential (primary) hypertension: Secondary | ICD-10-CM

## 2013-12-29 DIAGNOSIS — I6529 Occlusion and stenosis of unspecified carotid artery: Secondary | ICD-10-CM

## 2013-12-29 DIAGNOSIS — I2510 Atherosclerotic heart disease of native coronary artery without angina pectoris: Secondary | ICD-10-CM

## 2013-12-29 MED ORDER — LISINOPRIL 10 MG PO TABS: 10.0000 mg | ORAL_TABLET | Freq: Every day | ORAL | Status: DC

## 2013-12-29 NOTE — Patient Instructions (Signed)
**Note De-Identified  Obfuscation** Your physician has recommended you make the following change in your medication: decrease Lisinopril to 10 mg daily  Your physician wants you to follow-up in: 6 months. You will receive a reminder letter in the mail two months in advance. If you don't receive a letter, please call our office to schedule the follow-up appointment.

## 2013-12-29 NOTE — Assessment & Plan Note (Signed)
Coronary disease is stable.  No further workup. 

## 2013-12-29 NOTE — Assessment & Plan Note (Signed)
He had surgery in June, 2014. He's followed up by vascular surgery this past June.

## 2013-12-29 NOTE — Assessment & Plan Note (Signed)
Over time hypertension is been treated. When I saw him last his pressure was on the low side. Amlodipine was held. Pressure today is 009 systolic. My overall feeling is that this may still be a little lower than we want for him. I'm cutting his lisinopril dose down from 20 mg daily to 10 mg daily.

## 2013-12-29 NOTE — Progress Notes (Signed)
Patient ID: [name] [name], male   DOB: [birth year], 80 y.o.   MRN: [id number]    HPI  Patient is seen today to follow-up coronary disease and hypertension. I saw him last April, 2015. His blood pressure was low. Amlodipine was stopped. He was kept on his other medicines. He has rare dizziness. He has some exertional wheezing. He does not have PND or orthopnea.  No Known Allergies  Current Outpatient Prescriptions  Medication Sig Dispense Refill  . aspirin 81 MG tablet Take 81 mg by mouth daily.      Marland Kitchen atorvastatin (LIPITOR) 40 MG tablet Take 40 mg by mouth daily.       . cholecalciferol (VITAMIN D) 1000 UNITS tablet Take 1,000 Units by mouth daily.       . clopidogrel (PLAVIX) 75 MG tablet Take 1 tablet by mouth daily.      Marland Kitchen co-enzyme Q-10 30 MG capsule Take 200mg  daily      . escitalopram (LEXAPRO) 5 MG tablet Take 1 tab daily      . furosemide (LASIX) 20 MG tablet Take 10 mg by mouth daily.      Marland Kitchen gabapentin (NEURONTIN) 300 MG capsule Take 1 capsule (300 mg total) by mouth daily.  30 capsule  7  . lisinopril (PRINIVIL,ZESTRIL) 20 MG tablet TAKE 1 TABLET ONCE DAILY.  30 tablet  0  . oxybutynin (DITROPAN-XL) 5 MG 24 hr tablet Take 5 mg by mouth daily.       . saxagliptin HCl (ONGLYZA) 5 MG TABS tablet Take 2.5 mg by mouth daily.       . traMADol (ULTRAM) 50 MG tablet Take 1 tablet by mouth every 6 (six) hours as needed.      . vitamin B-12 (CYANOCOBALAMIN) 100 MCG tablet Take 100 mcg by mouth daily.       Marland Kitchen zolpidem (AMBIEN) 10 MG tablet Take 5 mg by mouth at bedtime as needed.       No current facility-administered medications for this visit.    History   Social History  . Marital Status: Married    Spouse Name: N/A    Number of Children: N/A  . Years of Education: N/A   Occupational History  . retired    Social History Main Topics  . Smoking status: Former Smoker -- 0.50 packs/day for 50 years    Types: Cigarettes    Quit date: 08/05/2012  . Smokeless tobacco: Never Used   . Alcohol Use: Yes     Comment: occasionally  . Drug Use: No  . Sexual Activity: Not on file   Other Topics Concern  . Not on file   Social History Narrative  . No narrative on file    Family History  Problem Relation Age of Onset  . Tuberculosis Mother   . Coronary artery disease Father   . Varicose Veins Father     Past Medical History  Diagnosis Date  . HTN (hypertension)   . Dyslipidemia   . CVA (cerebral vascular accident)   . AAA (abdominal aortic aneurysm)     Surgical repair June, 2014  . Renal artery stenosis   . Tobacco abuse   . CAD (coronary artery disease)   . Hx of CABG     2005  . Ejection fraction   . Neuropathy     Right foot  . Leg pain   . Abdominal pain   . Hyperlipidemia   . Nervousness(799.21)   . Peripheral vascular disease   .  Carotid artery occlusion   . COPD (chronic obstructive pulmonary disease) 08/15/2012  . Status post abdominal aortic aneurysm repair     June, 2014  . Fall at home Feb.4, 2015    Fx Right Hip  . Diabetes mellitus without complication     Past Surgical History  Procedure Laterality Date  . Coronary artery bypass graft  2005    x4  . Spine surgery  1977    Lumbar HNP surgery  . Aorta - bilateral femoral artery bypass graft N/A 08/09/2012    Procedure: AORTA BIFEMORAL BYPASS GRAFT;  Surgeon: Rosetta Posner, MD;  Location: [facility];  Service: Vascular;  Laterality: N/A;  . Hip arthroplasty Right 04/09/2013    Procedure: ARTHROPLASTY BIPOLAR HIP;  Surgeon: Gearlean Alf, MD;  Location: [facility];  Service: Orthopedics;  Laterality: Right;  . Joint replacement Right Feb. 4, 2015    Hip- Pt fell    Patient Active Problem List   Diagnosis Date Noted  . Hip fracture, right  S/P hip arthroplasty 04/14/2013  . Insomnia 04/14/2013  . Postoperative anemia due to acute blood loss 04/10/2013  . Renal failure, acute on chronic 04/09/2013  . Acute urinary retention 04/09/2013  . Closed displaced fracture of right femoral neck  04/08/2013  . DM2 (diabetes mellitus, type 2) 04/08/2013  . Aftercare following surgery of the circulatory system, [place] 09/03/2012  . PAD (peripheral artery disease)--bifemoral bypass graft. 08/20/2012  . Fluid overload 08/20/2012  . Heart block AV second degree - episodic 08/16/2012  . COPD (chronic obstructive pulmonary disease) 08/15/2012  . Dyspnea 08/15/2012  . CAD (coronary artery disease)   . Neuropathy   . HTN (hypertension)   . Dyslipidemia   . CVA (cerebral vascular accident)   . AAA (abdominal aortic aneurysm)   . Renal artery stenosis   . Tobacco abuse   . Hx of CABG   . Ejection fraction     ROS   Patient denies fever, chills, headache, sweats, rash, change in vision, change in hearing, chest pain, cough, nausea or vomiting, urinary symptoms. He's had some arthritic symptoms but feeling better. All other systems are reviewed and are negative.  PHYSICAL EXAM  Patient is stable today. To help with ambulation he did come in with a wheelchair. He's with his wife. He is oriented to person time and place. Affect is normal. Head is atraumatic. Sclerae and conjunctiva are normal. Lungs are clear. Respiratory effort is not labored. There is no jugulovenous distention. Cardiac exam reveals an S1 and S2. The abdomen is soft. There is no peripheral edema. There are no skin rashes.  Filed Vitals:   12/29/13 1152  BP: 110/60  Pulse: 59  Height: 5\' 10"  (1.778 m)  Weight: 205 lb (92.987 kg)     ASSESSMENT & PLAN

## 2014-02-12 ENCOUNTER — Encounter (HOSPITAL_COMMUNITY): Payer: Self-pay | Admitting: Vascular Surgery

## 2014-02-18 ENCOUNTER — Encounter: Payer: Self-pay | Admitting: Cardiology

## 2014-02-18 DIAGNOSIS — E139 Other specified diabetes mellitus without complications: Secondary | ICD-10-CM | POA: Diagnosis not present

## 2014-02-18 DIAGNOSIS — I10 Essential (primary) hypertension: Secondary | ICD-10-CM | POA: Diagnosis not present

## 2014-02-24 ENCOUNTER — Encounter (HOSPITAL_COMMUNITY): Payer: Medicare Other

## 2014-02-24 ENCOUNTER — Other Ambulatory Visit (HOSPITAL_COMMUNITY): Payer: Medicare Other

## 2014-02-24 ENCOUNTER — Ambulatory Visit: Payer: Medicare Other | Admitting: Family

## 2014-02-24 DIAGNOSIS — I10 Essential (primary) hypertension: Secondary | ICD-10-CM | POA: Diagnosis not present

## 2014-02-24 DIAGNOSIS — E118 Type 2 diabetes mellitus with unspecified complications: Secondary | ICD-10-CM | POA: Diagnosis not present

## 2014-02-24 DIAGNOSIS — E780 Pure hypercholesterolemia: Secondary | ICD-10-CM | POA: Diagnosis not present

## 2014-03-10 DIAGNOSIS — M7989 Other specified soft tissue disorders: Secondary | ICD-10-CM | POA: Diagnosis not present

## 2014-03-10 DIAGNOSIS — M2550 Pain in unspecified joint: Secondary | ICD-10-CM | POA: Diagnosis not present

## 2014-04-09 DIAGNOSIS — M1990 Unspecified osteoarthritis, unspecified site: Secondary | ICD-10-CM | POA: Diagnosis not present

## 2014-04-09 DIAGNOSIS — M2550 Pain in unspecified joint: Secondary | ICD-10-CM | POA: Diagnosis not present

## 2014-04-09 DIAGNOSIS — M7989 Other specified soft tissue disorders: Secondary | ICD-10-CM | POA: Diagnosis not present

## 2014-06-04 DIAGNOSIS — L602 Onychogryphosis: Secondary | ICD-10-CM | POA: Diagnosis not present

## 2014-06-04 DIAGNOSIS — E1151 Type 2 diabetes mellitus with diabetic peripheral angiopathy without gangrene: Secondary | ICD-10-CM | POA: Diagnosis not present

## 2014-06-04 DIAGNOSIS — L84 Corns and callosities: Secondary | ICD-10-CM | POA: Diagnosis not present

## 2014-06-29 ENCOUNTER — Ambulatory Visit: Payer: Medicare Other | Admitting: Cardiology

## 2014-06-29 DIAGNOSIS — M2550 Pain in unspecified joint: Secondary | ICD-10-CM | POA: Diagnosis not present

## 2014-06-29 DIAGNOSIS — J449 Chronic obstructive pulmonary disease, unspecified: Secondary | ICD-10-CM | POA: Diagnosis not present

## 2014-06-29 DIAGNOSIS — R0600 Dyspnea, unspecified: Secondary | ICD-10-CM | POA: Diagnosis not present

## 2014-06-29 DIAGNOSIS — M1990 Unspecified osteoarthritis, unspecified site: Secondary | ICD-10-CM | POA: Diagnosis not present

## 2014-06-29 DIAGNOSIS — M7989 Other specified soft tissue disorders: Secondary | ICD-10-CM | POA: Diagnosis not present

## 2014-07-02 DIAGNOSIS — N183 Chronic kidney disease, stage 3 (moderate): Secondary | ICD-10-CM | POA: Diagnosis not present

## 2014-07-02 DIAGNOSIS — I10 Essential (primary) hypertension: Secondary | ICD-10-CM | POA: Diagnosis not present

## 2014-07-02 DIAGNOSIS — E119 Type 2 diabetes mellitus without complications: Secondary | ICD-10-CM | POA: Diagnosis not present

## 2014-07-02 DIAGNOSIS — E780 Pure hypercholesterolemia: Secondary | ICD-10-CM | POA: Diagnosis not present

## 2014-07-29 DIAGNOSIS — I10 Essential (primary) hypertension: Secondary | ICD-10-CM | POA: Diagnosis not present

## 2014-07-29 DIAGNOSIS — E119 Type 2 diabetes mellitus without complications: Secondary | ICD-10-CM | POA: Diagnosis not present

## 2014-08-04 DIAGNOSIS — E780 Pure hypercholesterolemia: Secondary | ICD-10-CM | POA: Diagnosis not present

## 2014-08-04 DIAGNOSIS — I10 Essential (primary) hypertension: Secondary | ICD-10-CM | POA: Diagnosis not present

## 2014-08-04 DIAGNOSIS — E119 Type 2 diabetes mellitus without complications: Secondary | ICD-10-CM | POA: Diagnosis not present

## 2014-08-21 ENCOUNTER — Other Ambulatory Visit: Payer: Self-pay | Admitting: Cardiology

## 2014-09-17 DIAGNOSIS — E119 Type 2 diabetes mellitus without complications: Secondary | ICD-10-CM | POA: Diagnosis not present

## 2014-09-17 DIAGNOSIS — R2681 Unsteadiness on feet: Secondary | ICD-10-CM | POA: Diagnosis not present

## 2014-09-17 DIAGNOSIS — R0600 Dyspnea, unspecified: Secondary | ICD-10-CM | POA: Diagnosis not present

## 2014-09-17 DIAGNOSIS — J449 Chronic obstructive pulmonary disease, unspecified: Secondary | ICD-10-CM | POA: Diagnosis not present

## 2014-10-14 DIAGNOSIS — J449 Chronic obstructive pulmonary disease, unspecified: Secondary | ICD-10-CM | POA: Diagnosis not present

## 2014-10-14 DIAGNOSIS — F17211 Nicotine dependence, cigarettes, in remission: Secondary | ICD-10-CM | POA: Diagnosis not present

## 2014-10-14 DIAGNOSIS — J441 Chronic obstructive pulmonary disease with (acute) exacerbation: Secondary | ICD-10-CM | POA: Diagnosis not present

## 2014-10-15 ENCOUNTER — Other Ambulatory Visit: Payer: Self-pay | Admitting: Vascular Surgery

## 2014-10-21 DIAGNOSIS — I10 Essential (primary) hypertension: Secondary | ICD-10-CM | POA: Diagnosis not present

## 2014-10-21 DIAGNOSIS — I2510 Atherosclerotic heart disease of native coronary artery without angina pectoris: Secondary | ICD-10-CM | POA: Diagnosis not present

## 2014-10-21 DIAGNOSIS — N183 Chronic kidney disease, stage 3 (moderate): Secondary | ICD-10-CM | POA: Diagnosis not present

## 2014-10-21 DIAGNOSIS — E1121 Type 2 diabetes mellitus with diabetic nephropathy: Secondary | ICD-10-CM | POA: Diagnosis not present

## 2014-10-27 DIAGNOSIS — I10 Essential (primary) hypertension: Secondary | ICD-10-CM | POA: Diagnosis not present

## 2014-10-27 DIAGNOSIS — E119 Type 2 diabetes mellitus without complications: Secondary | ICD-10-CM | POA: Diagnosis not present

## 2014-10-29 DIAGNOSIS — I10 Essential (primary) hypertension: Secondary | ICD-10-CM | POA: Diagnosis not present

## 2014-10-29 DIAGNOSIS — M2550 Pain in unspecified joint: Secondary | ICD-10-CM | POA: Diagnosis not present

## 2014-10-29 DIAGNOSIS — E119 Type 2 diabetes mellitus without complications: Secondary | ICD-10-CM | POA: Diagnosis not present

## 2014-10-29 DIAGNOSIS — E780 Pure hypercholesterolemia: Secondary | ICD-10-CM | POA: Diagnosis not present

## 2014-11-01 ENCOUNTER — Other Ambulatory Visit: Payer: Self-pay | Admitting: Cardiology

## 2014-11-02 ENCOUNTER — Ambulatory Visit (INDEPENDENT_AMBULATORY_CARE_PROVIDER_SITE_OTHER): Payer: Medicare Other | Admitting: Cardiology

## 2014-11-02 ENCOUNTER — Encounter: Payer: Self-pay | Admitting: Cardiology

## 2014-11-02 VITALS — BP 124/74 | HR 68 | Ht 70.0 in | Wt 222.0 lb

## 2014-11-02 DIAGNOSIS — I10 Essential (primary) hypertension: Secondary | ICD-10-CM | POA: Diagnosis not present

## 2014-11-02 DIAGNOSIS — N183 Chronic kidney disease, stage 3 unspecified: Secondary | ICD-10-CM | POA: Insufficient documentation

## 2014-11-02 DIAGNOSIS — I2510 Atherosclerotic heart disease of native coronary artery without angina pectoris: Secondary | ICD-10-CM

## 2014-11-02 NOTE — Assessment & Plan Note (Signed)
Blood pressure stable today. There is some concern that lisinopril may not be optimal for him at this time. I've chosen to stop his lisinopril.

## 2014-11-02 NOTE — Assessment & Plan Note (Signed)
Coronary disease is stable.  No further workup. 

## 2014-11-02 NOTE — Assessment & Plan Note (Signed)
The patient is followed carefully by the renal team. There has been some question as to whether or not his lisinopril should be stopped based on his creatinine. I cannot explain the spells that he is having a feeling clammy. There is no proven hypotension or hypoglycemia. However I chosen to stop his lisinopril to see how he feels.Marland Kitchen

## 2014-11-02 NOTE — Patient Instructions (Signed)
Medication Instructions:  Stop taking Lisinopril  Labwork: None  Testing/Procedures: None  Follow-Up: Your physician wants you to follow-up in: 6 months with Dr Meda Coffee. You will receive a reminder letter in the mail two months in advance. If you don't receive a letter, please call our office to schedule the follow-up appointment.

## 2014-11-02 NOTE — Progress Notes (Signed)
Cardiology Office Note   Date:  11/02/2014   ID:  [name] [name], DOB [birth year], MRN [id number]  PCP:  Jani Gravel, MD  Cardiologist:  Dola Argyle, MD   Chief Complaint  Patient presents with  . Appointment    Follow-up coronary disease and hypertension      History of Present Illness: [name] [name] is a 81 y.o. male who presents to follow-up coronary artery disease and hypertension. He has significant renal dysfunction and has been followed carefully by the renal team. There is question is whether or not his lisinopril should be stopped paced on his creatinine. He's not having any chest pain. He does have periods where he feels poorly and feels clammy. His wife has noticed these episodes. He had one in the office today. He was not diaphoretic. Mentation was normal. Blood pressure and pulse were normal. Glucose was 106. He has not had syncope or presyncope with any of these episodes.    Past Medical History  Diagnosis Date  . HTN (hypertension)   . Dyslipidemia   . CVA (cerebral vascular accident)   . AAA (abdominal aortic aneurysm)     Surgical repair June, 2014  . Renal artery stenosis   . Tobacco abuse   . CAD (coronary artery disease)   . Hx of CABG     2005  . Ejection fraction   . Neuropathy     Right foot  . Leg pain   . Abdominal pain   . Hyperlipidemia   . Nervousness(799.21)   . Peripheral vascular disease   . Carotid artery occlusion   . COPD (chronic obstructive pulmonary disease) 08/15/2012  . Status post abdominal aortic aneurysm repair     June, 2014  . Fall at home Feb.4, 2015    Fx Right Hip  . Diabetes mellitus without complication     Past Surgical History  Procedure Laterality Date  . Coronary artery bypass graft  2005    x4  . Spine surgery  1977    Lumbar HNP surgery  . Aorta - bilateral femoral artery bypass graft N/A 08/09/2012    Procedure: AORTA BIFEMORAL BYPASS GRAFT;  Surgeon: Rosetta Posner, MD;  Location: [place];  Service:  Vascular;  Laterality: N/A;  . Hip arthroplasty Right 04/09/2013    Procedure: ARTHROPLASTY BIPOLAR HIP;  Surgeon: Gearlean Alf, MD;  Location: [facility];  Service: Orthopedics;  Laterality: Right;  . Joint replacement Right Feb. 4, 2015    Hip- Pt fell  . Abdominal aortagram N/A 08/07/2012    Procedure: ABDOMINAL AORTAGRAM;  Surgeon: Rosetta Posner, MD;  Location: [facility] CATH LAB;  Service: Cardiovascular;  Laterality: N/A;    Patient Active Problem List   Diagnosis Date Noted  . Hip fracture, right  S/P hip arthroplasty 04/14/2013  . Insomnia 04/14/2013  . Postoperative anemia due to acute blood loss 04/10/2013  . Renal failure, acute on chronic 04/09/2013  . Acute urinary retention 04/09/2013  . Closed displaced fracture of right femoral neck 04/08/2013  . DM2 (diabetes mellitus, type 2) 04/08/2013  . Aftercare following surgery of the circulatory system, [place] 09/03/2012  . PAD (peripheral artery disease)--bifemoral bypass graft. 08/20/2012  . Fluid overload 08/20/2012  . Heart block AV second degree - episodic 08/16/2012  . COPD (chronic obstructive pulmonary disease) 08/15/2012  . Dyspnea 08/15/2012  . CAD (coronary artery disease)   . Neuropathy   . HTN (hypertension)   . Dyslipidemia   . CVA (cerebral vascular  accident)   . AAA (abdominal aortic aneurysm)   . Renal artery stenosis   . Tobacco abuse   . Hx of CABG   . Ejection fraction       Current Outpatient Prescriptions  Medication Sig Dispense Refill  . ALBUTEROL IN Inhale 1 puff into the lungs every 4 (four) hours as needed (shortness of breath/ wheezing).    Marland Kitchen aspirin 81 MG tablet Take 81 mg by mouth daily.    Marland Kitchen atorvastatin (LIPITOR) 40 MG tablet Take 40 mg by mouth daily.     . cholecalciferol (VITAMIN D) 1000 UNITS tablet Take 1,000 Units by mouth daily.     . clopidogrel (PLAVIX) 75 MG tablet Take 1 tablet by mouth daily.    Marland Kitchen co-enzyme Q-10 30 MG capsule Take 200mg  daily    . escitalopram (LEXAPRO) 5 MG tablet Take  5 mg by mouth daily.     . furosemide (LASIX) 20 MG tablet Take 10 mg by mouth daily.    Marland Kitchen gabapentin (NEURONTIN) 300 MG capsule TAKE (1) CAPSULE DAILY. 60 capsule 3  . glimepiride (AMARYL) 1 MG tablet Take 1 mg by mouth daily with breakfast.    . oxybutynin (DITROPAN-XL) 5 MG 24 hr tablet Take 5 mg by mouth daily.     . pioglitazone (ACTOS) 15 MG tablet Take 15 mg by mouth daily.    . predniSONE (DELTASONE) 1 MG tablet Take two (2) tablets (2 mg total) by mouth daily for two (2) weeks; Then take one (1) tablet (1 mg total) by mouth daily for two (2) weeks; Then discontinue    . saxagliptin HCl (ONGLYZA) 5 MG TABS tablet Take 2.5 mg by mouth daily.     . traMADol (ULTRAM) 50 MG tablet Take 1 tablet by mouth every 6 (six) hours as needed for moderate pain.     . vitamin B-12 (CYANOCOBALAMIN) 100 MCG tablet Take 100 mcg by mouth daily.     Marland Kitchen zolpidem (AMBIEN) 10 MG tablet Take 5 mg by mouth at bedtime as needed for sleep.      No current facility-administered medications for this visit.    Allergies:   Review of patient's allergies indicates no known allergies.    Social History:  The patient  reports that he quit smoking about 2 years ago. His smoking use included Cigarettes. He has a 25 pack-year smoking history. He has never used smokeless tobacco. He reports that he drinks alcohol. He reports that he does not use illicit drugs.   Family History:  The patient's  family history includes Coronary artery disease in his father; Tuberculosis in his mother; Varicose Veins in his father.    ROS:  Please see the history of present illness.      Patient denies fever, chills, headache, sweats, rash, change in vision, change in hearing, chest pain, cough, nausea or vomiting, urinary symptoms. All other systems are reviewed and are negative other than the history of present illness.   PHYSICAL EXAM: VS:  BP 124/74 mmHg  Pulse 68  Ht 5\' 10"  (1.778 m)  Wt 222 lb (100.699 kg)  BMI 31.85 kg/m2  , Patient is in a wheelchair. He's feeling better now after period of time with no specific treatment. His wife is with him. Head is atraumatic. Sclera and conjunctiva are normal. There is no jugular venous distention. Lungs are clear. Respiratory effort is nonlabored. Cardiac exam was S1 and S2. Abdomen is soft. There is no peripheral edema. There are no skin  rashes.  EKG:   EKG is done today and reviewed by me. There is sinus rhythm. There are nonspecific ST-T wave changes. There is no significant change from the past.   Recent Labs: No results found for requested labs within last 365 days.    Lipid Panel No results found for: CHOL, TRIG, HDL, CHOLHDL, VLDL, LDLCALC, LDLDIRECT    Wt Readings from Last 3 Encounters:  11/02/14 222 lb (100.699 kg)  12/29/13 205 lb (92.987 kg)  08/15/13 200 lb (90.719 kg)      Current medicines are reviewed  The patient and his wife understand his medications.     ASSESSMENT AND PLAN:

## 2014-11-11 ENCOUNTER — Telehealth: Payer: Self-pay | Admitting: Cardiology

## 2014-11-11 NOTE — Telephone Encounter (Signed)
**Note De-Identified  Obfuscation** At the pts last OV with Dr Ron Parker on 8/29 the pt was advised to stop taking Lisinopril as there has been some question as to whether or not it should be stopped based on his creatinine. Since that appointment the pts wife, Stanton Kidney, states that the pts kidney MD advised him to resume Lisinopril 5 mg daily.  Stanton Kidney is concerned and reports BPs taken this am as follows: 11 am  BP= 174/112  HR= 75 All of the foliowing BPs were taken shortly after this one. BP= 169/107 HR= 72 BP= 158/97  HR= 68 BP= 146/91  HR= 76 BP= 176/108  HR= 78 The pt took his Lisinopril dose at about 8:30 this am. Stanton Kidney is advised to stop checking his BP so often as they may not be getting accurate readings.  She states that the pt continues to c/o weakness and feeling clammy. She wants to know what to do to lower the pts BP. Please advise.

## 2014-11-11 NOTE — Telephone Encounter (Signed)
**Note De-Identified  Obfuscation** The pt is advised and he verbalized understanding. He states that he will now only check his BP 2 hours after he has taken his Lisinopril dose in the AM and once in the evening.  Lisinopril dose updated in the pts chart.

## 2014-11-11 NOTE — Telephone Encounter (Signed)
New messge     Pt c/o BP issue:  1. What are your last 5 BP readings? 174/112 pulse 75, 169/107 pulse 72, 158/97 pulse 68, 158/85, 146/91 pulse 76 2. Are you having any other symptoms (ex. Dizziness, headache, blurred vision, passed out)? Weak and clamy 3. What is your medication issue? Wife thinks bp is too high

## 2014-11-11 NOTE — Telephone Encounter (Signed)
Have him go back to Lisinopril 10mg / day. Also, reassurance.  Also, better timing on when to check BP and decreased frequency.  (not multiple times in a row).

## 2014-11-16 ENCOUNTER — Other Ambulatory Visit: Payer: Self-pay | Admitting: Cardiology

## 2014-11-17 ENCOUNTER — Telehealth: Payer: Self-pay | Admitting: Cardiology

## 2014-11-17 NOTE — Telephone Encounter (Signed)
New message      Pt c/o BP issue: STAT if pt c/o blurred vision, one-sided weakness or slurred speech  1. What are your last 5 BP readings? 165/98 after rx pulse 83; 147/87 yesterday, 151/96 last night 2. Are you having any other symptoms (ex. Dizziness, headache, blurred vision, passed out)? no 3. What is your BP issue?  bp is still high.  Pt takes 10mg  of lisinopril daily.  Please advise

## 2014-11-17 NOTE — Telephone Encounter (Signed)
Calling stating his BP has been elevated the past couple of days.  Today at 10:00 BP was 165/98 (did not have HR); yesterday 9/12 at 11:30 am 147/87; 11 PM 151/96; on 9/11 149/77 HR 49; 9/10 at 12:00 136/90 HR 88; at 3:30 136/82 HR 78.  States he feels good except is apprehensive about BP being elevated. No dizziness or lightheadedness.  States he is taking Lisinopril 10 mg in AM.  Was restarted last Wed 9/7.  Reminded to take BP about 2 hrs after taking Lisinopril. Spoke w/[facility] [employer] who suggests he monitor his BP over next 2 days and will be seen in HTN clinic on 9/16 by Elberta Leatherwood, pharmacist. Nicki Reaper did not want to add another medication until more recordings were available. He also has some renal issues. Advised to bring his readings with him along with his BP machine. He verbalizes understanding of appointment on 9/16 at 11:30.

## 2014-11-20 ENCOUNTER — Ambulatory Visit (INDEPENDENT_AMBULATORY_CARE_PROVIDER_SITE_OTHER): Payer: Medicare Other | Admitting: Pharmacist

## 2014-11-20 DIAGNOSIS — I10 Essential (primary) hypertension: Secondary | ICD-10-CM

## 2014-11-20 DIAGNOSIS — N183 Chronic kidney disease, stage 3 (moderate): Secondary | ICD-10-CM | POA: Diagnosis not present

## 2014-11-20 DIAGNOSIS — I2510 Atherosclerotic heart disease of native coronary artery without angina pectoris: Secondary | ICD-10-CM

## 2014-11-20 NOTE — Progress Notes (Signed)
Cardiology Office Note   Date:  11/24/2014   ID:  [name] [name], DOB Dec 27, 1931, MRN [id number]  PCP:  Jani Gravel, MD  Cardiologist:  Dr. Dola Argyle  Chief Complaint  Patient presents with  . Hypertension      History of Present Illness: [name] [name] is a 81 y.o. male who presents for follow up of hypertension. He has significant renal dysfunction and has been followed carefully by the renal team.  He was seen by Dr. Ron Parker on 8/29.  His lisinopril 10mg  daily was stopped at that time due to concerns over SCr and patient having some "clammy feeling" episodes.  He saw Nephrology after this visit and they restarted lisinopril at 5mg  daily.  He called in on 9/7 to report BP in the 297L-892J systolic.  He was asked to increase lisinopril back to 10mg  daily and decrease the number of times he was checking his BP at that time.  He called back on 9/13 to report his blood pressure was continuing to run in the 194R-740C systolic.  No medication changes were made but an appt to follow up today with Hypertension clinic.     Pt is accompanied with his wife.  He has his BP readings from home as well as his machine.  He usually takes his BP in the morning ~30 min to 1 hour after his lisinopril.  His home readings will vary from the 144Y-185U systolic but there is a noticeable trend of staying when he checks it multiple times a day.  ? Related to anxiety of getting a high reading.    BP in office today:  R arm Manual:  136/84 mmHg R arm automatic cuff: 119/82 mmHg  L arm manual: 130/84 L arm automatic cuff:  131/88 HR 91     Current HTN Medications:  Lisinopril 10mg  daily  Past Medical History  Diagnosis Date  . HTN (hypertension)   . Dyslipidemia   . CVA (cerebral vascular accident)   . AAA (abdominal aortic aneurysm)     Surgical repair June, 2014  . Renal artery stenosis   . Tobacco abuse   . CAD (coronary artery disease)   . Hx of CABG     2005  . Ejection fraction   .  Neuropathy     Right foot  . Leg pain   . Abdominal pain   . Hyperlipidemia   . Nervousness(799.21)   . Peripheral vascular disease   . Carotid artery occlusion   . COPD (chronic obstructive pulmonary disease) 08/15/2012  . Status post abdominal aortic aneurysm repair     June, 2014  . Fall at home Feb.4, 2015    Fx Right Hip  . Diabetes mellitus without complication     Past Surgical History  Procedure Laterality Date  . Coronary artery bypass graft  2005    x4  . Spine surgery  1977    Lumbar HNP surgery  . Aorta - bilateral femoral artery bypass graft N/A 08/09/2012    Procedure: AORTA BIFEMORAL BYPASS GRAFT;  Surgeon: Rosetta Posner, MD;  Location: [place];  Service: Vascular;  Laterality: N/A;  . Hip arthroplasty Right 04/09/2013    Procedure: ARTHROPLASTY BIPOLAR HIP;  Surgeon: Gearlean Alf, MD;  Location: [facility];  Service: Orthopedics;  Laterality: Right;  . Joint replacement Right Feb. 4, 2015    Hip- Pt fell  . Abdominal aortagram N/A 08/07/2012    Procedure: ABDOMINAL AORTAGRAM;  Surgeon: Rosetta Posner, MD;  Location: [facility] CATH LAB;  Service: Cardiovascular;  Laterality: N/A;    Patient Active Problem List   Diagnosis Date Noted  . CKD (chronic kidney disease) stage 3, GFR 30-59 ml/min 11/02/2014  . Hip fracture, right  S/P hip arthroplasty 04/14/2013  . Insomnia 04/14/2013  . Postoperative anemia due to acute blood loss 04/10/2013  . Renal failure, acute on chronic 04/09/2013  . Acute urinary retention 04/09/2013  . Closed displaced fracture of right femoral neck 04/08/2013  . DM2 (diabetes mellitus, type 2) 04/08/2013  . Aftercare following surgery of the circulatory system, [place] 09/03/2012  . PAD (peripheral artery disease)--bifemoral bypass graft. 08/20/2012  . Fluid overload 08/20/2012  . Heart block AV second degree - episodic 08/16/2012  . COPD (chronic obstructive pulmonary disease) 08/15/2012  . Dyspnea 08/15/2012  . CAD (coronary artery disease)   . Neuropathy    . Essential hypertension   . Dyslipidemia   . CVA (cerebral vascular accident)   . AAA (abdominal aortic aneurysm)   . Renal artery stenosis   . Tobacco abuse   . Hx of CABG   . Ejection fraction       Current Outpatient Prescriptions  Medication Sig Dispense Refill  . ALBUTEROL IN Inhale 1 puff into the lungs every 4 (four) hours as needed (shortness of breath/ wheezing).    Marland Kitchen aspirin 81 MG tablet Take 81 mg by mouth daily.    Marland Kitchen atorvastatin (LIPITOR) 40 MG tablet Take 40 mg by mouth daily.     . cholecalciferol (VITAMIN D) 1000 UNITS tablet Take 1,000 Units by mouth daily.     . clopidogrel (PLAVIX) 75 MG tablet Take 1 tablet by mouth daily.    Marland Kitchen co-enzyme Q-10 30 MG capsule Take 200mg  daily    . escitalopram (LEXAPRO) 5 MG tablet Take 5 mg by mouth daily.     . furosemide (LASIX) 20 MG tablet Take 10 mg by mouth daily.    Marland Kitchen gabapentin (NEURONTIN) 300 MG capsule TAKE (1) CAPSULE DAILY. 60 capsule 3  . glimepiride (AMARYL) 1 MG tablet Take 1 mg by mouth daily with breakfast.    . lisinopril (PRINIVIL,ZESTRIL) 10 MG tablet TAKE 1 TABLET ONCE DAILY. 30 tablet 6  . oxybutynin (DITROPAN-XL) 5 MG 24 hr tablet Take 5 mg by mouth daily.     . pioglitazone (ACTOS) 15 MG tablet Take 15 mg by mouth daily.    . predniSONE (DELTASONE) 1 MG tablet Take two (2) tablets (2 mg total) by mouth daily for two (2) weeks; Then take one (1) tablet (1 mg total) by mouth daily for two (2) weeks; Then discontinue    . saxagliptin HCl (ONGLYZA) 5 MG TABS tablet Take 2.5 mg by mouth daily.     . traMADol (ULTRAM) 50 MG tablet Take 1 tablet by mouth every 6 (six) hours as needed for moderate pain.     . vitamin B-12 (CYANOCOBALAMIN) 100 MCG tablet Take 100 mcg by mouth daily.     Marland Kitchen zolpidem (AMBIEN) 10 MG tablet Take 5 mg by mouth at bedtime as needed for sleep.      No current facility-administered medications for this visit.    Allergies:   Review of patient's allergies indicates no known allergies.     Social History:  The patient  reports that he quit smoking about 2 years ago. His smoking use included Cigarettes. He has a 25 pack-year smoking history. He has never used smokeless tobacco. He reports that he drinks alcohol. He  reports that he does not use illicit drugs.   Family History:  The patient's  family history includes Coronary artery disease in his father; Tuberculosis in his mother; Varicose Veins in his father.   ASSESSMENT AND PLAN:  1.  Hypertension: Pt's BP well controlled in office today.  Manual cuff was similar to automatic readings.  Do no think high readings at home are due to machine error.  He had taken his medication ~3 hours prior to readings in office today.  He only waits ~30 min at home in the morning before he checks it.  Suggested he wait at least 2 hours after his dose of lisinopril to check his BP to ensure it has had time to work.  He also has some anxiety when he gets a high reading.  I suspect this is why they remain high over the next few hours.  Discussed ways to calm himself prior to checking his BP and importance of not checking multiple times per day.  For now, will not change any of his medications.  He will continue to check his BP at home and call with readings that are consistently >150/90

## 2014-11-25 DIAGNOSIS — I10 Essential (primary) hypertension: Secondary | ICD-10-CM | POA: Diagnosis not present

## 2014-11-25 DIAGNOSIS — N189 Chronic kidney disease, unspecified: Secondary | ICD-10-CM | POA: Diagnosis not present

## 2014-12-30 DIAGNOSIS — L602 Onychogryphosis: Secondary | ICD-10-CM | POA: Diagnosis not present

## 2014-12-30 DIAGNOSIS — E1151 Type 2 diabetes mellitus with diabetic peripheral angiopathy without gangrene: Secondary | ICD-10-CM | POA: Diagnosis not present

## 2014-12-30 DIAGNOSIS — L84 Corns and callosities: Secondary | ICD-10-CM | POA: Diagnosis not present

## 2015-01-08 DIAGNOSIS — J40 Bronchitis, not specified as acute or chronic: Secondary | ICD-10-CM | POA: Diagnosis not present

## 2015-01-08 DIAGNOSIS — I10 Essential (primary) hypertension: Secondary | ICD-10-CM | POA: Diagnosis not present

## 2015-01-18 ENCOUNTER — Encounter: Payer: Self-pay | Admitting: Nurse Practitioner

## 2015-01-18 ENCOUNTER — Telehealth: Payer: Self-pay | Admitting: Cardiology

## 2015-01-18 NOTE — Telephone Encounter (Signed)
I called and left message on Dr Thyra Breed assist, [phone], requesting copy of October lab.

## 2015-01-18 NOTE — Telephone Encounter (Signed)
New Message  Pt is FORMER [facility]- to follow up w/ Dr Meda Coffee in Feb, pt wife calling to speak w/ RN concerning recent BP issue. Please call back and discuss.   Pt c/o BP issue: STAT if pt c/o blurred vision, one-sided weakness or slurred speech  1. What are your last 5 BP readings? 01/17/15--  152/71 p 39 01/18/15 -- 174/93 p 52  2. Are you having any other symptoms (ex. Dizziness, headache, blurred vision, passed out)? Weak   3. What is your BP issue? Too high, pt feeling weak

## 2015-01-18 NOTE — Telephone Encounter (Signed)
Pt states he has had more than usual weakness over the past month.

## 2015-01-18 NOTE — Telephone Encounter (Signed)
Pt's wife states BP 155/80 pulse 62 today.

## 2015-01-18 NOTE — Telephone Encounter (Signed)
Mary from [facility] will fax renal profiles done in September 2016, pt did not have lab there in October.

## 2015-01-18 NOTE — Telephone Encounter (Signed)
Pt's wife concerned because heart rate readings have been variable over the last month.  Pulse readings:  12/26/14 44 12/27/14 83 12/31/14 47 01/04/15 43 01/06/15 65 01/07/15 43 01/15/15 94

## 2015-01-18 NOTE — Telephone Encounter (Signed)
Pt states he  had lab done at [facility] in October 2016.

## 2015-01-18 NOTE — Telephone Encounter (Signed)
Reviewed with Truitt Merle, NP. Per Lori--schedule pt to see her tomorrow at 3 PM, bring all medications, recent BP and heart rate readings, get copy of recent lab.

## 2015-01-18 NOTE — Telephone Encounter (Signed)
Recent BP readings: 01/17/15 174/93 52 01/16/15 152/71 39--PM 01/16/15 143/85 94-AM 01/07/15 169/88 48

## 2015-01-19 ENCOUNTER — Ambulatory Visit (INDEPENDENT_AMBULATORY_CARE_PROVIDER_SITE_OTHER): Payer: Medicare Other | Admitting: Nurse Practitioner

## 2015-01-19 ENCOUNTER — Encounter: Payer: Self-pay | Admitting: Nurse Practitioner

## 2015-01-19 VITALS — BP 148/82 | HR 64 | Ht 70.0 in | Wt 229.8 lb

## 2015-01-19 DIAGNOSIS — R5383 Other fatigue: Secondary | ICD-10-CM

## 2015-01-19 DIAGNOSIS — I259 Chronic ischemic heart disease, unspecified: Secondary | ICD-10-CM

## 2015-01-19 DIAGNOSIS — R0600 Dyspnea, unspecified: Secondary | ICD-10-CM | POA: Diagnosis not present

## 2015-01-19 DIAGNOSIS — I441 Atrioventricular block, second degree: Secondary | ICD-10-CM

## 2015-01-19 DIAGNOSIS — I701 Atherosclerosis of renal artery: Secondary | ICD-10-CM

## 2015-01-19 LAB — CBC
HCT: 39.3 % (ref 39.0–52.0)
Hemoglobin: 12.7 g/dL — ABNORMAL LOW (ref 13.0–17.0)
MCH: 29.8 pg (ref 26.0–34.0)
MCHC: 32.3 g/dL (ref 30.0–36.0)
MCV: 92.3 fL (ref 78.0–100.0)
MPV: 11 fL (ref 8.6–12.4)
Platelets: 239 10*3/uL (ref 150–400)
RBC: 4.26 MIL/uL (ref 4.22–5.81)
RDW: 14.6 % (ref 11.5–15.5)
WBC: 11.5 10*3/uL — ABNORMAL HIGH (ref 4.0–10.5)

## 2015-01-19 LAB — BASIC METABOLIC PANEL
BUN: 22 mg/dL (ref 7–25)
CO2: 25 mmol/L (ref 20–31)
Calcium: 8.9 mg/dL (ref 8.6–10.3)
Chloride: 104 mmol/L (ref 98–110)
Creat: 1.77 mg/dL — ABNORMAL HIGH (ref 0.70–1.11)
Glucose, Bld: 96 mg/dL (ref 65–99)
Potassium: 4.2 mmol/L (ref 3.5–5.3)
Sodium: 140 mmol/L (ref 135–146)

## 2015-01-19 LAB — TSH: TSH: 1.989 u[IU]/mL (ref 0.350–4.500)

## 2015-01-19 NOTE — Patient Instructions (Addendum)
We will be checking the following labs today - BMET, CBC, TSH   Medication Instructions:    Continue with your current medicines.     Testing/Procedures To Be Arranged:  Holter  Renal duplex  Lexiscan Myoview  Echocardiogram  Follow-Up:   See Dr. Meda Coffee in follow up    Other Special Instructions:   N/A    If you need a refill on your cardiac medications before your next appointment, please call your pharmacy.   Call the [place] office at [phone] if you have any questions, problems or concerns.

## 2015-01-19 NOTE — Progress Notes (Signed)
CARDIOLOGY OFFICE NOTE  Date:  01/19/2015    [name] Date of Birth: 1931/07/20 Medical Record [id number]  PCP:  Jani Gravel, MD  Cardiologist:  Former patient of Dr. Kae Heller    Chief Complaint  Patient presents with  . Bradycardia    Work in visit - seen for Dr. Ron Parker    History of Present Illness: [name] [name] is a 81 y.o. male who presents today for a work in visit. Former patient of Dr. Kae Heller. Will be establishing with Dr. Meda Coffee. He has CKD, HTN, AAA repair, prior CVA, PVD and CAD with remote CABG by Dr. Roxan Hockey. Other issues as noted below.   Last seen in August. Has been seen in HTN clinic here as well. Seems to have anxiety due to anticipated "high" readings. He has been off and on of his ACE. He is followed at [facility].   Phone yesterday due to complaints of fatigue and noted lower HR.   Comes in today. Here with his wife. Lots of issues. He has had a gradual progression of weakness. He is short of breath with activity. Not doing very much. No chest pain. BP "all over the place". HR now noted to be in the 40's at times. No frank syncope noted. Remains on Plavix. Easy bruising. Not sure if he is taking (or on) Amaryl. Not checking blood sugars. Not on any rate slowing medicines. Seems pretty sedentary. Not really dizzy and no palpitations. BP diary with HR reviewed from home.   Past Medical History  Diagnosis Date  . HTN (hypertension)   . Dyslipidemia   . CVA (cerebral vascular accident) ([place])   . AAA (abdominal aortic aneurysm) [facility])     Surgical repair June, 2014  . Renal artery stenosis ([place])   . Tobacco abuse   . CAD (coronary artery disease)   . Hx of CABG     2005  . Ejection fraction   . Neuropathy ([place])     Right foot  . Leg pain   . Abdominal pain   . Hyperlipidemia   . Nervousness(799.21)   . Peripheral vascular disease ([place])   . Carotid artery occlusion   . COPD (chronic obstructive pulmonary disease) ([place]) 08/15/2012  .  Status post abdominal aortic aneurysm repair     June, 2014  . Fall at home Feb.4, 2015    Fx Right Hip  . Diabetes mellitus without complication [facility])     Past Surgical History  Procedure Laterality Date  . Coronary artery bypass graft  2005    x4  . Spine surgery  1977    Lumbar HNP surgery  . Aorta - bilateral femoral artery bypass graft N/A 08/09/2012    Procedure: AORTA BIFEMORAL BYPASS GRAFT;  Surgeon: Rosetta Posner, MD;  Location: [place];  Service: Vascular;  Laterality: N/A;  . Hip arthroplasty Right 04/09/2013    Procedure: ARTHROPLASTY BIPOLAR HIP;  Surgeon: Gearlean Alf, MD;  Location: [facility];  Service: Orthopedics;  Laterality: Right;  . Joint replacement Right Feb. 4, 2015    Hip- Pt fell  . Abdominal aortagram N/A 08/07/2012    Procedure: ABDOMINAL AORTAGRAM;  Surgeon: Rosetta Posner, MD;  Location: [facility] CATH LAB;  Service: Cardiovascular;  Laterality: N/A;     Medications: Current Outpatient Prescriptions  Medication Sig Dispense Refill  . aspirin 81 MG tablet Take 81 mg by mouth daily.    Marland Kitchen atorvastatin (LIPITOR) 40 MG tablet Take 40 mg by  mouth daily.     . cholecalciferol (VITAMIN D) 1000 UNITS tablet Take 1,000 Units by mouth daily.     . clopidogrel (PLAVIX) 75 MG tablet Take 1 tablet by mouth daily.    Marland Kitchen escitalopram (LEXAPRO) 5 MG tablet Take 5 mg by mouth daily.     . furosemide (LASIX) 20 MG tablet Take 10 mg by mouth daily.    Marland Kitchen gabapentin (NEURONTIN) 300 MG capsule TAKE (1) CAPSULE DAILY. 60 capsule 3  . lisinopril (PRINIVIL,ZESTRIL) 10 MG tablet TAKE 1 TABLET ONCE DAILY. 30 tablet 6  . oxybutynin (DITROPAN-XL) 5 MG 24 hr tablet Take 5 mg by mouth daily.     . pioglitazone (ACTOS) 15 MG tablet Take 15 mg by mouth daily.    Marland Kitchen PROAIR HFA 108 (90 BASE) MCG/ACT inhaler Inhale 1 puff into the lungs every 4 (four) hours as needed.     . saxagliptin HCl (ONGLYZA) 5 MG TABS tablet Take 2.5 mg by mouth daily.     . traMADol (ULTRAM) 50 MG tablet Take 1 tablet by mouth  every 6 (six) hours as needed for moderate pain.     . vitamin B-12 (CYANOCOBALAMIN) 100 MCG tablet Take 100 mcg by mouth daily.     Marland Kitchen zolpidem (AMBIEN) 10 MG tablet Take 5 mg by mouth at bedtime as needed for sleep.     Marland Kitchen co-enzyme Q-10 30 MG capsule Take 200mg  daily    . glimepiride (AMARYL) 1 MG tablet Take 1 mg by mouth daily with breakfast.     No current facility-administered medications for this visit.    Allergies: No Known Allergies  Social History: The patient  reports that he quit smoking about 2 years ago. His smoking use included Cigarettes. He has a 25 pack-year smoking history. He has never used smokeless tobacco. He reports that he drinks alcohol. He reports that he does not use illicit drugs.   Family History: The patient's family history includes Coronary artery disease in his father; Tuberculosis in his mother; Varicose Veins in his father.   Review of Systems: Please see the history of present illness.   Otherwise, the review of systems is positive for none.   All other systems are reviewed and negative.   Physical Exam: VS:  BP 148/82 mmHg  Pulse 64  Ht 5\' 10"  (1.778 m)  Wt 229 lb 12.8 oz (104.237 kg)  BMI 32.97 kg/m2  SpO2 91% .  BMI Body mass index is 32.97 kg/(m^2).  Wt Readings from Last 3 Encounters:  01/19/15 229 lb 12.8 oz (104.237 kg)  11/02/14 222 lb (100.699 kg)  12/29/13 205 lb (92.987 kg)    General: Pleasant. Chronically ill appearing but in no acute distress.  HEENT: Normal. Neck: Supple, no JVD, carotid bruits, or masses noted.  Cardiac: Irregular rhythm. Rate a little slow. Trace edema.  Respiratory:  Lungs are fairly clear to auscultation bilaterally with normal work of breathing.  GI: Soft and nontender.  MS: No deformity or atrophy. Gait not tested - he is in a wheelchair. Skin: Warm and dry. Color is normal.  Neuro:  Strength and sensation are intact and no gross focal deficits noted.  Psych: Alert, appropriate and with normal  affect.   LABORATORY DATA:  EKG:  EKG is ordered today. This demonstrates 2nd degree AV block - type I - Wenkebach - his rate is 53.  Lab Results  Component Value Date   WBC 8.9 04/12/2013   HGB 10.1* 04/12/2013   HCT  31.7* 04/12/2013   PLT 154 04/12/2013   GLUCOSE 113* 04/11/2013   ALT 21 08/19/2012   AST 31 08/19/2012   NA 137 04/11/2013   K 4.4 04/11/2013   CL 105 04/11/2013   CREATININE 1.80* 04/11/2013   BUN 30* 04/11/2013   CO2 22 04/11/2013   INR 0.98 04/08/2013   HGBA1C 6.1* 08/10/2012    BNP (last 3 results) No results for input(s): BNP in the last 8760 hours.  ProBNP (last 3 results) No results for input(s): PROBNP in the last 8760 hours.   Other Studies Reviewed Today:  Myoview Impression from 08/2011 Exercise Capacity: Lexiscan with no exercise. BP Response: Normal blood pressure response. Clinical Symptoms: No chest pain or dyspnea. ECG Impression: No significant ST segment change suggestive of ischemia. Comparison with Prior Nuclear Study: No previous nuclear study performed.  Overall Impression: Abnormal stress nuclear study with a mild, small, fixed apical defect and a medium, moderate size , fixed inferobasal defect consistent with apical thinning and a prior inferobasal infarct; no ischemia.  LV Ejection Fraction: 60%. LV Wall Motion: NL LV Function; NL Wall Motion  Assessment/Plan: 1. Fatigue - most likely multifactorial. Recheck his labs today. Will arrange for follow up testing.   2. New AV block - Mobitz type 1  3. CAD - remote CABG - tells me that he has never really had chest pain syndrome. No recent ischemic workup.   4. HTN - labile readings - has RAS listed in his PMH - no real details noted.   5. DM  6. CKD - followed by nephrology  7. DOE  Reviewed with Dr. Tamala Julian here today (DOD). Will arrange for 24 hour Holter, echo, Lexiscan and renal duplex. For now, we will keep on his current medicines. Not on rate slowing  medicines. May need PPM implant. Further disposition to follow.     Current medicines are reviewed with the patient today.  The patient does not have concerns regarding medicines other than what has been noted above.  The following changes have been made:  See above.  Labs/ tests ordered today include:    Orders Placed This Encounter  Procedures  . Basic metabolic panel  . CBC  . TSH  . Holter monitor - 24 hour  . EKG 12-Lead  . ECHOCARDIOGRAM COMPLETE     Disposition:   Further disposition to follow.   Patient is agreeable to this plan and will call if any problems develop in the interim.   Signed: Burtis Junes, RN, ANP-C 01/19/2015 4:06 PM  [facility] [address] [place], [place]  [address] Phone: [phone] Fax: [phone]

## 2015-01-20 ENCOUNTER — Telehealth: Payer: Self-pay | Admitting: *Deleted

## 2015-01-20 NOTE — Telephone Encounter (Signed)
Mailing back to pt bp readings and paperwork that was left behind in our office.

## 2015-01-22 NOTE — Telephone Encounter (Signed)
Lab done 11/16

## 2015-01-25 ENCOUNTER — Telehealth (HOSPITAL_COMMUNITY): Payer: Self-pay | Admitting: *Deleted

## 2015-01-25 NOTE — Telephone Encounter (Signed)
Patient given detailed instructions per Myocardial Perfusion Study Information Sheet for the test on 02/01/15 at 0745. Patient notified to arrive 15 minutes early and that it is imperative to arrive on time for appointment to keep from having the test rescheduled.  If you need to cancel or reschedule your appointment, please call the office within 24 hours of your appointment. Failure to do so may result in a cancellation of your appointment, and a $50 no show fee. Patient verbalized understanding.Treyana Sturgell, Ranae Palms

## 2015-01-26 ENCOUNTER — Other Ambulatory Visit: Payer: Self-pay | Admitting: Nurse Practitioner

## 2015-01-26 DIAGNOSIS — R5383 Other fatigue: Secondary | ICD-10-CM

## 2015-01-26 DIAGNOSIS — R0600 Dyspnea, unspecified: Secondary | ICD-10-CM

## 2015-01-26 DIAGNOSIS — I259 Chronic ischemic heart disease, unspecified: Secondary | ICD-10-CM

## 2015-01-26 DIAGNOSIS — I441 Atrioventricular block, second degree: Secondary | ICD-10-CM

## 2015-01-26 DIAGNOSIS — I701 Atherosclerosis of renal artery: Secondary | ICD-10-CM

## 2015-01-26 DIAGNOSIS — I10 Essential (primary) hypertension: Secondary | ICD-10-CM

## 2015-01-27 ENCOUNTER — Other Ambulatory Visit: Payer: Self-pay

## 2015-01-27 ENCOUNTER — Ambulatory Visit (HOSPITAL_COMMUNITY): Payer: Medicare Other | Attending: Cardiology

## 2015-01-27 ENCOUNTER — Ambulatory Visit (INDEPENDENT_AMBULATORY_CARE_PROVIDER_SITE_OTHER): Payer: Medicare Other

## 2015-01-27 DIAGNOSIS — I701 Atherosclerosis of renal artery: Secondary | ICD-10-CM | POA: Insufficient documentation

## 2015-01-27 DIAGNOSIS — I059 Rheumatic mitral valve disease, unspecified: Secondary | ICD-10-CM | POA: Insufficient documentation

## 2015-01-27 DIAGNOSIS — Z87891 Personal history of nicotine dependence: Secondary | ICD-10-CM | POA: Diagnosis not present

## 2015-01-27 DIAGNOSIS — N183 Chronic kidney disease, stage 3 (moderate): Secondary | ICD-10-CM | POA: Insufficient documentation

## 2015-01-27 DIAGNOSIS — I441 Atrioventricular block, second degree: Secondary | ICD-10-CM

## 2015-01-27 DIAGNOSIS — I517 Cardiomegaly: Secondary | ICD-10-CM | POA: Insufficient documentation

## 2015-01-27 DIAGNOSIS — R5383 Other fatigue: Secondary | ICD-10-CM

## 2015-01-27 DIAGNOSIS — E785 Hyperlipidemia, unspecified: Secondary | ICD-10-CM | POA: Insufficient documentation

## 2015-01-27 DIAGNOSIS — R0600 Dyspnea, unspecified: Secondary | ICD-10-CM | POA: Insufficient documentation

## 2015-01-27 DIAGNOSIS — I714 Abdominal aortic aneurysm, without rupture: Secondary | ICD-10-CM | POA: Diagnosis not present

## 2015-01-27 DIAGNOSIS — I129 Hypertensive chronic kidney disease with stage 1 through stage 4 chronic kidney disease, or unspecified chronic kidney disease: Secondary | ICD-10-CM | POA: Insufficient documentation

## 2015-01-27 DIAGNOSIS — I259 Chronic ischemic heart disease, unspecified: Secondary | ICD-10-CM | POA: Insufficient documentation

## 2015-01-27 DIAGNOSIS — E119 Type 2 diabetes mellitus without complications: Secondary | ICD-10-CM | POA: Diagnosis not present

## 2015-02-01 ENCOUNTER — Other Ambulatory Visit: Payer: Self-pay | Admitting: Nurse Practitioner

## 2015-02-01 ENCOUNTER — Ambulatory Visit (HOSPITAL_COMMUNITY): Payer: Medicare Other | Attending: Internal Medicine

## 2015-02-01 ENCOUNTER — Encounter (HOSPITAL_COMMUNITY): Payer: Medicare Other

## 2015-02-01 DIAGNOSIS — J449 Chronic obstructive pulmonary disease, unspecified: Secondary | ICD-10-CM | POA: Insufficient documentation

## 2015-02-01 DIAGNOSIS — E119 Type 2 diabetes mellitus without complications: Secondary | ICD-10-CM | POA: Insufficient documentation

## 2015-02-01 DIAGNOSIS — E7800 Pure hypercholesterolemia, unspecified: Secondary | ICD-10-CM | POA: Diagnosis not present

## 2015-02-01 DIAGNOSIS — R0609 Other forms of dyspnea: Secondary | ICD-10-CM | POA: Diagnosis not present

## 2015-02-01 DIAGNOSIS — R0602 Shortness of breath: Secondary | ICD-10-CM | POA: Diagnosis not present

## 2015-02-01 DIAGNOSIS — R5383 Other fatigue: Secondary | ICD-10-CM | POA: Diagnosis not present

## 2015-02-01 DIAGNOSIS — R9439 Abnormal result of other cardiovascular function study: Secondary | ICD-10-CM | POA: Diagnosis not present

## 2015-02-01 DIAGNOSIS — I10 Essential (primary) hypertension: Secondary | ICD-10-CM | POA: Diagnosis not present

## 2015-02-01 LAB — MYOCARDIAL PERFUSION IMAGING
LV dias vol: 93 mL
LV sys vol: 47 mL
Peak HR: 86 {beats}/min
RATE: 0.38
Rest HR: 52 {beats}/min
SDS: 2
SRS: 9
SSS: 9
TID: 1.05

## 2015-02-01 MED ORDER — TECHNETIUM TC 99M SESTAMIBI GENERIC - CARDIOLITE
10.1000 | Freq: Once | INTRAVENOUS | Status: AC | PRN
  Administered 2015-02-01: 10.1 via INTRAVENOUS

## 2015-02-01 MED ORDER — REGADENOSON 0.4 MG/5ML IV SOLN
0.4000 mg | Freq: Once | INTRAVENOUS | Status: AC
  Administered 2015-02-01: 0.4 mg via INTRAVENOUS

## 2015-02-01 MED ORDER — TECHNETIUM TC 99M SESTAMIBI GENERIC - CARDIOLITE
31.6000 | Freq: Once | INTRAVENOUS | Status: AC | PRN
  Administered 2015-02-01: 32 via INTRAVENOUS

## 2015-02-04 ENCOUNTER — Ambulatory Visit (HOSPITAL_COMMUNITY)
Admission: RE | Admit: 2015-02-04 | Discharge: 2015-02-04 | Disposition: A | Discharge status: Home / Self Care | Payer: Medicare Other | Source: Ambulatory Visit | Attending: Cardiovascular Disease | Admitting: Cardiovascular Disease

## 2015-02-04 DIAGNOSIS — I701 Atherosclerosis of renal artery: Secondary | ICD-10-CM

## 2015-02-04 DIAGNOSIS — I259 Chronic ischemic heart disease, unspecified: Secondary | ICD-10-CM | POA: Diagnosis not present

## 2015-02-04 DIAGNOSIS — E785 Hyperlipidemia, unspecified: Secondary | ICD-10-CM | POA: Insufficient documentation

## 2015-02-04 DIAGNOSIS — E119 Type 2 diabetes mellitus without complications: Secondary | ICD-10-CM | POA: Diagnosis not present

## 2015-02-04 DIAGNOSIS — R0600 Dyspnea, unspecified: Secondary | ICD-10-CM | POA: Diagnosis not present

## 2015-02-04 DIAGNOSIS — I10 Essential (primary) hypertension: Secondary | ICD-10-CM | POA: Diagnosis not present

## 2015-02-04 DIAGNOSIS — R5383 Other fatigue: Secondary | ICD-10-CM | POA: Insufficient documentation

## 2015-02-10 ENCOUNTER — Encounter: Payer: Self-pay | Admitting: Internal Medicine

## 2015-02-10 ENCOUNTER — Ambulatory Visit (INDEPENDENT_AMBULATORY_CARE_PROVIDER_SITE_OTHER): Payer: Medicare Other | Admitting: Internal Medicine

## 2015-02-10 VITALS — BP 124/68 | HR 60 | Ht 70.0 in | Wt 228.2 lb

## 2015-02-10 DIAGNOSIS — I10 Essential (primary) hypertension: Secondary | ICD-10-CM | POA: Diagnosis not present

## 2015-02-10 DIAGNOSIS — I2510 Atherosclerotic heart disease of native coronary artery without angina pectoris: Secondary | ICD-10-CM

## 2015-02-10 DIAGNOSIS — I441 Atrioventricular block, second degree: Secondary | ICD-10-CM

## 2015-02-10 NOTE — Progress Notes (Signed)
Electrophysiology Office Note   Date:  02/10/2015   ID:  [name] [name], DOB Aug 14, 1931, MRN [id number]  PCP:  Jani Gravel, MD  Cardiologist:  Dr Meda Coffee (previously Dr Ron Parker) Primary Electrophysiologist: Thompson Grayer, MD    Chief Complaint  Patient presents with  . 2nd degree AV block     History of Present Illness: [name] [name] is a 81 y.o. male who presents today for electrophysiology evaluation.   He presents today for evaluation of second degree AV block.  He recently presented to [facility] office for concerns of fluctuating blood pressures on routine home checks.  He had noticed heart rates 40s-80s as well as transiently elevated blood pressures.  He reports that he doesn't have much energy but feels "pretty good" most of the time.  He has some imbalance.  He denies dizziness typically but with very quick change in postural may have transient dizziness.  He denies syncope or near syncope.  Today, he denies symptoms of palpitations, chest pain, shortness of breath (above baseline), orthopnea, PND,  bleeding, or neurologic sequela.  He has chronic edema.  He has chronic renal failure and is followed by nephrology.  The patient is tolerating medications without difficulties and is otherwise without complaint today.    Past Medical History  Diagnosis Date  . HTN (hypertension)   . Dyslipidemia   . CVA (cerebral vascular accident) ([place])     residual R leg weakness  . AAA (abdominal aortic aneurysm) [facility])     Surgical repair June, 2014  . Renal artery stenosis ([place])   . Tobacco abuse     quit 2014  . CAD (coronary artery disease)   . Hx of CABG     2005  . Neuropathy ([place])     Right foot  . Leg pain   . Peripheral vascular disease ([place])   . Carotid artery occlusion   . COPD (chronic obstructive pulmonary disease) ([place]) 08/15/2012  . Status post abdominal aortic aneurysm repair     June, 2014  . Fall at home Feb.4, 2015    Fx Right Hip  . Diabetes mellitus without  complication ([place])   . Morbid obesity ([place])   . Chronic renal insufficiency    Past Surgical History  Procedure Laterality Date  . Coronary artery bypass graft  2005    x4  . Spine surgery  1977    Lumbar HNP surgery  . Aorta - bilateral femoral artery bypass graft N/A 08/09/2012    Procedure: AORTA BIFEMORAL BYPASS GRAFT;  Surgeon: Rosetta Posner, MD;  Location: [place];  Service: Vascular;  Laterality: N/A;  . Hip arthroplasty Right 04/09/2013    Procedure: ARTHROPLASTY BIPOLAR HIP;  Surgeon: Gearlean Alf, MD;  [facility];  Service: Orthopedics;  Laterality: Right;  . Joint replacement Right Feb. 4, 2015    Hip- Pt fell  . Abdominal aortagram N/A 08/07/2012    Procedure: ABDOMINAL AORTAGRAM;  Surgeon: Rosetta Posner, MD;  Location: [facility] CATH LAB;  Service: Cardiovascular;  Laterality: N/A;     Current Outpatient Prescriptions  Medication Sig Dispense Refill  . lisinopril (PRINIVIL,ZESTRIL) 10 MG tablet Take 10 mg by mouth daily.    Marland Kitchen aspirin 81 MG tablet Take 81 mg by mouth daily.    Marland Kitchen atorvastatin (LIPITOR) 40 MG tablet Take 40 mg by mouth daily.     . cholecalciferol (VITAMIN D) 1000 UNITS tablet Take 1,000 Units by mouth daily.     . clopidogrel (  PLAVIX) 75 MG tablet Take 1 tablet by mouth daily.    Marland Kitchen escitalopram (LEXAPRO) 5 MG tablet Take 5 mg by mouth daily.     . furosemide (LASIX) 20 MG tablet Take 10 mg by mouth daily.    Marland Kitchen gabapentin (NEURONTIN) 300 MG capsule TAKE (1) CAPSULE DAILY. 60 capsule 3  . glimepiride (AMARYL) 1 MG tablet Take 1 mg by mouth daily with breakfast.    . oxybutynin (DITROPAN-XL) 5 MG 24 hr tablet Take 5 mg by mouth daily.     . pioglitazone (ACTOS) 15 MG tablet Take 15 mg by mouth daily.    Marland Kitchen PROAIR HFA 108 (90 BASE) MCG/ACT inhaler Inhale 1 puff into the lungs every 4 (four) hours as needed.     . saxagliptin HCl (ONGLYZA) 5 MG TABS tablet Take 2.5 mg by mouth daily.     . traMADol (ULTRAM) 50 MG tablet Take 1 tablet by mouth every 6 (six) hours as  needed for moderate pain.     . vitamin B-12 (CYANOCOBALAMIN) 100 MCG tablet Take 100 mcg by mouth daily.     Marland Kitchen zolpidem (AMBIEN) 10 MG tablet Take 5 mg by mouth at bedtime as needed for sleep.      No current facility-administered medications for this visit.    Allergies:   Review of patient's allergies indicates no known allergies.   Social History:  The patient  reports that he quit smoking about 2 years ago. His smoking use included Cigarettes. He has a 25 pack-year smoking history. He has never used smokeless tobacco. He reports that he drinks alcohol. He reports that he does not use illicit drugs.   Family History:  The patient's  family history includes Coronary artery disease in his father; Tuberculosis in his mother; Varicose Veins in his father.    ROS:  Please see the history of present illness.   All other systems are reviewed and negative.    PHYSICAL EXAM: VS:  BP 124/68 mmHg  Pulse 60  Ht 5\' 10"  (1.778 m)  Wt 228 lb 3.2 oz (103.511 kg)  BMI 32.74 kg/m2 , BMI Body mass index is 32.74 kg/(m^2). GEN: overweight and elderly, in no acute distress HEENT: normal Neck: no JVD, carotid bruits, or masses Cardiac: RRR; no murmurs, rubs, or gallops,no edema  Respiratory:  clear to auscultation bilaterally, normal work of breathing GI: soft, nontender, nondistended, + BS MS: no deformity or atrophy Skin: warm and dry  Neuro:    in a wheelchair today Psych: euthymic mood, full affect  EKG:  EKG from last visit reveals sinus with mobitz I second degree AV block   Recent Labs: 01/19/2015: BUN 22; Creat 1.77*; Hemoglobin 12.7*; Platelets 239; Potassium 4.2; Sodium 140; TSH 1.989    Lipid Panel  No results found for: CHOL, TRIG, HDL, CHOLHDL, VLDL, LDLCALC, LDLDIRECT   Wt Readings from Last 3 Encounters:  02/10/15 228 lb 3.2 oz (103.511 kg)  02/01/15 229 lb (103.874 kg)  01/19/15 229 lb 12.8 oz (104.237 kg)      Other studies Reviewed: Additional studies/ records  that were reviewed today include: holter monitor Review of the above records today demonstrates: sinus rhythm, mobitz I second degree AV block observed, Eulis Foster notes are reviewed   ASSESSMENT AND PLAN:  1. Second degree AV block The patient has daytime second degree AV block demonstrated.  Appears to be mobitz I.  Pt with fatigue but has advanced age and other potentially causes for fatigue.  Has  h/o sleep apnea but cannot wear CPAP.  Though I cannot be certain that PPM will improve his quality of life, we did discuss pacing today.  At this time, he would prefer a conservative strategy.  He would like to avoid procedures.  If he has worsening symptoms of bradycardia then he may be more willing to proceed at that time.  2. Obesity Body mass index is 32.74 kg/(m^2). Weight reduction advised  3. HTn Stable No change required today  4. CAD No ischemic symptoms   Follow-up with Dr Meda Coffee I will see as needed in the future should he decide to reconsider pacemaker implantation.    Army Fossa, MD  02/10/2015 2:58 PM     [facility] [address] [place] [address] [phone] (office) [phone] (fax)

## 2015-02-10 NOTE — Patient Instructions (Signed)
Medication Instructions:  Your physician recommends that you continue on your current medications as directed. Please refer to the Current Medication list given to you today.   Labwork: None ordered   Testing/Procedures: None ordered   Follow-Up: Your physician recommends that you schedule a follow-up appointment in: 3 months with Dr Meda Coffee   Any Other Special Instructions Will Be Listed Below (If Applicable).     If you need a refill on your cardiac medications before your next appointment, please call your pharmacy.     Any Other Special Instructions Will Be Listed Below (If Applicable).     If you need a refill on your cardiac medications before your next appointment, please call your pharmacy.

## 2015-02-12 ENCOUNTER — Ambulatory Visit: Payer: Medicare Other | Admitting: Cardiology

## 2015-03-10 DIAGNOSIS — N183 Chronic kidney disease, stage 3 (moderate): Secondary | ICD-10-CM | POA: Diagnosis not present

## 2015-03-10 DIAGNOSIS — N2581 Secondary hyperparathyroidism of renal origin: Secondary | ICD-10-CM | POA: Diagnosis not present

## 2015-03-10 DIAGNOSIS — D509 Iron deficiency anemia, unspecified: Secondary | ICD-10-CM | POA: Diagnosis not present

## 2015-03-10 DIAGNOSIS — R531 Weakness: Secondary | ICD-10-CM | POA: Diagnosis not present

## 2015-03-10 DIAGNOSIS — R269 Unspecified abnormalities of gait and mobility: Secondary | ICD-10-CM | POA: Diagnosis not present

## 2015-03-10 DIAGNOSIS — Z87891 Personal history of nicotine dependence: Secondary | ICD-10-CM | POA: Diagnosis not present

## 2015-03-10 DIAGNOSIS — Z7902 Long term (current) use of antithrombotics/antiplatelets: Secondary | ICD-10-CM | POA: Diagnosis not present

## 2015-03-16 DIAGNOSIS — Z7902 Long term (current) use of antithrombotics/antiplatelets: Secondary | ICD-10-CM | POA: Diagnosis not present

## 2015-03-16 DIAGNOSIS — Z87891 Personal history of nicotine dependence: Secondary | ICD-10-CM | POA: Diagnosis not present

## 2015-03-16 DIAGNOSIS — R269 Unspecified abnormalities of gait and mobility: Secondary | ICD-10-CM | POA: Diagnosis not present

## 2015-03-16 DIAGNOSIS — R531 Weakness: Secondary | ICD-10-CM | POA: Diagnosis not present

## 2015-03-18 DIAGNOSIS — Z7902 Long term (current) use of antithrombotics/antiplatelets: Secondary | ICD-10-CM | POA: Diagnosis not present

## 2015-03-18 DIAGNOSIS — Z87891 Personal history of nicotine dependence: Secondary | ICD-10-CM | POA: Diagnosis not present

## 2015-03-18 DIAGNOSIS — R269 Unspecified abnormalities of gait and mobility: Secondary | ICD-10-CM | POA: Diagnosis not present

## 2015-03-18 DIAGNOSIS — R531 Weakness: Secondary | ICD-10-CM | POA: Diagnosis not present

## 2015-03-22 DIAGNOSIS — R269 Unspecified abnormalities of gait and mobility: Secondary | ICD-10-CM | POA: Diagnosis not present

## 2015-03-22 DIAGNOSIS — R531 Weakness: Secondary | ICD-10-CM | POA: Diagnosis not present

## 2015-03-22 DIAGNOSIS — Z87891 Personal history of nicotine dependence: Secondary | ICD-10-CM | POA: Diagnosis not present

## 2015-03-22 DIAGNOSIS — Z7902 Long term (current) use of antithrombotics/antiplatelets: Secondary | ICD-10-CM | POA: Diagnosis not present

## 2015-03-25 DIAGNOSIS — Z87891 Personal history of nicotine dependence: Secondary | ICD-10-CM | POA: Diagnosis not present

## 2015-03-25 DIAGNOSIS — R269 Unspecified abnormalities of gait and mobility: Secondary | ICD-10-CM | POA: Diagnosis not present

## 2015-03-25 DIAGNOSIS — R531 Weakness: Secondary | ICD-10-CM | POA: Diagnosis not present

## 2015-03-25 DIAGNOSIS — Z7902 Long term (current) use of antithrombotics/antiplatelets: Secondary | ICD-10-CM | POA: Diagnosis not present

## 2015-03-29 DIAGNOSIS — Z7902 Long term (current) use of antithrombotics/antiplatelets: Secondary | ICD-10-CM | POA: Diagnosis not present

## 2015-03-29 DIAGNOSIS — R269 Unspecified abnormalities of gait and mobility: Secondary | ICD-10-CM | POA: Diagnosis not present

## 2015-03-29 DIAGNOSIS — R531 Weakness: Secondary | ICD-10-CM | POA: Diagnosis not present

## 2015-03-29 DIAGNOSIS — Z87891 Personal history of nicotine dependence: Secondary | ICD-10-CM | POA: Diagnosis not present

## 2015-04-01 DIAGNOSIS — Z7902 Long term (current) use of antithrombotics/antiplatelets: Secondary | ICD-10-CM | POA: Diagnosis not present

## 2015-04-01 DIAGNOSIS — R269 Unspecified abnormalities of gait and mobility: Secondary | ICD-10-CM | POA: Diagnosis not present

## 2015-04-01 DIAGNOSIS — Z87891 Personal history of nicotine dependence: Secondary | ICD-10-CM | POA: Diagnosis not present

## 2015-04-01 DIAGNOSIS — R531 Weakness: Secondary | ICD-10-CM | POA: Diagnosis not present

## 2015-04-05 DIAGNOSIS — Z7902 Long term (current) use of antithrombotics/antiplatelets: Secondary | ICD-10-CM | POA: Diagnosis not present

## 2015-04-05 DIAGNOSIS — Z87891 Personal history of nicotine dependence: Secondary | ICD-10-CM | POA: Diagnosis not present

## 2015-04-05 DIAGNOSIS — R531 Weakness: Secondary | ICD-10-CM | POA: Diagnosis not present

## 2015-04-05 DIAGNOSIS — R269 Unspecified abnormalities of gait and mobility: Secondary | ICD-10-CM | POA: Diagnosis not present

## 2015-04-07 DIAGNOSIS — Z7902 Long term (current) use of antithrombotics/antiplatelets: Secondary | ICD-10-CM | POA: Diagnosis not present

## 2015-04-07 DIAGNOSIS — R269 Unspecified abnormalities of gait and mobility: Secondary | ICD-10-CM | POA: Diagnosis not present

## 2015-04-07 DIAGNOSIS — Z87891 Personal history of nicotine dependence: Secondary | ICD-10-CM | POA: Diagnosis not present

## 2015-04-07 DIAGNOSIS — R531 Weakness: Secondary | ICD-10-CM | POA: Diagnosis not present

## 2015-04-12 DIAGNOSIS — R269 Unspecified abnormalities of gait and mobility: Secondary | ICD-10-CM | POA: Diagnosis not present

## 2015-04-12 DIAGNOSIS — Z7902 Long term (current) use of antithrombotics/antiplatelets: Secondary | ICD-10-CM | POA: Diagnosis not present

## 2015-04-12 DIAGNOSIS — R531 Weakness: Secondary | ICD-10-CM | POA: Diagnosis not present

## 2015-04-12 DIAGNOSIS — Z87891 Personal history of nicotine dependence: Secondary | ICD-10-CM | POA: Diagnosis not present

## 2015-04-15 DIAGNOSIS — R531 Weakness: Secondary | ICD-10-CM | POA: Diagnosis not present

## 2015-04-15 DIAGNOSIS — Z7902 Long term (current) use of antithrombotics/antiplatelets: Secondary | ICD-10-CM | POA: Diagnosis not present

## 2015-04-15 DIAGNOSIS — R269 Unspecified abnormalities of gait and mobility: Secondary | ICD-10-CM | POA: Diagnosis not present

## 2015-04-15 DIAGNOSIS — Z87891 Personal history of nicotine dependence: Secondary | ICD-10-CM | POA: Diagnosis not present

## 2015-04-19 DIAGNOSIS — Z87891 Personal history of nicotine dependence: Secondary | ICD-10-CM | POA: Diagnosis not present

## 2015-04-19 DIAGNOSIS — Z7902 Long term (current) use of antithrombotics/antiplatelets: Secondary | ICD-10-CM | POA: Diagnosis not present

## 2015-04-19 DIAGNOSIS — R269 Unspecified abnormalities of gait and mobility: Secondary | ICD-10-CM | POA: Diagnosis not present

## 2015-04-19 DIAGNOSIS — R531 Weakness: Secondary | ICD-10-CM | POA: Diagnosis not present

## 2015-04-22 DIAGNOSIS — Z87891 Personal history of nicotine dependence: Secondary | ICD-10-CM | POA: Diagnosis not present

## 2015-04-22 DIAGNOSIS — R269 Unspecified abnormalities of gait and mobility: Secondary | ICD-10-CM | POA: Diagnosis not present

## 2015-04-22 DIAGNOSIS — Z7902 Long term (current) use of antithrombotics/antiplatelets: Secondary | ICD-10-CM | POA: Diagnosis not present

## 2015-04-22 DIAGNOSIS — R531 Weakness: Secondary | ICD-10-CM | POA: Diagnosis not present

## 2015-04-26 DIAGNOSIS — Z87891 Personal history of nicotine dependence: Secondary | ICD-10-CM | POA: Diagnosis not present

## 2015-04-26 DIAGNOSIS — Z7902 Long term (current) use of antithrombotics/antiplatelets: Secondary | ICD-10-CM | POA: Diagnosis not present

## 2015-04-26 DIAGNOSIS — R269 Unspecified abnormalities of gait and mobility: Secondary | ICD-10-CM | POA: Diagnosis not present

## 2015-04-26 DIAGNOSIS — R531 Weakness: Secondary | ICD-10-CM | POA: Diagnosis not present

## 2015-04-29 DIAGNOSIS — R269 Unspecified abnormalities of gait and mobility: Secondary | ICD-10-CM | POA: Diagnosis not present

## 2015-04-29 DIAGNOSIS — Z87891 Personal history of nicotine dependence: Secondary | ICD-10-CM | POA: Diagnosis not present

## 2015-04-29 DIAGNOSIS — R531 Weakness: Secondary | ICD-10-CM | POA: Diagnosis not present

## 2015-04-29 DIAGNOSIS — Z7902 Long term (current) use of antithrombotics/antiplatelets: Secondary | ICD-10-CM | POA: Diagnosis not present

## 2015-05-03 DIAGNOSIS — Z7902 Long term (current) use of antithrombotics/antiplatelets: Secondary | ICD-10-CM | POA: Diagnosis not present

## 2015-05-03 DIAGNOSIS — R531 Weakness: Secondary | ICD-10-CM | POA: Diagnosis not present

## 2015-05-03 DIAGNOSIS — Z87891 Personal history of nicotine dependence: Secondary | ICD-10-CM | POA: Diagnosis not present

## 2015-05-03 DIAGNOSIS — R269 Unspecified abnormalities of gait and mobility: Secondary | ICD-10-CM | POA: Diagnosis not present

## 2015-05-05 DIAGNOSIS — E1121 Type 2 diabetes mellitus with diabetic nephropathy: Secondary | ICD-10-CM | POA: Diagnosis not present

## 2015-05-05 DIAGNOSIS — I639 Cerebral infarction, unspecified: Secondary | ICD-10-CM | POA: Diagnosis not present

## 2015-05-05 DIAGNOSIS — N183 Chronic kidney disease, stage 3 (moderate): Secondary | ICD-10-CM | POA: Diagnosis not present

## 2015-05-05 DIAGNOSIS — I441 Atrioventricular block, second degree: Secondary | ICD-10-CM | POA: Diagnosis not present

## 2015-05-05 DIAGNOSIS — D472 Monoclonal gammopathy: Secondary | ICD-10-CM | POA: Diagnosis not present

## 2015-05-05 DIAGNOSIS — I714 Abdominal aortic aneurysm, without rupture: Secondary | ICD-10-CM | POA: Diagnosis not present

## 2015-05-05 DIAGNOSIS — D509 Iron deficiency anemia, unspecified: Secondary | ICD-10-CM | POA: Diagnosis not present

## 2015-05-05 DIAGNOSIS — E785 Hyperlipidemia, unspecified: Secondary | ICD-10-CM | POA: Diagnosis not present

## 2015-05-05 DIAGNOSIS — I10 Essential (primary) hypertension: Secondary | ICD-10-CM | POA: Diagnosis not present

## 2015-05-05 DIAGNOSIS — N2581 Secondary hyperparathyroidism of renal origin: Secondary | ICD-10-CM | POA: Diagnosis not present

## 2015-05-05 DIAGNOSIS — I739 Peripheral vascular disease, unspecified: Secondary | ICD-10-CM | POA: Diagnosis not present

## 2015-05-05 DIAGNOSIS — I2510 Atherosclerotic heart disease of native coronary artery without angina pectoris: Secondary | ICD-10-CM | POA: Diagnosis not present

## 2015-05-06 DIAGNOSIS — R269 Unspecified abnormalities of gait and mobility: Secondary | ICD-10-CM | POA: Diagnosis not present

## 2015-05-06 DIAGNOSIS — Z7902 Long term (current) use of antithrombotics/antiplatelets: Secondary | ICD-10-CM | POA: Diagnosis not present

## 2015-05-06 DIAGNOSIS — Z87891 Personal history of nicotine dependence: Secondary | ICD-10-CM | POA: Diagnosis not present

## 2015-05-06 DIAGNOSIS — R531 Weakness: Secondary | ICD-10-CM | POA: Diagnosis not present

## 2015-05-09 DIAGNOSIS — I10 Essential (primary) hypertension: Secondary | ICD-10-CM | POA: Diagnosis not present

## 2015-05-09 DIAGNOSIS — J449 Chronic obstructive pulmonary disease, unspecified: Secondary | ICD-10-CM | POA: Diagnosis not present

## 2015-05-09 DIAGNOSIS — E1151 Type 2 diabetes mellitus with diabetic peripheral angiopathy without gangrene: Secondary | ICD-10-CM | POA: Diagnosis not present

## 2015-05-09 DIAGNOSIS — M6289 Other specified disorders of muscle: Secondary | ICD-10-CM | POA: Diagnosis not present

## 2015-05-09 DIAGNOSIS — M1990 Unspecified osteoarthritis, unspecified site: Secondary | ICD-10-CM | POA: Diagnosis not present

## 2015-05-09 DIAGNOSIS — R262 Difficulty in walking, not elsewhere classified: Secondary | ICD-10-CM | POA: Diagnosis not present

## 2015-05-10 DIAGNOSIS — R262 Difficulty in walking, not elsewhere classified: Secondary | ICD-10-CM | POA: Diagnosis not present

## 2015-05-10 DIAGNOSIS — I10 Essential (primary) hypertension: Secondary | ICD-10-CM | POA: Diagnosis not present

## 2015-05-10 DIAGNOSIS — E1151 Type 2 diabetes mellitus with diabetic peripheral angiopathy without gangrene: Secondary | ICD-10-CM | POA: Diagnosis not present

## 2015-05-10 DIAGNOSIS — J449 Chronic obstructive pulmonary disease, unspecified: Secondary | ICD-10-CM | POA: Diagnosis not present

## 2015-05-10 DIAGNOSIS — M1990 Unspecified osteoarthritis, unspecified site: Secondary | ICD-10-CM | POA: Diagnosis not present

## 2015-05-10 DIAGNOSIS — M6289 Other specified disorders of muscle: Secondary | ICD-10-CM | POA: Diagnosis not present

## 2015-05-12 DIAGNOSIS — E7800 Pure hypercholesterolemia, unspecified: Secondary | ICD-10-CM | POA: Diagnosis not present

## 2015-05-12 DIAGNOSIS — I10 Essential (primary) hypertension: Secondary | ICD-10-CM | POA: Diagnosis not present

## 2015-05-12 DIAGNOSIS — E119 Type 2 diabetes mellitus without complications: Secondary | ICD-10-CM | POA: Diagnosis not present

## 2015-05-13 DIAGNOSIS — R262 Difficulty in walking, not elsewhere classified: Secondary | ICD-10-CM | POA: Diagnosis not present

## 2015-05-13 DIAGNOSIS — M6289 Other specified disorders of muscle: Secondary | ICD-10-CM | POA: Diagnosis not present

## 2015-05-13 DIAGNOSIS — J449 Chronic obstructive pulmonary disease, unspecified: Secondary | ICD-10-CM | POA: Diagnosis not present

## 2015-05-13 DIAGNOSIS — E1151 Type 2 diabetes mellitus with diabetic peripheral angiopathy without gangrene: Secondary | ICD-10-CM | POA: Diagnosis not present

## 2015-05-13 DIAGNOSIS — M1990 Unspecified osteoarthritis, unspecified site: Secondary | ICD-10-CM | POA: Diagnosis not present

## 2015-05-13 DIAGNOSIS — I10 Essential (primary) hypertension: Secondary | ICD-10-CM | POA: Diagnosis not present

## 2015-05-17 DIAGNOSIS — I10 Essential (primary) hypertension: Secondary | ICD-10-CM | POA: Diagnosis not present

## 2015-05-17 DIAGNOSIS — M1990 Unspecified osteoarthritis, unspecified site: Secondary | ICD-10-CM | POA: Diagnosis not present

## 2015-05-17 DIAGNOSIS — J449 Chronic obstructive pulmonary disease, unspecified: Secondary | ICD-10-CM | POA: Diagnosis not present

## 2015-05-17 DIAGNOSIS — R262 Difficulty in walking, not elsewhere classified: Secondary | ICD-10-CM | POA: Diagnosis not present

## 2015-05-17 DIAGNOSIS — E1151 Type 2 diabetes mellitus with diabetic peripheral angiopathy without gangrene: Secondary | ICD-10-CM | POA: Diagnosis not present

## 2015-05-17 DIAGNOSIS — M6289 Other specified disorders of muscle: Secondary | ICD-10-CM | POA: Diagnosis not present

## 2015-05-20 DIAGNOSIS — R262 Difficulty in walking, not elsewhere classified: Secondary | ICD-10-CM | POA: Diagnosis not present

## 2015-05-20 DIAGNOSIS — E1151 Type 2 diabetes mellitus with diabetic peripheral angiopathy without gangrene: Secondary | ICD-10-CM | POA: Diagnosis not present

## 2015-05-20 DIAGNOSIS — I10 Essential (primary) hypertension: Secondary | ICD-10-CM | POA: Diagnosis not present

## 2015-05-20 DIAGNOSIS — J449 Chronic obstructive pulmonary disease, unspecified: Secondary | ICD-10-CM | POA: Diagnosis not present

## 2015-05-20 DIAGNOSIS — M1990 Unspecified osteoarthritis, unspecified site: Secondary | ICD-10-CM | POA: Diagnosis not present

## 2015-05-20 DIAGNOSIS — M6289 Other specified disorders of muscle: Secondary | ICD-10-CM | POA: Diagnosis not present

## 2015-05-24 DIAGNOSIS — E1151 Type 2 diabetes mellitus with diabetic peripheral angiopathy without gangrene: Secondary | ICD-10-CM | POA: Diagnosis not present

## 2015-05-24 DIAGNOSIS — I10 Essential (primary) hypertension: Secondary | ICD-10-CM | POA: Diagnosis not present

## 2015-05-24 DIAGNOSIS — M6289 Other specified disorders of muscle: Secondary | ICD-10-CM | POA: Diagnosis not present

## 2015-05-24 DIAGNOSIS — M1990 Unspecified osteoarthritis, unspecified site: Secondary | ICD-10-CM | POA: Diagnosis not present

## 2015-05-24 DIAGNOSIS — R262 Difficulty in walking, not elsewhere classified: Secondary | ICD-10-CM | POA: Diagnosis not present

## 2015-05-24 DIAGNOSIS — J449 Chronic obstructive pulmonary disease, unspecified: Secondary | ICD-10-CM | POA: Diagnosis not present

## 2015-05-27 DIAGNOSIS — R262 Difficulty in walking, not elsewhere classified: Secondary | ICD-10-CM | POA: Diagnosis not present

## 2015-05-27 DIAGNOSIS — E1151 Type 2 diabetes mellitus with diabetic peripheral angiopathy without gangrene: Secondary | ICD-10-CM | POA: Diagnosis not present

## 2015-05-27 DIAGNOSIS — J449 Chronic obstructive pulmonary disease, unspecified: Secondary | ICD-10-CM | POA: Diagnosis not present

## 2015-05-27 DIAGNOSIS — M1990 Unspecified osteoarthritis, unspecified site: Secondary | ICD-10-CM | POA: Diagnosis not present

## 2015-05-27 DIAGNOSIS — M6289 Other specified disorders of muscle: Secondary | ICD-10-CM | POA: Diagnosis not present

## 2015-05-27 DIAGNOSIS — I10 Essential (primary) hypertension: Secondary | ICD-10-CM | POA: Diagnosis not present

## 2015-05-28 ENCOUNTER — Ambulatory Visit (INDEPENDENT_AMBULATORY_CARE_PROVIDER_SITE_OTHER): Payer: Medicare Other | Admitting: Cardiology

## 2015-05-28 ENCOUNTER — Encounter: Payer: Self-pay | Admitting: Cardiology

## 2015-05-28 VITALS — BP 130/60 | HR 68 | Ht 70.0 in | Wt 224.0 lb

## 2015-05-28 DIAGNOSIS — I441 Atrioventricular block, second degree: Secondary | ICD-10-CM | POA: Diagnosis not present

## 2015-05-28 DIAGNOSIS — N183 Chronic kidney disease, stage 3 unspecified: Secondary | ICD-10-CM

## 2015-05-28 DIAGNOSIS — I5032 Chronic diastolic (congestive) heart failure: Secondary | ICD-10-CM

## 2015-05-28 DIAGNOSIS — Z951 Presence of aortocoronary bypass graft: Secondary | ICD-10-CM | POA: Diagnosis not present

## 2015-05-28 DIAGNOSIS — R42 Dizziness and giddiness: Secondary | ICD-10-CM | POA: Diagnosis not present

## 2015-05-28 NOTE — Patient Instructions (Signed)
Medication Instructions:  Your physician recommends that you continue on your current medications as directed. Please refer to the Current Medication list given to you today.  Labwork: NONE  Testing/Procedures: NONE  Follow-Up: Your physician wants you to follow-up in: [place] will receive a reminder letter in the mail two months in advance. If you don't receive a letter, please call our office to schedule the follow-up appointment.  Any Other Special Instructions Will Be Listed Below (If Applicable).     If you need a refill on your cardiac medications before your next appointment, please call your pharmacy.

## 2015-05-28 NOTE — Progress Notes (Signed)
Patient ID: [name] [name], male   DOB: 04-08-1931, 82 y.o.   MRN: [id number]    CARDIOLOGY OFFICE NOTE  Date:  05/28/2015   [name] [name] Date of Birth: 05/25/1931 Medical Record [id number]  PCP:  Jani Gravel, MD  Cardiologist:  Former patient of Dr. Kae Heller, now transferred to me Meda Coffee, [place])    Chief complain: dizziness, balance problems  History of Present Illness: [name] [name] is a 82 y.o. male with history of hypertension, CK D stage III, AAA repair, prior CVA, PVD and CAD with remote CABG by Dr. Roxan Hockey. His former patient of Dr. Ron Parker last seen in August 2016 and in between was seen by Truitt Merle. Patient denies any chest pain he has stable shortness of breath. He was diagnosed with significant bradycardia and second-degree AV block type I and seen by Dr. Rayann Heman who suggested a pacemaker placement. After hearing all the possible complications patient decided to proceed with conservative management for now. He has on and off lower extremity edema, no orthopnea no paroxysmal nocturnal dyspnea. His major problem was balance issue and dizziness mostly when he changes position in bed and he feels like room is spinning around him. He has been working with a physical therapist that comes twice a week to his house and his symptoms have improved. He denies any palpitations or syncope.   Past Medical History  Diagnosis Date  . HTN (hypertension)   . Dyslipidemia   . CVA (cerebral vascular accident) ([place])     residual R leg weakness  . AAA (abdominal aortic aneurysm) [facility])     Surgical repair June, 2014  . Renal artery stenosis ([place])   . Tobacco abuse     quit 2014  . CAD (coronary artery disease)   . Hx of CABG     2005  . Neuropathy ([place])     Right foot  . Leg pain   . Peripheral vascular disease ([place])   . Carotid artery occlusion   . COPD (chronic obstructive pulmonary disease) ([place]) 08/15/2012  . Status post abdominal aortic aneurysm repair     June, 2014  .  Fall at home Feb.4, 2015    Fx Right Hip  . Diabetes mellitus without complication ([place])   . Morbid obesity ([place])   . Chronic renal insufficiency     Past Surgical History  Procedure Laterality Date  . Coronary artery bypass graft  2005    x4  . Spine surgery  1977    Lumbar HNP surgery  . Aorta - bilateral femoral artery bypass graft N/A 08/09/2012    Procedure: AORTA BIFEMORAL BYPASS GRAFT;  Surgeon: Rosetta Posner, MD;  Location: [place];  Service: Vascular;  Laterality: N/A;  . Hip arthroplasty Right 04/09/2013    Procedure: ARTHROPLASTY BIPOLAR HIP;  Surgeon: Gearlean Alf, MD;  Location[facility];  Service: Orthopedics;  Laterality: Right;  . Joint replacement Right Feb. 4, 2015    Hip- Pt fell  . Abdominal aortagram N/A 08/07/2012    Procedure: ABDOMINAL AORTAGRAM;  Surgeon: Rosetta Posner, MD;  Location: [facility] CATH LAB;  Service: Cardiovascular;  Laterality: N/A;   Medications: Current Outpatient Prescriptions  Medication Sig Dispense Refill  . aspirin 81 MG tablet Take 81 mg by mouth daily.    Marland Kitchen atorvastatin (LIPITOR) 40 MG tablet Take 40 mg by mouth daily.     . cholecalciferol (VITAMIN D) 1000 UNITS tablet Take 1,000 Units by mouth daily.     Marland Kitchen  clopidogrel (PLAVIX) 75 MG tablet Take 1 tablet by mouth daily.    Marland Kitchen escitalopram (LEXAPRO) 5 MG tablet Take 5 mg by mouth daily.     . furosemide (LASIX) 20 MG tablet Take 10 mg by mouth daily.    Marland Kitchen gabapentin (NEURONTIN) 300 MG capsule TAKE (1) CAPSULE DAILY. 60 capsule 3  . glimepiride (AMARYL) 1 MG tablet Take 1 mg by mouth daily with breakfast.    . lisinopril (PRINIVIL,ZESTRIL) 10 MG tablet Take 10 mg by mouth daily.    Marland Kitchen oxybutynin (DITROPAN-XL) 5 MG 24 hr tablet Take 5 mg by mouth daily.     . pioglitazone (ACTOS) 15 MG tablet Take 15 mg by mouth daily.    Marland Kitchen PROAIR HFA 108 (90 BASE) MCG/ACT inhaler Inhale 1 puff into the lungs every 4 (four) hours as needed.     . saxagliptin HCl (ONGLYZA) 5 MG TABS tablet Take 2.5 mg by mouth daily.      . traMADol (ULTRAM) 50 MG tablet Take 1 tablet by mouth every 6 (six) hours as needed for moderate pain.     . vitamin B-12 (CYANOCOBALAMIN) 100 MCG tablet Take 100 mcg by mouth daily.     Marland Kitchen zolpidem (AMBIEN) 10 MG tablet Take 5 mg by mouth at bedtime as needed for sleep.      No current facility-administered medications for this visit.   Allergies: No Known Allergies  Social History: The patient  reports that he quit smoking about 2 years ago. His smoking use included Cigarettes. He has a 25 pack-year smoking history. He has never used smokeless tobacco. He reports that he drinks alcohol. He reports that he does not use illicit drugs.   Family History: The patient's family history includes Coronary artery disease in his father; Tuberculosis in his mother; Varicose Veins in his father.   Review of Systems: Please see the history of present illness.   Otherwise, the review of systems is positive for none.   All other systems are reviewed and negative.   Physical Exam: VS:  BP 130/60 mmHg  Pulse 68  Ht 5\' 10"  (1.778 m)  Wt 224 lb (101.606 kg)  BMI 32.14 kg/m2 .  BMI Body mass index is 32.14 kg/(m^2).  Wt Readings from Last 3 Encounters:  05/28/15 224 lb (101.606 kg)  02/10/15 228 lb 3.2 oz (103.511 kg)  02/01/15 229 lb (103.874 kg)   General: Pleasant. Chronically ill appearing but in no acute distress.  HEENT: Normal. Neck: Supple, no JVD, carotid bruits, or masses noted.  Cardiac: Irregular rhythm. Rate a little slow. Trace edema.  Respiratory:  Lungs are fairly clear to auscultation bilaterally with normal work of breathing.  GI: Soft and nontender.  MS: No deformity or atrophy. Gait not tested - he is in a wheelchair. Skin: Warm and dry. Color is normal.  Neuro:  Strength and sensation are intact and no gross focal deficits noted.  Psych: Alert, appropriate and with normal affect.   LABORATORY DATA:  EKG:  EKG is ordered today. This demonstrates 2nd degree AV block -  type I - Wenkebach - his rate is 53.  Lab Results  Component Value Date   WBC 11.5* 01/19/2015   HGB 12.7* 01/19/2015   HCT 39.3 01/19/2015   PLT 239 01/19/2015   GLUCOSE 96 01/19/2015   ALT 21 08/19/2012   AST 31 08/19/2012   NA 140 01/19/2015   K 4.2 01/19/2015   CL 104 01/19/2015   CREATININE 1.77* 01/19/2015  BUN 22 01/19/2015   CO2 25 01/19/2015   TSH 1.989 01/19/2015   INR 0.98 04/08/2013   HGBA1C 6.1* 08/10/2012   Other Studies Reviewed Today:  Myoview Impression from 08/2011 Exercise Capacity: Lexiscan with no exercise. BP Response: Normal blood pressure response. Clinical Symptoms: No chest pain or dyspnea. ECG Impression: No significant ST segment change suggestive of ischemia. Comparison with Prior Nuclear Study: No previous nuclear study performed.  Overall Impression: Abnormal stress nuclear study with a mild, small, fixed apical defect and a medium, moderate size , fixed inferobasal defect consistent with apical thinning and a prior inferobasal infarct; no ischemia.  TTE: 01/27/2015 - Left ventricle: The cavity size was normal. Wall thickness was  increased in a pattern of mild LVH. Systolic function was normal.  The estimated ejection fraction was in the range of 55% to 60%.  Wall motion was normal; there were no regional wall motion  abnormalities. Doppler parameters are consistent with abnormal  left ventricular relaxation (grade 1 diastolic dysfunction). - Aortic valve: There was no stenosis. - Mitral valve: Mildly calcified annulus. Mildly calcified leaflets  . There was no significant regurgitation. - Right ventricle: Poorly visualized. The cavity size was normal.  Systolic function was normal. - Pulmonary arteries: No complete TR doppler jet so unable to  estimate PA systolic pressure. - Inferior vena cava: The vessel was normal in size. The  respirophasic diameter changes were in the normal range (>= 50%),  consistent with normal  central venous pressure.    Assessment/Plan:  1. 2. AV block - Mobitz type 1 - evaluated by Dr. Rayann Heman and pacemaker placement suggested however patient wants to continue conservative management. He is advised to inform us if his symptoms of fatigue and dizziness gets worse. His most recent 2 episodes of dizziness seem to be related to positional vertigo and he hasn't had any episodes in the last 4 weeks. For now we will just monitor.  2. CAD - s/p CABG - he is asymptomatic, no ischemic workup is necessary at this point. Continue aspirin, Plavix, atorvastatin, lisinopril, no beta blocker secondary to second-degree AV block.  3. Chronic diastolic CHF - patient is using 40 mg of Lasix daily, he's advised to take an actual laces on days when he gains over 3 pounds lower extremity edema is worsening.  4. HTN - well controlled.   5. DM  6. CKD stage III- followed by nephrology  Follow up in 6 months.  SignedDorothy Spark 05/28/2015 1:46 PM  [facility] [address] [place], [place]  [address] Phone: [phone] Fax: [phone]

## 2015-05-31 DIAGNOSIS — J449 Chronic obstructive pulmonary disease, unspecified: Secondary | ICD-10-CM | POA: Diagnosis not present

## 2015-05-31 DIAGNOSIS — R262 Difficulty in walking, not elsewhere classified: Secondary | ICD-10-CM | POA: Diagnosis not present

## 2015-05-31 DIAGNOSIS — E1151 Type 2 diabetes mellitus with diabetic peripheral angiopathy without gangrene: Secondary | ICD-10-CM | POA: Diagnosis not present

## 2015-05-31 DIAGNOSIS — M1990 Unspecified osteoarthritis, unspecified site: Secondary | ICD-10-CM | POA: Diagnosis not present

## 2015-05-31 DIAGNOSIS — M6289 Other specified disorders of muscle: Secondary | ICD-10-CM | POA: Diagnosis not present

## 2015-05-31 DIAGNOSIS — I10 Essential (primary) hypertension: Secondary | ICD-10-CM | POA: Diagnosis not present

## 2015-06-07 DIAGNOSIS — M1990 Unspecified osteoarthritis, unspecified site: Secondary | ICD-10-CM | POA: Diagnosis not present

## 2015-06-07 DIAGNOSIS — R262 Difficulty in walking, not elsewhere classified: Secondary | ICD-10-CM | POA: Diagnosis not present

## 2015-06-07 DIAGNOSIS — J449 Chronic obstructive pulmonary disease, unspecified: Secondary | ICD-10-CM | POA: Diagnosis not present

## 2015-06-07 DIAGNOSIS — I10 Essential (primary) hypertension: Secondary | ICD-10-CM | POA: Diagnosis not present

## 2015-06-07 DIAGNOSIS — E1151 Type 2 diabetes mellitus with diabetic peripheral angiopathy without gangrene: Secondary | ICD-10-CM | POA: Diagnosis not present

## 2015-06-07 DIAGNOSIS — M6289 Other specified disorders of muscle: Secondary | ICD-10-CM | POA: Diagnosis not present

## 2015-06-08 ENCOUNTER — Other Ambulatory Visit: Payer: Self-pay | Admitting: Vascular Surgery

## 2015-06-08 ENCOUNTER — Other Ambulatory Visit: Payer: Self-pay | Admitting: Cardiology

## 2015-06-10 DIAGNOSIS — R262 Difficulty in walking, not elsewhere classified: Secondary | ICD-10-CM | POA: Diagnosis not present

## 2015-06-10 DIAGNOSIS — M6289 Other specified disorders of muscle: Secondary | ICD-10-CM | POA: Diagnosis not present

## 2015-06-10 DIAGNOSIS — E1151 Type 2 diabetes mellitus with diabetic peripheral angiopathy without gangrene: Secondary | ICD-10-CM | POA: Diagnosis not present

## 2015-06-10 DIAGNOSIS — J449 Chronic obstructive pulmonary disease, unspecified: Secondary | ICD-10-CM | POA: Diagnosis not present

## 2015-06-10 DIAGNOSIS — I10 Essential (primary) hypertension: Secondary | ICD-10-CM | POA: Diagnosis not present

## 2015-06-10 DIAGNOSIS — M1990 Unspecified osteoarthritis, unspecified site: Secondary | ICD-10-CM | POA: Diagnosis not present

## 2015-06-17 DIAGNOSIS — I10 Essential (primary) hypertension: Secondary | ICD-10-CM | POA: Diagnosis not present

## 2015-06-17 DIAGNOSIS — M1990 Unspecified osteoarthritis, unspecified site: Secondary | ICD-10-CM | POA: Diagnosis not present

## 2015-06-17 DIAGNOSIS — E1151 Type 2 diabetes mellitus with diabetic peripheral angiopathy without gangrene: Secondary | ICD-10-CM | POA: Diagnosis not present

## 2015-06-17 DIAGNOSIS — M6289 Other specified disorders of muscle: Secondary | ICD-10-CM | POA: Diagnosis not present

## 2015-06-17 DIAGNOSIS — J449 Chronic obstructive pulmonary disease, unspecified: Secondary | ICD-10-CM | POA: Diagnosis not present

## 2015-06-17 DIAGNOSIS — R262 Difficulty in walking, not elsewhere classified: Secondary | ICD-10-CM | POA: Diagnosis not present

## 2015-06-21 DIAGNOSIS — R262 Difficulty in walking, not elsewhere classified: Secondary | ICD-10-CM | POA: Diagnosis not present

## 2015-06-21 DIAGNOSIS — M1990 Unspecified osteoarthritis, unspecified site: Secondary | ICD-10-CM | POA: Diagnosis not present

## 2015-06-21 DIAGNOSIS — E1151 Type 2 diabetes mellitus with diabetic peripheral angiopathy without gangrene: Secondary | ICD-10-CM | POA: Diagnosis not present

## 2015-06-21 DIAGNOSIS — J449 Chronic obstructive pulmonary disease, unspecified: Secondary | ICD-10-CM | POA: Diagnosis not present

## 2015-06-21 DIAGNOSIS — M6289 Other specified disorders of muscle: Secondary | ICD-10-CM | POA: Diagnosis not present

## 2015-06-21 DIAGNOSIS — I10 Essential (primary) hypertension: Secondary | ICD-10-CM | POA: Diagnosis not present

## 2015-06-24 DIAGNOSIS — E1151 Type 2 diabetes mellitus with diabetic peripheral angiopathy without gangrene: Secondary | ICD-10-CM | POA: Diagnosis not present

## 2015-06-24 DIAGNOSIS — J449 Chronic obstructive pulmonary disease, unspecified: Secondary | ICD-10-CM | POA: Diagnosis not present

## 2015-06-24 DIAGNOSIS — M1990 Unspecified osteoarthritis, unspecified site: Secondary | ICD-10-CM | POA: Diagnosis not present

## 2015-06-24 DIAGNOSIS — M6289 Other specified disorders of muscle: Secondary | ICD-10-CM | POA: Diagnosis not present

## 2015-06-24 DIAGNOSIS — R262 Difficulty in walking, not elsewhere classified: Secondary | ICD-10-CM | POA: Diagnosis not present

## 2015-06-24 DIAGNOSIS — I10 Essential (primary) hypertension: Secondary | ICD-10-CM | POA: Diagnosis not present

## 2015-06-28 DIAGNOSIS — I10 Essential (primary) hypertension: Secondary | ICD-10-CM | POA: Diagnosis not present

## 2015-06-28 DIAGNOSIS — M6289 Other specified disorders of muscle: Secondary | ICD-10-CM | POA: Diagnosis not present

## 2015-06-28 DIAGNOSIS — J449 Chronic obstructive pulmonary disease, unspecified: Secondary | ICD-10-CM | POA: Diagnosis not present

## 2015-06-28 DIAGNOSIS — E1151 Type 2 diabetes mellitus with diabetic peripheral angiopathy without gangrene: Secondary | ICD-10-CM | POA: Diagnosis not present

## 2015-06-28 DIAGNOSIS — R262 Difficulty in walking, not elsewhere classified: Secondary | ICD-10-CM | POA: Diagnosis not present

## 2015-06-28 DIAGNOSIS — M1990 Unspecified osteoarthritis, unspecified site: Secondary | ICD-10-CM | POA: Diagnosis not present

## 2015-07-01 DIAGNOSIS — M1990 Unspecified osteoarthritis, unspecified site: Secondary | ICD-10-CM | POA: Diagnosis not present

## 2015-07-01 DIAGNOSIS — I10 Essential (primary) hypertension: Secondary | ICD-10-CM | POA: Diagnosis not present

## 2015-07-01 DIAGNOSIS — R262 Difficulty in walking, not elsewhere classified: Secondary | ICD-10-CM | POA: Diagnosis not present

## 2015-07-01 DIAGNOSIS — E1151 Type 2 diabetes mellitus with diabetic peripheral angiopathy without gangrene: Secondary | ICD-10-CM | POA: Diagnosis not present

## 2015-07-01 DIAGNOSIS — J449 Chronic obstructive pulmonary disease, unspecified: Secondary | ICD-10-CM | POA: Diagnosis not present

## 2015-07-01 DIAGNOSIS — M6289 Other specified disorders of muscle: Secondary | ICD-10-CM | POA: Diagnosis not present

## 2015-07-05 DIAGNOSIS — R262 Difficulty in walking, not elsewhere classified: Secondary | ICD-10-CM | POA: Diagnosis not present

## 2015-07-05 DIAGNOSIS — I10 Essential (primary) hypertension: Secondary | ICD-10-CM | POA: Diagnosis not present

## 2015-07-05 DIAGNOSIS — E1151 Type 2 diabetes mellitus with diabetic peripheral angiopathy without gangrene: Secondary | ICD-10-CM | POA: Diagnosis not present

## 2015-07-05 DIAGNOSIS — M1990 Unspecified osteoarthritis, unspecified site: Secondary | ICD-10-CM | POA: Diagnosis not present

## 2015-07-05 DIAGNOSIS — J449 Chronic obstructive pulmonary disease, unspecified: Secondary | ICD-10-CM | POA: Diagnosis not present

## 2015-07-05 DIAGNOSIS — M6289 Other specified disorders of muscle: Secondary | ICD-10-CM | POA: Diagnosis not present

## 2015-07-07 DIAGNOSIS — J449 Chronic obstructive pulmonary disease, unspecified: Secondary | ICD-10-CM | POA: Diagnosis not present

## 2015-07-07 DIAGNOSIS — M6289 Other specified disorders of muscle: Secondary | ICD-10-CM | POA: Diagnosis not present

## 2015-07-07 DIAGNOSIS — I10 Essential (primary) hypertension: Secondary | ICD-10-CM | POA: Diagnosis not present

## 2015-07-07 DIAGNOSIS — R262 Difficulty in walking, not elsewhere classified: Secondary | ICD-10-CM | POA: Diagnosis not present

## 2015-07-07 DIAGNOSIS — E1151 Type 2 diabetes mellitus with diabetic peripheral angiopathy without gangrene: Secondary | ICD-10-CM | POA: Diagnosis not present

## 2015-07-07 DIAGNOSIS — M1990 Unspecified osteoarthritis, unspecified site: Secondary | ICD-10-CM | POA: Diagnosis not present

## 2015-09-08 DIAGNOSIS — E119 Type 2 diabetes mellitus without complications: Secondary | ICD-10-CM | POA: Diagnosis not present

## 2015-09-08 DIAGNOSIS — I10 Essential (primary) hypertension: Secondary | ICD-10-CM | POA: Diagnosis not present

## 2015-09-13 ENCOUNTER — Encounter: Payer: Self-pay | Admitting: Cardiology

## 2015-09-15 DIAGNOSIS — N183 Chronic kidney disease, stage 3 (moderate): Secondary | ICD-10-CM | POA: Diagnosis not present

## 2015-09-15 DIAGNOSIS — I10 Essential (primary) hypertension: Secondary | ICD-10-CM | POA: Diagnosis not present

## 2015-09-15 DIAGNOSIS — E119 Type 2 diabetes mellitus without complications: Secondary | ICD-10-CM | POA: Diagnosis not present

## 2015-09-15 DIAGNOSIS — E7800 Pure hypercholesterolemia, unspecified: Secondary | ICD-10-CM | POA: Diagnosis not present

## 2015-09-21 DIAGNOSIS — E1151 Type 2 diabetes mellitus with diabetic peripheral angiopathy without gangrene: Secondary | ICD-10-CM | POA: Diagnosis not present

## 2015-09-21 DIAGNOSIS — M216X2 Other acquired deformities of left foot: Secondary | ICD-10-CM | POA: Diagnosis not present

## 2015-09-21 DIAGNOSIS — L602 Onychogryphosis: Secondary | ICD-10-CM | POA: Diagnosis not present

## 2015-09-21 DIAGNOSIS — M216X1 Other acquired deformities of right foot: Secondary | ICD-10-CM | POA: Diagnosis not present

## 2015-10-27 DIAGNOSIS — N183 Chronic kidney disease, stage 3 (moderate): Secondary | ICD-10-CM | POA: Diagnosis not present

## 2015-10-27 DIAGNOSIS — I714 Abdominal aortic aneurysm, without rupture: Secondary | ICD-10-CM | POA: Diagnosis not present

## 2015-10-27 DIAGNOSIS — I10 Essential (primary) hypertension: Secondary | ICD-10-CM | POA: Diagnosis not present

## 2015-10-27 DIAGNOSIS — E785 Hyperlipidemia, unspecified: Secondary | ICD-10-CM | POA: Diagnosis not present

## 2015-10-27 DIAGNOSIS — E1121 Type 2 diabetes mellitus with diabetic nephropathy: Secondary | ICD-10-CM | POA: Diagnosis not present

## 2015-10-27 DIAGNOSIS — D472 Monoclonal gammopathy: Secondary | ICD-10-CM | POA: Diagnosis not present

## 2015-10-27 DIAGNOSIS — I2510 Atherosclerotic heart disease of native coronary artery without angina pectoris: Secondary | ICD-10-CM | POA: Diagnosis not present

## 2015-10-27 DIAGNOSIS — D509 Iron deficiency anemia, unspecified: Secondary | ICD-10-CM | POA: Diagnosis not present

## 2015-10-27 DIAGNOSIS — E669 Obesity, unspecified: Secondary | ICD-10-CM | POA: Diagnosis not present

## 2015-10-27 DIAGNOSIS — I739 Peripheral vascular disease, unspecified: Secondary | ICD-10-CM | POA: Diagnosis not present

## 2015-10-27 DIAGNOSIS — I639 Cerebral infarction, unspecified: Secondary | ICD-10-CM | POA: Diagnosis not present

## 2015-10-27 DIAGNOSIS — N2581 Secondary hyperparathyroidism of renal origin: Secondary | ICD-10-CM | POA: Diagnosis not present

## 2015-11-02 ENCOUNTER — Other Ambulatory Visit: Payer: Self-pay

## 2015-11-24 ENCOUNTER — Encounter: Payer: Self-pay | Admitting: Cardiology

## 2015-12-09 ENCOUNTER — Encounter (INDEPENDENT_AMBULATORY_CARE_PROVIDER_SITE_OTHER): Payer: Self-pay

## 2015-12-09 ENCOUNTER — Ambulatory Visit (INDEPENDENT_AMBULATORY_CARE_PROVIDER_SITE_OTHER): Payer: Medicare Other | Admitting: Cardiology

## 2015-12-09 ENCOUNTER — Encounter: Payer: Self-pay | Admitting: Cardiology

## 2015-12-09 VITALS — BP 132/74 | HR 68 | Ht 70.0 in | Wt 217.0 lb

## 2015-12-09 DIAGNOSIS — I10 Essential (primary) hypertension: Secondary | ICD-10-CM | POA: Diagnosis not present

## 2015-12-09 DIAGNOSIS — Z951 Presence of aortocoronary bypass graft: Secondary | ICD-10-CM | POA: Diagnosis not present

## 2015-12-09 DIAGNOSIS — I441 Atrioventricular block, second degree: Secondary | ICD-10-CM

## 2015-12-09 DIAGNOSIS — I2510 Atherosclerotic heart disease of native coronary artery without angina pectoris: Secondary | ICD-10-CM

## 2015-12-09 DIAGNOSIS — N183 Chronic kidney disease, stage 3 unspecified: Secondary | ICD-10-CM

## 2015-12-09 DIAGNOSIS — R42 Dizziness and giddiness: Secondary | ICD-10-CM

## 2015-12-09 NOTE — Progress Notes (Signed)
Patient ID: [name] [name], male   DOB: 02/08/1932, 82 y.o.   MRN: [id number]    CARDIOLOGY OFFICE NOTE  Date:  12/09/2015   [name] [name] Date of Birth: [birth year] Medical Record [id number]  PCP:  [name] Gravel, MD  Cardiologist:  Former patient of Dr. Kae [name], now transferred to me [name] [name])    Chief complain: dizziness, balance problems  History of Present Illness: [name] [name] is a 82 y.o. male with history of hypertension, CK D stage III, AAA repair, prior CVA, PVD and CAD with remote CABG by Dr. Roxan Hockey. His former patient of Dr. Ron [name] last seen in August 2016 and in between was seen by [name] [name]. Patient denies any chest pain he has stable shortness of breath. He was diagnosed with significant bradycardia and second-degree AV block type I and seen by Dr. Rayann [name] who suggested a pacemaker placement. After hearing all the possible complications patient decided to proceed with conservative management for now. He has on and off lower extremity edema, no orthopnea no paroxysmal nocturnal dyspnea. His major problem was balance issue and dizziness mostly when he changes position in bed and he feels like room is spinning around him. He has been working with a physical therapist that comes twice a week to his house and his symptoms have improved. He denies any palpitations or syncope.   12/09/2015 - the patient is coming after 6 months, he states that he has been feeling progressively more tired and weaker over the last 6 months. He can walk minimally at home and denies any dizziness or presyncope or syncope. He fell recently at dividing or he tripped over a chair. He denies any lower extremity edema orthopnea or proximal nocturnal dyspnea. His wife checks his blood pressure at home and it usually runs anywhere between 140s to 160. She also states that his heart rate can be as low as in 40s.  Past Medical History:  Diagnosis Date  . AAA (abdominal aortic aneurysm) [facility])    Surgical  repair June, 2014  . CAD (coronary artery disease)   . Carotid artery occlusion   . Chronic renal insufficiency   . COPD (chronic obstructive pulmonary disease) ([place]) 08/15/2012  . CVA (cerebral vascular accident) ([name] [name])    residual R leg weakness  . Diabetes mellitus without complication ([name] [name])   . Dyslipidemia   . Fall at home Feb.4, 2015   Fx Right Hip  . HTN (hypertension)   . Hx of CABG    2005  . Leg pain   . Morbid obesity ([place])   . Neuropathy ([name] [name])    Right foot  . Peripheral vascular disease ([name] [name])   . Renal artery stenosis ([name] [name])   . Status post abdominal aortic aneurysm repair    June, 2014  . Tobacco abuse    quit 2014    Past Surgical History:  Procedure Laterality Date  . ABDOMINAL AORTAGRAM N/A 08/07/2012   Procedure: ABDOMINAL AORTAGRAM;  Surgeon: [name] Posner, MD;  Location: [facility] CATH LAB;  Service: Cardiovascular;  Laterality: N/A;  . AORTA - BILATERAL FEMORAL ARTERY BYPASS GRAFT N/A 08/09/2012   Procedure: AORTA BIFEMORAL BYPASS GRAFT;  Surgeon: [name] Posner, MD;  Location: [place];  Service: Vascular;  Laterality: N/A;  . CORONARY ARTERY BYPASS GRAFT  2005   x4  . HIP ARTHROPLASTY Right 04/09/2013   Procedure: ARTHROPLASTY BIPOLAR HIP;  Surgeon: [name] Alf, MD;  [facility];  Service: Orthopedics;  Laterality: Right;  . JOINT REPLACEMENT  Right Feb. 4, 2015   Hip- Pt fell  . SPINE SURGERY  1977   Lumbar HNP surgery   Medications: Current Outpatient Prescriptions  Medication Sig Dispense Refill  . aspirin 81 MG tablet Take 81 mg by mouth daily.    [name] [name] atorvastatin (LIPITOR) 40 MG tablet Take 40 mg by mouth daily.     . cholecalciferol (VITAMIN D) 1000 UNITS tablet Take 1,000 Units by mouth daily.     . clopidogrel (PLAVIX) 75 MG tablet Take 1 tablet by mouth daily.    [name] [name] escitalopram (LEXAPRO) 5 MG tablet Take 5 mg by mouth daily.     . furosemide (LASIX) 20 MG tablet Take 10 mg by mouth daily.    [name] [name] gabapentin (NEURONTIN) 300 MG capsule TAKE (1) CAPSULE  DAILY. 60 capsule 3  . glimepiride (AMARYL) 1 MG tablet Take 1 mg by mouth daily with breakfast.    . lisinopril (PRINIVIL,ZESTRIL) 10 MG tablet TAKE 1 TABLET ONCE DAILY. 90 tablet 3  . oxybutynin (DITROPAN-XL) 5 MG 24 hr tablet Take 5 mg by mouth daily.     . pioglitazone (ACTOS) 15 MG tablet Take 15 mg by mouth daily.    [name] [name] PROAIR HFA 108 (90 BASE) MCG/ACT inhaler Inhale 1 puff into the lungs every 4 (four) hours as needed.     . saxagliptin HCl (ONGLYZA) 5 MG TABS tablet Take 2.5 mg by mouth daily.     . traMADol (ULTRAM) 50 MG tablet Take 1 tablet by mouth every 6 (six) hours as needed for moderate pain.     . vitamin B-12 (CYANOCOBALAMIN) 100 MCG tablet Take 100 mcg by mouth daily.     [name] [name] zolpidem (AMBIEN) 10 MG tablet Take 5 mg by mouth at bedtime as needed for sleep.      No current facility-administered medications for this visit.    Allergies: No Known Allergies  Social History: The patient  reports that he quit smoking about 3 years ago. His smoking use included Cigarettes. He has a 25.00 pack-year smoking history. He has never used smokeless tobacco. He reports that he drinks alcohol. He reports that he does not use drugs.   Family History: The patient's family history includes Coronary artery disease in his father; Tuberculosis in his mother; Varicose Veins in his father.   Review of Systems: Please see the history of present illness.   Otherwise, the review of systems is positive for none.   All other systems are reviewed and negative.   Physical Exam: VS:  BP 132/74   Pulse 68   Ht 5\' 10"  (1.778 m)   Wt 217 lb (98.4 kg)   BMI 31.14 kg/m  .  BMI Body mass index is 31.14 kg/m.  Wt Readings from Last 3 Encounters:  12/09/15 217 lb (98.4 kg)  05/28/15 224 lb (101.6 kg)  02/10/15 228 lb 3.2 oz (103.5 kg)   General: Pleasant. Chronically ill appearing but in no acute distress.  HEENT: Normal. Neck: Supple, no JVD, carotid bruits, or masses noted.  Cardiac: Irregular  rhythm. Rate a little slow. Trace edema.  Respiratory:  Lungs are fairly clear to auscultation bilaterally with normal work of breathing.  GI: Soft and nontender.  MS: No deformity or atrophy. Gait not tested - he is in a wheelchair. Skin: Warm and dry. Color is normal.  Neuro:  Strength and sensation are intact and no gross focal deficits noted.  Psych: Alert, appropriate and with normal affect.   LABORATORY DATA:  EKG:  EKG is ordered today. This demonstrates 2nd degree AV block - type I - Wenkebach - his rate is 53.  Lab Results  Component Value Date   WBC 11.5 (H) 01/19/2015   HGB 12.7 (L) 01/19/2015   HCT 39.3 01/19/2015   PLT 239 01/19/2015   GLUCOSE 96 01/19/2015   ALT 21 08/19/2012   AST 31 08/19/2012   NA 140 01/19/2015   K 4.2 01/19/2015   CL 104 01/19/2015   CREATININE 1.77 (H) 01/19/2015   BUN 22 01/19/2015   CO2 25 01/19/2015   TSH 1.989 01/19/2015   INR 0.98 04/08/2013   HGBA1C 6.1 (H) 08/10/2012   Other Studies Reviewed Today:  Myoview Impression from 08/2011 Exercise Capacity: Lexiscan with no exercise. BP Response: Normal blood pressure response. Clinical Symptoms: No chest pain or dyspnea. ECG Impression: No significant ST segment change suggestive of ischemia. Comparison with Prior Nuclear Study: No previous nuclear study performed.  Overall Impression: Abnormal stress nuclear study with a mild, small, fixed apical defect and a medium, moderate size , fixed inferobasal defect consistent with apical thinning and a prior inferobasal infarct; no ischemia.  TTE: 01/27/2015 - Left ventricle: The cavity size was normal. Wall thickness was  increased in a pattern of mild LVH. Systolic function was normal.  The estimated ejection fraction was in the range of 55% to 60%.  Wall motion was normal; there were no regional wall motion  abnormalities. Doppler parameters are consistent with abnormal  left ventricular relaxation (grade 1 diastolic  dysfunction). - Aortic valve: There was no stenosis. - Mitral valve: Mildly calcified annulus. Mildly calcified leaflets  . There was no significant regurgitation. - Right ventricle: Poorly visualized. The cavity size was normal.  Systolic function was normal. - Pulmonary arteries: No complete TR doppler jet so unable to  estimate PA systolic pressure. - Inferior vena cava: The vessel was normal in size. The  respirophasic diameter changes were in the normal range (>= 50%),  consistent with normal central venous pressure.    Assessment/Plan:  1. 2. AV block - Mobitz type 1 - evaluated by Dr. Rayann [name] and pacemaker placement suggested however patient wants to continue conservative management. He is advised to inform us if his symptoms of fatigue and dizziness gets worse, it seems that his symptoms get significantly worse, he again states that he would prefer not to get a pacemaker is his there file the procedure however his wife disagrees. We'll start a 48 hour Holter monitor to evaluate for any significant bradycardia AV blocks or pauses. We will rediscuss after we have results. His EKG today shows first-degree AV block, previously second-degree AV block Mobitz 1.  2. CAD - s/p CABG - he is asymptomatic, no ischemic workup is necessary at this point. Continue aspirin, Plavix, atorvastatin, lisinopril, no beta blocker secondary to second-degree AV block.  3. Chronic diastolic CHF - patient is using 40 mg of Lasix daily, he's advised to take an actual laces on days when he gains over 3 pounds lower extremity edema is worsening.  4. HTN - well controlled.   5. DM  6. CKD stage III- followed by nephrology  Follow up in 3 months.  Signed: Ena [name] 12/09/2015 11:08 AM  [facility] [address] [place], [place]  [address] Phone: [phone] Fax: [phone]

## 2015-12-09 NOTE — Patient Instructions (Signed)
Medication Instructions:  Your physician recommends that you continue on your current medications as directed. Please refer to the Current Medication list given to you today.  Labwork: NONE  Testing/Procedures: Your physician has recommended that you wear a holter monitor 48 hour. Holter monitors are medical devices that record the heart's electrical activity. Doctors most often use these monitors to diagnose arrhythmias. Arrhythmias are problems with the speed or rhythm of the heartbeat. The monitor is a small, portable device. You can wear one while you do your normal daily activities. This is usually used to diagnose what is causing palpitations/syncope (passing out).    Follow-Up: Your physician wants you to follow-up in: 3 months with Dr. Meda Coffee.   If you need a refill on your cardiac medications before your next appointment, please call your pharmacy.

## 2015-12-20 ENCOUNTER — Ambulatory Visit (INDEPENDENT_AMBULATORY_CARE_PROVIDER_SITE_OTHER): Payer: Medicare Other

## 2015-12-20 DIAGNOSIS — I2510 Atherosclerotic heart disease of native coronary artery without angina pectoris: Secondary | ICD-10-CM | POA: Diagnosis not present

## 2015-12-20 DIAGNOSIS — I441 Atrioventricular block, second degree: Secondary | ICD-10-CM | POA: Diagnosis not present

## 2015-12-20 DIAGNOSIS — R42 Dizziness and giddiness: Secondary | ICD-10-CM

## 2015-12-30 ENCOUNTER — Telehealth: Payer: Self-pay | Admitting: Cardiology

## 2015-12-30 NOTE — Telephone Encounter (Signed)
I have reviewed 48 hour Holter monitor for this patient [name] [name]. His Holter monitor shows second-degree AV block Wenckebach type but also frequent 2:1 block with heart rate down to 33 bpm during awake hours and down to 25 bpm at night. The longest R to R interval is 2.6 seconds. The patient was offered pacemaker in the past and was seen by Dr. Rayann Heman however he refused as he didn't want to stay overnight in the hospital and also was scared of potential complication of this procedure. I have talked to the patient today and he states that he is completely asymptomatic, he is minimally active with sedentary lifestyle and denies any dizziness no falls. He wanted me to speak to his wife who is very concerned but also confirms that he is asymptomatic. I have explained my concerns that this degree of AV block usually progresses to third-degree AV block and can lead to syncope or potential death, they understand but want to discuss between themselves and with her children. They will call us in case they changed her mind, I will have minor [name] called them on Monday to ask about their decision, however again I reinforced that I strongly believe he should get pacemaker placement.  Ena Dawley, MD 12/30/2015

## 2016-01-03 NOTE — Telephone Encounter (Signed)
Notes Recorded by Nuala Alpha, LPN on [id number] at 10:21 AM EDT Spoke with the pt and went over Dr Francesca Oman recommendations for him to have pacemaker placement, for abnormal 48 hour holter monitor, showing second-degree AV block Wenckebach, with HR down as low as 33 bpm.  Per the pt, he talked this over with his family this weekend, and would prefer to come into the office to see Dr Rayann Heman, at his next available, to discuss this more in depth and to outweigh the pros and cons.  Pt states he's very fearful about spending the night in the hospital, for in the past, he obtained a Nosocomial infection from a hospital stay.  Pt states he would also like to discuss that with Dr Rayann Heman as well.  Offered the pt an appt for this Wednesday with Dr Rayann Heman at 2:45 pm, per Dr Jackalyn Lombard EP scheduler.  Pt states that this Wednesday 11/1 appt with Allred at 2:45 pm would work for his schedule. Pt states he would feel much better discussing this more in depth with him, and base his decision thereafter.  Informed the pt that I will update Dr Meda Coffee of this, and route this result to Dr Rayann Heman and RN, to have for their reference.  Pt verbalized understanding and agrees with this plan. Pt gracious for all the follow-up provided by multiple parties in our office.

## 2016-01-05 ENCOUNTER — Encounter: Payer: Self-pay | Admitting: Internal Medicine

## 2016-01-05 ENCOUNTER — Ambulatory Visit (INDEPENDENT_AMBULATORY_CARE_PROVIDER_SITE_OTHER): Payer: Medicare Other | Admitting: Internal Medicine

## 2016-01-05 ENCOUNTER — Encounter (INDEPENDENT_AMBULATORY_CARE_PROVIDER_SITE_OTHER): Payer: Self-pay

## 2016-01-05 VITALS — BP 106/58 | HR 51 | Ht 70.0 in | Wt 217.4 lb

## 2016-01-05 DIAGNOSIS — I10 Essential (primary) hypertension: Secondary | ICD-10-CM

## 2016-01-05 DIAGNOSIS — I2510 Atherosclerotic heart disease of native coronary artery without angina pectoris: Secondary | ICD-10-CM | POA: Diagnosis not present

## 2016-01-05 DIAGNOSIS — I441 Atrioventricular block, second degree: Secondary | ICD-10-CM | POA: Diagnosis not present

## 2016-01-05 DIAGNOSIS — Z951 Presence of aortocoronary bypass graft: Secondary | ICD-10-CM

## 2016-01-05 NOTE — Patient Instructions (Addendum)
Medication Instructions:  Your physician recommends that you continue on your current medications as directed. Please refer to the Current Medication list given to you today.  Labwork: None ordered.  Testing/Procedures: None ordered.  Follow-Up: Your physician wants you to follow-up in: 3 months with Dr. Allred.      Any Other Special Instructions Will Be Listed Below (If Applicable).  If you need a refill on your cardiac medications before your next appointment, please call your pharmacy.   

## 2016-01-05 NOTE — Progress Notes (Signed)
Electrophysiology Office Note   Date:  01/05/2016   ID:  MATEI COOMER, DOB 04-25-1931, MRN [id number]  PCP:  Jani Gravel, MD  Cardiologist:  Dr Meda Coffee (previously Dr Ron Parker) Primary Electrophysiologist: Thompson Grayer, MD    Chief Complaint  Patient presents with  . Follow-up    2nd degree AV block     History of Present Illness: [name] [name] is a 82 y.o. male who presents today for electrophysiology evaluation.   He presents today for evaluation of second degree AV block.  He was last seem by me for the same on 12//09/2014.  At that time, he was minimally symptomatic and declined pacing.   He recently was seen by Dr Meda Coffee.  Holter monitoring was again performed and revealed mobitz I second degree AV block.  Rare 2:1 AV block with V rates in the upper 20s/ lower 30s was noted.  Though most bradycardia was nocturnal, there were some episodes during waking hours.  He is unaware of associated symptoms.  NSVT x 1 was also noted without symptoms. He is not very active chronically.  His wife notes that he mostly sits in his chair. He denies fatigue and thinks that his energy is ok.  He has rare dizziness. He denies syncope or near syncope.  Overall, he feels that his symptoms are no different that when I saw him last.  He is clear that he wishes to avoid pacing.  Today, he denies symptoms of palpitations, chest pain, shortness of breath (above baseline), orthopnea, PND,  bleeding, or neurologic sequela.  He has chronic edema.  He has chronic renal failure and is followed by nephrology.  The patient is tolerating medications without difficulties and is otherwise without complaint today.    Past Medical History:  Diagnosis Date  . AAA (abdominal aortic aneurysm) [facility])    Surgical repair June, 2014  . CAD (coronary artery disease)   . Carotid artery occlusion   . Chronic renal insufficiency   . COPD (chronic obstructive pulmonary disease) ([place]) 08/15/2012  . CVA (cerebral vascular accident)  ([place])    residual R leg weakness  . Diabetes mellitus without complication ([place])   . Dyslipidemia   . Fall at home Feb.4, 2015   Fx Right Hip  . HTN (hypertension)   . Hx of CABG    2005  . Leg pain   . Morbid obesity ([place])   . Neuropathy ([place])    Right foot  . Peripheral vascular disease ([place])   . Renal artery stenosis ([place])   . Status post abdominal aortic aneurysm repair    June, 2014  . Tobacco abuse    quit 2014   Past Surgical History:  Procedure Laterality Date  . ABDOMINAL AORTAGRAM N/A 08/07/2012   Procedure: ABDOMINAL AORTAGRAM;  Surgeon: Rosetta Posner, MD;  Location: [facility] CATH LAB;  Service: Cardiovascular;  Laterality: N/A;  . AORTA - BILATERAL FEMORAL ARTERY BYPASS GRAFT N/A 08/09/2012   Procedure: AORTA BIFEMORAL BYPASS GRAFT;  Surgeon: Rosetta Posner, MD;  Location: [facility];  Service: Vascular;  Laterality: N/A;  . CORONARY ARTERY BYPASS GRAFT  2005   x4  . HIP ARTHROPLASTY Right 04/09/2013   Procedure: ARTHROPLASTY BIPOLAR HIP;  Surgeon: Gearlean Alf, MD;  Location: [facility] ORS;  Service: Orthopedics;  Laterality: Right;  . JOINT REPLACEMENT Right Feb. 4, 2015   Hip- Pt fell  . SPINE SURGERY  1977   Lumbar HNP surgery     Current Outpatient Prescriptions  Medication Sig Dispense Refill  . aspirin 81 MG tablet Take 81 mg by mouth daily.    Marland Kitchen atorvastatin (LIPITOR) 40 MG tablet Take 40 mg by mouth daily.     . cholecalciferol (VITAMIN D) 1000 UNITS tablet Take 1,000 Units by mouth daily.     . clopidogrel (PLAVIX) 75 MG tablet Take 1 tablet by mouth daily.    Marland Kitchen escitalopram (LEXAPRO) 5 MG tablet Take 5 mg by mouth daily.     . furosemide (LASIX) 20 MG tablet Take 10 mg by mouth daily.    Marland Kitchen gabapentin (NEURONTIN) 300 MG capsule Take 300 mg by mouth daily.    Marland Kitchen lisinopril (PRINIVIL,ZESTRIL) 10 MG tablet Take 10 mg by mouth daily.    Marland Kitchen oxybutynin (DITROPAN-XL) 5 MG 24 hr tablet Take 5 mg by mouth daily.     Marland Kitchen PROAIR HFA 108 (90 BASE) MCG/ACT inhaler Inhale 1 puff into the  lungs every 4 (four) hours as needed for wheezing or shortness of breath.     . saxagliptin HCl (ONGLYZA) 5 MG TABS tablet Take 2.5 mg by mouth daily.     . traMADol (ULTRAM) 50 MG tablet Take 1 tablet by mouth every 6 (six) hours as needed for moderate pain.     . vitamin B-12 (CYANOCOBALAMIN) 100 MCG tablet Take 100 mcg by mouth daily.     Marland Kitchen zolpidem (AMBIEN) 10 MG tablet Take 5 mg by mouth at bedtime as needed for sleep.      No current facility-administered medications for this visit.     Allergies:   Review of patient's allergies indicates no known allergies.   Social History:  The patient  reports that he quit smoking about 3 years ago. His smoking use included Cigarettes. He has a 25.00 pack-year smoking history. He has never used smokeless tobacco. He reports that he drinks alcohol. He reports that he does not use drugs.   Family History:  The patient's  family history includes Coronary artery disease in his father; Tuberculosis in his mother; Varicose Veins in his father.    ROS:  Please see the history of present illness.   All other systems are reviewed and negative.    PHYSICAL EXAM: VS:  BP (!) 106/58   Pulse (!) 51   Ht 5\' 10"  (1.778 m)   Wt 217 lb 6.4 oz (98.6 kg)   BMI 31.19 kg/m  , BMI Body mass index is 31.19 kg/m. GEN: overweight and elderly, in no acute distress  HEENT: normal  Neck: no JVD, carotid bruits, or masses Cardiac: RRR; no murmurs, rubs, or gallops,no edema  Respiratory:  clear to auscultation bilaterally, normal work of breathing GI: soft, nontender, nondistended, + BS MS: no deformity or atrophy  Skin: warm and dry  Neuro:    in a wheelchair today Psych: euthymic mood, full affect  Recent holter monitor is reviewed  Recent Labs: 01/19/2015: BUN 22; Creat 1.77; Hemoglobin 12.7; Platelets 239; Potassium 4.2; Sodium 140; TSH 1.989   Lipid Panel  No results found for: CHOL, TRIG, HDL, CHOLHDL, VLDL, LDLCALC, LDLDIRECT   Wt Readings from Last  3 Encounters:  01/05/16 217 lb 6.4 oz (98.6 kg)  12/09/15 217 lb (98.4 kg)  05/28/15 224 lb (101.6 kg)     Other studies Reviewed: Additional studies/ records that were reviewed today include: holter monitor and Dr Thera Flake notes are reviewed  ASSESSMENT AND PLAN:  1. Mobitz I second degree AV block The patient has daytime second degree AV  block demonstrated.  Today, we discussed this at length.  Though he may have some symptoms of bradycardia, he has multiple chronic conditions that likely contribute to his overall debility.  Risks, benefits, alternatives to pacemaker implantation were discussed in detail with the patient today.  At this time, he remains very clear that he does not wish to have PPM implanted.  He is aware that should have had worsening of his bradycardia that he is at risk for syncope and even sudden death.  He accepts these risks and wishes for a conservative approach.  Given his advanced age and comorbidities, I think that this is quite reasonable. I will have him return in 3 months for further discussion. He is aware that should he have worsening symptoms that he should reconsider PPM at that time.  2. Obesity Body mass index is 31.19 kg/m. Weight reduction advised  3. HTn Stable No change required today  4. CAD No ischemic symptoms  Return to see me in 3 months  Signed, Thompson Grayer, MD  01/05/2016   [address] [place] [address] [phone] (office) [phone] (fax)

## 2016-01-14 ENCOUNTER — Encounter: Payer: Self-pay | Admitting: Cardiology

## 2016-01-14 DIAGNOSIS — D509 Iron deficiency anemia, unspecified: Secondary | ICD-10-CM | POA: Diagnosis not present

## 2016-01-14 DIAGNOSIS — N2581 Secondary hyperparathyroidism of renal origin: Secondary | ICD-10-CM | POA: Diagnosis not present

## 2016-01-14 DIAGNOSIS — N183 Chronic kidney disease, stage 3 (moderate): Secondary | ICD-10-CM | POA: Diagnosis not present

## 2016-01-14 DIAGNOSIS — D472 Monoclonal gammopathy: Secondary | ICD-10-CM | POA: Diagnosis not present

## 2016-01-17 DIAGNOSIS — N183 Chronic kidney disease, stage 3 (moderate): Secondary | ICD-10-CM | POA: Diagnosis not present

## 2016-01-17 DIAGNOSIS — D472 Monoclonal gammopathy: Secondary | ICD-10-CM | POA: Diagnosis not present

## 2016-01-31 DIAGNOSIS — E785 Hyperlipidemia, unspecified: Secondary | ICD-10-CM | POA: Diagnosis not present

## 2016-01-31 DIAGNOSIS — Z23 Encounter for immunization: Secondary | ICD-10-CM | POA: Diagnosis not present

## 2016-01-31 DIAGNOSIS — I10 Essential (primary) hypertension: Secondary | ICD-10-CM | POA: Diagnosis not present

## 2016-01-31 DIAGNOSIS — E1121 Type 2 diabetes mellitus with diabetic nephropathy: Secondary | ICD-10-CM | POA: Diagnosis not present

## 2016-01-31 DIAGNOSIS — N183 Chronic kidney disease, stage 3 (moderate): Secondary | ICD-10-CM | POA: Diagnosis not present

## 2016-01-31 DIAGNOSIS — N2581 Secondary hyperparathyroidism of renal origin: Secondary | ICD-10-CM | POA: Diagnosis not present

## 2016-01-31 DIAGNOSIS — I639 Cerebral infarction, unspecified: Secondary | ICD-10-CM | POA: Diagnosis not present

## 2016-01-31 DIAGNOSIS — I714 Abdominal aortic aneurysm, without rupture: Secondary | ICD-10-CM | POA: Diagnosis not present

## 2016-01-31 DIAGNOSIS — D509 Iron deficiency anemia, unspecified: Secondary | ICD-10-CM | POA: Diagnosis not present

## 2016-01-31 DIAGNOSIS — I2510 Atherosclerotic heart disease of native coronary artery without angina pectoris: Secondary | ICD-10-CM | POA: Diagnosis not present

## 2016-01-31 DIAGNOSIS — I739 Peripheral vascular disease, unspecified: Secondary | ICD-10-CM | POA: Diagnosis not present

## 2016-01-31 DIAGNOSIS — D472 Monoclonal gammopathy: Secondary | ICD-10-CM | POA: Diagnosis not present

## 2016-03-10 ENCOUNTER — Encounter: Payer: Self-pay | Admitting: Cardiology

## 2016-03-10 ENCOUNTER — Ambulatory Visit (INDEPENDENT_AMBULATORY_CARE_PROVIDER_SITE_OTHER): Payer: Medicare Other | Admitting: Cardiology

## 2016-03-10 VITALS — BP 126/66 | HR 57 | Ht 70.0 in | Wt 223.0 lb

## 2016-03-10 DIAGNOSIS — R42 Dizziness and giddiness: Secondary | ICD-10-CM

## 2016-03-10 DIAGNOSIS — Z951 Presence of aortocoronary bypass graft: Secondary | ICD-10-CM

## 2016-03-10 DIAGNOSIS — N183 Chronic kidney disease, stage 3 unspecified: Secondary | ICD-10-CM

## 2016-03-10 DIAGNOSIS — I441 Atrioventricular block, second degree: Secondary | ICD-10-CM

## 2016-03-10 DIAGNOSIS — I10 Essential (primary) hypertension: Secondary | ICD-10-CM | POA: Diagnosis not present

## 2016-03-10 DIAGNOSIS — I2510 Atherosclerotic heart disease of native coronary artery without angina pectoris: Secondary | ICD-10-CM | POA: Diagnosis not present

## 2016-03-10 MED ORDER — LISINOPRIL 5 MG PO TABS: 5.0000 mg | ORAL_TABLET | Freq: Every day | ORAL | 3 refills | Status: DC

## 2016-03-10 NOTE — Progress Notes (Signed)
Patient ID: [name] [name], male   DOB: August 30, 1931, 83 y.o.   MRN: [id number]    CARDIOLOGY OFFICE NOTE  Date:  03/10/2016   [name] Date of Birth: 04-06-1931 Medical Record [id number]  PCP:  Jani Gravel, MD  Cardiologist:  Former patient of Dr. Kae Heller, now transferred to me Ena Dawley)    Chief complain: dizziness, balance problems  History of Present Illness: [name] [name] is a 83 y.o. male with history of hypertension, CK D stage III, AAA repair, prior CVA, PVD and CAD with remote CABG by Dr. Roxan Hockey. His former patient of Dr. Ron Parker last seen in August 2016 and in between was seen by Truitt Merle. Patient denies any chest pain he has stable shortness of breath. He was diagnosed with significant bradycardia and second-degree AV block type I and seen by Dr. Rayann Heman who suggested a pacemaker placement. After hearing all the possible complications patient decided to proceed with conservative management for now. He has on and off lower extremity edema, no orthopnea no paroxysmal nocturnal dyspnea. His major problem was balance issue and dizziness mostly when he changes position in bed and he feels like room is spinning around him. He has been working with a physical therapist that comes twice a week to his house and his symptoms have improved. He denies any palpitations or syncope.   12/09/2015 - the patient is coming after 6 months, he states that he has been feeling progressively more tired and weaker over the last 6 months. He can walk minimally at home and denies any dizziness or presyncope or syncope. He fell recently at dividing or he tripped over a chair. He denies any lower extremity edema orthopnea or proximal nocturnal dyspnea. His wife checks his blood pressure at home and it usually runs anywhere between 140s to 160. She also states that his heart rate can be as low as in 40s.  03/10/2016 - this is a 3 months follow-up, the patient states that there have been no changes to  his symptoms in fact he feels less dizziness and he has had no falls since the last visit. He underwent Holter monitoring that showed episodes of 2 to one block with heart rate down to 20s and 30s, however he is adamant that he doesn't want a pacemaker yet as his symptoms are improving. He denies any chest pain or shortness of breath and doesn't have lower extremity edema.  Past Medical History:  Diagnosis Date  . AAA (abdominal aortic aneurysm) [facility])    Surgical repair June, 2014  . CAD (coronary artery disease)   . Carotid artery occlusion   . Chronic renal insufficiency   . COPD (chronic obstructive pulmonary disease) ([place]) 08/15/2012  . CVA (cerebral vascular accident) ([place])    residual R leg weakness  . Diabetes mellitus without complication ([place])   . Dyslipidemia   . Fall at home Feb.4, 2015   Fx Right Hip  . HTN (hypertension)   . Hx of CABG    2005  . Leg pain   . Morbid obesity ([place])   . Neuropathy ([place])    Right foot  . Peripheral vascular disease ([place])   . Renal artery stenosis ([place])   . Status post abdominal aortic aneurysm repair    June, 2014  . Tobacco abuse    quit 2014    Past Surgical History:  Procedure Laterality Date  . ABDOMINAL AORTAGRAM N/A 08/07/2012   Procedure: ABDOMINAL AORTAGRAM;  Surgeon: Rosetta Posner, MD;  Location: [facility] CATH LAB;  Service: Cardiovascular;  Laterality: N/A;  . AORTA - BILATERAL FEMORAL ARTERY BYPASS GRAFT N/A 08/09/2012   Procedure: AORTA BIFEMORAL BYPASS GRAFT;  Surgeon: Rosetta Posner, MD;  Location: [facility];  Service: Vascular;  Laterality: N/A;  . CORONARY ARTERY BYPASS GRAFT  2005   x4  . HIP ARTHROPLASTY Right 04/09/2013   Procedure: ARTHROPLASTY BIPOLAR HIP;  Surgeon: Gearlean Alf, MD;  [facility];  Service: Orthopedics;  Laterality: Right;  . JOINT REPLACEMENT Right Feb. 4, 2015   Hip- Pt fell  . SPINE SURGERY  1977   Lumbar HNP surgery   Medications: Current Outpatient Prescriptions  Medication Sig Dispense Refill    . aspirin 81 MG tablet Take 81 mg by mouth daily.    Marland Kitchen atorvastatin (LIPITOR) 40 MG tablet Take 40 mg by mouth daily.     . cholecalciferol (VITAMIN D) 1000 UNITS tablet Take 1,000 Units by mouth daily.     . clopidogrel (PLAVIX) 75 MG tablet Take 1 tablet by mouth daily.    Marland Kitchen escitalopram (LEXAPRO) 5 MG tablet Take 5 mg by mouth daily.     . furosemide (LASIX) 20 MG tablet Take 10 mg by mouth daily.    Marland Kitchen gabapentin (NEURONTIN) 300 MG capsule Take 300 mg by mouth daily.    Marland Kitchen lisinopril (PRINIVIL,ZESTRIL) 10 MG tablet Take 10 mg by mouth daily.    Marland Kitchen oxybutynin (DITROPAN-XL) 5 MG 24 hr tablet Take 5 mg by mouth daily.     Marland Kitchen PROAIR HFA 108 (90 BASE) MCG/ACT inhaler Inhale 1 puff into the lungs every 4 (four) hours as needed for wheezing or shortness of breath.     . saxagliptin HCl (ONGLYZA) 5 MG TABS tablet Take 2.5 mg by mouth daily.     . traMADol (ULTRAM) 50 MG tablet Take 1 tablet by mouth every 6 (six) hours as needed for moderate pain.     . vitamin B-12 (CYANOCOBALAMIN) 100 MCG tablet Take 100 mcg by mouth daily.     Marland Kitchen zolpidem (AMBIEN) 10 MG tablet Take 5 mg by mouth at bedtime as needed for sleep.      No current facility-administered medications for this visit.    Allergies: No Known Allergies  Social History: The patient  reports that he quit smoking about 3 years ago. His smoking use included Cigarettes. He has a 25.00 pack-year smoking history. He has never used smokeless tobacco. He reports that he drinks alcohol. He reports that he does not use drugs.   Family History: The patient's family history includes Coronary artery disease in his father; Tuberculosis in his mother; Varicose Veins in his father.   Review of Systems: Please see the history of present illness.   Otherwise, the review of systems is positive for none.   All other systems are reviewed and negative.   Physical Exam: VS:  There were no vitals taken for this visit. Marland Kitchen  BMI There is no height or weight on  file to calculate BMI.  Wt Readings from Last 3 Encounters:  01/05/16 217 lb 6.4 oz (98.6 kg)  12/09/15 217 lb (98.4 kg)  05/28/15 224 lb (101.6 kg)   General: Pleasant. Chronically ill appearing but in no acute distress.  HEENT: Normal. Neck: Supple, no JVD, carotid bruits, or masses noted.  Cardiac: Irregular rhythm. Rate a little slow. Trace edema.  Respiratory:  Lungs are fairly clear to auscultation bilaterally with normal work of breathing.  GI: Soft and nontender.  MS: No deformity or atrophy. Gait not tested - he is in a wheelchair. Skin: Warm and dry. Color is normal.  Neuro:  Strength and sensation are intact and no gross focal deficits noted.  Psych: Alert, appropriate and with normal affect.   LABORATORY DATA:  EKG:  EKG is ordered today. This demonstrates 2nd degree AV block - type I - Wenkebach - his rate is 53.  Lab Results  Component Value Date   WBC 11.5 (H) 01/19/2015   HGB 12.7 (L) 01/19/2015   HCT 39.3 01/19/2015   PLT 239 01/19/2015   GLUCOSE 96 01/19/2015   ALT 21 08/19/2012   AST 31 08/19/2012   NA 140 01/19/2015   K 4.2 01/19/2015   CL 104 01/19/2015   CREATININE 1.77 (H) 01/19/2015   BUN 22 01/19/2015   CO2 25 01/19/2015   TSH 1.989 01/19/2015   INR 0.98 04/08/2013   HGBA1C 6.1 (H) 08/10/2012   Other Studies Reviewed Today:  Myoview Impression from 08/2011 Exercise Capacity: Lexiscan with no exercise. BP Response: Normal blood pressure response. Clinical Symptoms: No chest pain or dyspnea. ECG Impression: No significant ST segment change suggestive of ischemia. Comparison with Prior Nuclear Study: No previous nuclear study performed.  Overall Impression: Abnormal stress nuclear study with a mild, small, fixed apical defect and a medium, moderate size , fixed inferobasal defect consistent with apical thinning and a prior inferobasal infarct; no ischemia.  TTE: 01/27/2015 - Left ventricle: The cavity size was normal. Wall thickness  was  increased in a pattern of mild LVH. Systolic function was normal.  The estimated ejection fraction was in the range of 55% to 60%.  Wall motion was normal; there were no regional wall motion  abnormalities. Doppler parameters are consistent with abnormal  left ventricular relaxation (grade 1 diastolic dysfunction). - Aortic valve: There was no stenosis. - Mitral valve: Mildly calcified annulus. Mildly calcified leaflets  . There was no significant regurgitation. - Right ventricle: Poorly visualized. The cavity size was normal.  Systolic function was normal. - Pulmonary arteries: No complete TR doppler jet so unable to  estimate PA systolic pressure. - Inferior vena cava: The vessel was normal in size. The  respirophasic diameter changes were in the normal range (>= 50%),  consistent with normal central venous pressure.  48 hour Holter monitor: 12/20/2015  NSR with epiosodes of sinus brady  Mobitz 1 AV block  1 Episode of 15 beats of Nonsustained Ventricluar tachycardia  There are episodes of 2:1 block with ventricular HR in the 20s and 30s.  The patient should be considered for pacemaker .    Assessment/Plan:  1. 2. AV block - Mobitz type 1, intermittent 2 to one block with heart rate down to 30s - evaluated by Dr. Rayann Heman and pacemaker placement suggested however patient wants to continue conservative management. He is advised to inform us if his symptoms of fatigue and dizziness gets worse, it seems that his symptoms get significantly worse, he again states that he would prefer not to get a pacemaker is his there file the procedure however his wife disagrees. We'll start a 48 hour Holter monitor to evaluate for any significant bradycardia AV blocks or pauses. We will rediscuss after we have results. His EKG today shows first-degree AV block, previously second-degree AV block Mobitz 1. Since his symptoms are stable he still very firm decision of not getting a  pacemaker at this point unless his symptoms worsen.  2. CAD - s/p CABG - he  is asymptomatic, no ischemic workup is necessary at this point. Continue aspirin, Plavix, atorvastatin, decrease dose of lisinopril to 5 mg daily with worsening creatinine, no beta blocker secondary to second-degree AV block.  3. Chronic diastolic CHF - he appears euvolemic he is advised to continue using 20 mg of Lasix daily.   4. HTN - well controlled, decrease lisinopril to 5 mg po daily  5. DM  6. CKD stage III- followed by nephrology  Follow up in 3 months.  Signed: Ena Dawley 03/10/2016 1:56 PM  [facility] [address] [place], [place]  [address] Phone: [phone] Fax: [phone]

## 2016-03-10 NOTE — Patient Instructions (Signed)
Medication Instructions:   DECREASE YOUR LISINOPRIL TO 5 MG ONCE DAILY    Follow-Up:  3 MONTHS WITH DR Meda Coffee       If you need a refill on your cardiac medications before your next appointment, please call your pharmacy.

## 2016-03-21 ENCOUNTER — Encounter: Payer: Self-pay | Admitting: Cardiology

## 2016-03-21 DIAGNOSIS — I10 Essential (primary) hypertension: Secondary | ICD-10-CM | POA: Diagnosis not present

## 2016-03-21 DIAGNOSIS — E119 Type 2 diabetes mellitus without complications: Secondary | ICD-10-CM | POA: Diagnosis not present

## 2016-03-21 DIAGNOSIS — E7800 Pure hypercholesterolemia, unspecified: Secondary | ICD-10-CM | POA: Diagnosis not present

## 2016-03-28 DIAGNOSIS — I10 Essential (primary) hypertension: Secondary | ICD-10-CM | POA: Diagnosis not present

## 2016-03-28 DIAGNOSIS — E7800 Pure hypercholesterolemia, unspecified: Secondary | ICD-10-CM | POA: Diagnosis not present

## 2016-03-28 DIAGNOSIS — G4700 Insomnia, unspecified: Secondary | ICD-10-CM | POA: Diagnosis not present

## 2016-03-28 DIAGNOSIS — E119 Type 2 diabetes mellitus without complications: Secondary | ICD-10-CM | POA: Diagnosis not present

## 2016-04-05 ENCOUNTER — Encounter: Payer: Self-pay | Admitting: *Deleted

## 2016-04-06 ENCOUNTER — Ambulatory Visit: Payer: Medicare Other | Admitting: Internal Medicine

## 2016-04-10 ENCOUNTER — Encounter: Payer: Self-pay | Admitting: Internal Medicine

## 2016-04-10 ENCOUNTER — Ambulatory Visit (INDEPENDENT_AMBULATORY_CARE_PROVIDER_SITE_OTHER): Payer: Medicare Other | Admitting: Internal Medicine

## 2016-04-10 VITALS — BP 140/70 | HR 50 | Ht 70.0 in | Wt 216.0 lb

## 2016-04-10 DIAGNOSIS — I10 Essential (primary) hypertension: Secondary | ICD-10-CM | POA: Diagnosis not present

## 2016-04-10 DIAGNOSIS — I441 Atrioventricular block, second degree: Secondary | ICD-10-CM

## 2016-04-10 DIAGNOSIS — I2510 Atherosclerotic heart disease of native coronary artery without angina pectoris: Secondary | ICD-10-CM

## 2016-04-10 NOTE — Patient Instructions (Signed)
Medication Instructions:  Your physician recommends that you continue on your current medications as directed. Please refer to the Current Medication list given to you today.  Labwork: None ordered.  Testing/Procedures: None ordered.  Follow-Up: Your physician recommends that you schedule a follow-up appointment as needed.   Any Other Special Instructions Will Be Listed Below (If Applicable).     If you need a refill on your cardiac medications before your next appointment, please call your pharmacy.   

## 2016-04-10 NOTE — Progress Notes (Signed)
Electrophysiology Office Note   Date:  04/10/2016   ID:  BRAELYN CORLETT, DOB [birth year], MRN [id number]  PCP:  Jani Gravel, MD  Cardiologist:  Dr Meda Coffee (previously Dr Ron Parker) Primary Electrophysiologist: Thompson Grayer, MD    CC: mobitz I second degree AV block   History of Present Illness: [name] [name] is a 83 y.o. male who presents today for electrophysiology evaluation.  He has done very well since his last visit.  He is not very active.  He mostly watches TV. He has occasional postural dizziness. He denies fatigue and thinks that his energy is ok.  He denies syncope or near syncope.  Overall, he feels that his symptoms are no different that when I saw him last.  He is clear that he wishes to avoid pacing.  Today, he denies symptoms of palpitations, chest pain, shortness of breath (above baseline), orthopnea, PND,  bleeding, or neurologic sequela.  He has chronic edema.  He has chronic renal failure and is followed by nephrology.  The patient is tolerating medications without difficulties and is otherwise without complaint today.    Past Medical History:  Diagnosis Date  . AAA (abdominal aortic aneurysm) [facility])    Surgical repair June, 2014  . CAD (coronary artery disease)   . Carotid artery occlusion   . Chronic renal insufficiency   . COPD (chronic obstructive pulmonary disease) ([place]) 08/15/2012  . CVA (cerebral vascular accident) ([place])    residual R leg weakness  . Diabetes mellitus without complication ([place])   . Dyslipidemia   . Fall at home Feb.4, 2015   Fx Right Hip  . HTN (hypertension)   . Hx of CABG    2005  . Leg pain   . Morbid obesity ([place])   . Neuropathy ([place])    Right foot  . Peripheral vascular disease ([place])   . Renal artery stenosis ([place])   . Status post abdominal aortic aneurysm repair    June, 2014  . Tobacco abuse    quit 2014   Past Surgical History:  Procedure Laterality Date  . ABDOMINAL AORTAGRAM N/A 08/07/2012   Procedure: ABDOMINAL  AORTAGRAM;  Surgeon: Rosetta Posner, MD;  Location: [facility] CATH LAB;  Service: Cardiovascular;  Laterality: N/A;  . AORTA - BILATERAL FEMORAL ARTERY BYPASS GRAFT N/A 08/09/2012   Procedure: AORTA BIFEMORAL BYPASS GRAFT;  Surgeon: Rosetta Posner, MD;  Location: [facility];  Service: Vascular;  Laterality: N/A;  . CORONARY ARTERY BYPASS GRAFT  2005   x4  . HIP ARTHROPLASTY Right 04/09/2013   Procedure: ARTHROPLASTY BIPOLAR HIP;  Surgeon: Gearlean Alf, MD;  Location: [facility];  Service: Orthopedics;  Laterality: Right;  . JOINT REPLACEMENT Right Feb. 4, 2015   Hip- Pt fell  . SPINE SURGERY  1977   Lumbar HNP surgery     Current Outpatient Prescriptions  Medication Sig Dispense Refill  . aspirin 81 MG tablet Take 81 mg by mouth daily.    Marland Kitchen atorvastatin (LIPITOR) 40 MG tablet Take 40 mg by mouth daily.     . cholecalciferol (VITAMIN D) 1000 UNITS tablet Take 1,000 Units by mouth daily.     . clopidogrel (PLAVIX) 75 MG tablet Take 1 tablet by mouth daily.    Marland Kitchen escitalopram (LEXAPRO) 5 MG tablet Take 5 mg by mouth daily.     . furosemide (LASIX) 20 MG tablet Take 10 mg by mouth daily.    Marland Kitchen gabapentin (NEURONTIN) 300 MG capsule Take 300 mg by mouth  daily.    . lisinopril (PRINIVIL,ZESTRIL) 5 MG tablet Take 1 tablet (5 mg total) by mouth daily. 90 tablet 3  . oxybutynin (DITROPAN-XL) 5 MG 24 hr tablet Take 5 mg by mouth daily.     . pioglitazone (ACTOS) 15 MG tablet Take 15 mg by mouth daily.    Marland Kitchen PROAIR HFA 108 (90 BASE) MCG/ACT inhaler Inhale 1 puff into the lungs every 4 (four) hours as needed for wheezing or shortness of breath.     . traMADol (ULTRAM) 50 MG tablet Take 1 tablet by mouth every 6 (six) hours as needed for moderate pain.     . vitamin B-12 (CYANOCOBALAMIN) 100 MCG tablet Take 100 mcg by mouth daily.     Marland Kitchen zolpidem (AMBIEN) 10 MG tablet Take 5 mg by mouth at bedtime as needed for sleep.      No current facility-administered medications for this visit.     Allergies:   Patient has no known  allergies.   Social History:  The patient  reports that he quit smoking about 3 years ago. His smoking use included Cigarettes. He has a 25.00 pack-year smoking history. He has never used smokeless tobacco. He reports that he drinks alcohol. He reports that he does not use drugs.   Family History:  The patient's  family history includes Coronary artery disease in his father; Tuberculosis (age of onset: 6) in his mother; Varicose Veins in his father.    ROS:  Please see the history of present illness.   All other systems are reviewed and negative.    PHYSICAL EXAM: VS:  BP 140/70   Pulse (!) 50   Ht 5\' 10"  (1.778 m)   Wt 216 lb (98 kg)   BMI 30.99 kg/m  , BMI Body mass index is 30.99 kg/m. GEN: overweight and elderly, in no acute distress  HEENT: normal  Neck: no JVD, carotid bruits, or masses Cardiac: RRR; no murmurs, rubs, or gallops,no edema  Respiratory:  clear to auscultation bilaterally, normal work of breathing GI: soft, nontender, nondistended, + BS MS: no deformity or atrophy  Skin: warm and dry  Neuro:    in a wheelchair today Psych: euthymic mood, full affect  Wt Readings from Last 3 Encounters:  04/10/16 216 lb (98 kg)  03/10/16 223 lb (101.2 kg)  01/05/16 217 lb 6.4 oz (98.6 kg)     Other studies Reviewed: Additional studies/ records that were reviewed today include: holter monitor and Dr Thera Flake notes are reviewed  ASSESSMENT AND PLAN:  1. Mobitz I second degree AV block Doing very well with minimal symptoms.  EKG 1/18 reveals stable mobitz I second degree AV block.  He wishes to continue conservative management with watchful waiting.  Given his advanced age and paucity of symptoms, I think that this is appropriate.  2. Obesity Body mass index is 30.99 kg/m. Weight reduction advised  3. HTN Stable No change required today   4. CAD No ischemic symptoms  Return to see me as needed Follow-up with Dr Meda Coffee as scheduled  Signed, Thompson Grayer, MD    04/10/2016   [address] [place] [address] [id number] [phone] (office) [phone] (fax)

## 2016-05-25 DIAGNOSIS — D509 Iron deficiency anemia, unspecified: Secondary | ICD-10-CM | POA: Diagnosis not present

## 2016-05-25 DIAGNOSIS — N183 Chronic kidney disease, stage 3 (moderate): Secondary | ICD-10-CM | POA: Diagnosis not present

## 2016-05-30 ENCOUNTER — Ambulatory Visit (INDEPENDENT_AMBULATORY_CARE_PROVIDER_SITE_OTHER): Payer: Medicare Other | Admitting: Cardiology

## 2016-05-30 ENCOUNTER — Encounter: Payer: Self-pay | Admitting: Cardiology

## 2016-05-30 VITALS — BP 126/74 | HR 58 | Ht 70.0 in | Wt 213.0 lb

## 2016-05-30 DIAGNOSIS — Z8679 Personal history of other diseases of the circulatory system: Secondary | ICD-10-CM

## 2016-05-30 DIAGNOSIS — Z951 Presence of aortocoronary bypass graft: Secondary | ICD-10-CM

## 2016-05-30 DIAGNOSIS — I441 Atrioventricular block, second degree: Secondary | ICD-10-CM

## 2016-05-30 DIAGNOSIS — I2510 Atherosclerotic heart disease of native coronary artery without angina pectoris: Secondary | ICD-10-CM | POA: Diagnosis not present

## 2016-05-30 DIAGNOSIS — R42 Dizziness and giddiness: Secondary | ICD-10-CM

## 2016-05-30 DIAGNOSIS — I10 Essential (primary) hypertension: Secondary | ICD-10-CM

## 2016-05-30 MED ORDER — FUROSEMIDE 20 MG PO TABS: 10.0000 mg | ORAL_TABLET | Freq: Every day | ORAL | 3 refills | Status: DC | PRN

## 2016-05-30 NOTE — Progress Notes (Signed)
Electrophysiology Office Note   Date:  05/30/2016   ID:  [name] [name], DOB 1931/11/16, MRN [id number]  PCP:  Jani Gravel, MD  Cardiologist:  Dr Meda Coffee (previously Dr Ron Parker) Primary Electrophysiologist: Dr Thompson Grayer  CC: mobitz I second degree AV block   History of Present Illness: [name] [name] is a 83 y.o. male who presents today for electrophysiology evaluation.  He has done very well since his last visit.  He is not very active.  He mostly watches TV. He has occasional postural dizziness. He denies fatigue and thinks that his energy is ok.  He denies syncope or near syncope.  Overall, he feels that his symptoms are no different that when I saw him last.  He is clear that he wishes to avoid pacing.  05/30/2016 - the patient is coming after 2 months, he denies any presyncope or syncope but he has been feeling dizzy especially when standing up. He has been compliant with his medicines has no side effects. He denies any recent lower extremity edema in the orthopnea or proximal nocturnal dyspnea he also denies chest pain.  Past Medical History:  Diagnosis Date  . AAA (abdominal aortic aneurysm) [facility])    Surgical repair June, 2014  . CAD (coronary artery disease)   . Carotid artery occlusion   . Chronic renal insufficiency   . COPD (chronic obstructive pulmonary disease) ([place]) 08/15/2012  . CVA (cerebral vascular accident) ([place])    residual R leg weakness  . Diabetes mellitus without complication ([place])   . Dyslipidemia   . Fall at home Feb.4, 2015   Fx Right Hip  . HTN (hypertension)   . Hx of CABG    2005  . Leg pain   . Morbid obesity ([place])   . Neuropathy ([place])    Right foot  . Peripheral vascular disease ([place])   . Renal artery stenosis ([place])   . Status post abdominal aortic aneurysm repair    June, 2014  . Tobacco abuse    quit 2014   Past Surgical History:  Procedure Laterality Date  . ABDOMINAL AORTAGRAM N/A 08/07/2012   Procedure: ABDOMINAL AORTAGRAM;   Surgeon: Rosetta Posner, MD;  Location: [facility] CATH LAB;  Service: Cardiovascular;  Laterality: N/A;  . AORTA - BILATERAL FEMORAL ARTERY BYPASS GRAFT N/A 08/09/2012   Procedure: AORTA BIFEMORAL BYPASS GRAFT;  Surgeon: Rosetta Posner, MD;  Location: [facility];  Service: Vascular;  Laterality: N/A;  . CORONARY ARTERY BYPASS GRAFT  2005   x4  . HIP ARTHROPLASTY Right 04/09/2013   Procedure: ARTHROPLASTY BIPOLAR HIP;  Surgeon: Gearlean Alf, MD;  [facility]: [facility];  Service: Orthopedics;  Laterality: Right;  . JOINT REPLACEMENT Right Feb. 4, 2015   Hip- Pt fell  . SPINE SURGERY  1977   Lumbar HNP surgery     Current Outpatient Prescriptions  Medication Sig Dispense Refill  . aspirin 81 MG tablet Take 81 mg by mouth daily.    Marland Kitchen atorvastatin (LIPITOR) 40 MG tablet Take 40 mg by mouth daily.     . cholecalciferol (VITAMIN D) 1000 UNITS tablet Take 1,000 Units by mouth daily.     . clopidogrel (PLAVIX) 75 MG tablet Take 1 tablet by mouth daily.    Marland Kitchen escitalopram (LEXAPRO) 5 MG tablet Take 5 mg by mouth daily.     . furosemide (LASIX) 20 MG tablet Take 10 mg by mouth daily.    Marland Kitchen gabapentin (NEURONTIN) 300 MG capsule Take 300 mg by  mouth daily.    Marland Kitchen lisinopril (PRINIVIL,ZESTRIL) 5 MG tablet Take 1 tablet (5 mg total) by mouth daily. 90 tablet 3  . oxybutynin (DITROPAN-XL) 5 MG 24 hr tablet Take 5 mg by mouth daily.     . pioglitazone (ACTOS) 15 MG tablet Take 15 mg by mouth daily.    Marland Kitchen PROAIR HFA 108 (90 BASE) MCG/ACT inhaler Inhale 1 puff into the lungs every 4 (four) hours as needed for wheezing or shortness of breath.     . traMADol (ULTRAM) 50 MG tablet Take 1 tablet by mouth every 6 (six) hours as needed for moderate pain.     . vitamin B-12 (CYANOCOBALAMIN) 100 MCG tablet Take 100 mcg by mouth daily.     Marland Kitchen zolpidem (AMBIEN) 10 MG tablet Take 5 mg by mouth at bedtime as needed for sleep.      No current facility-administered medications for this visit.     Allergies:   Patient has no known allergies.     Social History:  The patient  reports that he quit smoking about 3 years ago. His smoking use included Cigarettes. He has a 25.00 pack-year smoking history. He has never used smokeless tobacco. He reports that he drinks alcohol. He reports that he does not use drugs.   Family History:  The patient's  family history includes Coronary artery disease in his father; Tuberculosis (age of onset: 24) in his mother; Varicose Veins in his father.    ROS:  Please see the history of present illness.   All other systems are reviewed and negative.    PHYSICAL EXAM: VS:  BP 126/74   Pulse (!) 58   Ht 5\' 10"  (1.778 m)   Wt 213 lb (96.6 kg)   SpO2 98%   BMI 30.56 kg/m  , BMI Body mass index is 30.56 kg/m. GEN: overweight and elderly, in no acute distress  HEENT: normal  Neck: no JVD, carotid bruits, or masses Cardiac: RRR; no murmurs, rubs, or gallops,no edema  Respiratory:  clear to auscultation bilaterally, normal work of breathing GI: soft, nontender, nondistended, + BS MS: no deformity or atrophy  Skin: warm and dry  Neuro:    in a wheelchair today Psych: euthymic mood, full affect  Wt Readings from Last 3 Encounters:  05/30/16 213 lb (96.6 kg)  04/10/16 216 lb (98 kg)  03/10/16 223 lb (101.2 kg)     Other studies Reviewed: Additional studies/ records that were reviewed today include: holter monitor and Dr Thera Flake notes are reviewed  ECG: Sinus rhythm with second-degree AV block Mobitz 1 nonspecific ST-T wave abnormalities unchanged from prior.   ASSESSMENT AND PLAN:  1. Mobitz I second degree AV block The patient is doing well he has no evidence of progression of his Mobitz 1 second-degree AV block, he has recently seen Dr. Rayann Heman and for now decided just to do conservative management and watchful waiting.   2. Obesity Body mass index is 30.56 kg/m. Weight reduction advised  3. HTN The patient is rather hypotensive and symptomatic with orthostatic hypotension, will  discontinue Lasix daily and started just as needed. We'll also discontinue lisinopril. His wife will follow his blood pressure measurements and call us if his blood pressure gets above  140 mmHg.   4. CAD No ischemic symptoms  Follow up in 4 months.  Signed, Ena Dawley, MD  05/30/2016   [address] [place] [address] [phone] (office) [phone] (fax)

## 2016-05-30 NOTE — Patient Instructions (Addendum)
Medication Instructions:   STOP TAKING LISINOPRIL NOW  CUT YOUR LASIX BACK TO 10 MG BY MOUTH DAILY ONLY AS NEEDED FOR LOWER EXTREMITY SWELLING AND WEIGHT GAIN     Follow-UP:  4 MONTHS WITH DR Meda Coffee       If you need a refill on your cardiac medications before your next appointment, please call your pharmacy.

## 2016-06-14 DIAGNOSIS — I441 Atrioventricular block, second degree: Secondary | ICD-10-CM | POA: Diagnosis not present

## 2016-06-14 DIAGNOSIS — M069 Rheumatoid arthritis, unspecified: Secondary | ICD-10-CM | POA: Diagnosis not present

## 2016-06-14 DIAGNOSIS — D472 Monoclonal gammopathy: Secondary | ICD-10-CM | POA: Diagnosis not present

## 2016-06-14 DIAGNOSIS — E119 Type 2 diabetes mellitus without complications: Secondary | ICD-10-CM | POA: Diagnosis not present

## 2016-06-14 DIAGNOSIS — D509 Iron deficiency anemia, unspecified: Secondary | ICD-10-CM | POA: Diagnosis not present

## 2016-06-14 DIAGNOSIS — N183 Chronic kidney disease, stage 3 (moderate): Secondary | ICD-10-CM | POA: Diagnosis not present

## 2016-06-14 DIAGNOSIS — I2510 Atherosclerotic heart disease of native coronary artery without angina pectoris: Secondary | ICD-10-CM | POA: Diagnosis not present

## 2016-06-14 DIAGNOSIS — E785 Hyperlipidemia, unspecified: Secondary | ICD-10-CM | POA: Diagnosis not present

## 2016-06-14 DIAGNOSIS — I639 Cerebral infarction, unspecified: Secondary | ICD-10-CM | POA: Diagnosis not present

## 2016-06-14 DIAGNOSIS — I10 Essential (primary) hypertension: Secondary | ICD-10-CM | POA: Diagnosis not present

## 2016-06-14 DIAGNOSIS — I714 Abdominal aortic aneurysm, without rupture: Secondary | ICD-10-CM | POA: Diagnosis not present

## 2016-06-14 DIAGNOSIS — N2581 Secondary hyperparathyroidism of renal origin: Secondary | ICD-10-CM | POA: Diagnosis not present

## 2016-06-20 DIAGNOSIS — E7800 Pure hypercholesterolemia, unspecified: Secondary | ICD-10-CM | POA: Diagnosis not present

## 2016-06-20 DIAGNOSIS — I10 Essential (primary) hypertension: Secondary | ICD-10-CM | POA: Diagnosis not present

## 2016-06-20 DIAGNOSIS — Z125 Encounter for screening for malignant neoplasm of prostate: Secondary | ICD-10-CM | POA: Diagnosis not present

## 2016-06-20 DIAGNOSIS — E119 Type 2 diabetes mellitus without complications: Secondary | ICD-10-CM | POA: Diagnosis not present

## 2016-06-22 DIAGNOSIS — N183 Chronic kidney disease, stage 3 (moderate): Secondary | ICD-10-CM | POA: Diagnosis not present

## 2016-06-22 DIAGNOSIS — I10 Essential (primary) hypertension: Secondary | ICD-10-CM | POA: Diagnosis not present

## 2016-06-27 DIAGNOSIS — E7800 Pure hypercholesterolemia, unspecified: Secondary | ICD-10-CM | POA: Diagnosis not present

## 2016-06-27 DIAGNOSIS — I10 Essential (primary) hypertension: Secondary | ICD-10-CM | POA: Diagnosis not present

## 2016-06-27 DIAGNOSIS — J449 Chronic obstructive pulmonary disease, unspecified: Secondary | ICD-10-CM | POA: Diagnosis not present

## 2016-06-27 DIAGNOSIS — E119 Type 2 diabetes mellitus without complications: Secondary | ICD-10-CM | POA: Diagnosis not present

## 2016-07-03 ENCOUNTER — Telehealth: Payer: Self-pay | Admitting: Cardiology

## 2016-07-03 DIAGNOSIS — I10 Essential (primary) hypertension: Secondary | ICD-10-CM | POA: Diagnosis not present

## 2016-07-03 NOTE — Telephone Encounter (Signed)
Wife calling to report how the pts visit went with his PCP Dr Maudie Mercury today, for BP management.  Per the pts wife, pts BP is now staying at 140/70s.  Wife states this is a huge improvement, from 190s/90s.  Wife reports that his PCP had to reintroduce lisinopril 10 mg po daily and hydralazine 10 mg po QID to his regimen, despite his kidney MD advising otherwise.  Wife states this could be short term, but for now, Dr Maudie Mercury wants him to stay on this regimen, to control his HTN.  Wife states that the pt will be going back for another follow-up appt with Dr Maudie Mercury PCP, on next Wednesday 5/9.  Wife reports that she will call our office after that follow-up appt, to keep Dr Meda Coffee in-the-loop.  Informed the pts wife that I will route all this information to Dr Meda Coffee for her review.  Wife verbalized understanding and agrees with this plan.  Wife gracious for all the assistance provided.

## 2016-07-03 NOTE — Telephone Encounter (Signed)
Per pts wife, she provided information, as indicated in this message,  to Dr Meda Coffee as an [name] [name].  Pts BP has been running high, and his PCP has been following this closely.  Pts PCP restarted Lisinopril 10 mg po daily and restarted him back on hydralazine 10 mg po TID.  Pts PCP wanted to make Dr Meda Coffee aware of this.  Pts wife states that since PCP started the pts meds back, his BP is now 140/70 and HR-still in the 40s.  Wife reports that the pt is in his PCP office now for a follow-up OV to reassess his BP.  Wife reports she will call back later to report how his PCP appt went today.

## 2016-07-03 NOTE — Telephone Encounter (Signed)
New message      Pt c/o BP issue: STAT if pt c/o blurred vision, one-sided weakness or slurred speech  1. What are your last 5 BP readings? 195/82 HR 45 last night, 185/73 HR 40 this am 2. Are you having any other symptoms (ex. Dizziness, headache, blurred vision, passed out)?  Congestion and sob.  PCP put pt on an inhaler for this. 3. What is your BP issue?  Pt restarted lisinopril 10mg  around April 11th.  Pt started today taking hydralazine 10mg --2 tablets three times a day from PCP.  Wife is calling to let Dr Meda Coffee know all of this since she is his cardiologist.  Pt is going to PCP today at 1pm for a bp check

## 2016-07-12 ENCOUNTER — Telehealth: Payer: Self-pay | Admitting: Cardiology

## 2016-07-12 DIAGNOSIS — I10 Essential (primary) hypertension: Secondary | ICD-10-CM | POA: Diagnosis not present

## 2016-07-12 DIAGNOSIS — R001 Bradycardia, unspecified: Secondary | ICD-10-CM | POA: Diagnosis not present

## 2016-07-12 DIAGNOSIS — E7800 Pure hypercholesterolemia, unspecified: Secondary | ICD-10-CM | POA: Diagnosis not present

## 2016-07-12 DIAGNOSIS — R0600 Dyspnea, unspecified: Secondary | ICD-10-CM | POA: Diagnosis not present

## 2016-07-12 NOTE — Telephone Encounter (Signed)
New message   Pt c/o BP issue: STAT if pt c/o blurred vision, one-sided weakness or slurred speech  1. What are your last 5 BP readings? 128/58, 68 pulse - 151/88, 68 pulse  2. Are you having any other symptoms (ex. Dizziness, headache, blurred vision, passed out)? Small amounts of SOB, weight decreased  3. What is your BP issue? fluctuating

## 2016-07-12 NOTE — Telephone Encounter (Signed)
I spoke with the patient.   His reading of 128/58 was at the doctor office today.  His highest readings lately have been 160s/60s.  He and his wife wanted to update Dr. Meda Coffee.  Also, they received a recall letter today to schedule for July and were told they could not see her until September.  They would like to be on a cancellation list if possible.  [name] [name] is aware I am forwarding this to Dr. Meda Coffee and her nurse for review.

## 2016-07-13 NOTE — Telephone Encounter (Signed)
Spoke with the pts wife (on [place]), and she is requesting for an appt with Dr Francesca Oman NP, on 6/14, same day she will be seen in our office as a pt.  Wife states this facilitates easier commute with both appts on the same day.  Wife states that the pt went to his PCP yesterday and his BP was 128/58, and PCP advised for maintenance of this, he should schedule an appt with our office for sometime in June or July.  Scheduled the pt an appt with Cecilie Kicks NP for 6/14 at 3 pm, as wife requested.  Informed the pts wife that Dr Meda Coffee may provide further recommendations, and if she does, I will follow-up with both her and the pt accordingly thereafter.  Wife verbalized understanding and agrees with this plan.  Wife very gracious for the prompt follow-up.

## 2016-07-13 NOTE — Telephone Encounter (Signed)
That's ok, please add him on

## 2016-07-14 DIAGNOSIS — I129 Hypertensive chronic kidney disease with stage 1 through stage 4 chronic kidney disease, or unspecified chronic kidney disease: Secondary | ICD-10-CM | POA: Diagnosis not present

## 2016-07-14 DIAGNOSIS — N189 Chronic kidney disease, unspecified: Secondary | ICD-10-CM | POA: Diagnosis not present

## 2016-07-14 DIAGNOSIS — E1151 Type 2 diabetes mellitus with diabetic peripheral angiopathy without gangrene: Secondary | ICD-10-CM | POA: Diagnosis not present

## 2016-07-14 DIAGNOSIS — R269 Unspecified abnormalities of gait and mobility: Secondary | ICD-10-CM | POA: Diagnosis not present

## 2016-07-14 DIAGNOSIS — I2510 Atherosclerotic heart disease of native coronary artery without angina pectoris: Secondary | ICD-10-CM | POA: Diagnosis not present

## 2016-07-14 DIAGNOSIS — E1122 Type 2 diabetes mellitus with diabetic chronic kidney disease: Secondary | ICD-10-CM | POA: Diagnosis not present

## 2016-07-17 DIAGNOSIS — I129 Hypertensive chronic kidney disease with stage 1 through stage 4 chronic kidney disease, or unspecified chronic kidney disease: Secondary | ICD-10-CM | POA: Diagnosis not present

## 2016-07-17 DIAGNOSIS — R269 Unspecified abnormalities of gait and mobility: Secondary | ICD-10-CM | POA: Diagnosis not present

## 2016-07-17 DIAGNOSIS — E1122 Type 2 diabetes mellitus with diabetic chronic kidney disease: Secondary | ICD-10-CM | POA: Diagnosis not present

## 2016-07-17 DIAGNOSIS — N189 Chronic kidney disease, unspecified: Secondary | ICD-10-CM | POA: Diagnosis not present

## 2016-07-17 DIAGNOSIS — I2510 Atherosclerotic heart disease of native coronary artery without angina pectoris: Secondary | ICD-10-CM | POA: Diagnosis not present

## 2016-07-17 DIAGNOSIS — E1151 Type 2 diabetes mellitus with diabetic peripheral angiopathy without gangrene: Secondary | ICD-10-CM | POA: Diagnosis not present

## 2016-07-19 DIAGNOSIS — E1151 Type 2 diabetes mellitus with diabetic peripheral angiopathy without gangrene: Secondary | ICD-10-CM | POA: Diagnosis not present

## 2016-07-19 DIAGNOSIS — R269 Unspecified abnormalities of gait and mobility: Secondary | ICD-10-CM | POA: Diagnosis not present

## 2016-07-19 DIAGNOSIS — N189 Chronic kidney disease, unspecified: Secondary | ICD-10-CM | POA: Diagnosis not present

## 2016-07-19 DIAGNOSIS — I2510 Atherosclerotic heart disease of native coronary artery without angina pectoris: Secondary | ICD-10-CM | POA: Diagnosis not present

## 2016-07-19 DIAGNOSIS — I129 Hypertensive chronic kidney disease with stage 1 through stage 4 chronic kidney disease, or unspecified chronic kidney disease: Secondary | ICD-10-CM | POA: Diagnosis not present

## 2016-07-19 DIAGNOSIS — E1122 Type 2 diabetes mellitus with diabetic chronic kidney disease: Secondary | ICD-10-CM | POA: Diagnosis not present

## 2016-07-24 DIAGNOSIS — R269 Unspecified abnormalities of gait and mobility: Secondary | ICD-10-CM | POA: Diagnosis not present

## 2016-07-24 DIAGNOSIS — I2510 Atherosclerotic heart disease of native coronary artery without angina pectoris: Secondary | ICD-10-CM | POA: Diagnosis not present

## 2016-07-24 DIAGNOSIS — N189 Chronic kidney disease, unspecified: Secondary | ICD-10-CM | POA: Diagnosis not present

## 2016-07-24 DIAGNOSIS — E1122 Type 2 diabetes mellitus with diabetic chronic kidney disease: Secondary | ICD-10-CM | POA: Diagnosis not present

## 2016-07-24 DIAGNOSIS — I129 Hypertensive chronic kidney disease with stage 1 through stage 4 chronic kidney disease, or unspecified chronic kidney disease: Secondary | ICD-10-CM | POA: Diagnosis not present

## 2016-07-24 DIAGNOSIS — E1151 Type 2 diabetes mellitus with diabetic peripheral angiopathy without gangrene: Secondary | ICD-10-CM | POA: Diagnosis not present

## 2016-07-26 DIAGNOSIS — I2510 Atherosclerotic heart disease of native coronary artery without angina pectoris: Secondary | ICD-10-CM | POA: Diagnosis not present

## 2016-07-26 DIAGNOSIS — E1122 Type 2 diabetes mellitus with diabetic chronic kidney disease: Secondary | ICD-10-CM | POA: Diagnosis not present

## 2016-07-26 DIAGNOSIS — R269 Unspecified abnormalities of gait and mobility: Secondary | ICD-10-CM | POA: Diagnosis not present

## 2016-07-26 DIAGNOSIS — E1151 Type 2 diabetes mellitus with diabetic peripheral angiopathy without gangrene: Secondary | ICD-10-CM | POA: Diagnosis not present

## 2016-07-26 DIAGNOSIS — I129 Hypertensive chronic kidney disease with stage 1 through stage 4 chronic kidney disease, or unspecified chronic kidney disease: Secondary | ICD-10-CM | POA: Diagnosis not present

## 2016-07-26 DIAGNOSIS — N189 Chronic kidney disease, unspecified: Secondary | ICD-10-CM | POA: Diagnosis not present

## 2016-08-02 DIAGNOSIS — R269 Unspecified abnormalities of gait and mobility: Secondary | ICD-10-CM | POA: Diagnosis not present

## 2016-08-02 DIAGNOSIS — E1122 Type 2 diabetes mellitus with diabetic chronic kidney disease: Secondary | ICD-10-CM | POA: Diagnosis not present

## 2016-08-02 DIAGNOSIS — I2510 Atherosclerotic heart disease of native coronary artery without angina pectoris: Secondary | ICD-10-CM | POA: Diagnosis not present

## 2016-08-02 DIAGNOSIS — I129 Hypertensive chronic kidney disease with stage 1 through stage 4 chronic kidney disease, or unspecified chronic kidney disease: Secondary | ICD-10-CM | POA: Diagnosis not present

## 2016-08-02 DIAGNOSIS — E1151 Type 2 diabetes mellitus with diabetic peripheral angiopathy without gangrene: Secondary | ICD-10-CM | POA: Diagnosis not present

## 2016-08-02 DIAGNOSIS — N189 Chronic kidney disease, unspecified: Secondary | ICD-10-CM | POA: Diagnosis not present

## 2016-08-07 DIAGNOSIS — E1122 Type 2 diabetes mellitus with diabetic chronic kidney disease: Secondary | ICD-10-CM | POA: Diagnosis not present

## 2016-08-07 DIAGNOSIS — R269 Unspecified abnormalities of gait and mobility: Secondary | ICD-10-CM | POA: Diagnosis not present

## 2016-08-07 DIAGNOSIS — E1151 Type 2 diabetes mellitus with diabetic peripheral angiopathy without gangrene: Secondary | ICD-10-CM | POA: Diagnosis not present

## 2016-08-07 DIAGNOSIS — I2510 Atherosclerotic heart disease of native coronary artery without angina pectoris: Secondary | ICD-10-CM | POA: Diagnosis not present

## 2016-08-07 DIAGNOSIS — N189 Chronic kidney disease, unspecified: Secondary | ICD-10-CM | POA: Diagnosis not present

## 2016-08-07 DIAGNOSIS — I129 Hypertensive chronic kidney disease with stage 1 through stage 4 chronic kidney disease, or unspecified chronic kidney disease: Secondary | ICD-10-CM | POA: Diagnosis not present

## 2016-08-08 ENCOUNTER — Other Ambulatory Visit: Payer: Self-pay | Admitting: Cardiology

## 2016-08-10 ENCOUNTER — Telehealth: Payer: Self-pay | Admitting: *Deleted

## 2016-08-10 DIAGNOSIS — I2510 Atherosclerotic heart disease of native coronary artery without angina pectoris: Secondary | ICD-10-CM | POA: Diagnosis not present

## 2016-08-10 DIAGNOSIS — E1151 Type 2 diabetes mellitus with diabetic peripheral angiopathy without gangrene: Secondary | ICD-10-CM | POA: Diagnosis not present

## 2016-08-10 DIAGNOSIS — N189 Chronic kidney disease, unspecified: Secondary | ICD-10-CM | POA: Diagnosis not present

## 2016-08-10 DIAGNOSIS — E1122 Type 2 diabetes mellitus with diabetic chronic kidney disease: Secondary | ICD-10-CM | POA: Diagnosis not present

## 2016-08-10 DIAGNOSIS — R269 Unspecified abnormalities of gait and mobility: Secondary | ICD-10-CM | POA: Diagnosis not present

## 2016-08-10 DIAGNOSIS — I129 Hypertensive chronic kidney disease with stage 1 through stage 4 chronic kidney disease, or unspecified chronic kidney disease: Secondary | ICD-10-CM | POA: Diagnosis not present

## 2016-08-10 MED ORDER — LISINOPRIL 5 MG PO TABS: 5.0000 mg | ORAL_TABLET | Freq: Every day | ORAL | 2 refills | Status: DC

## 2016-08-10 MED ORDER — HYDRALAZINE HCL 25 MG PO TABS: 25.0000 mg | ORAL_TABLET | Freq: Three times a day (TID) | ORAL | 2 refills | Status: DC

## 2016-08-10 NOTE — Telephone Encounter (Signed)
Per Dr Meda Coffee, verbal orders given to call this pt in 2 med changes, for noted HTN.  Wife was into see Dr Meda Coffee in clinic today, and the pt also is under her care.  Per the pts wife, he has been having high BP readings.  Per Dr Meda Coffee, she endorsed to the pts wife (who is primary caretaker), that we will decrease his lisinopril to 5 mg po daily, and increase his hydralazine to 25 mg po TID.  Confirmed the pharmacy of choice with the pts wife.  Wife fixes the pts meds for him.  Wife verbalized understanding and agrees with this plan. Pt scheduled to see a PA-C next week, for this issue.

## 2016-08-15 ENCOUNTER — Encounter: Payer: Self-pay | Admitting: Cardiology

## 2016-08-15 DIAGNOSIS — E7800 Pure hypercholesterolemia, unspecified: Secondary | ICD-10-CM | POA: Diagnosis not present

## 2016-08-15 DIAGNOSIS — I10 Essential (primary) hypertension: Secondary | ICD-10-CM | POA: Diagnosis not present

## 2016-08-15 DIAGNOSIS — E119 Type 2 diabetes mellitus without complications: Secondary | ICD-10-CM | POA: Diagnosis not present

## 2016-08-15 DIAGNOSIS — R0600 Dyspnea, unspecified: Secondary | ICD-10-CM | POA: Diagnosis not present

## 2016-08-17 ENCOUNTER — Ambulatory Visit (INDEPENDENT_AMBULATORY_CARE_PROVIDER_SITE_OTHER): Payer: Medicare Other | Admitting: Cardiology

## 2016-08-17 ENCOUNTER — Encounter: Payer: Self-pay | Admitting: Cardiology

## 2016-08-17 VITALS — BP 142/80 | HR 42 | Ht 70.0 in | Wt 219.8 lb

## 2016-08-17 DIAGNOSIS — Z951 Presence of aortocoronary bypass graft: Secondary | ICD-10-CM | POA: Diagnosis not present

## 2016-08-17 DIAGNOSIS — I10 Essential (primary) hypertension: Secondary | ICD-10-CM

## 2016-08-17 DIAGNOSIS — N183 Chronic kidney disease, stage 3 unspecified: Secondary | ICD-10-CM

## 2016-08-17 DIAGNOSIS — I2510 Atherosclerotic heart disease of native coronary artery without angina pectoris: Secondary | ICD-10-CM | POA: Diagnosis not present

## 2016-08-17 DIAGNOSIS — I441 Atrioventricular block, second degree: Secondary | ICD-10-CM | POA: Diagnosis not present

## 2016-08-17 NOTE — Progress Notes (Signed)
Cardiology Office Note   Date:  08/17/2016   ID:  [name] [name], DOB 1931/05/04, MRN [id number]  PCP:  Jani Gravel, MD  Cardiologist:  Dr. Meda Coffee    EP Dr. Rayann Heman  Chief Complaint  Patient presents with  . Shortness of Breath      History of Present Illness: [name] [name] is a 83 y.o. male who presents for follow up of Motiz I second degree AV block.  Plan for conservative management for now.  He was hypotensive on last visit.  Lisinopril stopped.  He has CAD with low risk myoview in 2016 with EF 50%.  Also normal renal arteries.  holter 48 hours in 12/2015 with rates to 25 at night and 33 during the day.  + CKD 3  Today no chest pain and some DOE.  [name] remains low but pt denies syncope occ dizziness.  He is still not ready for PPM, he is afraid of infections.  His daughter developed an infection while in the hospital.  His BP at home is up to 244W sytolic.  At times.  His wife givens him lasix 20 mg maybe once a week, but his SOB has increased.       Past Medical History:  Diagnosis Date  . AAA (abdominal aortic aneurysm) [facility])    Surgical repair June, 2014  . CAD (coronary artery disease)   . Carotid artery occlusion   . Chronic renal insufficiency   . COPD (chronic obstructive pulmonary disease) ([place]) 08/15/2012  . CVA (cerebral vascular accident) ([place])    residual R leg weakness  . Diabetes mellitus without complication ([place])   . Dyslipidemia   . Fall at home Feb.4, 2015   Fx Right Hip  . HTN (hypertension)   . Hx of CABG    2005  . Leg pain   . Morbid obesity ([place])   . Neuropathy    Right foot  . Peripheral vascular disease ([place])   . Renal artery stenosis ([place])   . Status post abdominal aortic aneurysm repair    June, 2014  . Tobacco abuse    quit 2014    Past Surgical History:  Procedure Laterality Date  . ABDOMINAL AORTAGRAM N/A 08/07/2012   Procedure: ABDOMINAL AORTAGRAM;  Surgeon: Rosetta Posner, MD;  Location: [facility] CATH LAB;  Service:  Cardiovascular;  Laterality: N/A;  . AORTA - BILATERAL FEMORAL ARTERY BYPASS GRAFT N/A 08/09/2012   Procedure: AORTA BIFEMORAL BYPASS GRAFT;  Surgeon: Rosetta Posner, MD;  Location: [facility];  Service: Vascular;  Laterality: N/A;  . CORONARY ARTERY BYPASS GRAFT  2005   x4  . HIP ARTHROPLASTY Right 04/09/2013   Procedure: ARTHROPLASTY BIPOLAR HIP;  Surgeon: Gearlean Alf, MD;  Location: [facility];  Service: Orthopedics;  Laterality: Right;  . JOINT REPLACEMENT Right Feb. 4, 2015   Hip- Pt fell  . SPINE SURGERY  1977   Lumbar HNP surgery     Current Outpatient Prescriptions  Medication Sig Dispense Refill  . ANORO ELLIPTA 62.5-25 MCG/INH AEPB Inhale 1 puff into the lungs daily.    Marland Kitchen aspirin 81 MG tablet Take 81 mg by mouth daily.    Marland Kitchen atorvastatin (LIPITOR) 40 MG tablet Take 40 mg by mouth daily.     . cholecalciferol (VITAMIN D) 1000 UNITS tablet Take 1,000 Units by mouth daily.     . clopidogrel (PLAVIX) 75 MG tablet Take 1 tablet by mouth daily.    Marland Kitchen escitalopram (LEXAPRO) 5 MG tablet  Take 5 mg by mouth daily.     . furosemide (LASIX) 20 MG tablet Take 0.5 tablets (10 mg total) by mouth daily as needed for fluid or edema. 30 tablet 3  . gabapentin (NEURONTIN) 300 MG capsule Take 300 mg by mouth daily.    . hydrALAZINE (APRESOLINE) 25 MG tablet Take 1 tablet (25 mg total) by mouth 3 (three) times daily. 270 tablet 2  . lisinopril (PRINIVIL,ZESTRIL) 5 MG tablet Take 1 tablet (5 mg total) by mouth daily. 90 tablet 2  . montelukast (SINGULAIR) 10 MG tablet Take 10 mg by mouth daily.    Marland Kitchen oxybutynin (DITROPAN-XL) 5 MG 24 hr tablet Take 5 mg by mouth daily.     . pioglitazone (ACTOS) 15 MG tablet Take 15 mg by mouth daily.    Marland Kitchen PROAIR HFA 108 (90 BASE) MCG/ACT inhaler Inhale 1 puff into the lungs every 4 (four) hours as needed for wheezing or shortness of breath.     . traMADol (ULTRAM) 50 MG tablet Take 1 tablet by mouth every 6 (six) hours as needed for moderate pain.     . vitamin B-12  (CYANOCOBALAMIN) 100 MCG tablet Take 100 mcg by mouth daily.     Marland Kitchen zolpidem (AMBIEN) 10 MG tablet Take 5 mg by mouth at bedtime as needed for sleep.      No current facility-administered medications for this visit.     Allergies:   Patient has no known allergies.    Social History:  The patient  reports that he quit smoking about 4 years ago. His smoking use included Cigarettes. He has a 25.00 pack-year smoking history. He has never used smokeless tobacco. He reports that he drinks alcohol. He reports that he does not use drugs.   Family History:  The patient's family history includes Coronary artery disease in his father; Tuberculosis (age of onset: 57) in his mother; Varicose Veins in his father.    ROS:  General:no colds or fevers, + weight increase Skin:no rashes or ulcers HEENT:no blurred vision, no congestion CV:see HPI PUL:see HPI GI:no diarrhea constipation or melena, no indigestion GU:no hematuria, no dysuria MS:no joint pain, no claudication Neuro:no syncope, no lightheadedness Endo:+ diabetes, no thyroid disease  Wt Readings from Last 3 Encounters:  08/17/16 219 lb 12.8 oz (99.7 kg)  05/30/16 213 lb (96.6 kg)  04/10/16 216 lb (98 kg)     PHYSICAL EXAM: VS:  BP (!) 142/80 (BP Location: Left Arm, Patient Position: Sitting, Cuff Size: Normal)   Pulse (!) 42   Ht 5\' 10"  (1.778 m)   Wt 219 lb 12.8 oz (99.7 kg)   SpO2 97%   BMI 31.54 kg/m  , BMI Body mass index is 31.54 kg/m. General:Pleasant affect, NAD Skin:Warm and dry, brisk capillary refill HEENT:normocephalic, sclera clear, mucus membranes moist Neck:supple, no JVD, no bruits  Heart:S1S2 RRR without murmur, gallup, rub or click very slow Lungs:clear without rales, rhonchi, or wheezes QIH:KVQQ, non tender, + BS, do not palpate liver spleen or masses Ext:no to tr  lower ext edema, 2+ pedal pulses, 2+ radial pulses Neuro:alert and oriented C 3, MAE, follows commands, + facial symmetry    EKG:  EKG is NOT  ordered today.   Recent Labs: No results found for requested labs within last 8760 hours.    Lipid Panel No results found for: CHOL, TRIG, HDL, CHOLHDL, VLDL, LDLCALC, LDLDIRECT     Other studies Reviewed: Additional studies/ records that were reviewed today include: . NUC study  01/2015 Study Highlights    Nuclear stress EF: 50%.  There was no ST segment deviation noted during stress.  Defect 1: There is a small defect of mild severity present in the apex location.  Defect 2: There is a medium defect of moderate severity present in the basal inferior, basal inferolateral and mid inferior location.  Findings consistent with prior myocardial infarction.  This is a low risk study.  The left ventricular ejection fraction is mildly decreased (45-54%).   Low risk stress nuclear study with fixed apical and inferior perfusion defects, but no reversible ischemia.  Mildly reduced left ventricular global systolic function. Second degree AV block, Mobitz type 1 was recorded both before and after Lexiscan administration.    Holter Study Highlights   Addendum by Thayer Headings, MD on Fri Dec 31, 2015 10:36 AM   NSR with epiosodes of sinus brady  Mobitz 1 AV block  1 Episode of 15 beats of Nonsustained Ventricluar tachycardia  There are episodes of 2:1 block with ventricular HR in the 20s and 30s.  The patient should be considered for pacemaker .    Finalized by Thayer Headings, MD on Tue Dec 28, 2015 1:59 PM   NSR with epiosodes of sinus brady  Mobitz 1 AV block  1 Episode of 15 beats of Nonsustained Ventricluar tachycardia       ASSESSMENT AND PLAN:  1.  Hypertension will check with Dr. Meda Coffee about increasing the hydralazine to 37.5 TID or 50 TID BP has climbed- follow up with Dr. Meda Coffee in 3 months.  2. DOE pt will take lasix 20 mg every other day to see if this will help, check BMP next week  3. CKD 3  Monitor  4.  CAD no pain  5.  Mobitz I second  degree AV block not yet ready for pacer, symptoms are not strong enough yet.   Current medicines are reviewed with the patient today.  The patient Has no concerns regarding medicines.  The following changes have been made:  See above Labs/ tests ordered today include:see above  Disposition:   FU:  see above  Signed, Cecilie Kicks, NP  08/17/2016 5:36 PM    [facility] [facility], [address] [place], [place] Phone: [phone]; Fax: [phone]

## 2016-08-17 NOTE — Patient Instructions (Addendum)
Medication Instructions:   Your physician recommends that you continue on your current medications as directed. Please refer to the Current Medication list given to you today.   If you need a refill on your cardiac medications before your next appointment, please call your pharmacy.  Labwork:  BMET NEXT Friday     Testing/Procedures: NONE ORDERED  TODAY    Follow-Up: WITH DR Meda Coffee  IN 3 MONTHS     Any Other Special Instructions Will Be Listed Below (If Applicable).

## 2016-08-21 DIAGNOSIS — I129 Hypertensive chronic kidney disease with stage 1 through stage 4 chronic kidney disease, or unspecified chronic kidney disease: Secondary | ICD-10-CM | POA: Diagnosis not present

## 2016-08-21 DIAGNOSIS — E1122 Type 2 diabetes mellitus with diabetic chronic kidney disease: Secondary | ICD-10-CM | POA: Diagnosis not present

## 2016-08-21 DIAGNOSIS — R269 Unspecified abnormalities of gait and mobility: Secondary | ICD-10-CM | POA: Diagnosis not present

## 2016-08-21 DIAGNOSIS — I2510 Atherosclerotic heart disease of native coronary artery without angina pectoris: Secondary | ICD-10-CM | POA: Diagnosis not present

## 2016-08-21 DIAGNOSIS — N189 Chronic kidney disease, unspecified: Secondary | ICD-10-CM | POA: Diagnosis not present

## 2016-08-21 DIAGNOSIS — E1151 Type 2 diabetes mellitus with diabetic peripheral angiopathy without gangrene: Secondary | ICD-10-CM | POA: Diagnosis not present

## 2016-08-23 DIAGNOSIS — E1151 Type 2 diabetes mellitus with diabetic peripheral angiopathy without gangrene: Secondary | ICD-10-CM | POA: Diagnosis not present

## 2016-08-23 DIAGNOSIS — I129 Hypertensive chronic kidney disease with stage 1 through stage 4 chronic kidney disease, or unspecified chronic kidney disease: Secondary | ICD-10-CM | POA: Diagnosis not present

## 2016-08-23 DIAGNOSIS — I2510 Atherosclerotic heart disease of native coronary artery without angina pectoris: Secondary | ICD-10-CM | POA: Diagnosis not present

## 2016-08-23 DIAGNOSIS — N189 Chronic kidney disease, unspecified: Secondary | ICD-10-CM | POA: Diagnosis not present

## 2016-08-23 DIAGNOSIS — E1122 Type 2 diabetes mellitus with diabetic chronic kidney disease: Secondary | ICD-10-CM | POA: Diagnosis not present

## 2016-08-23 DIAGNOSIS — R269 Unspecified abnormalities of gait and mobility: Secondary | ICD-10-CM | POA: Diagnosis not present

## 2016-08-25 ENCOUNTER — Other Ambulatory Visit: Payer: Medicare Other

## 2016-08-25 DIAGNOSIS — N183 Chronic kidney disease, stage 3 unspecified: Secondary | ICD-10-CM

## 2016-08-26 LAB — BASIC METABOLIC PANEL
BUN/Creatinine Ratio: 14 (ref 10–24)
BUN: 37 mg/dL — AB (ref 8–27)
CALCIUM: 9 mg/dL (ref 8.6–10.2)
CO2: 21 mmol/L (ref 20–29)
CREATININE: 2.69 mg/dL — AB (ref 0.76–1.27)
Chloride: 105 mmol/L (ref 96–106)
GFR, EST AFRICAN AMERICAN: 24 mL/min/{1.73_m2} — AB (ref >59)
GFR, EST NON AFRICAN AMERICAN: 21 mL/min/{1.73_m2} — AB (ref >59)
Glucose: 94 mg/dL (ref 65–99)
Potassium: 4.6 mmol/L (ref 3.5–5.2)
Sodium: 142 mmol/L (ref 134–144)

## 2016-08-29 ENCOUNTER — Telehealth: Payer: Self-pay

## 2016-08-29 ENCOUNTER — Telehealth: Payer: Self-pay | Admitting: Cardiology

## 2016-08-29 DIAGNOSIS — I10 Essential (primary) hypertension: Secondary | ICD-10-CM

## 2016-08-29 NOTE — Telephone Encounter (Signed)
New message    Ebony from pt doctor office is calling asking for a call back. She states that pt spoke with N[facility] Conn [facility] this morning and was told he needed an appt there but they aren't sure why. Please call.

## 2016-08-29 NOTE — Telephone Encounter (Signed)
Spoke to patient regarding lab results that you had sent back. You advised that patient was to stop taking the lisinopril and the furosemide. Patients wife states that he has been excessively bloated and swollen with being on the prednisone and was told that patient needs to take the furosemide as needed for this. I advised patients wife that she is to not give the patient the medication until I speak to you otherwise. I advised that I would call back with the information.    Patients BP was 149/79 with a pulse of 42 on 6/25 at night time. You advised in the lab results that if the BP was elevated the patient is to increase the apresoline. Should this be increased with this BP reading or does the patient need to stay on the current dosage. Patients wife states that this reading is down compared to what it has been in the past.

## 2016-08-29 NOTE — Telephone Encounter (Signed)
These are first labs in computer for a year so I do not know how long the Cr is elevated. No changes on aprsaline for now and lasix prn if fine.

## 2016-08-29 NOTE — Telephone Encounter (Signed)
Called and spoke to [place], advised her that per Cecilie Kicks NP she is concerned that patient could be having worsening kidney function and would like patient to be seen for evaluation

## 2016-08-29 NOTE — Telephone Encounter (Signed)
Per Cecilie Kicks, NP patient needed to come back in on 6/27 for a repeat BMP lab draw. Called and spoke to patients wife he is scheduled for lab and will be in on this date.

## 2016-08-30 ENCOUNTER — Other Ambulatory Visit: Payer: Medicare Other

## 2016-08-30 NOTE — Telephone Encounter (Signed)
Returned call to [name] [name], per Cecilie Kicks, NP, to advise that we will not make any changes to the Hydralazine at this time and that it was ok for pt to use PRN Lasix.   She was very appreciative and verbalized understanding.  Lisinopril was still on med list, so I dc'd it.

## 2016-08-30 NOTE — Addendum Note (Signed)
Addended by: Gaetano Net on: 08/30/2016 08:46 AM   Modules accepted: Orders

## 2016-09-07 ENCOUNTER — Telehealth: Payer: Self-pay | Admitting: Cardiology

## 2016-09-07 DIAGNOSIS — R0602 Shortness of breath: Secondary | ICD-10-CM | POA: Diagnosis not present

## 2016-09-07 DIAGNOSIS — R05 Cough: Secondary | ICD-10-CM | POA: Diagnosis not present

## 2016-09-07 NOTE — Telephone Encounter (Signed)
New Message   Pt states that he has a confidential blood reading on a lab report that indicates CHF and he requests a call from the nurse

## 2016-09-07 NOTE — Telephone Encounter (Signed)
Pt states he went to see his PCP today for complaints of sob, and they drew labs. Pt states the PA at Dr. Julianne Rice office informed him that he is in HF, with given lab results from today. Dr Maudie Mercury is not in epic, so unable to see exactly what his PA ordered and resulted.  Informed the pt that he does have a hx of CHF, but this is treated with his current regimen.  Asked the pt if his PCP's office was going to treat him for labs ordered and resulted? Pt states that the PA at his PCP office advised him to call Dr Meda Coffee and ask what to do.  Informed the pt that Dr Meda Coffee is out of the office this week and next week.  Informed the pt that given we can't see his OV and lab results his PCP performed on him today, he should call them back now, and inform them that in Dr Francesca Oman absence, they should treat him accordingly, based on results received.  Per the pt, he verbalized understanding and agreed with this plan.  Advised the pt to have them fax their office notes and results to our office, for Dr Meda Coffee to review, upon return to the office.  Informed the pt that I will route this message to Dr Meda Coffee, as a general FYI.  Pt agreed to plan.

## 2016-09-08 ENCOUNTER — Emergency Department (HOSPITAL_COMMUNITY): Payer: Medicare Other

## 2016-09-08 ENCOUNTER — Encounter (HOSPITAL_COMMUNITY): Payer: Self-pay

## 2016-09-08 ENCOUNTER — Inpatient Hospital Stay (HOSPITAL_COMMUNITY)
Admission: EM | Admit: 2016-09-08 | Discharge: 2016-09-12 | DRG: 242 | Disposition: A | Discharge status: Home Health | Payer: Medicare Other | Attending: Cardiology | Admitting: Cardiology

## 2016-09-08 DIAGNOSIS — I495 Sick sinus syndrome: Secondary | ICD-10-CM | POA: Diagnosis not present

## 2016-09-08 DIAGNOSIS — I472 Ventricular tachycardia: Secondary | ICD-10-CM | POA: Diagnosis present

## 2016-09-08 DIAGNOSIS — I248 Other forms of acute ischemic heart disease: Secondary | ICD-10-CM | POA: Diagnosis present

## 2016-09-08 DIAGNOSIS — Z951 Presence of aortocoronary bypass graft: Secondary | ICD-10-CM

## 2016-09-08 DIAGNOSIS — N184 Chronic kidney disease, stage 4 (severe): Secondary | ICD-10-CM | POA: Diagnosis not present

## 2016-09-08 DIAGNOSIS — J449 Chronic obstructive pulmonary disease, unspecified: Secondary | ICD-10-CM | POA: Diagnosis present

## 2016-09-08 DIAGNOSIS — R7989 Other specified abnormal findings of blood chemistry: Secondary | ICD-10-CM

## 2016-09-08 DIAGNOSIS — Z6830 Body mass index (BMI) 30.0-30.9, adult: Secondary | ICD-10-CM

## 2016-09-08 DIAGNOSIS — E785 Hyperlipidemia, unspecified: Secondary | ICD-10-CM | POA: Diagnosis present

## 2016-09-08 DIAGNOSIS — I509 Heart failure, unspecified: Secondary | ICD-10-CM | POA: Diagnosis not present

## 2016-09-08 DIAGNOSIS — Z4682 Encounter for fitting and adjustment of non-vascular catheter: Secondary | ICD-10-CM | POA: Diagnosis not present

## 2016-09-08 DIAGNOSIS — R0609 Other forms of dyspnea: Secondary | ICD-10-CM | POA: Diagnosis not present

## 2016-09-08 DIAGNOSIS — J9600 Acute respiratory failure, unspecified whether with hypoxia or hypercapnia: Secondary | ICD-10-CM | POA: Diagnosis not present

## 2016-09-08 DIAGNOSIS — E1151 Type 2 diabetes mellitus with diabetic peripheral angiopathy without gangrene: Secondary | ICD-10-CM | POA: Diagnosis present

## 2016-09-08 DIAGNOSIS — I701 Atherosclerosis of renal artery: Secondary | ICD-10-CM | POA: Diagnosis present

## 2016-09-08 DIAGNOSIS — I441 Atrioventricular block, second degree: Secondary | ICD-10-CM | POA: Diagnosis not present

## 2016-09-08 DIAGNOSIS — I4430 Unspecified atrioventricular block: Secondary | ICD-10-CM

## 2016-09-08 DIAGNOSIS — E669 Obesity, unspecified: Secondary | ICD-10-CM | POA: Diagnosis present

## 2016-09-08 DIAGNOSIS — R001 Bradycardia, unspecified: Secondary | ICD-10-CM | POA: Diagnosis not present

## 2016-09-08 DIAGNOSIS — Z959 Presence of cardiac and vascular implant and graft, unspecified: Secondary | ICD-10-CM

## 2016-09-08 DIAGNOSIS — I459 Conduction disorder, unspecified: Secondary | ICD-10-CM | POA: Diagnosis not present

## 2016-09-08 DIAGNOSIS — E1122 Type 2 diabetes mellitus with diabetic chronic kidney disease: Secondary | ICD-10-CM | POA: Diagnosis not present

## 2016-09-08 DIAGNOSIS — Z8673 Personal history of transient ischemic attack (TIA), and cerebral infarction without residual deficits: Secondary | ICD-10-CM | POA: Diagnosis not present

## 2016-09-08 DIAGNOSIS — I2510 Atherosclerotic heart disease of native coronary artery without angina pectoris: Secondary | ICD-10-CM | POA: Diagnosis present

## 2016-09-08 DIAGNOSIS — I5033 Acute on chronic diastolic (congestive) heart failure: Secondary | ICD-10-CM | POA: Diagnosis not present

## 2016-09-08 DIAGNOSIS — Z8679 Personal history of other diseases of the circulatory system: Secondary | ICD-10-CM | POA: Diagnosis not present

## 2016-09-08 DIAGNOSIS — Z96641 Presence of right artificial hip joint: Secondary | ICD-10-CM | POA: Diagnosis present

## 2016-09-08 DIAGNOSIS — D649 Anemia, unspecified: Secondary | ICD-10-CM | POA: Diagnosis present

## 2016-09-08 DIAGNOSIS — R791 Abnormal coagulation profile: Secondary | ICD-10-CM | POA: Diagnosis present

## 2016-09-08 DIAGNOSIS — Z8249 Family history of ischemic heart disease and other diseases of the circulatory system: Secondary | ICD-10-CM

## 2016-09-08 DIAGNOSIS — M79609 Pain in unspecified limb: Secondary | ICD-10-CM | POA: Diagnosis not present

## 2016-09-08 DIAGNOSIS — I160 Hypertensive urgency: Secondary | ICD-10-CM | POA: Diagnosis not present

## 2016-09-08 DIAGNOSIS — R0602 Shortness of breath: Secondary | ICD-10-CM

## 2016-09-08 DIAGNOSIS — Z87891 Personal history of nicotine dependence: Secondary | ICD-10-CM

## 2016-09-08 DIAGNOSIS — J9601 Acute respiratory failure with hypoxia: Secondary | ICD-10-CM | POA: Diagnosis not present

## 2016-09-08 DIAGNOSIS — I130 Hypertensive heart and chronic kidney disease with heart failure and stage 1 through stage 4 chronic kidney disease, or unspecified chronic kidney disease: Principal | ICD-10-CM | POA: Diagnosis present

## 2016-09-08 DIAGNOSIS — I110 Hypertensive heart disease with heart failure: Secondary | ICD-10-CM | POA: Diagnosis not present

## 2016-09-08 LAB — BASIC METABOLIC PANEL
Anion gap: 8 (ref 5–15)
BUN: 33 mg/dL — ABNORMAL HIGH (ref 6–20)
CHLORIDE: 109 mmol/L (ref 101–111)
CO2: 22 mmol/L (ref 22–32)
CREATININE: 2.48 mg/dL — AB (ref 0.61–1.24)
Calcium: 9 mg/dL (ref 8.9–10.3)
GFR, EST AFRICAN AMERICAN: 26 mL/min — AB (ref >60)
GFR, EST NON AFRICAN AMERICAN: 23 mL/min — AB (ref >60)
Glucose, Bld: 95 mg/dL (ref 65–99)
POTASSIUM: 4 mmol/L (ref 3.5–5.1)
SODIUM: 139 mmol/L (ref 135–145)

## 2016-09-08 LAB — CBC
HEMATOCRIT: 38.6 % — AB (ref 39.0–52.0)
Hemoglobin: 11.9 g/dL — ABNORMAL LOW (ref 13.0–17.0)
MCH: 30.1 pg (ref 26.0–34.0)
MCHC: 30.8 g/dL (ref 30.0–36.0)
MCV: 97.5 fL (ref 78.0–100.0)
PLATELETS: 210 10*3/uL (ref 150–400)
RBC: 3.96 MIL/uL — AB (ref 4.22–5.81)
RDW: 14.5 % (ref 11.5–15.5)
WBC: 8.1 10*3/uL (ref 4.0–10.5)

## 2016-09-08 LAB — TSH: TSH: 3.351 u[IU]/mL (ref 0.350–4.500)

## 2016-09-08 LAB — PROTIME-INR
INR: 1.1
Prothrombin Time: 14.3 seconds (ref 11.4–15.2)

## 2016-09-08 LAB — HEPATIC FUNCTION PANEL
ALBUMIN: 3.8 g/dL (ref 3.5–5.0)
ALK PHOS: 81 U/L (ref 38–126)
ALT: 17 U/L (ref 17–63)
AST: 21 U/L (ref 15–41)
Bilirubin, Direct: 0.1 mg/dL (ref 0.1–0.5)
Indirect Bilirubin: 0.7 mg/dL (ref 0.3–0.9)
TOTAL PROTEIN: 7.5 g/dL (ref 6.5–8.1)
Total Bilirubin: 0.8 mg/dL (ref 0.3–1.2)

## 2016-09-08 LAB — GLUCOSE, CAPILLARY
GLUCOSE-CAPILLARY: 125 mg/dL — AB (ref 65–99)
Glucose-Capillary: 130 mg/dL — ABNORMAL HIGH (ref 65–99)

## 2016-09-08 LAB — TROPONIN I
TROPONIN I: 0.07 ng/mL — AB (ref <0.03)
Troponin I: 0.06 ng/mL (ref <0.03)

## 2016-09-08 LAB — BRAIN NATRIURETIC PEPTIDE: B NATRIURETIC PEPTIDE 5: 724.6 pg/mL — AB (ref 0.0–100.0)

## 2016-09-08 LAB — I-STAT TROPONIN, ED: Troponin i, poc: 0.05 ng/mL (ref 0.00–0.08)

## 2016-09-08 LAB — MRSA PCR SCREENING: MRSA by PCR: NEGATIVE

## 2016-09-08 LAB — MAGNESIUM: Magnesium: 2 mg/dL (ref 1.7–2.4)

## 2016-09-08 MED ORDER — VITAMIN B-12 100 MCG PO TABS
100.0000 ug | ORAL_TABLET | ORAL | Status: DC
  Administered 2016-09-09 – 2016-09-12 (×4): 100 ug via ORAL
  Filled 2016-09-08 (×5): qty 1

## 2016-09-08 MED ORDER — VITAMIN D 1000 UNITS PO TABS
1000.0000 [IU] | ORAL_TABLET | ORAL | Status: DC
  Administered 2016-09-09 – 2016-09-12 (×4): 1000 [IU] via ORAL
  Filled 2016-09-08 (×4): qty 1

## 2016-09-08 MED ORDER — ASPIRIN 81 MG PO TABS: 81.0000 mg | ORAL_TABLET | ORAL | Status: DC

## 2016-09-08 MED ORDER — WARFARIN SODIUM 2 MG PO TABS
4.0000 mg | ORAL_TABLET | ORAL | Status: AC
  Administered 2016-09-08: 4 mg via ORAL
  Filled 2016-09-08: qty 2

## 2016-09-08 MED ORDER — NITROGLYCERIN IN D5W 200-5 MCG/ML-% IV SOLN
0.0000 ug/min | Freq: Once | INTRAVENOUS | Status: AC
  Administered 2016-09-08: 5 ug/min via INTRAVENOUS
  Filled 2016-09-08: qty 250

## 2016-09-08 MED ORDER — CHLORHEXIDINE GLUCONATE CLOTH 2 % EX PADS
6.0000 | MEDICATED_PAD | Freq: Every day | CUTANEOUS | Status: DC
  Administered 2016-09-09: 6 via TOPICAL

## 2016-09-08 MED ORDER — MONTELUKAST SODIUM 10 MG PO TABS
10.0000 mg | ORAL_TABLET | Freq: Every evening | ORAL | Status: DC
  Administered 2016-09-08 – 2016-09-11 (×4): 10 mg via ORAL
  Filled 2016-09-08 (×4): qty 1

## 2016-09-08 MED ORDER — MUPIROCIN 2 % EX OINT
1.0000 "application " | TOPICAL_OINTMENT | Freq: Two times a day (BID) | CUTANEOUS | Status: DC
  Administered 2016-09-08 – 2016-09-09 (×3): 1 via NASAL
  Filled 2016-09-08: qty 22

## 2016-09-08 MED ORDER — ASPIRIN 81 MG PO CHEW
324.0000 mg | CHEWABLE_TABLET | Freq: Once | ORAL | Status: AC
  Administered 2016-09-08: 243 mg via ORAL
  Filled 2016-09-08: qty 4

## 2016-09-08 MED ORDER — ALBUTEROL SULFATE (2.5 MG/3ML) 0.083% IN NEBU: 2.5000 mg | INHALATION_SOLUTION | RESPIRATORY_TRACT | Status: DC | PRN

## 2016-09-08 MED ORDER — ASPIRIN EC 81 MG PO TBEC: 81.0000 mg | DELAYED_RELEASE_TABLET | Freq: Every day | ORAL | Status: DC

## 2016-09-08 MED ORDER — FUROSEMIDE 10 MG/ML IJ SOLN
60.0000 mg | Freq: Two times a day (BID) | INTRAMUSCULAR | Status: DC
  Administered 2016-09-08 – 2016-09-09 (×2): 60 mg via INTRAVENOUS
  Filled 2016-09-08 (×2): qty 6

## 2016-09-08 MED ORDER — GABAPENTIN 300 MG PO CAPS
300.0000 mg | ORAL_CAPSULE | ORAL | Status: DC
  Administered 2016-09-09 – 2016-09-12 (×4): 300 mg via ORAL
  Filled 2016-09-08 (×4): qty 1

## 2016-09-08 MED ORDER — SODIUM CHLORIDE 0.9% FLUSH: 3.0000 mL | INTRAVENOUS | Status: DC | PRN

## 2016-09-08 MED ORDER — ACETAMINOPHEN 325 MG PO TABS: 650.0000 mg | ORAL_TABLET | ORAL | Status: DC | PRN

## 2016-09-08 MED ORDER — ASPIRIN EC 81 MG PO TBEC
81.0000 mg | DELAYED_RELEASE_TABLET | Freq: Every day | ORAL | Status: DC
  Administered 2016-09-09 – 2016-09-12 (×4): 81 mg via ORAL
  Filled 2016-09-08 (×4): qty 1

## 2016-09-08 MED ORDER — OXYBUTYNIN CHLORIDE ER 5 MG PO TB24
5.0000 mg | ORAL_TABLET | Freq: Every day | ORAL | Status: DC
  Administered 2016-09-08 – 2016-09-11 (×4): 5 mg via ORAL
  Filled 2016-09-08 (×7): qty 1

## 2016-09-08 MED ORDER — SODIUM CHLORIDE 0.9% FLUSH
3.0000 mL | Freq: Two times a day (BID) | INTRAVENOUS | Status: DC
  Administered 2016-09-08 – 2016-09-12 (×6): 3 mL via INTRAVENOUS

## 2016-09-08 MED ORDER — CLOPIDOGREL BISULFATE 75 MG PO TABS
75.0000 mg | ORAL_TABLET | ORAL | Status: DC
  Administered 2016-09-09 – 2016-09-10 (×2): 75 mg via ORAL
  Filled 2016-09-08 (×2): qty 1

## 2016-09-08 MED ORDER — INSULIN ASPART 100 UNIT/ML ~~LOC~~ SOLN: 0.0000 [IU] | Freq: Every day | SUBCUTANEOUS | Status: DC

## 2016-09-08 MED ORDER — HEPARIN BOLUS VIA INFUSION
5000.0000 [IU] | Freq: Once | INTRAVENOUS | Status: AC
  Administered 2016-09-08: 5000 [IU] via INTRAVENOUS
  Filled 2016-09-08: qty 5000

## 2016-09-08 MED ORDER — UMECLIDINIUM-VILANTEROL 62.5-25 MCG/INH IN AEPB
1.0000 | INHALATION_SPRAY | RESPIRATORY_TRACT | Status: DC
  Administered 2016-09-10 – 2016-09-12 (×3): 1 via RESPIRATORY_TRACT
  Filled 2016-09-08 (×2): qty 14

## 2016-09-08 MED ORDER — INSULIN ASPART 100 UNIT/ML ~~LOC~~ SOLN
0.0000 [IU] | Freq: Three times a day (TID) | SUBCUTANEOUS | Status: DC
  Administered 2016-09-08: 1 [IU] via SUBCUTANEOUS
  Administered 2016-09-09: 2 [IU] via SUBCUTANEOUS
  Administered 2016-09-10: 1 [IU] via SUBCUTANEOUS
  Administered 2016-09-10: 2 [IU] via SUBCUTANEOUS
  Administered 2016-09-10 – 2016-09-12 (×4): 1 [IU] via SUBCUTANEOUS

## 2016-09-08 MED ORDER — ESCITALOPRAM OXALATE 10 MG PO TABS
5.0000 mg | ORAL_TABLET | Freq: Every evening | ORAL | Status: DC
  Administered 2016-09-08 – 2016-09-11 (×4): 5 mg via ORAL
  Filled 2016-09-08 (×4): qty 1

## 2016-09-08 MED ORDER — ATORVASTATIN CALCIUM 40 MG PO TABS
40.0000 mg | ORAL_TABLET | ORAL | Status: DC
  Administered 2016-09-09 – 2016-09-12 (×4): 40 mg via ORAL
  Filled 2016-09-08 (×4): qty 1

## 2016-09-08 MED ORDER — HEPARIN (PORCINE) IN NACL 100-0.45 UNIT/ML-% IJ SOLN
1500.0000 [IU]/h | INTRAMUSCULAR | Status: DC
  Administered 2016-09-08: 1500 [IU]/h via INTRAVENOUS
  Filled 2016-09-08: qty 250

## 2016-09-08 MED ORDER — TRAMADOL HCL 50 MG PO TABS: 50.0000 mg | ORAL_TABLET | Freq: Four times a day (QID) | ORAL | Status: DC | PRN

## 2016-09-08 MED ORDER — WARFARIN - PHARMACIST DOSING INPATIENT: Freq: Every day | Status: DC

## 2016-09-08 MED ORDER — NITROGLYCERIN IN D5W 200-5 MCG/ML-% IV SOLN
0.0000 ug/min | INTRAVENOUS | Status: DC
  Administered 2016-09-08: 15 ug/min via INTRAVENOUS

## 2016-09-08 MED ORDER — ZOLPIDEM TARTRATE 5 MG PO TABS
5.0000 mg | ORAL_TABLET | Freq: Every day | ORAL | Status: DC
  Administered 2016-09-08 – 2016-09-11 (×4): 5 mg via ORAL
  Filled 2016-09-08 (×4): qty 1

## 2016-09-08 MED ORDER — ALBUTEROL SULFATE HFA 108 (90 BASE) MCG/ACT IN AERS: 2.0000 | INHALATION_SPRAY | RESPIRATORY_TRACT | Status: DC | PRN

## 2016-09-08 MED ORDER — FUROSEMIDE 10 MG/ML IJ SOLN
60.0000 mg | Freq: Once | INTRAMUSCULAR | Status: AC
  Administered 2016-09-08: 60 mg via INTRAVENOUS
  Filled 2016-09-08: qty 6

## 2016-09-08 MED ORDER — SODIUM CHLORIDE 0.9 % IV SOLN: 250.0000 mL | INTRAVENOUS | Status: DC | PRN

## 2016-09-08 MED ORDER — HYDRALAZINE HCL 50 MG PO TABS
50.0000 mg | ORAL_TABLET | Freq: Three times a day (TID) | ORAL | Status: DC
  Administered 2016-09-08 – 2016-09-12 (×11): 50 mg via ORAL
  Filled 2016-09-08 (×11): qty 1

## 2016-09-08 MED ORDER — ONDANSETRON HCL 4 MG/2ML IJ SOLN: 4.0000 mg | Freq: Four times a day (QID) | INTRAMUSCULAR | Status: DC | PRN

## 2016-09-08 NOTE — Consult Note (Signed)
ELECTROPHYSIOLOGY CONSULT NOTE    Patient ID: [name] [name] MRN: [id number], DOB/AGE: 04-17-1931 83 y.o.  Admit date: 09/08/2016 Date of Consult:   09/08/2016   Primary Physician: Jani Gravel, MD Primary Cardiologist: Meda Coffee Electrophysiologist: Allred  Reason for Consultation: bradycardia  HPI:  [name] [name] is a 83 y.o. male is referred by Dr Stanford Breed for evaluation of bradycardia.  Past medical history is notable for Mobitz I (previously felt to be asymptomatic), obesity, CAD s/p CABG, diabetes, COPD, and prior CVA.  He presented to the ER today with a 2 week history of progressive shortness of breath with exertion. On arrival to the ER, he was found to be in respiratory distress and hypertensive. He has been placed on Bipap and is being diuresed.  Heart rates are noted to be in the 30's at times with 2:1 AV block and EP has been asked to evaluate for treatment options.   He has been noting HR 30-40 over recent weeks, when is normal is 40-50  His BP at home has been 150-70s  He has developed edema.  Denies orthopnea PND or chest pain   He has chronic renal insufficiency   Last echo 01/2015 EF 27-51%, grade 1 diastolic dysfunction, no significant valvular abnormalities.   Myoview Ef 50%   He currently is feeling some improved on Bipap. He denies chest pain, syncope, recent fevers or chills.   Past Medical History:  Diagnosis Date  . AAA (abdominal aortic aneurysm) [facility])    Surgical repair June, 2014  . CAD (coronary artery disease)   . Carotid artery occlusion   . Chronic renal insufficiency   . COPD (chronic obstructive pulmonary disease) ([place]) 08/15/2012  . CVA (cerebral vascular accident) ([place])    residual R leg weakness  . Diabetes mellitus without complication ([place])   . Dyslipidemia   . Fall at home Feb.4, 2015   Fx Right Hip  . HTN (hypertension)   . Hx of CABG    2005  . Leg pain   . Morbid obesity ([place])   . Neuropathy    Right foot  . Peripheral vascular  disease ([place])   . Renal artery stenosis ([place])   . Status post abdominal aortic aneurysm repair    June, 2014  . Tobacco abuse    quit 2014     Surgical History:  Past Surgical History:  Procedure Laterality Date  . ABDOMINAL AORTAGRAM N/A 08/07/2012   Procedure: ABDOMINAL AORTAGRAM;  Surgeon: Rosetta Posner, MD;  Location: [facility] CATH LAB;  Service: Cardiovascular;  Laterality: N/A;  . AORTA - BILATERAL FEMORAL ARTERY BYPASS GRAFT N/A 08/09/2012   Procedure: AORTA BIFEMORAL BYPASS GRAFT;  Surgeon: Rosetta Posner, MD;  Location: [facility];  Service: Vascular;  Laterality: N/A;  . CORONARY ARTERY BYPASS GRAFT  2005   x4  . HIP ARTHROPLASTY Right 04/09/2013   Procedure: ARTHROPLASTY BIPOLAR HIP;  Surgeon: Gearlean Alf, MD;  [facility];  Service: Orthopedics;  Laterality: Right;  . JOINT REPLACEMENT Right Feb. 4, 2015   Hip- Pt fell  . SPINE SURGERY  1977   Lumbar HNP surgery      (Not in a hospital admission)  Inpatient Medications: . insulin aspart  0-5 Units Subcutaneous QHS  . insulin aspart  0-9 Units Subcutaneous TID WC    Allergies: No Known Allergies  Social History   Social History  . Marital status: Married    Spouse name: N/A  . Number of children: N/A  .  Years of education: N/A   Occupational History  . retired    Social History Main Topics  . Smoking status: Former Smoker    Packs/day: 0.50    Years: 50.00    Types: Cigarettes    Quit date: 08/05/2012  . Smokeless tobacco: Never Used  . Alcohol use Yes     Comment: occasionally  . Drug use: No  . Sexual activity: Not on file   Other Topics Concern  . Not on file   Social History Narrative   Pt lives in [place] with spouse.  Retired from [employer].     Family History  Problem Relation Age of Onset  . Tuberculosis Mother 28  . Coronary artery disease Father   . Varicose Veins Father      Review of Systems: All other systems reviewed and are otherwise negative except as noted above.  Physical  Exam: Vitals:   09/08/16 1218 09/08/16 1223 09/08/16 1230 09/08/16 1245  BP: (!) 172/71  (!) 172/75 (!) 181/79  Pulse: (!) 42 (!) 40 (!) 41 (!) 55  Resp: 15 17 19  (!) 21  Temp:      TempSrc:      SpO2: 98% 100% 95% 94%  Weight:      Height:        GEN- The patient is elderly and ill appearing, alert and oriented x 3 today.  Wearing BIPAP HEENT: normocephalic, atraumatic; sclera clear, conjunctiva pink; hearing intact; oropharynx clear; neck supple  JVP > 10cm  Lungs-crackles B 1/2 up  Heart-slow but  Regular rate and rhythm, 2/6 early systolic GI- soft, non-tender, non-distended, bowel sounds present  Extremities- no clubbing, cyanosis 1-2EdemaMS- no significant deformity or atrophy Skin- warm and dry, no rash or lesion Psych- euthymic mood, full affect Neuro- strength and sensation are intact  Labs:  Lab Results  Component Value Date   WBC 8.1 09/08/2016   HGB 11.9 (L) 09/08/2016   HCT 38.6 (L) 09/08/2016   MCV 97.5 09/08/2016   PLT 210 09/08/2016    Recent Labs Lab 09/08/16 1138 09/08/16 1302  NA 139  --   K 4.0  --   CL 109  --   CO2 22  --   BUN 33*  --   CREATININE 2.48*  --   CALCIUM 9.0  --   PROT  --  7.5  BILITOT  --  0.8  ALKPHOS  --  81  ALT  --  17  AST  --  21  GLUCOSE 95  --       Radiology/Studies: Dg Chest 2 View Result Date: 09/08/2016 CLINICAL DATA:  Shortness of breath. EXAM: CHEST  2 VIEW COMPARISON:  09/07/2016. FINDINGS: Prior CABG. Cardiomegaly with bilateral pulmonary interstitial prominence consistent with CHF. No focal alveolar infiltrate. No pleural effusion or pneumothorax. Degenerative changes thoracic spine . IMPRESSION: Prior CABG. Congestive heart failure with bilateral pulmonary interstitial edema . Electronically Signed   By: Marcello Moores  Register   On: 09/08/2016 11:58    EKG:2:1 AV block, rate 37, narrow QRS (personally reviewed)  TELEMETRY: Mobitz I and 2:1 heart block, rates 30-50's (personally reviewed)  Assessment/Plan: 1.   Mobitz I and 2:1 AV block Not clear that this is cause of all of his symptoms Will follow over the weekend for progress after diuresis and improvement in heart failure If after diuresis, he is symptomatic with bradycardia, will likely require pacemaker which he is willing to consider  2.  Acute on chronic diastolic heart failure  Update echo Diuresis per primary team  3.  CKD, stage IV Monitor BMET with diuresis  4.  Elevated D-dimer Has been started on Heparin VQ scan ordered  5.  HTN-urgency Meds already adjusted per primary team    Signed, Chanetta Marshall, NP 09/08/2016 3:06 PM  It is not clear that the heart block and bradycardia are immediately contributory to his CHF  Other possibilities incl.Hypertensive urgency  Ischemia and d dimer is bothersome.  It seems most reasonable to work on reversing acute respiratory difficulties.  He will almost certainly need pacing, to which he is now agreeable as his resting HR have now drifted from  he 40/50 range>>>30/40 range. We will need to assess LV function to determine appropriate pacing strategy--tentativly scheduled for Monday.    He has been started on heparin for elevated d dimer   We will begin coumadin as he is not a great candidate for NOAC with renal insufficiency and pacing implanation bleeding risks are lower on coumadin than heparin

## 2016-09-08 NOTE — ED Provider Notes (Signed)
[facility] DEPT Provider Note   CSN: [id number] Arrival date & time: 09/08/16  1128     History   Chief Complaint Chief Complaint  Patient presents with  . Shortness of Breath    HPI [name] [name] is a 83 y.o. male.  HPI Patient presents with gradually increasing lower extremity swelling and shortness of breath over the last 2 weeks. Has had mild cough without sputum production. No fever or chills. Denies chest pain. Has been on Lasix when necessary. Patient has known history of second-degree heart block and has been recommended for pacemaker by his cardiologist. Past Medical History:  Diagnosis Date  . AAA (abdominal aortic aneurysm) [facility])    Surgical repair June, 2014  . CAD (coronary artery disease)   . Carotid artery occlusion   . Chronic renal insufficiency   . COPD (chronic obstructive pulmonary disease) ([place]) 08/15/2012  . CVA (cerebral vascular accident) ([place])    residual R leg weakness  . Diabetes mellitus without complication ([place])   . Dyslipidemia   . Fall at home Feb.4, 2015   Fx Right Hip  . HTN (hypertension)   . Hx of CABG    2005  . Leg pain   . Morbid obesity ([place])   . Neuropathy    Right foot  . Peripheral vascular disease ([place])   . Renal artery stenosis ([place])   . Status post abdominal aortic aneurysm repair    June, 2014  . Tobacco abuse    quit 2014    Patient Active Problem List   Diagnosis Date Noted  . Acute respiratory failure ([place]) 09/08/2016  . Dizziness 05/30/2016  . CKD (chronic kidney disease) stage 3, GFR 30-59 ml/min 11/02/2014  . Hip fracture, right  S/P hip arthroplasty 04/14/2013  . Insomnia 04/14/2013  . Postoperative anemia due to acute blood loss 04/10/2013  . Renal failure, acute on chronic (HCC) 04/09/2013  . Acute urinary retention 04/09/2013  . Closed displaced fracture of right femoral neck ([place]) 04/08/2013  . DM2 (diabetes mellitus, type 2) ([place]) 04/08/2013  . Aftercare following surgery of the circulatory  system, [place] 09/03/2012  . PAD (peripheral artery disease)--bifemoral bypass graft. 08/20/2012  . Fluid overload 08/20/2012  . Heart block AV second degree - episodic 08/16/2012  . COPD (chronic obstructive pulmonary disease) ([place]) 08/15/2012  . Dyspnea 08/15/2012  . CAD (coronary artery disease)   . Neuropathy   . Essential hypertension   . Dyslipidemia   . CVA (cerebral vascular accident) ([place])   . AAA (abdominal aortic aneurysm) ([place])   . Renal artery stenosis ([place])   . Tobacco abuse   . Hx of CABG   . Ejection fraction     Past Surgical History:  Procedure Laterality Date  . ABDOMINAL AORTAGRAM N/A 08/07/2012   Procedure: ABDOMINAL AORTAGRAM;  Surgeon: Rosetta Posner, MD;  Location: [facility] CATH LAB;  Service: Cardiovascular;  Laterality: N/A;  . AORTA - BILATERAL FEMORAL ARTERY BYPASS GRAFT N/A 08/09/2012   Procedure: AORTA BIFEMORAL BYPASS GRAFT;  Surgeon: Rosetta Posner, MD;  Location: [place];  Service: Vascular;  Laterality: N/A;  . CORONARY ARTERY BYPASS GRAFT  2005   x4  . HIP ARTHROPLASTY Right 04/09/2013   Procedure: ARTHROPLASTY BIPOLAR HIP;  Surgeon: Gearlean Alf, MD;  Location: [facility] ORS;  Service: Orthopedics;  Laterality: Right;  . JOINT REPLACEMENT Right Feb. 4, 2015   Hip- Pt fell  . SPINE SURGERY  1977   Lumbar HNP surgery  Home Medications    Prior to Admission medications   Medication Sig Start Date End Date Taking? Authorizing Provider  ANORO ELLIPTA 62.5-25 MCG/INH AEPB Inhale 1 puff into the lungs every morning.  07/03/16  Yes [provider]  aspirin 81 MG tablet Take 81 mg by mouth every morning.    Yes [provider]  atorvastatin (LIPITOR) 40 MG tablet Take 40 mg by mouth every morning.    Yes [provider]  cholecalciferol (VITAMIN D) 1000 UNITS tablet Take 1,000 Units by mouth every morning.    Yes [provider]  clopidogrel (PLAVIX) 75 MG tablet Take 75 mg by mouth every morning.  05/29/13  Yes [provider]  escitalopram (LEXAPRO) 5 MG tablet Take 5 mg by mouth every evening.  12/08/13  Yes [provider]  furosemide (LASIX) 20 MG tablet Take 0.5 tablets (10 mg total) by mouth daily as needed for fluid or edema. 05/30/16  Yes Dorothy Spark, MD  gabapentin (NEURONTIN) 300 MG capsule Take 300 mg by mouth every morning.    Yes [provider]  hydrALAZINE (APRESOLINE) 25 MG tablet Take 1 tablet (25 mg total) by mouth 3 (three) times daily. 08/10/16 11/08/16 Yes Dorothy Spark, MD  montelukast (SINGULAIR) 10 MG tablet Take 10 mg by mouth every evening.  08/09/16  Yes [provider]  oxybutynin (DITROPAN-XL) 5 MG 24 hr tablet Take 5 mg by mouth at bedtime.  07/01/12  Yes [provider]  pioglitazone (ACTOS) 15 MG tablet Take 15 mg by mouth every evening.    Yes [provider]  PROAIR HFA 108 (90 BASE) MCG/ACT inhaler Inhale 2 puffs into the lungs every 4 (four) hours as needed for wheezing or shortness of breath.  12/22/14  Yes [provider]  TRADJENTA 5 MG TABS tablet Take 5 mg by mouth every morning. 09/04/16  Yes [provider]  traMADol (ULTRAM) 50 MG tablet Take 1 tablet by mouth every 6 (six) hours as needed for moderate pain.  05/28/13  Yes [provider]  vitamin B-12 (CYANOCOBALAMIN) 100 MCG tablet Take 100 mcg by mouth every morning.    Yes [provider]  zolpidem (AMBIEN) 10 MG tablet Take 5 mg by mouth at bedtime.  04/11/13  Yes Robbie Lis, MD    Family History Family History  Problem Relation Age of Onset  . Tuberculosis Mother 37  . Coronary artery disease Father   . Varicose Veins Father     Social History Social History  Substance Use Topics  . Smoking status: Former Smoker    Packs/day: 0.50    Years: 50.00    Types: Cigarettes    Quit date: 08/05/2012  . Smokeless tobacco: Never Used  . Alcohol use Yes     Comment: occasionally     Allergies   Patient has no known  allergies.   Review of Systems Review of Systems  Constitutional: Positive for fatigue. Negative for chills and fever.  Respiratory: Positive for cough, shortness of breath and wheezing. Negative for chest tightness.   Cardiovascular: Positive for leg swelling. Negative for chest pain and palpitations.  Gastrointestinal: Negative for abdominal pain, diarrhea, nausea and vomiting.  Genitourinary: Negative for dysuria and frequency.  Musculoskeletal: Negative for back pain, myalgias, neck pain and neck stiffness.  Skin: Negative for rash and wound.  Neurological: Positive for weakness (generalized). Negative for dizziness, light-headedness, numbness and headaches.  All other systems reviewed and are negative.  Physical Exam Updated Vital Signs BP (!) 167/63 (BP Location: Left Arm)   Pulse (!) 52   Temp 98.6 F (37 C) (Oral)   Resp 18   Ht 5\' 10"  (1.778 m)   Wt 97.4 kg (214 lb 11.7 oz)   SpO2 95%   BMI 30.81 kg/m   Physical Exam  Constitutional: He is oriented to person, place, and time. He appears well-developed and well-nourished. No distress.  HENT:  Head: Normocephalic and atraumatic.  Mouth/Throat: Oropharynx is clear and moist. No oropharyngeal exudate.  Eyes: EOM are normal. Pupils are equal, round, and reactive to light.  Neck: Normal range of motion. Neck supple.  Cardiovascular:  Bradycardia. Irregular.  Pulmonary/Chest: He has wheezes. He has rales.  A few scattered X between wheezes. Crackles in bilateral bases. Mild increased work of breathing.  Abdominal: Soft. Bowel sounds are normal. There is no tenderness. There is no rebound and no guarding.  Musculoskeletal: Normal range of motion. He exhibits edema. He exhibits no tenderness.  1+ bilateral lower extremity edema. Distal pulses are 2+. No calf tenderness.  Neurological: He is alert and oriented to person, place, and time.  Skin: Skin is warm and dry. Capillary refill takes less than 2 seconds. No rash  noted. No erythema.  Psychiatric: He has a normal mood and affect. His behavior is normal.  Nursing note and vitals reviewed.    ED Treatments / Results  Labs (all labs ordered are listed, but only abnormal results are displayed) Labs Reviewed  BASIC METABOLIC PANEL - Abnormal; Notable for the following:       Result Value   BUN 33 (*)    Creatinine, Ser 2.48 (*)    GFR calc non Af Amer 23 (*)    GFR calc Af Amer 26 (*)    All other components within normal limits  CBC - Abnormal; Notable for the following:    RBC 3.96 (*)    Hemoglobin 11.9 (*)    HCT 38.6 (*)    All other components within normal limits  BRAIN NATRIURETIC PEPTIDE - Abnormal; Notable for the following:    B Natriuretic Peptide 724.6 (*)    All other components within normal limits  HEPARIN LEVEL (UNFRACTIONATED) - Abnormal; Notable for the following:    Heparin Unfractionated 1.20 (*)    All other components within normal limits  TROPONIN I - Abnormal; Notable for the following:    Troponin I 0.07 (*)    All other components within normal limits  TROPONIN I - Abnormal; Notable for the following:    Troponin I 0.06 (*)    All other components within normal limits  TROPONIN I - Abnormal; Notable for the following:    Troponin I 0.06 (*)    All other components within normal limits  CBC - Abnormal; Notable for the following:    RBC 3.69 (*)    Hemoglobin 10.9 (*)    HCT 35.8 (*)    All other components within normal limits  BASIC METABOLIC PANEL - Abnormal; Notable for the following:    Glucose, Bld 122 (*)    BUN 37 (*)    Creatinine, Ser 2.80 (*)    Calcium 8.8 (*)    GFR calc non Af Amer 20 (*)    GFR calc Af Amer 23 (*)    All other components within normal limits  GLUCOSE, CAPILLARY - Abnormal; Notable for the following:    Glucose-Capillary 125 (*)    All other components  within normal limits  GLUCOSE, CAPILLARY - Abnormal; Notable for the following:    Glucose-Capillary 130 (*)    All other  components within normal limits  HEPARIN LEVEL (UNFRACTIONATED) - Abnormal; Notable for the following:    Heparin Unfractionated 1.20 (*)    All other components within normal limits  GLUCOSE, CAPILLARY - Abnormal; Notable for the following:    Glucose-Capillary 114 (*)    All other components within normal limits  GLUCOSE, CAPILLARY - Abnormal; Notable for the following:    Glucose-Capillary 156 (*)    All other components within normal limits  GLUCOSE, CAPILLARY - Abnormal; Notable for the following:    Glucose-Capillary 113 (*)    All other components within normal limits  BASIC METABOLIC PANEL - Abnormal; Notable for the following:    Glucose, Bld 118 (*)    BUN 38 (*)    Creatinine, Ser 3.03 (*)    Calcium 8.7 (*)    GFR calc non Af Amer 18 (*)    GFR calc Af Amer 21 (*)    All other components within normal limits  CBC - Abnormal; Notable for the following:    RBC 3.90 (*)    Hemoglobin 11.6 (*)    HCT 37.4 (*)    All other components within normal limits  GLUCOSE, CAPILLARY - Abnormal; Notable for the following:    Glucose-Capillary 189 (*)    All other components within normal limits  MRSA PCR SCREENING  HEPATIC FUNCTION PANEL  MAGNESIUM  TSH  PROTIME-INR  PROTIME-INR  HEPARIN LEVEL (UNFRACTIONATED)  HEPARIN LEVEL (UNFRACTIONATED)  PROTIME-INR  I-STAT TROPOININ, ED    EKG  EKG Interpretation  Date/Time:  Friday September 08 2016 12:38:31 EDT Ventricular Rate:  37 PR Interval:    QRS Duration: 108 QT Interval:  502 QTC Calculation: 394 R Axis:   162 Text Interpretation:  Junctional bradycardia Right axis deviation Low voltage, precordial leads Abnormal R-wave progression, early transition Borderline T abnormalities, inferior leads changed from prior Confirmed by Jola Schmidt [phone]) on 09/09/2016 3:43:26 PM       Radiology Nm Pulmonary Perf And Vent  Result Date: 09/09/2016 CLINICAL DATA:  83 year old male with a history of elevated D-dimer EXAM: NUCLEAR  MEDICINE VENTILATION - PERFUSION LUNG SCAN TECHNIQUE: Ventilation images were obtained in multiple projections using inhaled aerosol Tc-67m DTPA. Perfusion images were obtained in multiple projections after intravenous injection of Tc-16m MAA. RADIOPHARMACEUTICALS:  31.3 mCi Technetium-50m DTPA aerosol inhalation and 4.38 mCi Technetium-40m MAA IV COMPARISON:  None. FINDINGS: Ventilation: Accumulation of radiotracer in the proximal airways, frequently seen with aerosol DTPA. Decreased radiotracer on the ventilation images at the posterior aspect of the left lung evident on left lateral views and left posterior oblique views. There is a matched defect on the perfusion images. Perfusion: Decreased radiotracer on the perfusion images in the posterior left lung evident on left lateral image and left posterior oblique image. No mismatch defect identified. Matched defect appears to represent 2 or 3 segments. Comparison chest x-ray reasonably clear in the left posterior lung. IMPRESSION: Very low probability study for pulmonary embolism. Electronically Signed   By: Corrie Mckusick D.O.   On: 09/09/2016 10:50    Procedures Procedures (including critical care time)  Medications Ordered in ED Medications  umeclidinium-vilanterol (ANORO ELLIPTA) 62.5-25 MCG/INH 1 puff (1 puff Inhalation Given 09/10/16 0831)  montelukast (SINGULAIR) tablet 10 mg (10 mg Oral Given 09/09/16 1729)  hydrALAZINE (APRESOLINE) tablet 50 mg (50 mg Oral Given 09/10/16 1504)  gabapentin (NEURONTIN) capsule 300 mg (300 mg Oral Given 09/10/16 0636)  escitalopram (LEXAPRO) tablet 5 mg (5 mg Oral Given 09/09/16 1729)  clopidogrel (PLAVIX) tablet 75 mg (75 mg Oral Given 09/10/16 0637)  traMADol (ULTRAM) tablet 50 mg (not administered)  zolpidem (AMBIEN) tablet 5 mg (5 mg Oral Given 09/09/16 2139)  oxybutynin (DITROPAN-XL) 24 hr tablet 5 mg (5 mg Oral Given 09/09/16 2150)  cholecalciferol (VITAMIN D) tablet 1,000 Units (1,000 Units Oral Given 09/10/16 0643)    vitamin B-12 (CYANOCOBALAMIN) tablet 100 mcg (100 mcg Oral Given 09/10/16 0714)  atorvastatin (LIPITOR) tablet 40 mg (40 mg Oral Given 09/10/16 0637)  sodium chloride flush (NS) 0.9 % injection 3 mL (3 mLs Intravenous Given 09/10/16 0945)  sodium chloride flush (NS) 0.9 % injection 3 mL (not administered)  0.9 %  sodium chloride infusion (not administered)  acetaminophen (TYLENOL) tablet 650 mg (not administered)  ondansetron (ZOFRAN) injection 4 mg (not administered)  insulin aspart (novoLOG) injection 0-9 Units (1 Units Subcutaneous Given 09/10/16 1249)  insulin aspart (novoLOG) injection 0-5 Units (0 Units Subcutaneous Hold 09/09/16 2200)  nitroGLYCERIN 50 mg in dextrose 5 % 250 mL (0.2 mg/mL) infusion (0 mcg/min Intravenous Stopped 09/09/16 0004)  albuterol (PROVENTIL) (2.5 MG/3ML) 0.083% nebulizer solution 2.5 mg (not administered)  aspirin EC tablet 81 mg (81 mg Oral Given 09/10/16 0944)  furosemide (LASIX) tablet 40 mg (40 mg Oral Given 09/10/16 0944)  heparin injection 5,000 Units (5,000 Units Subcutaneous Given 09/10/16 1502)  furosemide (LASIX) injection 60 mg (60 mg Intravenous Given 09/08/16 1307)  nitroGLYCERIN 50 mg in dextrose 5 % 250 mL (0.2 mg/mL) infusion (0 mcg/min Intravenous Stopping Infusion hung by another clincian 09/08/16 1546)  heparin bolus via infusion 5,000 Units (5,000 Units Intravenous Bolus from Bag 09/08/16 1544)  aspirin chewable tablet 324 mg (243 mg Oral Given 09/08/16 1653)  warfarin (COUMADIN) tablet 4 mg (4 mg Oral Given 09/08/16 2138)  technetium TC 34M diethylenetriame-pentaacetic acid (DTPA) injection 08.1 millicurie (44.8 millicuries Intravenous Given 09/09/16 1036)  technetium albumin aggregated (MAA) injection solution 1.85 millicurie (6.31 millicuries Intravenous Contrast Given 09/09/16 0940)   CRITICAL CARE Performed by: Lita Mains, Carlethia Mesquita Total critical care time: 35 minutes Critical care time was exclusive of separately billable procedures and treating other  patients. Critical care was necessary to treat or prevent imminent or life-threatening deterioration. Critical care was time spent personally by me on the following activities: development of treatment plan with patient and/or surrogate as well as nursing, discussions with consultants, evaluation of patient's response to treatment, examination of patient, obtaining history from patient or surrogate, ordering and performing treatments and interventions, ordering and review of laboratory studies, ordering and review of radiographic studies, pulse oximetry and re-evaluation of patient's condition.  Initial Impression / Assessment and Plan / ED Course  I have reviewed the triage vital signs and the nursing notes.  Pertinent labs & imaging results that were available during my care of the patient were reviewed by me and considered in my medical decision making (see chart for details).     Will start IV Lasix and BiPAP.Patient having bradycardic episodes into the 30s and 40s but blood pressure is stable. Will discuss with cardiology. Discussed with cardiology who will see patient in the emergency department. Increasing blood pressure in the emergency department. Placed on nitroglycerin drip. Cardiology will admit to ICU. Final Clinical Impressions(s) / ED Diagnoses   Final diagnoses:  Acute congestive heart failure, unspecified heart failure type [facility])    New Prescriptions Current  Discharge Medication List       Julianne Rice, MD 09/10/16 [phone]

## 2016-09-08 NOTE — Progress Notes (Signed)
Cards NP Dorene Ar text-paged to notify of critical value: Troponin I 0.07. Pt not in distress, denies CP, denies SOB. No acute EKG changes at this time. Will continue to monitor.  Henreitta Leber, RN 09/08/16

## 2016-09-08 NOTE — Progress Notes (Signed)
Patients says he has no c/o being short of breath at this time. Patient is wearing oxygen set at 3lpm with Sp02=97%. Breath sounds are clear with slightly decreased lower lobes. Bipap at bedside and will place on patient if he become short of breath or has any desaturations.

## 2016-09-08 NOTE — Progress Notes (Signed)
ANTICOAGULATION CONSULT NOTE - Initial Consult  Pharmacy Consult for heparin/warfarin Indication: r/o PE  No Known Allergies  Patient Measurements: Height: 5\' 10"  (676.1 cm) Weight: 204 lb 9.4 oz (92.8 kg) IBW/kg (Calculated) : 73 Heparin Dosing Weight: 92kg  Vital Signs: Temp: 97.8 F (36.6 C) (07/06 1700) Temp Source: Oral (07/06 1700) BP: 137/53 (07/06 2000) Pulse Rate: 40 (07/06 2000)  Labs:  Recent Labs  09/08/16 1138 09/08/16 1801  HGB 11.9*  --   HCT 38.6*  --   PLT 210  --   CREATININE 2.48*  --   TROPONINI  --  0.07*    Estimated Creatinine Clearance: 26.3 mL/min (A) (by C-G formula based on SCr of 2.48 mg/dL (H)).   Medical History: Past Medical History:  Diagnosis Date  . AAA (abdominal aortic aneurysm) [facility])    Surgical repair June, 2014  . CAD (coronary artery disease)   . Carotid artery occlusion   . Chronic renal insufficiency   . COPD (chronic obstructive pulmonary disease) ([place]) 08/15/2012  . CVA (cerebral vascular accident) ([place])    residual R leg weakness  . Diabetes mellitus without complication ([place])   . Dyslipidemia   . Fall at home Feb.4, 2015   Fx Right Hip  . HTN (hypertension)   . Hx of CABG    2005  . Leg pain   . Morbid obesity ([place])   . Neuropathy    Right foot  . Peripheral vascular disease ([place])   . Renal artery stenosis ([place])   . Status post abdominal aortic aneurysm repair    June, 2014  . Tobacco abuse    quit 2014    Medications:  Infusions:  . sodium chloride    . heparin 1,500 Units/hr (09/08/16 1700)  . nitroGLYCERIN 50 mcg/min (09/08/16 1938)    Assessment: 54 yom presented to the ED with SOB. Found to have an elevated d-dimer outpatient PTA. To start empiric IV heparin for r/o PE. VQ scan pending because unable to do CT scan due to renal dysfunction. Baseline H/H slightly low and platelets are WNL. He is not on anticoagulation PTA.   Goal of Therapy:  Heparin level 0.3-0.7 units/ml Monitor platelets  by anticoagulation protocol: Yes   Plan:  Heparin bolus 5000 units IV x 1 Heparin gtt 1500 units/hr Check an 8 hr heparin level Daily heparin level and CBC  Rumbarger, Rande Lawman 09/08/2016,3:21 PM  ADDENDUM:  Pharmacy consulted to start warfarin for likely DVT. Continuing on heparin bridge - day #1/5 overlap. Baseline INR pending. Noted also on asa/plavix. No bleeding documented.   Plan: Heparin at 1500 units/hr F/u baseline INR Warfarin 4mg  PO x 1 tonight Daily INR/heparin level/CBC Monitor s/sx bleeding F/u V/Q scan   Elicia Lamp, PharmD, BCPS Clinical Pharmacist 09/08/2016 9:09 PM

## 2016-09-08 NOTE — ED Notes (Addendum)
RT taking pt off of bipap; pt resp e/u; nad noted. will continue to assess.

## 2016-09-08 NOTE — ED Triage Notes (Signed)
Per Pt, Pt is coming from home with reports of SOB for the past week. Pt has hx fo CHF and was told by MD to come in to the ED. Denies Chest Pain. Wheezing noted upon assessment.

## 2016-09-08 NOTE — Progress Notes (Signed)
Mild elevation in troponin most likely due to Pul. Edema on IV heparin and Vq for AM.

## 2016-09-08 NOTE — Progress Notes (Signed)
Dr Caryl Comes called, gave verbal orders to start pt on Coumadin per Pharmacy and obtain venous doppler study of BLE for likely DVT.   Orders put in. Will continue to monitor.  Henreitta Leber, RN 09/08/16

## 2016-09-08 NOTE — Progress Notes (Signed)
ANTICOAGULATION CONSULT NOTE - Initial Consult  Pharmacy Consult for heparin Indication: r/o PE  No Known Allergies  Patient Measurements: Height: 5\' 10"  (177.8 cm) Weight: 204 lb 9.4 oz (92.8 kg) IBW/kg (Calculated) : 73 Heparin Dosing Weight: 92kg  Vital Signs: Temp: 97.6 F (36.4 C) (07/06 1137) Temp Source: Oral (07/06 1137) BP: 181/79 (07/06 1245) Pulse Rate: 55 (07/06 1245)  Labs:  Recent Labs  09/08/16 1138  HGB 11.9*  HCT 38.6*  PLT 210  CREATININE 2.48*    Estimated Creatinine Clearance: 26.3 mL/min (A) (by C-G formula based on SCr of 2.48 mg/dL (H)).   Medical History: Past Medical History:  Diagnosis Date  . AAA (abdominal aortic aneurysm) [facility])    Surgical repair June, 2014  . CAD (coronary artery disease)   . Carotid artery occlusion   . Chronic renal insufficiency   . COPD (chronic obstructive pulmonary disease) ([place]) 08/15/2012  . CVA (cerebral vascular accident) ([place])    residual R leg weakness  . Diabetes mellitus without complication ([place])   . Dyslipidemia   . Fall at home Feb.4, 2015   Fx Right Hip  . HTN (hypertension)   . Hx of CABG    2005  . Leg pain   . Morbid obesity ([place])   . Neuropathy    Right foot  . Peripheral vascular disease ([place])   . Renal artery stenosis ([place])   . Status post abdominal aortic aneurysm repair    June, 2014  . Tobacco abuse    quit 2014    Medications:  Infusions:  . heparin    . nitroGLYCERIN      Assessment: 89 yom presented to the ED with SOB. Found to have an elevated d-dimer outpatient PTA. To start empiric IV heparin for r/o PE. VQ scan pending because unable to do CT scan due to renal dysfunction. Baseline H/H slightly low and platelets are WNL. He is not on anticoagulation PTA.   Goal of Therapy:  Heparin level 0.3-0.7 units/ml Monitor platelets by anticoagulation protocol: Yes   Plan:  Heparin bolus 5000 units IV x 1 Heparin gtt 1500 units/hr Check an 8 hr heparin level Daily  heparin level and CBC  Clotilde Loth, Rande Lawman 09/08/2016,3:21 PM

## 2016-09-08 NOTE — ED Notes (Signed)
RT at bedside to place pt on bipap

## 2016-09-08 NOTE — H&P (Signed)
Cardiology Admission History and Physical:   Patient ID: [name] [name]; [id number]; September 10, 1931   Admission date: 09/08/2016  Primary Care Provider: Jani Gravel, MD Primary Cardiologist: Dr. Meda Coffee Primary Electrophysiologist:  Dr. Rayann Heman  Chief Complaint:  SOB the last week, HR is 37.   Patient Profile:   [name] [name] is a 83 y.o. male with a history of Mobitz 1.  Pt has not wanted PPM due to fear of infection.  [name] is slowing though.  Now with acute SOB and HR 37.  Yesterday D Dimer 10.86.  History of Present Illness:   [name] [name] presents today with acute HF.    He began with SOB about 2 weeks ago and has progressed.  He was off ACE with elevated Cr.  His lasix was prn and his wife has given a few times.  Today the SOB was worse and PCP instructed to come to ER.     Here placed on BiPAP, lasix 60 mg IV has been given with only 300 cc out.   Today Cr is 2.48, BUN 33,  BNP 724 Troponin 0.05  EKG Mobitz 1 with HR 61 first one  EKG #2 HR 37 with 2:1 block presumed Mobitz 1 T wave inversion in III EKG #3 HR 37 2:1 block and T wave inversion in III  LABS yesterday from PCP with D Dimer 10.86, Cr was 2.7.    He has a history of Motiz I second degree AV block.  Plan for conservative management has been plan.  He was hypotensive on last visit in March Lisinopril stopped.  He has CAD with CABG 2005  X 4 with low risk myoview in 2016 with EF 50%.  Also normal renal arteries.  Holter 48 hours in 12/2015 with rates to 25 at night and 33 during the day.  + CKD 3  Last labs with Cr 2.69 and his ACE had been stopped, lasix prn. Cr 2.4 dating back to 2017 at least.  Hx of aorta- bifem bypass as well.   Today Cr is 2.48, BUN 33,   EKG Mobitz 1 with HR 61 first one  EKG #2 HR 37 with 2:1 block presumed Mobitz 1 T wave inversion in III EKG #3 HR 37 2:1 block and T wave inversion in III  Currently on BiPap feeling some better but has not voided much.  No chest pain.  HR is in 30s frequently.      Past Medical History:  Diagnosis Date  . AAA (abdominal aortic aneurysm) [facility])    Surgical repair June, 2014  . CAD (coronary artery disease)   . Carotid artery occlusion   . Chronic renal insufficiency   . COPD (chronic obstructive pulmonary disease) ([place]) 08/15/2012  . CVA (cerebral vascular accident) ([place])    residual R leg weakness  . Diabetes mellitus without complication ([place])   . Dyslipidemia   . Fall at home Feb.4, 2015   Fx Right Hip  . HTN (hypertension)   . Hx of CABG    2005  . Leg pain   . Morbid obesity ([place])   . Neuropathy    Right foot  . Peripheral vascular disease ([place])   . Renal artery stenosis ([place])   . Status post abdominal aortic aneurysm repair    June, 2014  . Tobacco abuse    quit 2014    Past Surgical History:  Procedure Laterality Date  . ABDOMINAL AORTAGRAM N/A 08/07/2012   Procedure: ABDOMINAL AORTAGRAM;  Surgeon:  Rosetta Posner, MD;  Location: [facility] CATH LAB;  Service: Cardiovascular;  Laterality: N/A;  . AORTA - BILATERAL FEMORAL ARTERY BYPASS GRAFT N/A 08/09/2012   Procedure: AORTA BIFEMORAL BYPASS GRAFT;  Surgeon: Rosetta Posner, MD;  Location: [facility];  Service: Vascular;  Laterality: N/A;  . CORONARY ARTERY BYPASS GRAFT  2005   x4  . HIP ARTHROPLASTY Right 04/09/2013   Procedure: ARTHROPLASTY BIPOLAR HIP;  Surgeon: Gearlean Alf, MD;  Location: [facility] ORS;  Service: Orthopedics;  Laterality: Right;  . JOINT REPLACEMENT Right Feb. 4, 2015   Hip- Pt fell  . SPINE SURGERY  1977   Lumbar HNP surgery     Medications Prior to Admission: Prior to Admission medications   Medication Sig Start Date End Date Taking? Authorizing Provider  ANORO ELLIPTA 62.5-25 MCG/INH AEPB Inhale 1 puff into the lungs every morning.  07/03/16  Yes [provider]  aspirin 81 MG tablet Take 81 mg by mouth every morning.    Yes [provider]  atorvastatin (LIPITOR) 40 MG tablet Take 40 mg by mouth every morning.    Yes [provider]   cholecalciferol (VITAMIN D) 1000 UNITS tablet Take 1,000 Units by mouth every morning.    Yes [provider]  clopidogrel (PLAVIX) 75 MG tablet Take 75 mg by mouth every morning.  05/29/13  Yes [provider]  escitalopram (LEXAPRO) 5 MG tablet Take 5 mg by mouth every evening.  12/08/13  Yes [provider]  furosemide (LASIX) 20 MG tablet Take 0.5 tablets (10 mg total) by mouth daily as needed for fluid or edema. 05/30/16  Yes Dorothy Spark, MD  gabapentin (NEURONTIN) 300 MG capsule Take 300 mg by mouth every morning.    Yes [provider]  hydrALAZINE (APRESOLINE) 25 MG tablet Take 1 tablet (25 mg total) by mouth 3 (three) times daily. 08/10/16 11/08/16 Yes Dorothy Spark, MD  montelukast (SINGULAIR) 10 MG tablet Take 10 mg by mouth every evening.  08/09/16  Yes [provider]  oxybutynin (DITROPAN-XL) 5 MG 24 hr tablet Take 5 mg by mouth at bedtime.  07/01/12  Yes [provider]  pioglitazone (ACTOS) 15 MG tablet Take 15 mg by mouth every evening.    Yes [provider]  PROAIR HFA 108 (90 BASE) MCG/ACT inhaler Inhale 2 puffs into the lungs every 4 (four) hours as needed for wheezing or shortness of breath.  12/22/14  Yes [provider]  TRADJENTA 5 MG TABS tablet Take 5 mg by mouth every morning. 09/04/16  Yes [provider]  traMADol (ULTRAM) 50 MG tablet Take 1 tablet by mouth every 6 (six) hours as needed for moderate pain.  05/28/13  Yes [provider]  vitamin B-12 (CYANOCOBALAMIN) 100 MCG tablet Take 100 mcg by mouth every morning.    Yes [provider]  zolpidem (AMBIEN) 10 MG tablet Take 5 mg by mouth at bedtime.  04/11/13  Yes Robbie Lis, MD     Allergies:   No Known Allergies  Social History:   Social History   Social History  . Marital status: Married    Spouse name: N/A  . Number of children: N/A  . Years of education: N/A   Occupational History  . retired     Social History Main Topics  . Smoking status: Former Smoker    Packs/day: 0.50    Years: 50.00    Types: Cigarettes    Quit date: 08/05/2012  .  Smokeless tobacco: Never Used  . Alcohol use Yes     Comment: occasionally  . Drug use: No  . Sexual activity: Not on file   Other Topics Concern  . Not on file   Social History Narrative   Pt lives in [place] with spouse.  Retired from [employer].    Family History:   The patient's family history includes Coronary artery disease in his father; Tuberculosis (age of onset: 39) in his mother; Varicose Veins in his father.    ROS:  Please see the history of present illness.  All other ROS reviewed and negative.  General:no colds or fevers, + wt loss from office visit. + weakness last 2 days Skin:no rashes or ulcers HEENT:no blurred vision, no congestion CV:see HPI PUL:see HPI GI:no diarrhea constipation or melena, no indigestion GU:no hematuria, no dysuria MS:no joint pain, no claudication Neuro:no syncope, no lightheadedness Endo:+ diabetes, no thyroid disease  Physical Exam/Data:   Vitals:   09/08/16 1218 09/08/16 1223 09/08/16 1230 09/08/16 1245  BP: (!) 172/71  (!) 172/75 (!) 181/79  Pulse: (!) 42 (!) 40 (!) 41 (!) 55  Resp: 15 17 19  (!) 21  Temp:      TempSrc:      SpO2: 98% 100% 95% 94%  Weight:      Height:       No intake or output data in the 24 hours ending 09/08/16 1418 Filed Weights   09/08/16 1144  Weight: 204 lb 9.6 oz (92.8 kg)   Body mass index is 29.36 kg/m.  General:  Well nourished, well developed, sitting up in bed, with BiPAP some SOB HEENT: normal Lymph: no adenopathy Neck: no JVD Endocrine:  No thryomegaly Vascular: No carotid bruits; 1+ pedal pulsses bil, some lower ext edema Cardiac:  normal S1, S2; RRR very slow; no murmur gallup rub or click Lungs:  Breath sounds present to auscultation bilaterally, no wheezing, rhonchi or few rales  Abd: soft, nontender, no hepatomegaly, tight +BS  Ext:  tr bil edema Musculoskeletal:  No deformities, BUE and BLE strength normal and equal Skin: warm and dry  Neuro:  A&O X 3 MAE, follows commands + facial symmetry.  Psych:  Normal affect    EKG:  The ECGs that were done  was personally reviewed see above  Relevant CV Studies: Previous Echo 01/27/15 Study Conclusions  - Left ventricle: The cavity size was normal. Wall thickness was   increased in a pattern of mild LVH. Systolic function was normal.   The estimated ejection fraction was in the range of 55% to 60%.   Wall motion was normal; there were no regional wall motion   abnormalities. Doppler parameters are consistent with abnormal   left ventricular relaxation (grade 1 diastolic dysfunction). - Aortic valve: There was no stenosis. - Mitral valve: Mildly calcified annulus. Mildly calcified leaflets   . There was no significant regurgitation. - Right ventricle: Poorly visualized. The cavity size was normal.   Systolic function was normal. - Pulmonary arteries: No complete TR doppler jet so unable to   estimate PA systolic pressure. - Inferior vena cava: The vessel was normal in size. The   respirophasic diameter changes were in the normal range (>= 50%),   consistent with normal central venous pressure.  Impressions:  - Normal LV size with mild LV hypertrophy. EF 55-60%. Normal RV   size and systolic function. No significant valvular   abnormalities.  ------------------------------------------------------------------- NUC study 01/2015 Study Highlights    Nuclear stress  EF: 50%.  There was no ST segment deviation noted during stress.  Defect 1: There is a small defect of mild severity present in the apex location.  Defect 2: There is a medium defect of moderate severity present in the basal inferior, basal inferolateral and mid inferior location.  Findings consistent with prior myocardial infarction.  This is a low risk study.  The left ventricular ejection  fraction is mildly decreased (45-54%).   Low risk stress nuclear study with fixed apical and inferior perfusion defects, but no reversible ischemia.  Mildly reduced left ventricular global systolic function. Second degree AV block, Mobitz type 1 was recorded both before and after Lexiscan administration.       Holter 12/20/15 Study Highlights   Addendum by Thayer Headings, MD on Fri Dec 31, 2015 10:36 AM   NSR with epiosodes of sinus brady  Mobitz 1 AV block  1 Episode of 15 beats of Nonsustained Ventricluar tachycardia  There are episodes of 2:1 block with ventricular HR in the 20s and 30s.  The patient should be considered for pacemaker .    Finalized by Thayer Headings, MD on Tue Dec 28, 2015 1:59 PM   NSR with epiosodes of sinus brady  Mobitz 1 AV block  1 Episode of 15 beats of Nonsustained Ventricluar tachycardia       Laboratory Data:  Chemistry  Recent Labs Lab 09/08/16 1138  NA 139  K 4.0  CL 109  CO2 22  GLUCOSE 95  BUN 33*  CREATININE 2.48*  CALCIUM 9.0  GFRNONAA 23*  GFRAA 26*  ANIONGAP 8     Recent Labs Lab 09/08/16 1302  PROT 7.5  ALBUMIN 3.8  AST 21  ALT 17  ALKPHOS 81  BILITOT 0.8   Hematology  Recent Labs Lab 09/08/16 1138  WBC 8.1  RBC 3.96*  HGB 11.9*  HCT 38.6*  MCV 97.5  MCH 30.1  MCHC 30.8  RDW 14.5  PLT 210   Cardiac EnzymesNo results for input(s): TROPONINI in the last 168 hours.   Recent Labs Lab 09/08/16 1148  TROPIPOC 0.05    BNP  Recent Labs Lab 09/08/16 1302  BNP 724.6*    DDimer No results for input(s): DDIMER in the last 168 hours.  Radiology/Studies:  Dg Chest 2 View  Result Date: 09/08/2016 CLINICAL DATA:  Shortness of breath. EXAM: CHEST  2 VIEW COMPARISON:  09/07/2016. FINDINGS: Prior CABG. Cardiomegaly with bilateral pulmonary interstitial prominence consistent with CHF. No focal alveolar infiltrate. No pleural effusion or pneumothorax. Degenerative changes thoracic spine .  IMPRESSION: Prior CABG. Congestive heart failure with bilateral pulmonary interstitial edema . Electronically Signed   By: Marcello Moores  Register   On: 09/08/2016 11:58    Assessment and Plan:   1. Respiratory failure due to Acute CHF - may be due to low HR.  He has elevated DDimer from labs on the 5th.   Previous Echo with normal EF.  will admit- to step down and continue to diuresis. Serial troponin.  Echo.  Lasix 60 mg BID IV.  2.  2.  Elevated ddimer will add IV heparin, plan for VQ once he can tolerate the study.   3. Mobitz I with increasing lower HR.  Will get EP consult -  4.  CAD with hx CABG 2005  5.  PAD with FEM bypass bil.   6. HLD. Continue statin  7.  HTN, urgency will increase hydralazine and add amlodipine  8.  CKD stage 4  Severity of Illness: The appropriate patient status for this patient is INPATIENT. Inpatient status is judged to be reasonable and necessary in order to provide the required intensity of service to ensure the patient's safety. The patient's presenting symptoms, physical exam findings, and initial radiographic and laboratory data in the context of their chronic comorbidities is felt to place them at high risk for further clinical deterioration. Furthermore, it is not anticipated that the patient will be medically stable for discharge from the hospital within 2 midnights of admission. The following factors support the patient status of inpatient.   " The patient's presenting symptoms include respiratory failure. " The worrisome physical exam findings include severe  SOB , severe bradycardia. " The initial radiographic and laboratory data are worrisome because of elevated ddimer, possible PE, this lab was yesterday at PCP,  + pul. Edema. .  . " The chronic co-morbidities include mobitz 1, diastolic HF, CAD s/p CABG, CKD 4, PAD.    * I certify that at the point of admission it is my clinical judgment that the patient will require inpatient hospital care  spanning beyond 2 midnights from the point of admission due to high intensity of service, high risk for further deterioration and high frequency of surveillance required.*    Signed, Cecilie Kicks, NP  09/08/2016 2:18 PM   As above, patient seen and examined. Briefly he is an 83 year old male with past medical history of Mobitz 1 second-degree AV block, coronary artery disease, chronic stage IV kidney disease, COPD, diabetes mellitus, hyperlipidemia, hypertension, renal artery stenosis with acute on chronic diastolic congestive heart failure and bradycardia. Last echocardiogram November 2016 showed ejection fraction 17-49%, mild diastolic dysfunction. Last renal Dopplers December 2016 showed no renal artery stenosis. Pacemaker was considered previously the patient was concerned about risk of infection and declined. Conservative measures were followed at that point. Patient presents with 3 weeks of progressive increased dyspnea on exertion and lower extremity edema. There was no chest pain. There is no syncope. He complains of progressive fatigue.  Chest x-ray shows congestive heart failure. Laboratories show creatinine 2.48, BNP 724 and hemoglobin 11.9. Electrocardiogram shows 2-1 AV block, with a lead reversal and nonspecific ST changes. Heart rate in the 30s.  1 acute diastolic congestive heart failure-patient is presently on BiPAP and is in pulmonary edema. Will plan to repeat echocardiogram. Diurese with Lasix 60 mg IV twice a day. Follow renal function closely.  2 2:1 AV block-patient has a history of Mobitz 1 second-degree AV block. He declined pacemaker previously as he was concerned about risk of infection. I think his arrhythmia is contributing to his congestive heart failure and fatigue and I think he would benefit from a pacemaker. He is agreeable. We will ask electrophysiology to review.  3 chronic stage IV kidney disease-follow renal function closely with diuresis. Last renal Dopplers showed  no renal artery stenosis.  4 hypertension-blood pressure is elevated. Increase hydralazine to 50 mg by mouth 3 times a day and advance as needed. Avoid AV nodal blocking agents. No ACE I given severity of renal insuff.  5 elevated d-dimer-Checked yesterday at primary care office. Noted to be 10.8. Clinical significance uncertain in the setting of congestive heart failure/renal insufficiency. Will treat with IV heparin for now. Proceed with VQ scan once pulmonary edema improves. No CTA given renal insufficiency.   6 coronary artery disease-continue aspirin and statin. Cycle enzymes.  Kirk Ruths, MD

## 2016-09-09 ENCOUNTER — Inpatient Hospital Stay (HOSPITAL_COMMUNITY): Payer: Medicare Other

## 2016-09-09 DIAGNOSIS — J9601 Acute respiratory failure with hypoxia: Secondary | ICD-10-CM

## 2016-09-09 DIAGNOSIS — M79609 Pain in unspecified limb: Secondary | ICD-10-CM

## 2016-09-09 LAB — BASIC METABOLIC PANEL
Anion gap: 8 (ref 5–15)
BUN: 37 mg/dL — AB (ref 6–20)
CHLORIDE: 105 mmol/L (ref 101–111)
CO2: 25 mmol/L (ref 22–32)
Calcium: 8.8 mg/dL — ABNORMAL LOW (ref 8.9–10.3)
Creatinine, Ser: 2.8 mg/dL — ABNORMAL HIGH (ref 0.61–1.24)
GFR calc Af Amer: 23 mL/min — ABNORMAL LOW (ref >60)
GFR calc non Af Amer: 20 mL/min — ABNORMAL LOW (ref >60)
Glucose, Bld: 122 mg/dL — ABNORMAL HIGH (ref 65–99)
POTASSIUM: 3.9 mmol/L (ref 3.5–5.1)
SODIUM: 138 mmol/L (ref 135–145)

## 2016-09-09 LAB — CBC
HEMATOCRIT: 35.8 % — AB (ref 39.0–52.0)
HEMOGLOBIN: 10.9 g/dL — AB (ref 13.0–17.0)
MCH: 29.5 pg (ref 26.0–34.0)
MCHC: 30.4 g/dL (ref 30.0–36.0)
MCV: 97 fL (ref 78.0–100.0)
Platelets: 170 10*3/uL (ref 150–400)
RBC: 3.69 MIL/uL — AB (ref 4.22–5.81)
RDW: 14.3 % (ref 11.5–15.5)
WBC: 9.1 10*3/uL (ref 4.0–10.5)

## 2016-09-09 LAB — GLUCOSE, CAPILLARY
GLUCOSE-CAPILLARY: 114 mg/dL — AB (ref 65–99)
Glucose-Capillary: 156 mg/dL — ABNORMAL HIGH (ref 65–99)
Glucose-Capillary: 113 mg/dL — ABNORMAL HIGH (ref 65–99)
Glucose-Capillary: 189 mg/dL — ABNORMAL HIGH (ref 65–99)

## 2016-09-09 LAB — ECHOCARDIOGRAM COMPLETE
FS: 35 % (ref 28–44)
HEIGHTINCHES: 70 in
IVS/LV PW RATIO, ED: 0.82
LA diam end sys: 47 mm
LA vol A4C: 66.2 ml
LADIAMINDEX: 2.19 cm/m2
LASIZE: 47 mm
LAVOL: 70.5 mL
LAVOLIN: 32.8 mL/m2
LDCA: 3.14 cm2
LV PW d: 12.8 mm — AB (ref 0.6–1.1)
LVOT diameter: 20 mm
TDI e' medial: 5.84
WEIGHTICAEL: 3440 [oz_av]

## 2016-09-09 LAB — TROPONIN I: Troponin I: 0.06 ng/mL (ref <0.03)

## 2016-09-09 LAB — HEPARIN LEVEL (UNFRACTIONATED)
Heparin Unfractionated: 0.55 IU/mL (ref 0.30–0.70)
Heparin Unfractionated: 1.2 IU/mL — ABNORMAL HIGH (ref 0.30–0.70)
Heparin Unfractionated: 1.2 IU/mL — ABNORMAL HIGH (ref 0.30–0.70)

## 2016-09-09 LAB — PROTIME-INR
INR: 1.17
PROTHROMBIN TIME: 15 s (ref 11.4–15.2)

## 2016-09-09 MED ORDER — TECHNETIUM TC 99M DIETHYLENETRIAME-PENTAACETIC ACID
31.3000 | Freq: Once | INTRAVENOUS | Status: AC | PRN
  Administered 2016-09-09: 31.3 via INTRAVENOUS

## 2016-09-09 MED ORDER — TECHNETIUM TO 99M ALBUMIN AGGREGATED
4.3800 | Freq: Once | INTRAVENOUS | Status: AC | PRN
  Administered 2016-09-09: 4.38 via INTRAVENOUS

## 2016-09-09 MED ORDER — HEPARIN (PORCINE) IN NACL 100-0.45 UNIT/ML-% IJ SOLN
900.0000 [IU]/h | INTRAMUSCULAR | Status: DC
  Administered 2016-09-10: 900 [IU]/h via INTRAVENOUS
  Filled 2016-09-09: qty 250

## 2016-09-09 MED ORDER — HEPARIN (PORCINE) IN NACL 100-0.45 UNIT/ML-% IJ SOLN
1300.0000 [IU]/h | INTRAMUSCULAR | Status: DC
  Administered 2016-09-09: 1300 [IU]/h via INTRAVENOUS

## 2016-09-09 MED ORDER — FUROSEMIDE 40 MG PO TABS
40.0000 mg | ORAL_TABLET | Freq: Two times a day (BID) | ORAL | Status: DC
  Administered 2016-09-09: 40 mg via ORAL
  Filled 2016-09-09: qty 1

## 2016-09-09 NOTE — Progress Notes (Signed)
Communicated with Cherrie Speagale Infectious Control RN, advised pt does not need to be on precautions at present time. MD updated

## 2016-09-09 NOTE — Progress Notes (Signed)
ANTICOAGULATION CONSULT NOTE - Follow Up Consult  Pharmacy Consult for Heparin Indication: Possible DVT and/or PE  No Known Allergies  Patient Measurements: Height: 5\' 10"  (177.8 cm) Weight: 215 lb (97.5 kg) IBW/kg (Calculated) : 73  Vital Signs: Temp: 97.8 F (36.6 C) (07/07 1100) Temp Source: Oral (07/07 1100) BP: 153/69 (07/07 0800) Pulse Rate: 37 (07/07 0800)  Labs:  Recent Labs  09/08/16 1138 09/08/16 1801 09/08/16 2122 09/08/16 2335 09/09/16 0435 09/09/16 1013  HGB 11.9*  --   --   --  10.9*  --   HCT 38.6*  --   --   --  35.8*  --   PLT 210  --   --   --  170  --   LABPROT  --   --  14.3  --  15.0  --   INR  --   --  1.10  --  1.17  --   HEPARINUNFRC  --   --   --  1.20*  --  1.20*  CREATININE 2.48*  --   --   --  2.80*  --   TROPONINI  --  0.07* 0.06*  --  0.06*  --     Estimated Creatinine Clearance: 23.8 mL/min (A) (by C-G formula based on SCr of 2.8 mg/dL (H)).   Assessment: 83 y/o M on heparin/warfarin overlap while awaiting dopplers to assess for DVT and VQ scan to assess for PE.   Follow up heparin level is still elevated at 1.2 after rate decrease. No issues per RN. CBC stable overnight. Preliminary dopplers negative for DVT, warfarin stopped, if final dopplers are negative plan is to stop heparin.  Goal of Therapy:  Heparin level 0.3-0.7 units/ml Monitor platelets by anticoagulation protocol: Yes   Plan:  -Hold heparin x 1 hour -Dec heparin to 900 units/hr -F/U dopplers  Erin Hearing PharmD., BCPS Clinical Pharmacist Pager [phone] 09/09/2016 2:10 PM

## 2016-09-09 NOTE — Progress Notes (Signed)
ANTICOAGULATION CONSULT NOTE - Follow Up Consult  Pharmacy Consult for Heparin Indication: Possible DVT and/or PE  No Known Allergies  Patient Measurements: Height: 5\' 10"  (177.8 cm) Weight: 204 lb 9.4 oz (92.8 kg) IBW/kg (Calculated) : 73  Vital Signs: Temp: 97.8 F (36.6 C) (07/06 2345) Temp Source: Oral (07/06 2345) BP: 105/48 (07/07 0000) Pulse Rate: 45 (07/07 0000)  Labs:  Recent Labs  09/08/16 1138 09/08/16 1801 09/08/16 2122 09/08/16 2335  HGB 11.9*  --   --   --   HCT 38.6*  --   --   --   PLT 210  --   --   --   LABPROT  --   --  14.3  --   INR  --   --  1.10  --   HEPARINUNFRC  --   --   --  1.20*  CREATININE 2.48*  --   --   --   TROPONINI  --  0.07* 0.06*  --     Estimated Creatinine Clearance: 26.3 mL/min (A) (by C-G formula based on SCr of 2.48 mg/dL (H)).   Assessment: 83 y/o M on heparin/warfarin overlap while awaiting dopplers to assess for DVT and VQ scan to assess for PE. Initial heparin level is elevated. No issues per RN.   Goal of Therapy:  Heparin level 0.3-0.7 units/ml Monitor platelets by anticoagulation protocol: Yes   Plan:  -Hold heparin x 30 minutes -Dec heparin to 1300 units/hr to start at 0115 -0900 HL -F/U dopplers and VQ results   Narda Bonds 09/09/2016,12:42 AM

## 2016-09-09 NOTE — Progress Notes (Signed)
Progress Note  Patient Name: [name] [name] Date of Encounter: 09/09/2016  Primary Cardiologist:   Dr. Meda Coffee, Dr. Rayann Heman  Subjective   Breathing better on 2 liters.  No pain.    Inpatient Medications    Scheduled Meds: . aspirin EC  81 mg Oral Daily  . atorvastatin  40 mg Oral BH-q7a  . Chlorhexidine Gluconate Cloth  6 each Topical Q0600  . cholecalciferol  1,000 Units Oral BH-q7a  . clopidogrel  75 mg Oral BH-q7a  . escitalopram  5 mg Oral QPM  . furosemide  60 mg Intravenous BID  . gabapentin  300 mg Oral BH-q7a  . hydrALAZINE  50 mg Oral TID  . insulin aspart  0-5 Units Subcutaneous QHS  . insulin aspart  0-9 Units Subcutaneous TID WC  . montelukast  10 mg Oral QPM  . mupirocin ointment  1 application Nasal BID  . oxybutynin  5 mg Oral QHS  . sodium chloride flush  3 mL Intravenous Q12H  . umeclidinium-vilanterol  1 puff Inhalation BH-q7a  . vitamin B-12  100 mcg Oral BH-q7a  . Warfarin - Pharmacist Dosing Inpatient   Does not apply q1800  . zolpidem  5 mg Oral QHS   Continuous Infusions: . sodium chloride    . heparin 1,300 Units/hr (09/09/16 0700)  . nitroGLYCERIN Stopped (09/09/16 0004)   PRN Meds: sodium chloride, acetaminophen, albuterol, ondansetron (ZOFRAN) IV, sodium chloride flush, traMADol   Vital Signs    Vitals:   09/09/16 0645 09/09/16 0700 09/09/16 0715 09/09/16 0800  BP: (!) 156/69 (!) 148/60 (!) 141/60 (!) 153/69  Pulse: (!) 46 78 (!) 45 (!) 37  Resp: 18 15 18 14   Temp:      TempSrc:      SpO2: 98% 95% 93% 97%  Weight:      Height:        Intake/Output Summary (Last 24 hours) at 09/09/16 0945 Last data filed at 09/09/16 0800  Gross per 24 hour  Intake           378.28 ml  Output             2475 ml  Net         -2096.72 ml   Filed Weights   09/08/16 1144 09/08/16 1500 09/09/16 0500  Weight: 204 lb 9.6 oz (92.8 kg) 204 lb 9.4 oz (92.8 kg) 215 lb (97.5 kg)    Telemetry    NSR with Mobitz I with episodes of 2:1 heart block. -  Personally Reviewed  ECG    NA - Personally Reviewed  Physical Exam   GEN: No acute distress.   Neck: No  JVD Cardiac: RRR, no murmurs, rubs, or gallops.  Respiratory: Clear  to auscultation bilaterally. GI: Soft, nontender, non-distended  MS: No  edema; No deformity. Neuro:  Nonfocal  Psych: Normal affect   Labs    Chemistry Recent Labs Lab 09/08/16 1138 09/08/16 1302 09/09/16 0435  NA 139  --  138  K 4.0  --  3.9  CL 109  --  105  CO2 22  --  25  GLUCOSE 95  --  122*  BUN 33*  --  37*  CREATININE 2.48*  --  2.80*  CALCIUM 9.0  --  8.8*  PROT  --  7.5  --   ALBUMIN  --  3.8  --   AST  --  21  --   ALT  --  17  --  ALKPHOS  --  81  --   BILITOT  --  0.8  --   GFRNONAA 23*  --  20*  GFRAA 26*  --  23*  ANIONGAP 8  --  8     Hematology Recent Labs Lab 09/08/16 1138 09/09/16 0435  WBC 8.1 9.1  RBC 3.96* 3.69*  HGB 11.9* 10.9*  HCT 38.6* 35.8*  MCV 97.5 97.0  MCH 30.1 29.5  MCHC 30.8 30.4  RDW 14.5 14.3  PLT 210 170    Cardiac Enzymes Recent Labs Lab 09/08/16 1801 09/08/16 2122 09/09/16 0435  TROPONINI 0.07* 0.06* 0.06*    Recent Labs Lab 09/08/16 1148  TROPIPOC 0.05     BNP Recent Labs Lab 09/08/16 1302  BNP 724.6*     DDimer No results for input(s): DDIMER in the last 168 hours.   Radiology    Dg Chest 2 View  Result Date: 09/08/2016 CLINICAL DATA:  Shortness of breath. EXAM: CHEST  2 VIEW COMPARISON:  09/07/2016. FINDINGS: Prior CABG. Cardiomegaly with bilateral pulmonary interstitial prominence consistent with CHF. No focal alveolar infiltrate. No pleural effusion or pneumothorax. Degenerative changes thoracic spine . IMPRESSION: Prior CABG. Congestive heart failure with bilateral pulmonary interstitial edema . Electronically Signed   By: Marcello Moores  Register   On: 09/08/2016 11:58    Cardiac Studies   Echo:  Pending.   Patient Profile     83 y.o. male with a history of Mobitz 1.  Pt has not wanted PPM due to fear of  infection.  [name] is slowing though.  Now with acute SOB and HR 37.  Yesterday D Dimer 10.86.  Assessment & Plan   ACUTE SYSTOLIC HF:  Down to 2 liters since admission.  Breathing is improved.    ELEVATED D DIMER:   On heparin and warfarin started this admission by Dr. Caryl Comes for likely DVT.  Very low prob for PE on VQ and prelim no evidence of DVT.  Will stop warfarin.  Stop heparin if final lower ext Doppler is negative.   CAD:    Medical management.    HTN:    BP is reasonably controlled.  Hydralazine increased and amlodipine added.  No change in therapy.   CKD STAGE IV:    Creat is up slightly from baseline.  Continue to follow.  I am going to change to PO Lasix.   MOBITZ 1 :   EP consulted and plan to reevaluate on Monday for consideration of whether pacing might be needed.    Signed, Minus Breeding, MD  09/09/2016, 9:45 AM

## 2016-09-09 NOTE — Progress Notes (Signed)
  Echocardiogram 2D Echocardiogram has been performed.  [name] [name] 09/09/2016, 12:40 PM

## 2016-09-09 NOTE — Progress Notes (Signed)
ANTICOAGULATION CONSULT NOTE  Pharmacy Consult for Heparin Indication: Possible DVT and/or PE  No Known Allergies  Patient Measurements: Height: 5\' 10"  (177.8 cm) Weight: 215 lb (97.5 kg) IBW/kg (Calculated) : 73  Vital Signs: Temp: 98.4 F (36.9 C) (07/07 1956) Temp Source: Oral (07/07 1956) BP: 146/53 (07/07 2100) Pulse Rate: 75 (07/07 2100)  Labs:  Recent Labs  09/08/16 1138 09/08/16 1801 09/08/16 2122 09/08/16 2335 09/09/16 0435 09/09/16 1013 09/09/16 2237  HGB 11.9*  --   --   --  10.9*  --   --   HCT 38.6*  --   --   --  35.8*  --   --   PLT 210  --   --   --  170  --   --   LABPROT  --   --  14.3  --  15.0  --   --   INR  --   --  1.10  --  1.17  --   --   HEPARINUNFRC  --   --   --  1.20*  --  1.20* 0.55  CREATININE 2.48*  --   --   --  2.80*  --   --   TROPONINI  --  0.07* 0.06*  --  0.06*  --   --     Estimated Creatinine Clearance: 23.8 mL/min (A) (by C-G formula based on SCr of 2.8 mg/dL (H)).   Assessment: 83 y/o Male with poss DVT for heparin Goal of Therapy:  Heparin level 0.3-0.7 units/ml Monitor platelets by anticoagulation protocol: Yes   Plan:  Continue Heparin at current rate  Phillis Knack, PharmD, BCPS  09/09/2016 11:12 PM

## 2016-09-09 NOTE — Progress Notes (Signed)
*  PRELIMINARY RESULTS* Vascular Ultrasound Bilateral lower extremity venous duplex has been completed.  Preliminary findings: No evidence of deep vein thrombosis in the visualized veins of the lower extremities.  Somewhat difficult exam due to poor patient position and poor visualization of vessels due to arterial shadowing.  Negative for baker's cysts bilaterally.   Myrtie Cruise Towanna Avery 09/09/2016, 11:08 AM

## 2016-09-10 LAB — BASIC METABOLIC PANEL
ANION GAP: 9 (ref 5–15)
BUN: 38 mg/dL — ABNORMAL HIGH (ref 6–20)
CO2: 26 mmol/L (ref 22–32)
Calcium: 8.7 mg/dL — ABNORMAL LOW (ref 8.9–10.3)
Chloride: 102 mmol/L (ref 101–111)
Creatinine, Ser: 3.03 mg/dL — ABNORMAL HIGH (ref 0.61–1.24)
GFR calc Af Amer: 21 mL/min — ABNORMAL LOW (ref >60)
GFR calc non Af Amer: 18 mL/min — ABNORMAL LOW (ref >60)
GLUCOSE: 118 mg/dL — AB (ref 65–99)
POTASSIUM: 3.5 mmol/L (ref 3.5–5.1)
Sodium: 137 mmol/L (ref 135–145)

## 2016-09-10 LAB — GLUCOSE, CAPILLARY
GLUCOSE-CAPILLARY: 145 mg/dL — AB (ref 65–99)
GLUCOSE-CAPILLARY: 126 mg/dL — AB (ref 65–99)
Glucose-Capillary: 169 mg/dL — ABNORMAL HIGH (ref 65–99)

## 2016-09-10 LAB — PROTIME-INR
INR: 1.15
Prothrombin Time: 14.8 seconds (ref 11.4–15.2)

## 2016-09-10 LAB — CBC
HCT: 37.4 % — ABNORMAL LOW (ref 39.0–52.0)
HEMOGLOBIN: 11.6 g/dL — AB (ref 13.0–17.0)
MCH: 29.7 pg (ref 26.0–34.0)
MCHC: 31 g/dL (ref 30.0–36.0)
MCV: 95.9 fL (ref 78.0–100.0)
Platelets: 191 10*3/uL (ref 150–400)
RBC: 3.9 MIL/uL — AB (ref 4.22–5.81)
RDW: 14.3 % (ref 11.5–15.5)
WBC: 9.2 10*3/uL (ref 4.0–10.5)

## 2016-09-10 LAB — HEPARIN LEVEL (UNFRACTIONATED): Heparin Unfractionated: 0.47 IU/mL (ref 0.30–0.70)

## 2016-09-10 MED ORDER — HEPARIN SODIUM (PORCINE) 5000 UNIT/ML IJ SOLN
5000.0000 [IU] | Freq: Three times a day (TID) | INTRAMUSCULAR | Status: DC
  Administered 2016-09-10 (×2): 5000 [IU] via SUBCUTANEOUS
  Filled 2016-09-10 (×3): qty 1

## 2016-09-10 MED ORDER — FUROSEMIDE 40 MG PO TABS
40.0000 mg | ORAL_TABLET | Freq: Every day | ORAL | Status: DC
  Administered 2016-09-10 – 2016-09-12 (×3): 40 mg via ORAL
  Filled 2016-09-10 (×3): qty 1

## 2016-09-10 NOTE — Progress Notes (Signed)
Progress Note  Patient Name: [name] [name] Date of Encounter: 09/10/2016  Primary Cardiologist:   Dr. Meda Coffee, Dr. Rayann Heman  Subjective   Breathing he thinks is at baseline.  He ambulated in the hallway.  Inpatient Medications    Scheduled Meds: . aspirin EC  81 mg Oral Daily  . atorvastatin  40 mg Oral BH-q7a  . Chlorhexidine Gluconate Cloth  6 each Topical Q0600  . cholecalciferol  1,000 Units Oral BH-q7a  . clopidogrel  75 mg Oral BH-q7a  . escitalopram  5 mg Oral QPM  . furosemide  40 mg Oral BID  . gabapentin  300 mg Oral BH-q7a  . hydrALAZINE  50 mg Oral TID  . insulin aspart  0-5 Units Subcutaneous QHS  . insulin aspart  0-9 Units Subcutaneous TID WC  . montelukast  10 mg Oral QPM  . mupirocin ointment  1 application Nasal BID  . oxybutynin  5 mg Oral QHS  . sodium chloride flush  3 mL Intravenous Q12H  . umeclidinium-vilanterol  1 puff Inhalation BH-q7a  . vitamin B-12  100 mcg Oral BH-q7a  . zolpidem  5 mg Oral QHS   Continuous Infusions: . sodium chloride    . heparin 900 Units/hr (09/10/16 0407)  . nitroGLYCERIN Stopped (09/09/16 0004)   PRN Meds: sodium chloride, acetaminophen, albuterol, ondansetron (ZOFRAN) IV, sodium chloride flush, traMADol   Vital Signs    Vitals:   09/10/16 0400 09/10/16 0500 09/10/16 0600 09/10/16 0700  BP: (!) 141/50 (!) 138/54 (!) 124/49 (!) 149/66  Pulse: (!) 37 (!) 42 (!) 39 (!) 56  Resp: 18 12 13 18   Temp: 98.1 F (36.7 C)   98.4 F (36.9 C)  TempSrc: Oral   Oral  SpO2: 94% 96% 95% 96%  Weight:  214 lb 11.7 oz (97.4 kg)    Height:        Intake/Output Summary (Last 24 hours) at 09/10/16 0819 Last data filed at 09/10/16 0700  Gross per 24 hour  Intake              397 ml  Output              825 ml  Net             -428 ml   Filed Weights   09/08/16 1500 09/09/16 0500 09/10/16 0500  Weight: 204 lb 9.4 oz (92.8 kg) 215 lb (97.5 kg) 214 lb 11.7 oz (97.4 kg)    Telemetry    Predominantly 2:1 HB. - Personally  Reviewed  ECG    NA - Personally Reviewed  Physical Exam   GEN: No  acute distress.   Neck: No  JVD Cardiac:  RRR, no murmurs, rubs, or gallops.  Respiratory: Clear   to auscultation bilaterally. GI: Soft, nontender, non-distended, normal bowel sounds  MS:  No edema; No deformity. Neuro:   Nonfocal  Psych: Oriented and appropriate    Labs    Chemistry  Recent Labs Lab 09/08/16 1138 09/08/16 1302 09/09/16 0435 09/10/16 0218  NA 139  --  138 137  K 4.0  --  3.9 3.5  CL 109  --  105 102  CO2 22  --  25 26  GLUCOSE 95  --  122* 118*  BUN 33*  --  37* 38*  CREATININE 2.48*  --  2.80* 3.03*  CALCIUM 9.0  --  8.8* 8.7*  PROT  --  7.5  --   --   ALBUMIN  --  3.8  --   --   AST  --  21  --   --   ALT  --  17  --   --   ALKPHOS  --  81  --   --   BILITOT  --  0.8  --   --   GFRNONAA 23*  --  20* 18*  GFRAA 26*  --  23* 21*  ANIONGAP 8  --  8 9     Hematology  Recent Labs Lab 09/08/16 1138 09/09/16 0435 09/10/16 0218  WBC 8.1 9.1 9.2  RBC 3.96* 3.69* 3.90*  HGB 11.9* 10.9* 11.6*  HCT 38.6* 35.8* 37.4*  MCV 97.5 97.0 95.9  MCH 30.1 29.5 29.7  MCHC 30.8 30.4 31.0  RDW 14.5 14.3 14.3  PLT 210 170 191    Cardiac Enzymes  Recent Labs Lab 09/08/16 1801 09/08/16 2122 09/09/16 0435  TROPONINI 0.07* 0.06* 0.06*     Recent Labs Lab 09/08/16 1148  TROPIPOC 0.05     BNP  Recent Labs Lab 09/08/16 1302  BNP 724.6*     DDimer No results for input(s): DDIMER in the last 168 hours.   Radiology    Dg Chest 2 View  Result Date: 09/08/2016 CLINICAL DATA:  Shortness of breath. EXAM: CHEST  2 VIEW COMPARISON:  09/07/2016. FINDINGS: Prior CABG. Cardiomegaly with bilateral pulmonary interstitial prominence consistent with CHF. No focal alveolar infiltrate. No pleural effusion or pneumothorax. Degenerative changes thoracic spine . IMPRESSION: Prior CABG. Congestive heart failure with bilateral pulmonary interstitial edema . Electronically Signed   By: Marcello Moores   Register   On: 09/08/2016 11:58   Nm Pulmonary Perf And Vent  Result Date: 09/09/2016 CLINICAL DATA:  83 year old male with a history of elevated D-dimer EXAM: NUCLEAR MEDICINE VENTILATION - PERFUSION LUNG SCAN TECHNIQUE: Ventilation images were obtained in multiple projections using inhaled aerosol Tc-22m DTPA. Perfusion images were obtained in multiple projections after intravenous injection of Tc-32m MAA. RADIOPHARMACEUTICALS:  31.3 mCi Technetium-26m DTPA aerosol inhalation and 4.38 mCi Technetium-47m MAA IV COMPARISON:  None. FINDINGS: Ventilation: Accumulation of radiotracer in the proximal airways, frequently seen with aerosol DTPA. Decreased radiotracer on the ventilation images at the posterior aspect of the left lung evident on left lateral views and left posterior oblique views. There is a matched defect on the perfusion images. Perfusion: Decreased radiotracer on the perfusion images in the posterior left lung evident on left lateral image and left posterior oblique image. No mismatch defect identified. Matched defect appears to represent 2 or 3 segments. Comparison chest x-ray reasonably clear in the left posterior lung. IMPRESSION: Very low probability study for pulmonary embolism. Electronically Signed   By: Corrie Mckusick D.O.   On: 09/09/2016 10:50    Cardiac Studies    ECHO:  09/09/16  Study Conclusions  - Left ventricle: The cavity size was normal. Wall thickness was   increased in a pattern of mild LVH. Systolic function was normal.   The estimated ejection fraction was in the range of 55% to 60%.   Wall motion was normal; there were no regional wall motion   abnormalities. Doppler parameters are consistent with abnormal   left ventricular relaxation (grade 1 diastolic dysfunction). - Mitral valve: Mildly calcified annulus. There was mild   regurgitation. - Left atrium: The atrium was mildly to moderately dilated..   Patient Profile     83 y.o. male with a history of Mobitz  1.  Pt has not wanted PPM due to fear of  infection.  [name] is slowing though.  Now with acute SOB and HR 37.  Yesterday D Dimer 10.86.  Assessment & Plan   ACUTE SYSTOLIC HF:  Down to 2.5 liters since admission.  Creat is up.  I will reduce the Lasix to once daily.    ELEVATED D DIMER:   On heparin and warfarin started this admission by Dr. Caryl Comes for likely DVT.  Very low prob for PE on VQ.   I stopped the warfarin yesterday.  Change to DVT heparin.    CAD:    Medical management.    EF OK as above.   HTN:    BP is reasonably controlled.  Hydralazine increased and amlodipine added.  No change in therapy.   CKD STAGE IV:    Creat is up slightly from baseline.  Continue to follow.  Reduce Lasix as above.   MOBITZ 1 :   EP consulted and plan to reevaluate on Monday for reevaluation on the need for pacing.    ANEMIA:  Hgb is stable.    Signed, Minus Breeding, MD  09/10/2016, 8:19 AM

## 2016-09-11 ENCOUNTER — Encounter (HOSPITAL_COMMUNITY)
Admission: EM | Disposition: A | Discharge status: Home Health | Payer: Self-pay | Source: Home / Self Care | Attending: Cardiology

## 2016-09-11 ENCOUNTER — Encounter (HOSPITAL_COMMUNITY): Payer: Self-pay | Admitting: Internal Medicine

## 2016-09-11 DIAGNOSIS — I459 Conduction disorder, unspecified: Secondary | ICD-10-CM

## 2016-09-11 DIAGNOSIS — I495 Sick sinus syndrome: Secondary | ICD-10-CM

## 2016-09-11 HISTORY — PX: PACEMAKER IMPLANT: EP1218

## 2016-09-11 LAB — BASIC METABOLIC PANEL
Anion gap: 9 (ref 5–15)
BUN: 41 mg/dL — ABNORMAL HIGH (ref 6–20)
CHLORIDE: 102 mmol/L (ref 101–111)
CO2: 25 mmol/L (ref 22–32)
Calcium: 9 mg/dL (ref 8.9–10.3)
Creatinine, Ser: 2.95 mg/dL — ABNORMAL HIGH (ref 0.61–1.24)
GFR calc non Af Amer: 18 mL/min — ABNORMAL LOW (ref >60)
GFR, EST AFRICAN AMERICAN: 21 mL/min — AB (ref >60)
Glucose, Bld: 147 mg/dL — ABNORMAL HIGH (ref 65–99)
POTASSIUM: 3.5 mmol/L (ref 3.5–5.1)
SODIUM: 136 mmol/L (ref 135–145)

## 2016-09-11 LAB — GLUCOSE, CAPILLARY
GLUCOSE-CAPILLARY: 159 mg/dL — AB (ref 65–99)
GLUCOSE-CAPILLARY: 139 mg/dL — AB (ref 65–99)
Glucose-Capillary: 137 mg/dL — ABNORMAL HIGH (ref 65–99)
Glucose-Capillary: 120 mg/dL — ABNORMAL HIGH (ref 65–99)

## 2016-09-11 LAB — SURGICAL PCR SCREEN
MRSA, PCR: NEGATIVE
Staphylococcus aureus: POSITIVE — AB

## 2016-09-11 SURGERY — PACEMAKER IMPLANT
Anesthesia: LOCAL

## 2016-09-11 MED ORDER — ONDANSETRON HCL 4 MG/2ML IJ SOLN: 4.0000 mg | Freq: Four times a day (QID) | INTRAMUSCULAR | Status: DC | PRN

## 2016-09-11 MED ORDER — HEPARIN (PORCINE) IN NACL 2-0.9 UNIT/ML-% IJ SOLN
INTRAMUSCULAR | Status: AC | PRN
  Administered 2016-09-11: 500 mL

## 2016-09-11 MED ORDER — FENTANYL CITRATE (PF) 100 MCG/2ML IJ SOLN
INTRAMUSCULAR | Status: AC
  Filled 2016-09-11: qty 2

## 2016-09-11 MED ORDER — SODIUM CHLORIDE 0.9 % IV SOLN
INTRAVENOUS | Status: DC
  Administered 2016-09-11: 12:00:00 via INTRAVENOUS

## 2016-09-11 MED ORDER — HEPARIN (PORCINE) IN NACL 2-0.9 UNIT/ML-% IJ SOLN
INTRAMUSCULAR | Status: AC
  Filled 2016-09-11: qty 500

## 2016-09-11 MED ORDER — CEFAZOLIN SODIUM-DEXTROSE 2-4 GM/100ML-% IV SOLN
2.0000 g | INTRAVENOUS | Status: AC
  Administered 2016-09-11: 2 g via INTRAVENOUS
  Filled 2016-09-11: qty 100

## 2016-09-11 MED ORDER — CHLORHEXIDINE GLUCONATE 4 % EX LIQD
60.0000 mL | Freq: Once | CUTANEOUS | Status: AC
  Administered 2016-09-11: 4 via TOPICAL
  Filled 2016-09-11: qty 30

## 2016-09-11 MED ORDER — MIDAZOLAM HCL 5 MG/5ML IJ SOLN
INTRAMUSCULAR | Status: AC
  Filled 2016-09-11: qty 5

## 2016-09-11 MED ORDER — FENTANYL CITRATE (PF) 100 MCG/2ML IJ SOLN
INTRAMUSCULAR | Status: DC | PRN
  Administered 2016-09-11: 12.5 ug via INTRAVENOUS

## 2016-09-11 MED ORDER — CEFAZOLIN SODIUM-DEXTROSE 1-4 GM/50ML-% IV SOLN: 1.0000 g | Freq: Four times a day (QID) | INTRAVENOUS | Status: DC

## 2016-09-11 MED ORDER — CEFAZOLIN SODIUM-DEXTROSE 2-4 GM/100ML-% IV SOLN
INTRAVENOUS | Status: AC
  Filled 2016-09-11: qty 100

## 2016-09-11 MED ORDER — LIDOCAINE HCL (PF) 1 % IJ SOLN
INTRAMUSCULAR | Status: AC
  Filled 2016-09-11: qty 60

## 2016-09-11 MED ORDER — SODIUM CHLORIDE 0.9 % IR SOLN
80.0000 mg | Status: AC
  Administered 2016-09-11: 80 mg
  Filled 2016-09-11: qty 2

## 2016-09-11 MED ORDER — MIDAZOLAM HCL 5 MG/5ML IJ SOLN
INTRAMUSCULAR | Status: DC | PRN
  Administered 2016-09-11: 0.5 mg via INTRAVENOUS

## 2016-09-11 MED ORDER — SODIUM CHLORIDE 0.9 % IV SOLN: INTRAVENOUS | Status: AC

## 2016-09-11 MED ORDER — ACETAMINOPHEN 325 MG PO TABS
325.0000 mg | ORAL_TABLET | ORAL | Status: DC | PRN
  Administered 2016-09-11: 650 mg via ORAL
  Filled 2016-09-11: qty 2

## 2016-09-11 MED ORDER — SODIUM CHLORIDE 0.9 % IV SOLN
INTRAVENOUS | Status: DC
  Administered 2016-09-11: 10:00:00 via INTRAVENOUS

## 2016-09-11 MED ORDER — LIDOCAINE HCL (PF) 1 % IJ SOLN
INTRAMUSCULAR | Status: DC | PRN
  Administered 2016-09-11: 45 mL via INTRADERMAL

## 2016-09-11 MED ORDER — CEFAZOLIN SODIUM-DEXTROSE 1-4 GM/50ML-% IV SOLN
1.0000 g | Freq: Two times a day (BID) | INTRAVENOUS | Status: AC
  Administered 2016-09-12: 1 g via INTRAVENOUS
  Filled 2016-09-11: qty 50

## 2016-09-11 MED ORDER — CHLORHEXIDINE GLUCONATE 4 % EX LIQD: 60.0000 mL | Freq: Once | CUTANEOUS | Status: AC

## 2016-09-11 MED ORDER — SODIUM CHLORIDE 0.9 % IR SOLN
Status: AC
  Filled 2016-09-11: qty 2

## 2016-09-11 SURGICAL SUPPLY — 13 items
CABLE SURGICAL S-101-97-12 (CABLE) ×2 IMPLANT
CATH RIGHTSITE C315HIS02 (CATHETERS) ×2 IMPLANT
HOVERMATT SINGLE USE (MISCELLANEOUS) ×2 IMPLANT
IPG PACE AZUR XT DR MRI W1DR01 (Pacemaker) ×1 IMPLANT
LEAD CAPSURE NOVUS 5076-52CM (Lead) ×2 IMPLANT
LEAD SELECT SECURE 3830 383069 (Lead) ×1 IMPLANT
PACE AZURE XT DR MRI W1DR01 (Pacemaker) ×2 IMPLANT
PAD DEFIB LIFELINK (PAD) ×2 IMPLANT
SELECT SECURE 3830 383069 (Lead) ×2 IMPLANT
SHEATH CLASSIC 7F (SHEATH) ×4 IMPLANT
SLITTER 6232ADJ (MISCELLANEOUS) ×2 IMPLANT
TRAY PACEMAKER INSERTION (PACKS) ×2 IMPLANT
WIRE HI TORQ VERSACORE-J 145CM (WIRE) ×2 IMPLANT

## 2016-09-11 NOTE — Progress Notes (Signed)
Progress Note  Patient Name: [name] [name] Date of Encounter: 09/11/2016  Primary Cardiologist: Dr. Meda Coffee Primary Electrophysiologist: Dr. Rayann Heman  Subjective   Feels well this AM, no further rest SOB, hasn't ambulated much, took a slow walk over the weekend no CP, no SOB  Inpatient Medications    Scheduled Meds: . aspirin EC  81 mg Oral Daily  . atorvastatin  40 mg Oral BH-q7a  . cholecalciferol  1,000 Units Oral BH-q7a  . clopidogrel  75 mg Oral BH-q7a  . escitalopram  5 mg Oral QPM  . furosemide  40 mg Oral Daily  . gabapentin  300 mg Oral BH-q7a  . heparin  5,000 Units Subcutaneous Q8H  . hydrALAZINE  50 mg Oral TID  . insulin aspart  0-5 Units Subcutaneous QHS  . insulin aspart  0-9 Units Subcutaneous TID WC  . montelukast  10 mg Oral QPM  . oxybutynin  5 mg Oral QHS  . sodium chloride flush  3 mL Intravenous Q12H  . umeclidinium-vilanterol  1 puff Inhalation BH-q7a  . vitamin B-12  100 mcg Oral BH-q7a  . zolpidem  5 mg Oral QHS   Continuous Infusions: . sodium chloride    . nitroGLYCERIN Stopped (09/09/16 0004)   PRN Meds: sodium chloride, acetaminophen, albuterol, ondansetron (ZOFRAN) IV, sodium chloride flush, traMADol   Vital Signs    Vitals:   09/10/16 1100 09/10/16 1200 09/10/16 2110 09/11/16 0700  BP: (!) 143/55 (!) 167/63 (!) 155/50 (!) 160/57  Pulse: (!) 42 (!) 52 (!) 57 (!) 58  Resp: 14 18 18 18   Temp: 98.6 F (37 C)  98.5 F (36.9 C) 98.7 F (37.1 C)  TempSrc: Oral  Oral Oral  SpO2: 92% 95% 92% 94%  Weight:    213 lb 9.6 oz (96.9 kg)  Height:        Intake/Output Summary (Last 24 hours) at 09/11/16 0726 Last data filed at 09/10/16 0900  Gross per 24 hour  Intake               18 ml  Output                0 ml  Net               18 ml   Filed Weights   09/09/16 0500 09/10/16 0500 09/11/16 0700  Weight: 215 lb (97.5 kg) 214 lb 11.7 oz (97.4 kg) 213 lb 9.6 oz (96.9 kg)    Telemetry    SR Mobitz 1 with 2:1  conduction often, rates  generally 40's - Personally Reviewed  ECG     No new EKGs  Physical Exam   GEN: No acute distress.   Neck: No JVD Cardiac: RRR, no murmurs, rubs, or gallops.  Respiratory: CTA b/l. GI: Soft, nontender, non-distended  MS: No edema; No deformity. Neuro:  Nonfocal  Psych: Normal affect   Labs    Chemistry Recent Labs Lab 09/08/16 1302 09/09/16 0435 09/10/16 0218 09/11/16 0231  NA  --  138 137 136  K  --  3.9 3.5 3.5  CL  --  105 102 102  CO2  --  25 26 25   GLUCOSE  --  122* 118* 147*  BUN  --  37* 38* 41*  CREATININE  --  2.80* 3.03* 2.95*  CALCIUM  --  8.8* 8.7* 9.0  PROT 7.5  --   --   --   ALBUMIN 3.8  --   --   --  AST 21  --   --   --   ALT 17  --   --   --   ALKPHOS 81  --   --   --   BILITOT 0.8  --   --   --   GFRNONAA  --  20* 18* 18*  GFRAA  --  23* 21* 21*  ANIONGAP  --  8 9 9      Hematology Recent Labs Lab 09/08/16 1138 09/09/16 0435 09/10/16 0218  WBC 8.1 9.1 9.2  RBC 3.96* 3.69* 3.90*  HGB 11.9* 10.9* 11.6*  HCT 38.6* 35.8* 37.4*  MCV 97.5 97.0 95.9  MCH 30.1 29.5 29.7  MCHC 30.8 30.4 31.0  RDW 14.5 14.3 14.3  PLT 210 170 191    Cardiac Enzymes Recent Labs Lab 09/08/16 1801 09/08/16 2122 09/09/16 0435  TROPONINI 0.07* 0.06* 0.06*    Recent Labs Lab 09/08/16 1148  TROPIPOC 0.05     BNP Recent Labs Lab 09/08/16 1302  BNP 724.6*     DDimer No results for input(s): DDIMER in the last 168 hours.   Radiology    Nm Pulmonary Perf And Vent Result Date: 09/09/2016 CLINICAL DATA:  83 year old male with a history of elevated D-dimer EXAM: NUCLEAR MEDICINE VENTILATION - PERFUSION LUNG SCAN TECHNIQUE: Ventilation images were obtained in multiple projections using inhaled aerosol Tc-28m DTPA. Perfusion images were obtained in multiple projections after intravenous injection of Tc-37m MAA. RADIOPHARMACEUTICALS:  31.3 mCi Technetium-63m DTPA aerosol inhalation and 4.38 mCi Technetium-82m MAA IV COMPARISON:  None. FINDINGS:  Ventilation: Accumulation of radiotracer in the proximal airways, frequently seen with aerosol DTPA. Decreased radiotracer on the ventilation images at the posterior aspect of the left lung evident on left lateral views and left posterior oblique views. There is a matched defect on the perfusion images. Perfusion: Decreased radiotracer on the perfusion images in the posterior left lung evident on left lateral image and left posterior oblique image. No mismatch defect identified. Matched defect appears to represent 2 or 3 segments. Comparison chest x-ray reasonably clear in the left posterior lung. IMPRESSION: Very low probability study for pulmonary embolism. Electronically Signed   By: Corrie Mckusick D.O.   On: 09/09/2016 10:50   09/09/16: LE venous US Summary: - No evidence of deep vein thrombosis involving the visualized   veins of the right lower extremity. - No evidence of deep vein thrombosis involving the visualized   veins of the left lower extremity. - No evidence of Baker&'s cyst on the right or left.   Cardiac Studies   09/09/16: TTE Study Conclusions - Left ventricle: The cavity size was normal. Wall thickness was   increased in a pattern of mild LVH. Systolic function was normal.   The estimated ejection fraction was in the range of 55% to 60%.   Wall motion was normal; there were no regional wall motion   abnormalities. Doppler parameters are consistent with abnormal   left ventricular relaxation (grade 1 diastolic dysfunction). - Mitral valve: Mildly calcified annulus. There was mild   regurgitation. - Left atrium: The atrium was mildly to moderately dilated.  Patient Profile     83 y.o. male w/past medical history is notable for Mobitz I (previously felt to be asymptomatic), obesity, CAD s/p CABG, diabetes, COPD, and prior CVA.  Admitted with acute respiratory failure CHF, and bradycardia Mobitz 1 with periods of 2:1  Assessment & Plan    1.  Mobitz I with 2:1 AV  block Rates remain generally 40's,  slower at times, particularly overnight  38-42bpm observed while we are talking w/ 2:1 conduction No nodal blocking drugs here or at home  Re-discussed with the patient pacemaker procedure, risks/benefits, he is agreeable to proceed Will hold Plavix for now, no coronary interventions since hs CABG many years ago, last peripheral vascular intervention also years ago  2.  Acute on chronic diastolic heart failure Fluid negative -2,549ml O2 sats 92-96% on RA Feeling much better, is without ongoing SOB  3.  CKD, stage IV Acutely worsened by diuresis, today's slightly better on decreased lasix dose He will get some IVF today pre-procedure  4.  Elevated D-dimer Negative VQ and LE venous US  5.  HTN-urgency better   6. CAD No c/o CP LVEF 55-60% no WMA Neg stress myoview Nov 2016   Signed, Baldwin Jamaica, PA-C  09/11/2016, 7:26 AM     Symptomatic bradycardia and MBZ1 and 2:1 conduction system disease  The benefits and risks were reviewed including but not limited to death,  perforation, infection, lead dislodgement and device malfunction.  The patient understands agrees and is willing to proceed.  Will attempt HIS lead placement orDual chamber pacemaker  ]

## 2016-09-11 NOTE — Progress Notes (Signed)
Progress Note  Patient Name: [name] [name] Date of Encounter: 09/11/2016  Primary Cardiologist: Nelson/Allred  Subjective   Discussed with EP this morning, agreeable to pacemaker. No chest pain. Not much activity.  Inpatient Medications    Scheduled Meds: . aspirin EC  81 mg Oral Daily  . atorvastatin  40 mg Oral BH-q7a  . cholecalciferol  1,000 Units Oral BH-q7a  . clopidogrel  75 mg Oral BH-q7a  . escitalopram  5 mg Oral QPM  . furosemide  40 mg Oral Daily  . gabapentin  300 mg Oral BH-q7a  . hydrALAZINE  50 mg Oral TID  . insulin aspart  0-5 Units Subcutaneous QHS  . insulin aspart  0-9 Units Subcutaneous TID WC  . montelukast  10 mg Oral QPM  . oxybutynin  5 mg Oral QHS  . sodium chloride flush  3 mL Intravenous Q12H  . umeclidinium-vilanterol  1 puff Inhalation BH-q7a  . vitamin B-12  100 mcg Oral BH-q7a  . zolpidem  5 mg Oral QHS   Continuous Infusions: . sodium chloride    . nitroGLYCERIN Stopped (09/09/16 0004)   PRN Meds: sodium chloride, acetaminophen, albuterol, ondansetron (ZOFRAN) IV, sodium chloride flush, traMADol   Vital Signs    Vitals:   09/10/16 1100 09/10/16 1200 09/10/16 2110 09/11/16 0700  BP: (!) 143/55 (!) 167/63 (!) 155/50 (!) 160/57  Pulse: (!) 42 (!) 52 (!) 57 (!) 58  Resp: 14 18 18 18   Temp: 98.6 F (37 C)  98.5 F (36.9 C) 98.7 F (37.1 C)  TempSrc: Oral  Oral Oral  SpO2: 92% 95% 92% 94%  Weight:    213 lb 9.6 oz (96.9 kg)  Height:        Intake/Output Summary (Last 24 hours) at 09/11/16 0841 Last data filed at 09/10/16 0900  Gross per 24 hour  Intake                9 ml  Output                0 ml  Net                9 ml   Filed Weights   09/09/16 0500 09/10/16 0500 09/11/16 0700  Weight: 215 lb (97.5 kg) 214 lb 11.7 oz (97.4 kg) 213 lb 9.6 oz (96.9 kg)    Telemetry    2-1 AV block - Personally Reviewed  ECG    On 09/08/16-heart rate 37 bpm with 2:1 AV block. - Personally Reviewed  Physical Exam   GEN:  Alert, in no distress.   Neck: No JVD Cardiac: Bradycardic regular, no murmurs, rubs, or gallops.  Respiratory: Clear to auscultation bilaterally. GI:  soft, nondistended.  MS: No edema; No deformity. Neuro:  Nonfocal  Psych: Normal affect   Labs    Chemistry Recent Labs Lab 09/08/16 1302 09/09/16 0435 09/10/16 0218 09/11/16 0231  NA  --  138 137 136  K  --  3.9 3.5 3.5  CL  --  105 102 102  CO2  --  25 26 25   GLUCOSE  --  122* 118* 147*  BUN  --  37* 38* 41*  CREATININE  --  2.80* 3.03* 2.95*  CALCIUM  --  8.8* 8.7* 9.0  PROT 7.5  --   --   --   ALBUMIN 3.8  --   --   --   AST 21  --   --   --  ALT 17  --   --   --   ALKPHOS 81  --   --   --   BILITOT 0.8  --   --   --   GFRNONAA  --  20* 18* 18*  GFRAA  --  23* 21* 21*  ANIONGAP  --  8 9 9      Hematology Recent Labs Lab 09/08/16 1138 09/09/16 0435 09/10/16 0218  WBC 8.1 9.1 9.2  RBC 3.96* 3.69* 3.90*  HGB 11.9* 10.9* 11.6*  HCT 38.6* 35.8* 37.4*  MCV 97.5 97.0 95.9  MCH 30.1 29.5 29.7  MCHC 30.8 30.4 31.0  RDW 14.5 14.3 14.3  PLT 210 170 191    Cardiac Enzymes Recent Labs Lab 09/08/16 1801 09/08/16 2122 09/09/16 0435  TROPONINI 0.07* 0.06* 0.06*    Recent Labs Lab 09/08/16 1148  TROPIPOC 0.05     BNP Recent Labs Lab 09/08/16 1302  BNP 724.6*     DDimer No results for input(s): DDIMER in the last 168 hours.   Radiology    Nm Pulmonary Perf And Vent  Result Date: 09/09/2016 CLINICAL DATA:  83 year old male with a history of elevated D-dimer EXAM: NUCLEAR MEDICINE VENTILATION - PERFUSION LUNG SCAN TECHNIQUE: Ventilation images were obtained in multiple projections using inhaled aerosol Tc-76m DTPA. Perfusion images were obtained in multiple projections after intravenous injection of Tc-63m MAA. RADIOPHARMACEUTICALS:  31.3 mCi Technetium-57m DTPA aerosol inhalation and 4.38 mCi Technetium-44m MAA IV COMPARISON:  None. FINDINGS: Ventilation: Accumulation of radiotracer in the proximal  airways, frequently seen with aerosol DTPA. Decreased radiotracer on the ventilation images at the posterior aspect of the left lung evident on left lateral views and left posterior oblique views. There is a matched defect on the perfusion images. Perfusion: Decreased radiotracer on the perfusion images in the posterior left lung evident on left lateral image and left posterior oblique image. No mismatch defect identified. Matched defect appears to represent 2 or 3 segments. Comparison chest x-ray reasonably clear in the left posterior lung. IMPRESSION: Very low probability study for pulmonary embolism. Electronically Signed   By: Corrie Mckusick D.O.   On: 09/09/2016 10:50    Cardiac Studies   Echocardiogram 09/09/16:  - Left ventricle: The cavity size was normal. Wall thickness was   increased in a pattern of mild LVH. Systolic function was normal.   The estimated ejection fraction was in the range of 55% to 60%.   Wall motion was normal; there were no regional wall motion   abnormalities. Doppler parameters are consistent with abnormal   left ventricular relaxation (grade 1 diastolic dysfunction). - Mitral valve: Mildly calcified annulus. There was mild   regurgitation. - Left atrium: The atrium was mildly to moderately dilated.  Patient Profile     83 y.o. male with 2:1 A-V heart block awaiting pacemaker. Previously did not pacemaker due to fear of infection.  Assessment & Plan    2:1 AV block  - Pacemaker. EP note reviewed. He is amenable to proceed.  Acute diastolic heart failure  - 2.5 L output thus far.  - Creatinine in the 2.4-3.0 range  Demand ischemia  - No chest pain. Troponin minimally elevated 0.06.  - Not true ACS. This does however portend a worse prognosis.  Elevated d-dimer  - VQ scan was low probability. Warfarin was stopped on 09/09/16.  - No evidence of DVT on lower extremity Doppler on 09/09/16. No current DVT prophylaxis with heparin because of upcoming pacemaker  procedure.  -  CAD  - Remote history of CABG.  - No anginal symptoms.  - Stopped Plavix  Chronic kidney disease stage IV  - Has been changed to by mouth Lasix.  Signed, Candee Furbish, MD  09/11/2016, 8:41 AM

## 2016-09-12 ENCOUNTER — Inpatient Hospital Stay (HOSPITAL_COMMUNITY): Payer: Medicare Other

## 2016-09-12 DIAGNOSIS — I441 Atrioventricular block, second degree: Secondary | ICD-10-CM

## 2016-09-12 LAB — BASIC METABOLIC PANEL
Anion gap: 10 (ref 5–15)
BUN: 44 mg/dL — AB (ref 6–20)
CALCIUM: 8.9 mg/dL (ref 8.9–10.3)
CO2: 25 mmol/L (ref 22–32)
Chloride: 102 mmol/L (ref 101–111)
Creatinine, Ser: 2.95 mg/dL — ABNORMAL HIGH (ref 0.61–1.24)
GFR calc Af Amer: 21 mL/min — ABNORMAL LOW (ref >60)
GFR, EST NON AFRICAN AMERICAN: 18 mL/min — AB (ref >60)
GLUCOSE: 131 mg/dL — AB (ref 65–99)
POTASSIUM: 3.6 mmol/L (ref 3.5–5.1)
Sodium: 137 mmol/L (ref 135–145)

## 2016-09-12 LAB — GLUCOSE, CAPILLARY
Glucose-Capillary: 139 mg/dL — ABNORMAL HIGH (ref 65–99)
Glucose-Capillary: 130 mg/dL — ABNORMAL HIGH (ref 65–99)

## 2016-09-12 MED ORDER — HYDRALAZINE HCL 50 MG PO TABS: 50.0000 mg | ORAL_TABLET | Freq: Three times a day (TID) | ORAL | 6 refills | Status: DC

## 2016-09-12 NOTE — Care Management Important Message (Signed)
Important Message  Patient Details  Name: [name] [name] MRN: [id number] Date of Birth: [birth year]   Medicare Important Message Given:  Yes    [name] 09/12/2016, 2:11 PM

## 2016-09-12 NOTE — Consult Note (Signed)
           [facility] CM Primary Care Navigator  09/12/2016  JASYN MEY Aug 12, 1931 [id number]   Went to see patientat the bedsideto identify possible discharge needs but he was alreadydischarged.  Patient was discharged home todayper staff report with home health services ([facility]).  Primary care provider's officecalled (Caren)to notify of patient's discharge and need for post hospital follow-up and transition of care. Reminded also of patient's health issues needing follow-up.  Made aware to refer patient to [facility] care management ifdeemed appropriatefor services.    For questions, please contact:  Dannielle Huh, BSN, RN- [facility] Primary Care Navigator  Telephone: [phone] [place]

## 2016-09-12 NOTE — Discharge Summary (Signed)
ELECTROPHYSIOLOGY PROCEDURE DISCHARGE SUMMARY    Patient ID: [name] [name],  MRN: [id number], DOB/AGE: [birth year] 83 y.o.  Admit date: 09/08/2016 Discharge date: 09/12/2016  Primary Care Physician: Jani Gravel, MD  Primary Cardiologist: Dr. Meda Coffee Electrophysiologist: Dr. Rayann Heman  Primary Discharge Diagnosis:  1.  Acute CHF (diastolic) 2.  Symptomatic Bradycardia, Mobitz 1 heart block, 2:1 heart block  Secondary Discharge Diagnosis:  1. CAD/PVD 2. DM 3. HTN 4. CRI (IV)  No Known Allergies   Procedures This Admission:  1.  Implantation of a MDT dual chamber PPM on 09/12/16 by Dr Caryl Comes.  The patient received a Medtronic MRI compatible pulse generator serial number [id number] H.Medtronic MRI compatible 3830 ventricular lead serial number [id number] (HIS BUNDLE where mapping identifed a His Potential. However following deployment of the lead, septal pacing was evident, not Para-Hisian.The lead was left there) and a Medtronic MRI compatible 5076 atrial lead serial number [id number]  There were no immediate post procedure complications. 2.  CXR on 09/12/16 demonstrated no pneumothorax status post device implantation.   Brief HPI: [name] [name] is a 83 y.o. male was admitted 09/08/16 with progressively worsening SOB diagnosed with acute CHF initially requiring BIPAP support, was as well as bradycardic, acute/chronc renal insufficiency and abnormal DDimer.    Hospital Course:  The patient was admitted, diuresis started, BIPAP support provided, EP was consulted given bradycardia, Mobitz 1 heart block with periods of 2:1 conduction, his BP markedly elevated and medicines adjusted for this.  He was started on heparin gtt though ultimately LE venous US was negative for DVT and VQ scan was very low probability for PE and stopped for DVT prophylaxis alone.  Echo noted LVH, LVEF 55-60% w/o WMA and no significant VHD.  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  He had out patient been hesitant to get PPM, with HR 40-50's, though of late noted 30-40's which when in 2:2 block was as well observed here.  He was diuresed over the weekend and doing much better on RA sats 92-96% changed to PO daily lasix and in discussion with Dr. Caryl Comes, was agreeable to proceed with PPM, he underwent PPM yesterday by Dr. Caryl Comes with details as outlined above.   He has not had any coronary or vascular intervention in many years and post implant Dr. Caryl Comes recommended discontinuation of the Plavix to remain on ASA alone until seen by his primary cardiology team in f/u.  He was monitored on telemetry overnight which demonstrated SR/Vpaced rhythm.  He diuresed during his stay cumulatively -3,752ml and is back to his baseline without SOB or edema, his weight down 10 pounds.  He has ambulated in the hall this morning with difficulty or SOB. His creat down slightly from IV diuresis to 2.95, (noting in June Creat 2.69).  His BP is improving and will be followed closely out patient. 128/76 while I am seeing him, and this morning particularly has been improved 140's/70's.  Left chest was without hematoma or ecchymosis.  The device was interrogated and found to be functioning normally.  The patient reports he feels ready to go.  I discussed Ancef adjustment made with pharmacy, given creatinine clearance patient has received complete surgical prophylaxis antibiotic.  CXR was obtained and demonstrated no pneumothorax status post device implantation.  Wound care, arm mobility, and restrictions were reviewed with the patient.  Discussed at length low sodium diet, daily weights and monitoring for  LE swelling/fluid retention.  A TOC appointment with cardiology was arranged as well as f/u with Dr. Elissa Hefty PA for tomorrow.  The patient was examined by Dr. Rayann Heman and considered stable for discharge to home.    Physical Exam: Vitals:   09/11/16 1700  09/11/16 1715 09/12/16 0615 09/12/16 0727  BP: (!) 194/91 (!) 185/96 (!) 155/78   Pulse:   86   Resp:   16   Temp:   98.6 F (37 C)   TempSrc:   Oral   SpO2:   92% 96%  Weight:   213 lb 9.6 oz (96.9 kg)   Height:        GEN- The patient somewhat chronically ill appearing, alert and oriented x 3 today.   HEENT: normocephalic, atraumatic; sclera clear, conjunctiva pink; hearing intact; oropharynx clear; neck supple, no JVP Lungs- CTA b/l, normal work of breathing.  No wheezes, rales, rhonchi Heart- RRR, no murmurs, rubs or gallops, PMI not laterally displaced GI- soft, non-tender, non-distended, bowel sounds present, no hepatosplenomegaly Extremities- no clubbing, cyanosis, no edema remains MS- no significant deformity or atrophy Skin- warm and dry, no rash or lesion, left chest without hematoma/ecchymosis Psych- euthymic mood, full affect Neuro- no gross deficits   Labs:   Lab Results  Component Value Date   WBC 9.2 09/10/2016   HGB 11.6 (L) 09/10/2016   HCT 37.4 (L) 09/10/2016   MCV 95.9 09/10/2016   PLT 191 09/10/2016    Recent Labs Lab 09/08/16 1302  09/12/16 0300  NA  --   < > 137  K  --   < > 3.6  CL  --   < > 102  CO2  --   < > 25  BUN  --   < > 44*  CREATININE  --   < > 2.95*  CALCIUM  --   < > 8.9  PROT 7.5  --   --   BILITOT 0.8  --   --   ALKPHOS 81  --   --   ALT 17  --   --   AST 21  --   --   GLUCOSE  --   < > 131*  < > = values in this interval not displayed.  Discharge Medications:  Allergies as of 09/12/2016   No Known Allergies     Medication List    STOP taking these medications   clopidogrel 75 MG tablet Commonly known as:  PLAVIX     TAKE these medications   ACTOS 15 MG tablet Generic drug:  pioglitazone Take 15 mg by mouth every evening.   ANORO ELLIPTA 62.5-25 MCG/INH Aepb Generic drug:  umeclidinium-vilanterol Inhale 1 puff into the lungs every morning.   aspirin 81 MG tablet Take 81 mg by mouth every morning.     atorvastatin 40 MG tablet Commonly known as:  LIPITOR Take 40 mg by mouth every morning.   cholecalciferol 1000 units tablet Commonly known as:  VITAMIN D Take 1,000 Units by mouth every morning.   escitalopram 5 MG tablet Commonly known as:  LEXAPRO Take 5 mg by mouth every evening.   furosemide 20 MG tablet Commonly known as:  LASIX Take 0.5 tablets (10 mg total) by mouth daily as needed for fluid or edema.   gabapentin 300 MG capsule Commonly known as:  NEURONTIN Take 300 mg by mouth every morning.   hydrALAZINE 50 MG tablet Commonly known as:  APRESOLINE Take 1 tablet (50 mg total) by mouth  3 (three) times daily. What changed:  medication strength  how much to take   montelukast 10 MG tablet Commonly known as:  SINGULAIR Take 10 mg by mouth every evening.   oxybutynin 5 MG 24 hr tablet Commonly known as:  DITROPAN-XL Take 5 mg by mouth at bedtime.   PROAIR HFA 108 (90 Base) MCG/ACT inhaler Generic drug:  albuterol Inhale 2 puffs into the lungs every 4 (four) hours as needed for wheezing or shortness of breath.   TRADJENTA 5 MG Tabs tablet Generic drug:  linagliptin Take 5 mg by mouth every morning.   traMADol 50 MG tablet Commonly known as:  ULTRAM Take 1 tablet by mouth every 6 (six) hours as needed for moderate pain.   vitamin B-12 100 MCG tablet Commonly known as:  CYANOCOBALAMIN Take 100 mcg by mouth every morning.   zolpidem 10 MG tablet Commonly known as:  AMBIEN Take 5 mg by mouth at bedtime.       Disposition:  Home Discharge Instructions    Diet - low sodium heart healthy    Complete by:  As directed    Increase activity slowly    Complete by:  As directed      Follow-up Information    [facility] Follow up on 09/21/2016.   Specialty:  Cardiology Why:  10:30AM, wound check Contact information: [address], [place]       Thompson Grayer, MD Follow up on  12/15/2016.   Specialty:  Cardiology Why:  11:00AM Contact information: [place] [address] [phone]        Donato Heinz, MD Follow up on 09/13/2016.   Specialty:  Nephrology Why:  Your appointment is with Shirlee Limerick (PA), please arrive at 1:45PM for a 2:00PM appointment Contact information: [place] [address] [phone]        Consuelo Pandy, PA-C Follow up on 09/21/2016.   Specialties:  Cardiology, Radiology Why:  9:30AM Contact information: [place] [address] [phone]           Duration of Discharge Encounter: Greater than 30 minutes including physician time.  Signed, Tommye Standard, PA-C 09/12/2016 12:15 PM  I have seen, examined the patient, and reviewed the above assessment and plan.  Changes to above are made where necessary.  Device interrogation reviewed and normal.  CXR reveals stable leads.  Co Sign: Thompson Grayer, MD 09/12/2016 4:54 PM

## 2016-09-12 NOTE — Discharge Instructions (Signed)
Supplemental Discharge Instructions for  Pacemaker/Defibrillator Patients  Activity No heavy lifting or vigorous activity with your left/right arm for 6 to 8 weeks.  Do not raise your left/right arm above your head for one week.  Gradually raise your affected arm as drawn below.              09/15/16                    09/16/16                     09/17/16                  09/18/16 __  NO DRIVING for  1 week  ; you may begin driving on  11/17/76  .  WOUND CARE - Keep the wound area clean and dry.  Do not get this area wet, no showers for 24 hours; you may shower on  09/18/16 . - The tape/steri-strips on your wound will fall off; do not pull them off.  No bandage is needed on the site.  DO  NOT apply any creams, oils, or ointments to the wound area. - If you notice any drainage or discharge from the wound, any swelling or bruising at the site, or you develop a fever > 101? F after you are discharged home, call the office at once.  Special Instructions - You are still able to use cellular telephones; use the ear opposite the side where you have your pacemaker/defibrillator.  Avoid carrying your cellular phone near your device. - When traveling through airports, show security personnel your identification card to avoid being screened in the metal detectors.  Ask the security personnel to use the hand wand. - Avoid arc welding equipment, MRI testing (magnetic resonance imaging), TENS units (transcutaneous nerve stimulators).  Call the office for questions about other devices. - Avoid electrical appliances that are in poor condition or are not properly grounded. - Microwave ovens are safe to be near or to operate.  Additional information for defibrillator patients should your device go off: - If your device goes off ONCE and you feel fine afterward, notify the device clinic nurses. - If your device goes off ONCE and you do not feel well afterward, call 911. - If your device goes off TWICE, call  911. - If your device goes off THREE times in one day, call 911.  DO NOT DRIVE YOURSELF OR A FAMILY MEMBER WITH A DEFIBRILLATOR TO THE HOSPITAL--CALL 911.     Heart Failure Heart failure is a condition in which the heart has trouble pumping blood because it has become weak or stiff. This means that the heart does not pump blood efficiently for the body to work well. For some people with heart failure, fluid may back up into the lungs and there may be swelling (edema) in the lower legs. Heart failure is usually a long-term (chronic) condition. It is important for you to take good care of yourself and follow the treatment plan from your health care provider. What are the causes? This condition is caused by some health problems, including:  High blood pressure (hypertension). Hypertension causes the heart muscle to work harder than normal. High blood pressure eventually causes the heart to become stiff and weak.  Coronary artery disease (CAD). CAD is the buildup of cholesterol and fat (plaques) in the arteries of the heart.  Heart attack (myocardial infarction). Injured tissue, which is caused by  the heart attack, does not contract as well and the heart's ability to pump blood is weakened.  Abnormal heart valves. When the heart valves do not open and close properly, the heart muscle must pump harder to keep the blood flowing.  Heart muscle disease (cardiomyopathy or myocarditis). Heart muscle disease is damage to the heart muscle from a variety of causes, such as drug or alcohol abuse, infections, or unknown causes. These can increase the risk of heart failure.  Lung disease. When the lungs do not work properly, the heart must work harder.  What increases the risk? Risk of heart failure increases as a person ages. This condition is also more likely to develop in people who:  Are overweight.  Are male.  Smoke or chew tobacco.  Abuse alcohol or illegal drugs.  Have taken medicines  that can damage the heart, such as chemotherapy drugs.  Have diabetes. ? High blood sugar (glucose) is associated with high fat (lipid) levels in the blood. ? Diabetes can also damage tiny blood vessels that carry nutrients to the heart muscle.  Have abnormal heart rhythms.  Have thyroid problems.  Have low blood counts (anemia).  What are the signs or symptoms? Symptoms of this condition include:  Shortness of breath with activity, such as when climbing stairs.  Persistent cough.  Swelling of the feet, ankles, legs, or abdomen.  Unexplained weight gain.  Difficulty breathing when lying flat (orthopnea).  Waking from sleep because of the need to sit up and get more air.  Rapid heartbeat.  Fatigue and loss of energy.  Feeling light-headed, dizzy, or close to fainting.  Loss of appetite.  Nausea.  Increased urination during the night (nocturia).  Confusion.  How is this diagnosed? This condition is diagnosed based on:  Medical history, symptoms, and a physical exam.  Diagnostic tests, which may include: ? Echocardiogram. ? Electrocardiogram (ECG). ? Chest X-ray. ? Blood tests. ? Exercise stress test. ? Radionuclide scans. ? Cardiac catheterization and angiogram.  How is this treated? Treatment for this condition is aimed at managing the symptoms of heart failure. Medicines, behavioral changes, or other treatments may be necessary to treat heart failure. Medicines These may include:  Angiotensin-converting enzyme (ACE) inhibitors. This type of medicine blocks the effects of a blood protein called angiotensin-converting enzyme. ACE inhibitors relax (dilate) the blood vessels and help to lower blood pressure.  Angiotensin receptor blockers (ARBs). This type of medicine blocks the actions of a blood protein called angiotensin. ARBs dilate the blood vessels and help to lower blood pressure.  Water pills (diuretics). Diuretics cause the kidneys to remove salt  and water from the blood. The extra fluid is removed through urination, leaving a lower volume of blood that the heart has to pump.  Beta blockers. These improve heart muscle strength and they prevent the heart from beating too quickly.  Digoxin. This increases the force of the heartbeat.  Healthy behavior changes These may include:  Reaching and maintaining a healthy weight.  Stopping smoking or chewing tobacco.  Eating heart-healthy foods.  Limiting or avoiding alcohol.  Stopping use of street drugs (illegal drugs).  Physical activity.  Other treatments These may include:  Surgery to open blocked coronary arteries or repair damaged heart valves.  Placement of a biventricular pacemaker to improve heart muscle function (cardiac resynchronization therapy). This device paces both the right ventricle and left ventricle.  Placement of a device to treat serious abnormal heart rhythms (implantable cardioverter defibrillator, or ICD).  Placement of  a device to improve the pumping ability of the heart (left ventricular assist device, or LVAD).  Heart transplant. This can cure heart failure, and it is considered for certain patients who do not improve with other therapies.  Follow these instructions at home: Medicines  Take over-the-counter and prescription medicines only as told by your health care provider. Medicines are important in reducing the workload of your heart, slowing the progression of heart failure, and improving your symptoms. ? Do not stop taking your medicine unless your health care provider told you to do that. ? Do not skip any dose of medicine. ? Refill your prescriptions before you run out of medicine. You need your medicines every day. Eating and drinking   Eat heart-healthy foods. Talk with a dietitian to make an eating plan that is right for you. ? Choose foods that contain no trans fat and are low in saturated fat and cholesterol. Healthy choices include  fresh or frozen fruits and vegetables, fish, lean meats, legumes, fat-free or low-fat dairy products, and whole-grain or high-fiber foods. ? Limit salt (sodium) if directed by your health care provider. Sodium restriction may reduce symptoms of heart failure. Ask a dietitian to recommend heart-healthy seasonings. ? Use healthy cooking methods instead of frying. Healthy methods include roasting, grilling, broiling, baking, poaching, steaming, and stir-frying.  Limit your fluid intake if directed by your health care provider. Fluid restriction may reduce symptoms of heart failure. Lifestyle  Stop smoking or using chewing tobacco. Nicotine and tobacco can damage your heart and your blood vessels. Do not use nicotine gum or patches before talking to your health care provider.  Limit alcohol intake to no more than 1 drink per day for non-pregnant women and 2 drinks per day for men. One drink equals 12 oz of beer, 5 oz of wine, or 1 oz of hard liquor. ? Drinking more than that is harmful to your heart. Tell your health care provider if you drink alcohol several times a week. ? Talk with your health care provider about whether any level of alcohol use is safe for you. ? If your heart has already been damaged by alcohol or you have severe heart failure, drinking alcohol should be stopped completely.  Stop use of illegal drugs.  Lose weight if directed by your health care provider. Weight loss may reduce symptoms of heart failure.  Do moderate physical activity if directed by your health care provider. People who are elderly and people with severe heart failure should consult with a health care provider for physical activity recommendations. Monitor important information  Weigh yourself every day. Keeping track of your weight daily helps you to notice excess fluid sooner. ? Weigh yourself every morning after you urinate and before you eat breakfast. ? Wear the same amount of clothing each time you  weigh yourself. ? Record your daily weight. Provide your health care provider with your weight record.  Monitor and record your blood pressure as told by your health care provider.  Check your pulse as told by your health care provider. Dealing with extreme temperatures  If the weather is extremely hot: ? Avoid vigorous physical activity. ? Use air conditioning or fans or seek a cooler location. ? Avoid caffeine and alcohol. ? Wear loose-fitting, lightweight, and light-colored clothing.  If the weather is extremely cold: ? Avoid vigorous physical activity. ? Layer your clothes. ? Wear mittens or gloves, a hat, and a scarf when you go outside. ? Avoid alcohol. General instructions  Manage other health conditions such as hypertension, diabetes, thyroid disease, or abnormal heart rhythms as told by your health care provider.  Learn to manage stress. If you need help to do this, ask your health care provider.  Plan rest periods when fatigued.  Get ongoing education and support as needed.  Participate in or seek rehabilitation as needed to maintain or improve independence and quality of life.  Stay up to date with immunizations. Keeping current on pneumococcal and influenza immunizations is especially important to prevent respiratory infections.  Keep all follow-up visits as told by your health care provider. This is important. Contact a health care provider if:  You have a rapid weight gain.  You have increasing shortness of breath that is unusual for you.  You are unable to participate in your usual physical activities.  You tire easily.  You cough more than normal, especially with physical activity.  You have any swelling or more swelling in areas such as your hands, feet, ankles, or abdomen.  You are unable to sleep because it is hard to breathe.  You feel like your heart is beating quickly (palpitations).  You become dizzy or light-headed when you stand up. Get  help right away if:  You have difficulty breathing.  You notice or your family notices a change in your awareness, such as having trouble staying awake or having difficulty with concentration.  You have pain or discomfort in your chest.  You have an episode of fainting (syncope). This information is not intended to replace advice given to you by your health care provider. Make sure you discuss any questions you have with your health care provider. Document Released: 02/20/2005 Document Revised: 10/26/2015 Document Reviewed: 09/15/2015 Elsevier Interactive Patient Education  2017 [employer].

## 2016-09-12 NOTE — Care Management Note (Signed)
Case Management Note  Patient Details  Name: [name] [name] MRN: [id number] Date of Birth: 02-10-1932  Subjective/Objective: Pt presented for Acute CHF and symptomatic bradycardia -post pacemaker insertion. Pt is from home with family support and is active wit [facility].                    Action/Plan: CM did make agency aware that pt will d/c today- resumption order to be placed in EPIC. No further needs from CM at this time.   Expected Discharge Date:  09/12/16               Expected Discharge Plan:  [place]  In-House Referral:  NA  Discharge planning Services  CM Consult  Post Acute Care Choice:  Home Health, Resumption of Svcs/PTA Provider Choice offered to:  Patient  DME Arranged:  N/A DME Agency:  NA  HH Arranged:  RN [place] Agency:  [facility] [employer]  Status of Service:  Completed, signed off  If discussed at [employer], dates discussed:    Additional Comments:  Bethena Roys, RN 09/12/2016, 12:48 PM

## 2016-09-13 DIAGNOSIS — E785 Hyperlipidemia, unspecified: Secondary | ICD-10-CM | POA: Diagnosis not present

## 2016-09-13 DIAGNOSIS — N2581 Secondary hyperparathyroidism of renal origin: Secondary | ICD-10-CM | POA: Diagnosis not present

## 2016-09-13 DIAGNOSIS — D509 Iron deficiency anemia, unspecified: Secondary | ICD-10-CM | POA: Diagnosis not present

## 2016-09-13 DIAGNOSIS — I639 Cerebral infarction, unspecified: Secondary | ICD-10-CM | POA: Diagnosis not present

## 2016-09-13 DIAGNOSIS — I2510 Atherosclerotic heart disease of native coronary artery without angina pectoris: Secondary | ICD-10-CM | POA: Diagnosis not present

## 2016-09-13 DIAGNOSIS — M069 Rheumatoid arthritis, unspecified: Secondary | ICD-10-CM | POA: Diagnosis not present

## 2016-09-13 DIAGNOSIS — I714 Abdominal aortic aneurysm, without rupture: Secondary | ICD-10-CM | POA: Diagnosis not present

## 2016-09-13 DIAGNOSIS — D472 Monoclonal gammopathy: Secondary | ICD-10-CM | POA: Diagnosis not present

## 2016-09-13 DIAGNOSIS — N184 Chronic kidney disease, stage 4 (severe): Secondary | ICD-10-CM | POA: Diagnosis not present

## 2016-09-13 DIAGNOSIS — I441 Atrioventricular block, second degree: Secondary | ICD-10-CM | POA: Diagnosis not present

## 2016-09-13 DIAGNOSIS — I10 Essential (primary) hypertension: Secondary | ICD-10-CM | POA: Diagnosis not present

## 2016-09-13 DIAGNOSIS — E119 Type 2 diabetes mellitus without complications: Secondary | ICD-10-CM | POA: Diagnosis not present

## 2016-09-16 DIAGNOSIS — Z48812 Encounter for surgical aftercare following surgery on the circulatory system: Secondary | ICD-10-CM | POA: Diagnosis not present

## 2016-09-16 DIAGNOSIS — E1151 Type 2 diabetes mellitus with diabetic peripheral angiopathy without gangrene: Secondary | ICD-10-CM | POA: Diagnosis not present

## 2016-09-16 DIAGNOSIS — I130 Hypertensive heart and chronic kidney disease with heart failure and stage 1 through stage 4 chronic kidney disease, or unspecified chronic kidney disease: Secondary | ICD-10-CM | POA: Diagnosis not present

## 2016-09-16 DIAGNOSIS — I5033 Acute on chronic diastolic (congestive) heart failure: Secondary | ICD-10-CM | POA: Diagnosis not present

## 2016-09-16 DIAGNOSIS — E1122 Type 2 diabetes mellitus with diabetic chronic kidney disease: Secondary | ICD-10-CM | POA: Diagnosis not present

## 2016-09-16 DIAGNOSIS — I441 Atrioventricular block, second degree: Secondary | ICD-10-CM | POA: Diagnosis not present

## 2016-09-19 DIAGNOSIS — I441 Atrioventricular block, second degree: Secondary | ICD-10-CM | POA: Diagnosis not present

## 2016-09-19 DIAGNOSIS — I5033 Acute on chronic diastolic (congestive) heart failure: Secondary | ICD-10-CM | POA: Diagnosis not present

## 2016-09-19 DIAGNOSIS — I130 Hypertensive heart and chronic kidney disease with heart failure and stage 1 through stage 4 chronic kidney disease, or unspecified chronic kidney disease: Secondary | ICD-10-CM | POA: Diagnosis not present

## 2016-09-19 DIAGNOSIS — E1151 Type 2 diabetes mellitus with diabetic peripheral angiopathy without gangrene: Secondary | ICD-10-CM | POA: Diagnosis not present

## 2016-09-19 DIAGNOSIS — Z48812 Encounter for surgical aftercare following surgery on the circulatory system: Secondary | ICD-10-CM | POA: Diagnosis not present

## 2016-09-19 DIAGNOSIS — E1122 Type 2 diabetes mellitus with diabetic chronic kidney disease: Secondary | ICD-10-CM | POA: Diagnosis not present

## 2016-09-21 ENCOUNTER — Encounter: Payer: Self-pay | Admitting: Cardiology

## 2016-09-21 ENCOUNTER — Encounter: Payer: Self-pay | Admitting: Internal Medicine

## 2016-09-21 ENCOUNTER — Ambulatory Visit (INDEPENDENT_AMBULATORY_CARE_PROVIDER_SITE_OTHER): Payer: Medicare Other | Admitting: *Deleted

## 2016-09-21 ENCOUNTER — Ambulatory Visit (INDEPENDENT_AMBULATORY_CARE_PROVIDER_SITE_OTHER): Payer: Medicare Other | Admitting: Cardiology

## 2016-09-21 VITALS — BP 122/62 | HR 74 | Ht 70.0 in | Wt 214.0 lb

## 2016-09-21 DIAGNOSIS — I5032 Chronic diastolic (congestive) heart failure: Secondary | ICD-10-CM

## 2016-09-21 DIAGNOSIS — I441 Atrioventricular block, second degree: Secondary | ICD-10-CM

## 2016-09-21 DIAGNOSIS — I2510 Atherosclerotic heart disease of native coronary artery without angina pectoris: Secondary | ICD-10-CM

## 2016-09-21 MED ORDER — CLOPIDOGREL BISULFATE 75 MG PO TABS: 75.0000 mg | ORAL_TABLET | Freq: Every day | ORAL | 3 refills | Status: DC

## 2016-09-21 NOTE — Progress Notes (Signed)
09/21/2016 [name]   15-Dec-1931  [id number]  Primary Physician Jani Gravel, MD Primary Cardiologist: Dr. Meda Coffee  Electrophysiologist: Dr. Rayann Heman   Reason for Visit/CC: [facility] F/u for Symptomatic Bradycardia Requiring PPM implant and Acute on Chronic Diastolic HF  HPI:  [name] [name] is a 83 y.o. male who is being seen today for post hospital f/u. He has a h/o CAD s/p CABG in 2005 x 4 with low risk myoview in 2016. H/o aorta-bifem bypass as well. He was admitted to [facility] on 09/08/2016 for acute on chronic diastolic heart failure as well as symptomatic bradycardia with Mobitz 1 heart block. Per records, the patient has a long history of bradycardia however had refused PPM implantation in the past. He presented to the hospital with near syncope. Given his worsening symptoms and degree of bradycardia, he required implantation of a permanent pacemaker which was implanted by Dr. Caryl Comes on 09/12/16 (no etiologies identified, he ruled-out for ACS). The device is Medtronic. He had no postoperative complications.  His acute heart failure was treated with diuretics. Echocardiogram revealed normal left ventricular systolic function with an EF of 55-60% and grade 1 diastolic dysfunction.   He was discharged home and set up with a home health nurse. He presents to clinic today with his wife. He reports that he has done well since discharge. No further fatigue, dizziness, syncope/near-syncope. Device pocket is well-healed without any signs of infection. He denies any fever or chills. He has a wound check follow-up in EP clinic following this appointment. He denies chest pain. No dyspnea, orthopnea, PND or lower extremity edema. His home health nurse has been helping him check his weights daily. He reports that his weights have remained stable since discharge. He takes Lasix as needed. He knows that he has to take Lasix and call the office if he gains more than 3 pounds in a 24 hour period   Current  Meds  Medication Sig  . ANORO ELLIPTA 62.5-25 MCG/INH AEPB Inhale 1 puff into the lungs every morning.   Marland Kitchen aspirin 81 MG tablet Take 81 mg by mouth every morning.   Marland Kitchen atorvastatin (LIPITOR) 40 MG tablet Take 40 mg by mouth every morning.   . cholecalciferol (VITAMIN D) 1000 UNITS tablet Take 1,000 Units by mouth every morning.   . escitalopram (LEXAPRO) 5 MG tablet Take 5 mg by mouth every evening.   . furosemide (LASIX) 20 MG tablet Take 0.5 tablets (10 mg total) by mouth daily as needed for fluid or edema.  . gabapentin (NEURONTIN) 300 MG capsule Take 300 mg by mouth every morning.   . hydrALAZINE (APRESOLINE) 50 MG tablet Take 1 tablet (50 mg total) by mouth 3 (three) times daily.  . montelukast (SINGULAIR) 10 MG tablet Take 10 mg by mouth every evening.   Marland Kitchen oxybutynin (DITROPAN-XL) 5 MG 24 hr tablet Take 5 mg by mouth at bedtime.   Marland Kitchen PROAIR HFA 108 (90 BASE) MCG/ACT inhaler Inhale 2 puffs into the lungs every 4 (four) hours as needed for wheezing or shortness of breath.   . TRADJENTA 5 MG TABS tablet Take 5 mg by mouth every morning.  . traMADol (ULTRAM) 50 MG tablet Take 1 tablet by mouth every 6 (six) hours as needed for moderate pain.   . vitamin B-12 (CYANOCOBALAMIN) 100 MCG tablet Take 100 mcg by mouth every morning.   . zolpidem (AMBIEN) 10 MG tablet Take 5 mg by mouth at bedtime.    No Known Allergies Past  Medical History:  Diagnosis Date  . AAA (abdominal aortic aneurysm) [facility])    Surgical repair June, 2014  . CAD (coronary artery disease)   . Carotid artery occlusion   . Chronic renal insufficiency   . COPD (chronic obstructive pulmonary disease) ([place]) 08/15/2012  . CVA (cerebral vascular accident) ([place])    residual R leg weakness  . Diabetes mellitus without complication ([place])   . Dyslipidemia   . Fall at home Feb.4, 2015   Fx Right Hip  . HTN (hypertension)   . Hx of CABG    2005  . Leg pain   . Morbid obesity ([place])   . Neuropathy    Right foot  . Peripheral  vascular disease ([place])   . Renal artery stenosis ([place])   . Status post abdominal aortic aneurysm repair    June, 2014  . Tobacco abuse    quit 2014   Family History  Problem Relation Age of Onset  . Tuberculosis Mother 63  . Coronary artery disease Father   . Varicose Veins Father    Past Surgical History:  Procedure Laterality Date  . ABDOMINAL AORTAGRAM N/A 08/07/2012   Procedure: ABDOMINAL AORTAGRAM;  Surgeon: Rosetta Posner, MD;  Location: [facility] CATH LAB;  Service: Cardiovascular;  Laterality: N/A;  . AORTA - BILATERAL FEMORAL ARTERY BYPASS GRAFT N/A 08/09/2012   Procedure: AORTA BIFEMORAL BYPASS GRAFT;  Surgeon: Rosetta Posner, MD;  Location: [place];  Service: Vascular;  Laterality: N/A;  . CORONARY ARTERY BYPASS GRAFT  2005   x4  . HIP ARTHROPLASTY Right 04/09/2013   Procedure: ARTHROPLASTY BIPOLAR HIP;  Surgeon: Gearlean Alf, MD;  [facility]: [facility] ORS;  Service: Orthopedics;  Laterality: Right;  . JOINT REPLACEMENT Right Feb. 4, 2015   Hip- Pt fell  . PACEMAKER IMPLANT N/A 09/11/2016   Procedure: Pacemaker Implant;  Surgeon: Deboraha Sprang, MD;  Location: [place] CV LAB;  Service: Cardiovascular;  Laterality: N/A;  . SPINE SURGERY  1977   Lumbar HNP surgery   Social History   Social History  . Marital status: Married    Spouse name: N/A  . Number of children: N/A  . Years of education: N/A   Occupational History  . retired    Social History Main Topics  . Smoking status: Former Smoker    Packs/day: 0.50    Years: 50.00    Types: Cigarettes    Quit date: 08/05/2012  . Smokeless tobacco: Never Used  . Alcohol use Yes     Comment: occasionally  . Drug use: No  . Sexual activity: Not on file   Other Topics Concern  . Not on file   Social History Narrative   Pt lives in [place] with spouse.  Retired from [employer].     Review of Systems: General: negative for chills, fever, night sweats or weight changes.  Cardiovascular: negative for chest pain, dyspnea on exertion,  edema, orthopnea, palpitations, paroxysmal nocturnal dyspnea or shortness of breath Dermatological: negative for rash Respiratory: negative for cough or wheezing Urologic: negative for hematuria Abdominal: negative for nausea, vomiting, diarrhea, bright red blood per rectum, melena, or hematemesis Neurologic: negative for visual changes, syncope, or dizziness All other systems reviewed and are otherwise negative except as noted above.   Physical Exam:  Blood pressure 122/62, pulse 74, height 5\' 10"  (1.778 m), weight 214 lb (97.1 kg).  General appearance: alert, cooperative and no distress Neck: no carotid bruit and no JVD Lungs: clear to auscultation bilaterally Heart:  regular rate and rhythm, S1, S2 normal, no murmur, click, rub or gallop Extremities: extremities normal, atraumatic, no cyanosis or edema Pulses: 2+ and symmetric Skin: Skin color, texture, turgor normal. No rashes or lesions Neurologic: Grossly normal  EKG not performed -- personally reviewed   ASSESSMENT AND PLAN:   1. Symptomatic Bradycardia: s/p PPM 09/12/16. Medtronic. Symptoms resolved. No post surgical complications. Device pocket stable w/o signs of infection. He has wound clinic f/u today. This will be followed by Dr. Rayann Heman.   2. Chronic Diastolic HF: he required diuresis for acute CHF while admitted. Echo with normal LVEF and G1DD. Weights have been stable since discharge. He is euvolemic. No dyspnea. He takes PO lasix PRN based on weights. He has been compliant with daily weights and low sodium diet. Continue current regimen.   3. HTN: BP is well controlled on current regimen.   5. CAD: s/p CABG in 2005 x 4 with low risk myoview in 2016. Stable w/o CP. Continue ASA and Plavix and statin.   6. PVD: H/o aorta-bifem bypass. No symptoms. Continue ASA, Plavix and statin.    Follow-Up with Dr. Meda Coffee in 6 months. Dr. Rayann Heman to follow PPM.   Nelida Gores, MHS [facility] 09/21/2016 12:46 PM

## 2016-09-21 NOTE — Patient Instructions (Signed)
Medication Instructions:  Your physician has recommended you make the following change in your medication: 1.) START Plavix 75 mg daily.    Labwork: None Ordered   Testing/Procedures: None Ordered   Follow-Up: Your physician recommends that you schedule a follow-up appointment in: 4 months with Dr. Meda Coffee    Any Other Special Instructions Will Be Listed Below (If Applicable).     If you need a refill on your cardiac medications before your next appointment, please call your pharmacy.

## 2016-09-22 DIAGNOSIS — I130 Hypertensive heart and chronic kidney disease with heart failure and stage 1 through stage 4 chronic kidney disease, or unspecified chronic kidney disease: Secondary | ICD-10-CM | POA: Diagnosis not present

## 2016-09-22 DIAGNOSIS — E1151 Type 2 diabetes mellitus with diabetic peripheral angiopathy without gangrene: Secondary | ICD-10-CM | POA: Diagnosis not present

## 2016-09-22 DIAGNOSIS — E1122 Type 2 diabetes mellitus with diabetic chronic kidney disease: Secondary | ICD-10-CM | POA: Diagnosis not present

## 2016-09-22 DIAGNOSIS — Z48812 Encounter for surgical aftercare following surgery on the circulatory system: Secondary | ICD-10-CM | POA: Diagnosis not present

## 2016-09-22 DIAGNOSIS — I5033 Acute on chronic diastolic (congestive) heart failure: Secondary | ICD-10-CM | POA: Diagnosis not present

## 2016-09-22 DIAGNOSIS — I441 Atrioventricular block, second degree: Secondary | ICD-10-CM | POA: Diagnosis not present

## 2016-09-23 LAB — CUP PACEART INCLINIC DEVICE CHECK
Battery Remaining Longevity: 77 mo
Brady Statistic AP VS Percent: 0.01 %
Brady Statistic AS VS Percent: 1.79 %
Brady Statistic RV Percent Paced: 98.21 %
Implantable Lead Implant Date: 20180709
Implantable Lead Location: 753859
Implantable Lead Model: 5076
Lead Channel Impedance Value: 323 Ohm
Lead Channel Impedance Value: 399 Ohm
Lead Channel Impedance Value: 532 Ohm
Lead Channel Pacing Threshold Amplitude: 0.5 V
Lead Channel Pacing Threshold Pulse Width: 1 ms
Lead Channel Sensing Intrinsic Amplitude: 21.625 mV
Lead Channel Sensing Intrinsic Amplitude: 4.25 mV
Lead Channel Sensing Intrinsic Amplitude: 4.25 mV
Lead Channel Setting Pacing Amplitude: 3.5 V
MDC IDC LEAD IMPLANT DT: 20180709
MDC IDC LEAD LOCATION: 753859
MDC IDC MSMT BATTERY VOLTAGE: 3.19 V
MDC IDC MSMT LEADCHNL RA PACING THRESHOLD AMPLITUDE: 0.5 V
MDC IDC MSMT LEADCHNL RA PACING THRESHOLD PULSEWIDTH: 0.4 ms
MDC IDC MSMT LEADCHNL RV IMPEDANCE VALUE: 342 Ohm
MDC IDC MSMT LEADCHNL RV SENSING INTR AMPL: 18.375 mV
MDC IDC PG IMPLANT DT: 20180709
MDC IDC SESS DTM: 20180719150127
MDC IDC SET LEADCHNL RA PACING AMPLITUDE: 3.5 V
MDC IDC SET LEADCHNL RV PACING PULSEWIDTH: 1 ms
MDC IDC SET LEADCHNL RV SENSING SENSITIVITY: 2 mV
MDC IDC STAT BRADY AP VP PERCENT: 21.16 %
MDC IDC STAT BRADY AS VP PERCENT: 77.04 %
MDC IDC STAT BRADY RA PERCENT PACED: 21.8 %

## 2016-09-23 NOTE — Progress Notes (Signed)
Wound check appointment. Dermabond removed. Wound without redness or edema. Incision edges approximated, wound well healed. Normal device function. Thresholds, sensing, and impedances consistent with implant measurements. HBP test performed with use of rhythm strip, septal pacing noted from 6V to LOC. Device programmed at 3.5V for extra safety margin until 3 month visit. Histogram distribution appropriate for patient and level of activity. No mode switches or high ventricular rates noted. Patient educated about wound care, arm mobility, lifting restrictions. ROV in 6 weeks with AS and 3 months with JA.

## 2016-09-26 DIAGNOSIS — E1122 Type 2 diabetes mellitus with diabetic chronic kidney disease: Secondary | ICD-10-CM | POA: Diagnosis not present

## 2016-09-26 DIAGNOSIS — Z48812 Encounter for surgical aftercare following surgery on the circulatory system: Secondary | ICD-10-CM | POA: Diagnosis not present

## 2016-09-26 DIAGNOSIS — I441 Atrioventricular block, second degree: Secondary | ICD-10-CM | POA: Diagnosis not present

## 2016-09-26 DIAGNOSIS — E1151 Type 2 diabetes mellitus with diabetic peripheral angiopathy without gangrene: Secondary | ICD-10-CM | POA: Diagnosis not present

## 2016-09-26 DIAGNOSIS — I130 Hypertensive heart and chronic kidney disease with heart failure and stage 1 through stage 4 chronic kidney disease, or unspecified chronic kidney disease: Secondary | ICD-10-CM | POA: Diagnosis not present

## 2016-09-26 DIAGNOSIS — I5033 Acute on chronic diastolic (congestive) heart failure: Secondary | ICD-10-CM | POA: Diagnosis not present

## 2016-09-28 ENCOUNTER — Telehealth: Payer: Self-pay | Admitting: Internal Medicine

## 2016-09-28 DIAGNOSIS — I441 Atrioventricular block, second degree: Secondary | ICD-10-CM | POA: Diagnosis not present

## 2016-09-28 DIAGNOSIS — E1151 Type 2 diabetes mellitus with diabetic peripheral angiopathy without gangrene: Secondary | ICD-10-CM | POA: Diagnosis not present

## 2016-09-28 DIAGNOSIS — I5033 Acute on chronic diastolic (congestive) heart failure: Secondary | ICD-10-CM | POA: Diagnosis not present

## 2016-09-28 DIAGNOSIS — E1122 Type 2 diabetes mellitus with diabetic chronic kidney disease: Secondary | ICD-10-CM | POA: Diagnosis not present

## 2016-09-28 DIAGNOSIS — Z48812 Encounter for surgical aftercare following surgery on the circulatory system: Secondary | ICD-10-CM | POA: Diagnosis not present

## 2016-09-28 DIAGNOSIS — I130 Hypertensive heart and chronic kidney disease with heart failure and stage 1 through stage 4 chronic kidney disease, or unspecified chronic kidney disease: Secondary | ICD-10-CM | POA: Diagnosis not present

## 2016-09-28 NOTE — Telephone Encounter (Signed)
[name] ( [facility] ) is calling to notify you of a Increased blood pressure reading at 9am it was 191/90 pulse 77 and then 10:15am it was 181/98 and a pulse of 81. He did take his am medication . Please call   Thanks

## 2016-09-28 NOTE — Telephone Encounter (Signed)
Reviewed patient information with triage who confirmed the Dr. Meda Coffee was out of the office and there are currently no APP appointments available in the upcoming day or two. It seems that the patient's PCP has been primarily managing his HTN. I spoke with patients wife and encouraged her to reach out to her PCP for an appointment as waiting for Dr. Meda Coffee to return would create a delay in care. She verbalized understanding. She states that Physical Therapy was currently at the house and had checked his BP in both arms. She reports the following: (R) BP 138/80 and (L) 147/79 at 1330 today. She states that she has been taking his BP's in his left arm. I encouraged her to maintain consistency in measuring his BP. She verbalized understanding and will reach out to PCP. I will wait for remote transmission to be received and call back if any additional follow up is needed.

## 2016-09-28 NOTE — Telephone Encounter (Signed)
Reached out to patient's wife and had an extensive conversation about her concerns. She reports that patient had a headache last night with a blood pressure in the "160's" which prompted a call to a home health nurse who expressed concern with his BP.  She then reported the patient's blood pressure for this morning as follows: 0900 - 191/90, HR 77 1000 - 181/98, HR 81 1100 - 166/92, HR 68 1110 - 159/86, HR 63  Patient's wife confirms that he has now taken his lunch time dose of hydralazine and that she has not checked his BP since 1110.  I explained that the patient's pacemaker has a lower rate limit of 60bpm which will not allow [name] to go below 60bpm. I explained that it is completely normal for some HR fluctuation above 60 and that [name] readings respond appropriately to the medication he took and are exactly what we expect to see. I walked the patient's wife through how to send a  remote transmission and explained that once I receive it I will review it to confirm that his device is working appropriately. She verbalized understanding and appreciation.

## 2016-09-28 NOTE — Telephone Encounter (Signed)
Patient's wife, Izora Gala, reported that the patient took his Hydralazine 8:30am this morning and he was going to be taking is second dose for the day with his lunch.  She reported his BP and pulse readings were as follows:  9am - 160/98, pulse 77 10:15am - 181/98, pulse 81 11:00am - 166/92, pulse 68 11:15am - 159/86, pulse 63  Patient's wife call his PCP this morning. PCP told her that these various pulse rates didn't make sense because "the pace maker is set at 72".   Patient's wife is now calling me because she is concerned that cardiology will tell her to call her PCP and her PCP will continue to call her cardiologist.  I'm routing to Ria Clock, EP Device Tech, so that she can validate the settings for the patient's pacemaker. Upon confirming the settings, Cyril Mourning will contact the patient/his wife to follow up.

## 2016-09-28 NOTE — Telephone Encounter (Signed)
Home Health RN is calling to inform Dr Meda Coffee and myself that the pts current BP this morning is as 181/98 with meds on board.  Cindy RN with [facility] states that the pt is asymptomatic at this time, but earlier in the week, he complained of intermittent headache.  Jenny Reichmann RN states he had no other complaints, other than intermittent headaches.  No neuro deficits or cardiac complaints.  Cindy RN states that the pts weight has been within-normal-limits, and he has no LEE.  Only new med start was Plavix 75 mg po daily, and this was started at his last OV on 7/19, in our office, by [employer].  Jenny Reichmann RN states that she is unsure if this is stress related or if he's increased the salt in his diet.  Cindy RN wanted to run this by Dr Meda Coffee.  Informed Cindy RN that Dr Meda Coffee is out of the office this week and our APP schedule is full.  Maudry Mayhew RN of 2 options:  She can refer him to his PCP Dr. Maudie Mercury, who has managed his HTN before, or he can go to the ER, if pressure stays elevated and/or symptoms reoccur.  Per Jenny Reichmann RN, she agrees that the pt should refer to his PCP today or tomorrow for further eval.   Jenny Reichmann RN states that she will call and update our office if the pt goes to his PCP and what his current status is.  Jenny Reichmann RN states she will provide pt education to both pt and wife on a low sodium diet, and when to seek immediate medical assistance, if pressure worsens and symptoms reoccur.  Cindy RN verbalized understanding and agrees with this plan.  Will route this message to Dr Meda Coffee for her review and advise if needed.

## 2016-09-29 ENCOUNTER — Other Ambulatory Visit: Payer: Self-pay | Admitting: *Deleted

## 2016-09-29 ENCOUNTER — Telehealth: Payer: Self-pay | Admitting: Cardiology

## 2016-09-29 DIAGNOSIS — E1151 Type 2 diabetes mellitus with diabetic peripheral angiopathy without gangrene: Secondary | ICD-10-CM | POA: Diagnosis not present

## 2016-09-29 DIAGNOSIS — I130 Hypertensive heart and chronic kidney disease with heart failure and stage 1 through stage 4 chronic kidney disease, or unspecified chronic kidney disease: Secondary | ICD-10-CM | POA: Diagnosis not present

## 2016-09-29 DIAGNOSIS — I5033 Acute on chronic diastolic (congestive) heart failure: Secondary | ICD-10-CM | POA: Diagnosis not present

## 2016-09-29 DIAGNOSIS — E1122 Type 2 diabetes mellitus with diabetic chronic kidney disease: Secondary | ICD-10-CM | POA: Diagnosis not present

## 2016-09-29 DIAGNOSIS — Z48812 Encounter for surgical aftercare following surgery on the circulatory system: Secondary | ICD-10-CM | POA: Diagnosis not present

## 2016-09-29 DIAGNOSIS — I441 Atrioventricular block, second degree: Secondary | ICD-10-CM | POA: Diagnosis not present

## 2016-09-29 MED ORDER — AMLODIPINE BESYLATE 2.5 MG PO TABS: 2.5000 mg | ORAL_TABLET | Freq: Every day | ORAL | 3 refills | Status: DC

## 2016-09-29 NOTE — Telephone Encounter (Signed)
See note  From  09-07-16 Dr Meda Coffee started Amlodipine 2.5 mg every day   Pt's wife aware .Adonis Housekeeper

## 2016-09-29 NOTE — Telephone Encounter (Signed)
Melissa at [facility] notified of recommendations verbal order given.

## 2016-09-29 NOTE — Telephone Encounter (Signed)
Patient wife calling, states that she received a call that a prescription was called into patient pharmacy and patient does not know which one it was. Mrs. Labreck called [facility] and they stated that they did not have record of that. Mrs. Manley is asking to speak with Altha Harm in triage and she states "she knows whats going on."

## 2016-09-29 NOTE — Telephone Encounter (Signed)
Please try amlodipine 2.5 mg po daily for hypertension and watch for signs of low blood pressure such as dizziness.

## 2016-10-02 DIAGNOSIS — I5033 Acute on chronic diastolic (congestive) heart failure: Secondary | ICD-10-CM | POA: Diagnosis not present

## 2016-10-02 DIAGNOSIS — I130 Hypertensive heart and chronic kidney disease with heart failure and stage 1 through stage 4 chronic kidney disease, or unspecified chronic kidney disease: Secondary | ICD-10-CM | POA: Diagnosis not present

## 2016-10-02 DIAGNOSIS — Z48812 Encounter for surgical aftercare following surgery on the circulatory system: Secondary | ICD-10-CM | POA: Diagnosis not present

## 2016-10-02 DIAGNOSIS — I441 Atrioventricular block, second degree: Secondary | ICD-10-CM | POA: Diagnosis not present

## 2016-10-02 DIAGNOSIS — E1122 Type 2 diabetes mellitus with diabetic chronic kidney disease: Secondary | ICD-10-CM | POA: Diagnosis not present

## 2016-10-02 DIAGNOSIS — E1151 Type 2 diabetes mellitus with diabetic peripheral angiopathy without gangrene: Secondary | ICD-10-CM | POA: Diagnosis not present

## 2016-10-03 DIAGNOSIS — I130 Hypertensive heart and chronic kidney disease with heart failure and stage 1 through stage 4 chronic kidney disease, or unspecified chronic kidney disease: Secondary | ICD-10-CM | POA: Diagnosis not present

## 2016-10-03 DIAGNOSIS — Z48812 Encounter for surgical aftercare following surgery on the circulatory system: Secondary | ICD-10-CM | POA: Diagnosis not present

## 2016-10-03 DIAGNOSIS — E1122 Type 2 diabetes mellitus with diabetic chronic kidney disease: Secondary | ICD-10-CM | POA: Diagnosis not present

## 2016-10-03 DIAGNOSIS — E1151 Type 2 diabetes mellitus with diabetic peripheral angiopathy without gangrene: Secondary | ICD-10-CM | POA: Diagnosis not present

## 2016-10-03 DIAGNOSIS — I5033 Acute on chronic diastolic (congestive) heart failure: Secondary | ICD-10-CM | POA: Diagnosis not present

## 2016-10-03 DIAGNOSIS — I441 Atrioventricular block, second degree: Secondary | ICD-10-CM | POA: Diagnosis not present

## 2016-10-04 DIAGNOSIS — R001 Bradycardia, unspecified: Secondary | ICD-10-CM | POA: Diagnosis not present

## 2016-10-04 DIAGNOSIS — I10 Essential (primary) hypertension: Secondary | ICD-10-CM | POA: Diagnosis not present

## 2016-10-04 DIAGNOSIS — R1310 Dysphagia, unspecified: Secondary | ICD-10-CM | POA: Diagnosis not present

## 2016-10-04 DIAGNOSIS — Z950 Presence of cardiac pacemaker: Secondary | ICD-10-CM | POA: Diagnosis not present

## 2016-10-05 DIAGNOSIS — E1151 Type 2 diabetes mellitus with diabetic peripheral angiopathy without gangrene: Secondary | ICD-10-CM | POA: Diagnosis not present

## 2016-10-05 DIAGNOSIS — Z48812 Encounter for surgical aftercare following surgery on the circulatory system: Secondary | ICD-10-CM | POA: Diagnosis not present

## 2016-10-05 DIAGNOSIS — I5033 Acute on chronic diastolic (congestive) heart failure: Secondary | ICD-10-CM | POA: Diagnosis not present

## 2016-10-05 DIAGNOSIS — I130 Hypertensive heart and chronic kidney disease with heart failure and stage 1 through stage 4 chronic kidney disease, or unspecified chronic kidney disease: Secondary | ICD-10-CM | POA: Diagnosis not present

## 2016-10-05 DIAGNOSIS — E1122 Type 2 diabetes mellitus with diabetic chronic kidney disease: Secondary | ICD-10-CM | POA: Diagnosis not present

## 2016-10-05 DIAGNOSIS — I441 Atrioventricular block, second degree: Secondary | ICD-10-CM | POA: Diagnosis not present

## 2016-10-06 DIAGNOSIS — Z48812 Encounter for surgical aftercare following surgery on the circulatory system: Secondary | ICD-10-CM | POA: Diagnosis not present

## 2016-10-06 DIAGNOSIS — I441 Atrioventricular block, second degree: Secondary | ICD-10-CM | POA: Diagnosis not present

## 2016-10-06 DIAGNOSIS — E1151 Type 2 diabetes mellitus with diabetic peripheral angiopathy without gangrene: Secondary | ICD-10-CM | POA: Diagnosis not present

## 2016-10-06 DIAGNOSIS — I5033 Acute on chronic diastolic (congestive) heart failure: Secondary | ICD-10-CM | POA: Diagnosis not present

## 2016-10-06 DIAGNOSIS — E1122 Type 2 diabetes mellitus with diabetic chronic kidney disease: Secondary | ICD-10-CM | POA: Diagnosis not present

## 2016-10-06 DIAGNOSIS — I130 Hypertensive heart and chronic kidney disease with heart failure and stage 1 through stage 4 chronic kidney disease, or unspecified chronic kidney disease: Secondary | ICD-10-CM | POA: Diagnosis not present

## 2016-10-10 DIAGNOSIS — I130 Hypertensive heart and chronic kidney disease with heart failure and stage 1 through stage 4 chronic kidney disease, or unspecified chronic kidney disease: Secondary | ICD-10-CM | POA: Diagnosis not present

## 2016-10-10 DIAGNOSIS — I5033 Acute on chronic diastolic (congestive) heart failure: Secondary | ICD-10-CM | POA: Diagnosis not present

## 2016-10-10 DIAGNOSIS — I441 Atrioventricular block, second degree: Secondary | ICD-10-CM | POA: Diagnosis not present

## 2016-10-10 DIAGNOSIS — E1122 Type 2 diabetes mellitus with diabetic chronic kidney disease: Secondary | ICD-10-CM | POA: Diagnosis not present

## 2016-10-10 DIAGNOSIS — Z48812 Encounter for surgical aftercare following surgery on the circulatory system: Secondary | ICD-10-CM | POA: Diagnosis not present

## 2016-10-10 DIAGNOSIS — E1151 Type 2 diabetes mellitus with diabetic peripheral angiopathy without gangrene: Secondary | ICD-10-CM | POA: Diagnosis not present

## 2016-10-11 DIAGNOSIS — E1151 Type 2 diabetes mellitus with diabetic peripheral angiopathy without gangrene: Secondary | ICD-10-CM | POA: Diagnosis not present

## 2016-10-11 DIAGNOSIS — E1122 Type 2 diabetes mellitus with diabetic chronic kidney disease: Secondary | ICD-10-CM | POA: Diagnosis not present

## 2016-10-11 DIAGNOSIS — I5033 Acute on chronic diastolic (congestive) heart failure: Secondary | ICD-10-CM | POA: Diagnosis not present

## 2016-10-11 DIAGNOSIS — I130 Hypertensive heart and chronic kidney disease with heart failure and stage 1 through stage 4 chronic kidney disease, or unspecified chronic kidney disease: Secondary | ICD-10-CM | POA: Diagnosis not present

## 2016-10-11 DIAGNOSIS — Z48812 Encounter for surgical aftercare following surgery on the circulatory system: Secondary | ICD-10-CM | POA: Diagnosis not present

## 2016-10-11 DIAGNOSIS — I441 Atrioventricular block, second degree: Secondary | ICD-10-CM | POA: Diagnosis not present

## 2016-10-12 DIAGNOSIS — Z48812 Encounter for surgical aftercare following surgery on the circulatory system: Secondary | ICD-10-CM | POA: Diagnosis not present

## 2016-10-12 DIAGNOSIS — I130 Hypertensive heart and chronic kidney disease with heart failure and stage 1 through stage 4 chronic kidney disease, or unspecified chronic kidney disease: Secondary | ICD-10-CM | POA: Diagnosis not present

## 2016-10-12 DIAGNOSIS — I441 Atrioventricular block, second degree: Secondary | ICD-10-CM | POA: Diagnosis not present

## 2016-10-12 DIAGNOSIS — E1122 Type 2 diabetes mellitus with diabetic chronic kidney disease: Secondary | ICD-10-CM | POA: Diagnosis not present

## 2016-10-12 DIAGNOSIS — I5033 Acute on chronic diastolic (congestive) heart failure: Secondary | ICD-10-CM | POA: Diagnosis not present

## 2016-10-12 DIAGNOSIS — E1151 Type 2 diabetes mellitus with diabetic peripheral angiopathy without gangrene: Secondary | ICD-10-CM | POA: Diagnosis not present

## 2016-10-16 DIAGNOSIS — E1122 Type 2 diabetes mellitus with diabetic chronic kidney disease: Secondary | ICD-10-CM | POA: Diagnosis not present

## 2016-10-16 DIAGNOSIS — I130 Hypertensive heart and chronic kidney disease with heart failure and stage 1 through stage 4 chronic kidney disease, or unspecified chronic kidney disease: Secondary | ICD-10-CM | POA: Diagnosis not present

## 2016-10-16 DIAGNOSIS — I441 Atrioventricular block, second degree: Secondary | ICD-10-CM | POA: Diagnosis not present

## 2016-10-16 DIAGNOSIS — E1151 Type 2 diabetes mellitus with diabetic peripheral angiopathy without gangrene: Secondary | ICD-10-CM | POA: Diagnosis not present

## 2016-10-16 DIAGNOSIS — Z48812 Encounter for surgical aftercare following surgery on the circulatory system: Secondary | ICD-10-CM | POA: Diagnosis not present

## 2016-10-16 DIAGNOSIS — I5033 Acute on chronic diastolic (congestive) heart failure: Secondary | ICD-10-CM | POA: Diagnosis not present

## 2016-10-18 DIAGNOSIS — I130 Hypertensive heart and chronic kidney disease with heart failure and stage 1 through stage 4 chronic kidney disease, or unspecified chronic kidney disease: Secondary | ICD-10-CM | POA: Diagnosis not present

## 2016-10-18 DIAGNOSIS — E1122 Type 2 diabetes mellitus with diabetic chronic kidney disease: Secondary | ICD-10-CM | POA: Diagnosis not present

## 2016-10-18 DIAGNOSIS — I441 Atrioventricular block, second degree: Secondary | ICD-10-CM | POA: Diagnosis not present

## 2016-10-18 DIAGNOSIS — E1151 Type 2 diabetes mellitus with diabetic peripheral angiopathy without gangrene: Secondary | ICD-10-CM | POA: Diagnosis not present

## 2016-10-18 DIAGNOSIS — I5033 Acute on chronic diastolic (congestive) heart failure: Secondary | ICD-10-CM | POA: Diagnosis not present

## 2016-10-18 DIAGNOSIS — Z48812 Encounter for surgical aftercare following surgery on the circulatory system: Secondary | ICD-10-CM | POA: Diagnosis not present

## 2016-10-19 DIAGNOSIS — I5033 Acute on chronic diastolic (congestive) heart failure: Secondary | ICD-10-CM | POA: Diagnosis not present

## 2016-10-19 DIAGNOSIS — Z48812 Encounter for surgical aftercare following surgery on the circulatory system: Secondary | ICD-10-CM | POA: Diagnosis not present

## 2016-10-19 DIAGNOSIS — E1151 Type 2 diabetes mellitus with diabetic peripheral angiopathy without gangrene: Secondary | ICD-10-CM | POA: Diagnosis not present

## 2016-10-19 DIAGNOSIS — I441 Atrioventricular block, second degree: Secondary | ICD-10-CM | POA: Diagnosis not present

## 2016-10-19 DIAGNOSIS — E1122 Type 2 diabetes mellitus with diabetic chronic kidney disease: Secondary | ICD-10-CM | POA: Diagnosis not present

## 2016-10-19 DIAGNOSIS — I130 Hypertensive heart and chronic kidney disease with heart failure and stage 1 through stage 4 chronic kidney disease, or unspecified chronic kidney disease: Secondary | ICD-10-CM | POA: Diagnosis not present

## 2016-10-24 DIAGNOSIS — N184 Chronic kidney disease, stage 4 (severe): Secondary | ICD-10-CM | POA: Diagnosis not present

## 2016-10-25 DIAGNOSIS — I5033 Acute on chronic diastolic (congestive) heart failure: Secondary | ICD-10-CM | POA: Diagnosis not present

## 2016-10-25 DIAGNOSIS — E1151 Type 2 diabetes mellitus with diabetic peripheral angiopathy without gangrene: Secondary | ICD-10-CM | POA: Diagnosis not present

## 2016-10-25 DIAGNOSIS — Z48812 Encounter for surgical aftercare following surgery on the circulatory system: Secondary | ICD-10-CM | POA: Diagnosis not present

## 2016-10-25 DIAGNOSIS — E1122 Type 2 diabetes mellitus with diabetic chronic kidney disease: Secondary | ICD-10-CM | POA: Diagnosis not present

## 2016-10-25 DIAGNOSIS — I441 Atrioventricular block, second degree: Secondary | ICD-10-CM | POA: Diagnosis not present

## 2016-10-25 DIAGNOSIS — I130 Hypertensive heart and chronic kidney disease with heart failure and stage 1 through stage 4 chronic kidney disease, or unspecified chronic kidney disease: Secondary | ICD-10-CM | POA: Diagnosis not present

## 2016-10-26 DIAGNOSIS — I2510 Atherosclerotic heart disease of native coronary artery without angina pectoris: Secondary | ICD-10-CM | POA: Diagnosis not present

## 2016-10-26 DIAGNOSIS — I441 Atrioventricular block, second degree: Secondary | ICD-10-CM | POA: Diagnosis not present

## 2016-10-26 DIAGNOSIS — I739 Peripheral vascular disease, unspecified: Secondary | ICD-10-CM | POA: Diagnosis not present

## 2016-10-26 DIAGNOSIS — I714 Abdominal aortic aneurysm, without rupture: Secondary | ICD-10-CM | POA: Diagnosis not present

## 2016-10-26 DIAGNOSIS — N2581 Secondary hyperparathyroidism of renal origin: Secondary | ICD-10-CM | POA: Diagnosis not present

## 2016-10-26 DIAGNOSIS — E1121 Type 2 diabetes mellitus with diabetic nephropathy: Secondary | ICD-10-CM | POA: Diagnosis not present

## 2016-10-26 DIAGNOSIS — I10 Essential (primary) hypertension: Secondary | ICD-10-CM | POA: Diagnosis not present

## 2016-10-26 DIAGNOSIS — D509 Iron deficiency anemia, unspecified: Secondary | ICD-10-CM | POA: Diagnosis not present

## 2016-10-26 DIAGNOSIS — E785 Hyperlipidemia, unspecified: Secondary | ICD-10-CM | POA: Diagnosis not present

## 2016-10-26 DIAGNOSIS — N184 Chronic kidney disease, stage 4 (severe): Secondary | ICD-10-CM | POA: Diagnosis not present

## 2016-10-26 DIAGNOSIS — E669 Obesity, unspecified: Secondary | ICD-10-CM | POA: Diagnosis not present

## 2016-10-26 DIAGNOSIS — D472 Monoclonal gammopathy: Secondary | ICD-10-CM | POA: Diagnosis not present

## 2016-10-31 DIAGNOSIS — E1122 Type 2 diabetes mellitus with diabetic chronic kidney disease: Secondary | ICD-10-CM | POA: Diagnosis not present

## 2016-10-31 DIAGNOSIS — I441 Atrioventricular block, second degree: Secondary | ICD-10-CM | POA: Diagnosis not present

## 2016-10-31 DIAGNOSIS — I130 Hypertensive heart and chronic kidney disease with heart failure and stage 1 through stage 4 chronic kidney disease, or unspecified chronic kidney disease: Secondary | ICD-10-CM | POA: Diagnosis not present

## 2016-10-31 DIAGNOSIS — Z48812 Encounter for surgical aftercare following surgery on the circulatory system: Secondary | ICD-10-CM | POA: Diagnosis not present

## 2016-10-31 DIAGNOSIS — E1151 Type 2 diabetes mellitus with diabetic peripheral angiopathy without gangrene: Secondary | ICD-10-CM | POA: Diagnosis not present

## 2016-10-31 DIAGNOSIS — I5033 Acute on chronic diastolic (congestive) heart failure: Secondary | ICD-10-CM | POA: Diagnosis not present

## 2016-11-02 DIAGNOSIS — L602 Onychogryphosis: Secondary | ICD-10-CM | POA: Diagnosis not present

## 2016-11-02 DIAGNOSIS — M79671 Pain in right foot: Secondary | ICD-10-CM | POA: Diagnosis not present

## 2016-11-02 DIAGNOSIS — E1351 Other specified diabetes mellitus with diabetic peripheral angiopathy without gangrene: Secondary | ICD-10-CM | POA: Diagnosis not present

## 2016-11-02 DIAGNOSIS — M6281 Muscle weakness (generalized): Secondary | ICD-10-CM | POA: Diagnosis not present

## 2016-11-02 DIAGNOSIS — M79672 Pain in left foot: Secondary | ICD-10-CM | POA: Diagnosis not present

## 2016-11-02 DIAGNOSIS — I739 Peripheral vascular disease, unspecified: Secondary | ICD-10-CM | POA: Diagnosis not present

## 2016-11-02 NOTE — Progress Notes (Signed)
Electrophysiology Office Note Date: 11/03/2016  ID:  [name] [name], DOB February 28, 1932, MRN [id number]  PCP: [name] Gravel, MD Primary Cardiologist: [name] [name] Electrophysiologist: [facility]  CC: 6 week HIS Bundle pacemaker follow-up  [name] [name] is a 83 y.o. male seen today for Dr [name] [name].  He presents today for routine electrophysiology followup.  Since last being seen in our clinic, the patient reports doing relatively well.  He has had no further dizziness and feels that he has more energy post pacemaker implant. He is still seeing physical therapy in his home. His wife gives prn Lasix for edema.  He denies chest pain, palpitations, dyspnea, PND, orthopnea, nausea, vomiting, dizziness, syncope, weight gain, or early satiety.  Device History: MDT dual chamber HIS bundle PPM implanted 2018 for sinus node dysfunction and AV block    Past Medical History:  Diagnosis Date  . AAA (abdominal aortic aneurysm) [facility])    Surgical repair June, 2014  . CAD (coronary artery disease)   . Carotid artery occlusion   . Chronic renal insufficiency   . COPD (chronic obstructive pulmonary disease) ([name]) 08/15/2012  . CVA (cerebral vascular accident) ([name])    residual R leg weakness  . Diabetes mellitus without complication ([name])   . Dyslipidemia   . Fall at home Feb.4, 2015   Fx Right Hip  . HTN (hypertension)   . Hx of CABG    2005  . Morbid obesity ([name])   . Neuropathy    Right foot  . Peripheral vascular disease ([name])   . Renal artery stenosis ([name])   . Status post abdominal aortic aneurysm repair    June, 2014  . Tobacco abuse    quit 2014   Past Surgical History:  Procedure Laterality Date  . ABDOMINAL AORTAGRAM N/A 08/07/2012   Procedure: ABDOMINAL AORTAGRAM;  Surgeon: Rosetta Posner, MD;  Location: [facility] CATH LAB;  Service: Cardiovascular;  Laterality: N/A;  . AORTA - BILATERAL FEMORAL ARTERY BYPASS GRAFT N/A 08/09/2012   Procedure: AORTA BIFEMORAL BYPASS GRAFT;  Surgeon: Rosetta Posner, MD;   Location: [facility];  Service: Vascular;  Laterality: N/A;  . CORONARY ARTERY BYPASS GRAFT  2005   x4  . HIP ARTHROPLASTY Right 04/09/2013   Procedure: ARTHROPLASTY BIPOLAR HIP;  Surgeon: Gearlean Alf, MD;  [facility];  Service: Orthopedics;  Laterality: Right;  . JOINT REPLACEMENT Right Feb. 4, 2015   Hip- Pt fell  . PACEMAKER IMPLANT N/A 09/11/2016   Procedure: Pacemaker Implant;  Surgeon: Deboraha Sprang, MD;  Location: [place] CV LAB;  Service: Cardiovascular;  Laterality: N/A;  . SPINE SURGERY  1977   Lumbar HNP surgery    Current Outpatient Prescriptions  Medication Sig Dispense Refill  . amLODipine (NORVASC) 5 MG tablet Take 5 mg by mouth daily.    [name] ELLIPTA 62.5-25 MCG/INH AEPB Inhale 1 puff into the lungs every morning.     [name] aspirin 81 MG tablet Take 81 mg by mouth every morning.     [name] atorvastatin (LIPITOR) 40 MG tablet Take 40 mg by mouth every morning.     . cholecalciferol (VITAMIN D) 1000 UNITS tablet Take 1,000 Units by mouth every morning.     . escitalopram (LEXAPRO) 5 MG tablet Take 5 mg by mouth every evening.     . furosemide (LASIX) 20 MG tablet Take 0.5 tablets (10 mg total) by mouth daily as needed for fluid or edema. 30 tablet 3  . gabapentin (NEURONTIN) 300 MG  capsule Take 300 mg by mouth every morning.     . hydrALAZINE (APRESOLINE) 50 MG tablet Take 1 tablet (50 mg total) by mouth 3 (three) times daily. 90 tablet 6  . montelukast (SINGULAIR) 10 MG tablet Take 10 mg by mouth every evening.     [name] [name] oxybutynin (DITROPAN-XL) 5 MG 24 hr tablet Take 5 mg by mouth at bedtime.     [name] PROAIR HFA 108 (90 BASE) MCG/ACT inhaler Inhale 2 puffs into the lungs every 4 (four) hours as needed for wheezing or shortness of breath.     . TRADJENTA 5 MG TABS tablet Take 5 mg by mouth every morning.    . traMADol (ULTRAM) 50 MG tablet Take 1 tablet by mouth every 6 (six) hours as needed for moderate pain.     . vitamin B-12 (CYANOCOBALAMIN) 100 MCG tablet Take 100 mcg by  mouth every morning.     . zolpidem (AMBIEN) 10 MG tablet Take 5 mg by mouth at bedtime.      No current facility-administered medications for this visit.     Allergies:   Patient has no known allergies.   Social History: Social History   Social History  . Marital status: Married    Spouse name: N/A  . Number of children: N/A  . Years of education: N/A   Occupational History  . retired    Social History Main Topics  . Smoking status: Former Smoker    Packs/day: 0.50    Years: 50.00    Types: Cigarettes    Quit date: 08/05/2012  . Smokeless tobacco: Never Used  . Alcohol use Yes     Comment: occasionally  . Drug use: No  . Sexual activity: Not on file   Other Topics Concern  . Not on file   Social History Narrative   Pt lives in [name] with spouse.  Retired from [employer].    Family History: Family History  Problem Relation Age of Onset  . Tuberculosis Mother 4  . Coronary artery disease Father   . Varicose Veins Father      Review of Systems: All other systems reviewed and are otherwise negative except as noted above.   Physical Exam: VS:  BP 110/70   Pulse 64   Ht 5\' 10"  (1.778 m)   Wt 215 lb (97.5 kg)   SpO2 96%   BMI 30.85 kg/m  , BMI Body mass index is 30.85 kg/m.  GEN- The patient is elderly appearing, alert and oriented x 3 today.   HEENT: normocephalic, atraumatic; sclera clear, conjunctiva pink; hearing intact; oropharynx clear; neck supple  Lungs- Clear to ausculation bilaterally, normal work of breathing.  No wheezes, rales, rhonchi Heart- Regular rate and rhythm (paced) GI- soft, non-tender, non-distended  Extremities- no clubbing, cyanosis, or edema  MS- no significant deformity or atrophy Skin- warm and dry, no rash or lesion; PPM pocket well healed Psych- euthymic mood, full affect Neuro- strength and sensation are intact  PPM Interrogation- reviewed in detail today,  See PACEART report  EKG:  EKG is ordered today. The ekg  ordered today shows sinus rhythm with V pacing  Recent Labs: 09/08/2016: ALT 17; B Natriuretic Peptide 724.6; Magnesium 2.0; TSH 3.351 09/10/2016: Hemoglobin 11.6; Platelets 191 09/12/2016: BUN 44; Creatinine, Ser 2.95; Potassium 3.6; Sodium 137   Wt Readings from Last 3 Encounters:  11/03/16 215 lb (97.5 kg)  09/21/16 214 lb (97.1 kg)  09/12/16 213 lb 9.6 oz (96.9 kg)  Other studies Reviewed: Additional studies/ records that were reviewed today include: office notes, hospital records  Assessment and Plan:  1.  Sinus node dysfunction, AV block Normal PPM function See Pace Art report No changes today HIS Bundle capture septal pacing down to loss of capture. I did not make programming changes today but could consider fixing AV delays to allow intrinsic conduction and have narrower QRS/decreasing V pacing percentage (pt with Mobitz I heart block today).   2.  Chronic diastolic heart failure Stable No change required today  3.  HTN Stable No change required today   Current medicines are reviewed at length with the patient today.   The patient does not have concerns regarding his medicines.  The following changes were made today:  none  Labs/ tests ordered today include: none Orders Placed This Encounter  Procedures  . CUP PACEART INCLINIC DEVICE CHECK     Disposition:   Follow up with Dr [name] [name] as scheduled    Signed, Chanetta Marshall, NP 11/03/2016 12:09 PM  [facility] [address] [place] [address] [phone] (office) [phone] (fax)

## 2016-11-03 ENCOUNTER — Encounter: Payer: Self-pay | Admitting: Nurse Practitioner

## 2016-11-03 ENCOUNTER — Ambulatory Visit (INDEPENDENT_AMBULATORY_CARE_PROVIDER_SITE_OTHER): Payer: Medicare Other | Admitting: Nurse Practitioner

## 2016-11-03 VITALS — BP 110/70 | HR 64 | Ht 70.0 in | Wt 215.0 lb

## 2016-11-03 DIAGNOSIS — I10 Essential (primary) hypertension: Secondary | ICD-10-CM

## 2016-11-03 DIAGNOSIS — E1151 Type 2 diabetes mellitus with diabetic peripheral angiopathy without gangrene: Secondary | ICD-10-CM | POA: Diagnosis not present

## 2016-11-03 DIAGNOSIS — I5032 Chronic diastolic (congestive) heart failure: Secondary | ICD-10-CM | POA: Diagnosis not present

## 2016-11-03 DIAGNOSIS — I2510 Atherosclerotic heart disease of native coronary artery without angina pectoris: Secondary | ICD-10-CM

## 2016-11-03 DIAGNOSIS — I441 Atrioventricular block, second degree: Secondary | ICD-10-CM | POA: Diagnosis not present

## 2016-11-03 DIAGNOSIS — E1122 Type 2 diabetes mellitus with diabetic chronic kidney disease: Secondary | ICD-10-CM | POA: Diagnosis not present

## 2016-11-03 DIAGNOSIS — I5033 Acute on chronic diastolic (congestive) heart failure: Secondary | ICD-10-CM | POA: Diagnosis not present

## 2016-11-03 DIAGNOSIS — Z48812 Encounter for surgical aftercare following surgery on the circulatory system: Secondary | ICD-10-CM | POA: Diagnosis not present

## 2016-11-03 DIAGNOSIS — I130 Hypertensive heart and chronic kidney disease with heart failure and stage 1 through stage 4 chronic kidney disease, or unspecified chronic kidney disease: Secondary | ICD-10-CM | POA: Diagnosis not present

## 2016-11-03 LAB — CUP PACEART INCLINIC DEVICE CHECK
Date Time Interrogation Session: 20180831112537
Implantable Lead Model: 3830
Implantable Lead Model: 5076
MDC IDC LEAD IMPLANT DT: 20180709
MDC IDC LEAD IMPLANT DT: 20180709
MDC IDC LEAD LOCATION: 753859
MDC IDC LEAD LOCATION: 753859
MDC IDC PG IMPLANT DT: 20180709

## 2016-11-03 NOTE — Patient Instructions (Signed)
Medication Instructions:  Your physician recommends that you continue on your current medications as directed. Please refer to the Current Medication list given to you today.   Labwork: None Ordered   Testing/Procedures: None Ordered   Follow-Up: Your physician recommends that you schedule a follow-up appointment in: 6 weeks with Dr. Rayann Heman.  Any Other Special Instructions Will Be Listed Below (If Applicable).     If you need a refill on your cardiac medications before your next appointment, please call your pharmacy.

## 2016-11-09 DIAGNOSIS — E1122 Type 2 diabetes mellitus with diabetic chronic kidney disease: Secondary | ICD-10-CM | POA: Diagnosis not present

## 2016-11-09 DIAGNOSIS — I130 Hypertensive heart and chronic kidney disease with heart failure and stage 1 through stage 4 chronic kidney disease, or unspecified chronic kidney disease: Secondary | ICD-10-CM | POA: Diagnosis not present

## 2016-11-09 DIAGNOSIS — I5033 Acute on chronic diastolic (congestive) heart failure: Secondary | ICD-10-CM | POA: Diagnosis not present

## 2016-11-09 DIAGNOSIS — E1151 Type 2 diabetes mellitus with diabetic peripheral angiopathy without gangrene: Secondary | ICD-10-CM | POA: Diagnosis not present

## 2016-11-09 DIAGNOSIS — E119 Type 2 diabetes mellitus without complications: Secondary | ICD-10-CM | POA: Diagnosis not present

## 2016-11-09 DIAGNOSIS — Z48812 Encounter for surgical aftercare following surgery on the circulatory system: Secondary | ICD-10-CM | POA: Diagnosis not present

## 2016-11-09 DIAGNOSIS — I441 Atrioventricular block, second degree: Secondary | ICD-10-CM | POA: Diagnosis not present

## 2016-11-09 DIAGNOSIS — I10 Essential (primary) hypertension: Secondary | ICD-10-CM | POA: Diagnosis not present

## 2016-11-10 ENCOUNTER — Telehealth: Payer: Self-pay | Admitting: Cardiology

## 2016-11-10 DIAGNOSIS — I5033 Acute on chronic diastolic (congestive) heart failure: Secondary | ICD-10-CM | POA: Diagnosis not present

## 2016-11-10 DIAGNOSIS — I130 Hypertensive heart and chronic kidney disease with heart failure and stage 1 through stage 4 chronic kidney disease, or unspecified chronic kidney disease: Secondary | ICD-10-CM | POA: Diagnosis not present

## 2016-11-10 DIAGNOSIS — I441 Atrioventricular block, second degree: Secondary | ICD-10-CM | POA: Diagnosis not present

## 2016-11-10 DIAGNOSIS — E1122 Type 2 diabetes mellitus with diabetic chronic kidney disease: Secondary | ICD-10-CM | POA: Diagnosis not present

## 2016-11-10 DIAGNOSIS — E1151 Type 2 diabetes mellitus with diabetic peripheral angiopathy without gangrene: Secondary | ICD-10-CM | POA: Diagnosis not present

## 2016-11-10 DIAGNOSIS — Z48812 Encounter for surgical aftercare following surgery on the circulatory system: Secondary | ICD-10-CM | POA: Diagnosis not present

## 2016-11-10 NOTE — Telephone Encounter (Signed)
New message   [facility] calling for nursing order: 1 time per week for 2 weeks is what they need

## 2016-11-10 NOTE — Telephone Encounter (Signed)
Amanda with [facility] called in to report that she incorrectly called the wrong MD office, for further orders to have a RN come into the pts home.  Per Estill Bamberg, she meant to call the pts PCP, who currently follows the pts home health orders. Estill Bamberg apologized for the incorrect call, and appreciated the prompt call back.

## 2016-11-14 DIAGNOSIS — I130 Hypertensive heart and chronic kidney disease with heart failure and stage 1 through stage 4 chronic kidney disease, or unspecified chronic kidney disease: Secondary | ICD-10-CM | POA: Diagnosis not present

## 2016-11-14 DIAGNOSIS — I5033 Acute on chronic diastolic (congestive) heart failure: Secondary | ICD-10-CM | POA: Diagnosis not present

## 2016-11-14 DIAGNOSIS — E1122 Type 2 diabetes mellitus with diabetic chronic kidney disease: Secondary | ICD-10-CM | POA: Diagnosis not present

## 2016-11-14 DIAGNOSIS — Z48812 Encounter for surgical aftercare following surgery on the circulatory system: Secondary | ICD-10-CM | POA: Diagnosis not present

## 2016-11-14 DIAGNOSIS — I441 Atrioventricular block, second degree: Secondary | ICD-10-CM | POA: Diagnosis not present

## 2016-11-14 DIAGNOSIS — E1151 Type 2 diabetes mellitus with diabetic peripheral angiopathy without gangrene: Secondary | ICD-10-CM | POA: Diagnosis not present

## 2016-11-15 DIAGNOSIS — Z48812 Encounter for surgical aftercare following surgery on the circulatory system: Secondary | ICD-10-CM | POA: Diagnosis not present

## 2016-11-15 DIAGNOSIS — I441 Atrioventricular block, second degree: Secondary | ICD-10-CM | POA: Diagnosis not present

## 2016-11-15 DIAGNOSIS — I130 Hypertensive heart and chronic kidney disease with heart failure and stage 1 through stage 4 chronic kidney disease, or unspecified chronic kidney disease: Secondary | ICD-10-CM | POA: Diagnosis not present

## 2016-11-15 DIAGNOSIS — E1151 Type 2 diabetes mellitus with diabetic peripheral angiopathy without gangrene: Secondary | ICD-10-CM | POA: Diagnosis not present

## 2016-11-15 DIAGNOSIS — I5033 Acute on chronic diastolic (congestive) heart failure: Secondary | ICD-10-CM | POA: Diagnosis not present

## 2016-11-15 DIAGNOSIS — E1122 Type 2 diabetes mellitus with diabetic chronic kidney disease: Secondary | ICD-10-CM | POA: Diagnosis not present

## 2016-11-16 DIAGNOSIS — E118 Type 2 diabetes mellitus with unspecified complications: Secondary | ICD-10-CM | POA: Diagnosis not present

## 2016-11-16 DIAGNOSIS — I10 Essential (primary) hypertension: Secondary | ICD-10-CM | POA: Diagnosis not present

## 2016-11-22 DIAGNOSIS — E1122 Type 2 diabetes mellitus with diabetic chronic kidney disease: Secondary | ICD-10-CM | POA: Diagnosis not present

## 2016-11-22 DIAGNOSIS — I130 Hypertensive heart and chronic kidney disease with heart failure and stage 1 through stage 4 chronic kidney disease, or unspecified chronic kidney disease: Secondary | ICD-10-CM | POA: Diagnosis not present

## 2016-11-22 DIAGNOSIS — Z48812 Encounter for surgical aftercare following surgery on the circulatory system: Secondary | ICD-10-CM | POA: Diagnosis not present

## 2016-11-22 DIAGNOSIS — I441 Atrioventricular block, second degree: Secondary | ICD-10-CM | POA: Diagnosis not present

## 2016-11-22 DIAGNOSIS — I5033 Acute on chronic diastolic (congestive) heart failure: Secondary | ICD-10-CM | POA: Diagnosis not present

## 2016-11-22 DIAGNOSIS — E1151 Type 2 diabetes mellitus with diabetic peripheral angiopathy without gangrene: Secondary | ICD-10-CM | POA: Diagnosis not present

## 2016-12-01 DIAGNOSIS — I441 Atrioventricular block, second degree: Secondary | ICD-10-CM | POA: Diagnosis not present

## 2016-12-01 DIAGNOSIS — Z48812 Encounter for surgical aftercare following surgery on the circulatory system: Secondary | ICD-10-CM | POA: Diagnosis not present

## 2016-12-01 DIAGNOSIS — I5033 Acute on chronic diastolic (congestive) heart failure: Secondary | ICD-10-CM | POA: Diagnosis not present

## 2016-12-01 DIAGNOSIS — E1151 Type 2 diabetes mellitus with diabetic peripheral angiopathy without gangrene: Secondary | ICD-10-CM | POA: Diagnosis not present

## 2016-12-01 DIAGNOSIS — I130 Hypertensive heart and chronic kidney disease with heart failure and stage 1 through stage 4 chronic kidney disease, or unspecified chronic kidney disease: Secondary | ICD-10-CM | POA: Diagnosis not present

## 2016-12-01 DIAGNOSIS — E1122 Type 2 diabetes mellitus with diabetic chronic kidney disease: Secondary | ICD-10-CM | POA: Diagnosis not present

## 2016-12-13 DIAGNOSIS — I441 Atrioventricular block, second degree: Secondary | ICD-10-CM | POA: Diagnosis not present

## 2016-12-13 DIAGNOSIS — I130 Hypertensive heart and chronic kidney disease with heart failure and stage 1 through stage 4 chronic kidney disease, or unspecified chronic kidney disease: Secondary | ICD-10-CM | POA: Diagnosis not present

## 2016-12-13 DIAGNOSIS — I5033 Acute on chronic diastolic (congestive) heart failure: Secondary | ICD-10-CM | POA: Diagnosis not present

## 2016-12-13 DIAGNOSIS — Z48812 Encounter for surgical aftercare following surgery on the circulatory system: Secondary | ICD-10-CM | POA: Diagnosis not present

## 2016-12-13 DIAGNOSIS — E1151 Type 2 diabetes mellitus with diabetic peripheral angiopathy without gangrene: Secondary | ICD-10-CM | POA: Diagnosis not present

## 2016-12-13 DIAGNOSIS — E1122 Type 2 diabetes mellitus with diabetic chronic kidney disease: Secondary | ICD-10-CM | POA: Diagnosis not present

## 2016-12-15 ENCOUNTER — Ambulatory Visit (INDEPENDENT_AMBULATORY_CARE_PROVIDER_SITE_OTHER): Payer: Medicare Other | Admitting: Internal Medicine

## 2016-12-15 ENCOUNTER — Encounter: Payer: Self-pay | Admitting: Internal Medicine

## 2016-12-15 VITALS — BP 142/64 | HR 85 | Ht 70.0 in | Wt 215.0 lb

## 2016-12-15 DIAGNOSIS — I10 Essential (primary) hypertension: Secondary | ICD-10-CM | POA: Diagnosis not present

## 2016-12-15 DIAGNOSIS — I441 Atrioventricular block, second degree: Secondary | ICD-10-CM

## 2016-12-15 DIAGNOSIS — I2510 Atherosclerotic heart disease of native coronary artery without angina pectoris: Secondary | ICD-10-CM

## 2016-12-15 DIAGNOSIS — Z23 Encounter for immunization: Secondary | ICD-10-CM | POA: Diagnosis not present

## 2016-12-15 NOTE — Progress Notes (Signed)
PCP: Jani Gravel, MD Primary Cardiologist:  Dr Meda Coffee Primary EP:  Dr Rayann Heman  [name] [name] is a 83 y.o. male who presents today for routine electrophysiology followup.  Since device pacemaker by Dr Caryl Comes, the patient reports doing very well.  He has not noticed much improvement s/p PPM.  He continues to have SOB.  Today, he denies symptoms of palpitations, chest pain,  lower extremity edema, dizziness, presyncope, or syncope.  He is chronically debility and not very active.  The patient is otherwise without complaint today.   Past Medical History:  Diagnosis Date  . AAA (abdominal aortic aneurysm) [facility])    Surgical repair June, 2014  . CAD (coronary artery disease)   . Carotid artery occlusion   . Chronic renal insufficiency   . COPD (chronic obstructive pulmonary disease) ([place]) 08/15/2012  . CVA (cerebral vascular accident) ([place])    residual R leg weakness  . Diabetes mellitus without complication ([place])   . Dyslipidemia   . Fall at home Feb.4, 2015   Fx Right Hip  . HTN (hypertension)   . Hx of CABG    2005  . Morbid obesity ([place])   . Neuropathy    Right foot  . Peripheral vascular disease ([place])   . Renal artery stenosis ([place])   . Status post abdominal aortic aneurysm repair    June, 2014  . Tobacco abuse    quit 2014   Past Surgical History:  Procedure Laterality Date  . ABDOMINAL AORTAGRAM N/A 08/07/2012   Procedure: ABDOMINAL AORTAGRAM;  Surgeon: Rosetta Posner, MD;  Location: [facility] CATH LAB;  Service: Cardiovascular;  Laterality: N/A;  . AORTA - BILATERAL FEMORAL ARTERY BYPASS GRAFT N/A 08/09/2012   Procedure: AORTA BIFEMORAL BYPASS GRAFT;  Surgeon: Rosetta Posner, MD;  Location: [place];  Service: Vascular;  Laterality: N/A;  . CORONARY ARTERY BYPASS GRAFT  2005   x4  . HIP ARTHROPLASTY Right 04/09/2013   Procedure: ARTHROPLASTY BIPOLAR HIP;  Surgeon: Gearlean Alf, MD;  Location: [facility] ORS;  Service: Orthopedics;  Laterality: Right;  . JOINT REPLACEMENT Right Feb. 4, 2015   Hip- Pt fell  . PACEMAKER IMPLANT N/A 09/11/2016   Procedure: Pacemaker Implant;  Surgeon: Deboraha Sprang, MD;  Location: [place] CV LAB;  Service: Cardiovascular;  Laterality: N/A;  . SPINE SURGERY  1977   Lumbar HNP surgery    ROS- all systems are reviewed and negative except as per HPI above  Current Outpatient Prescriptions  Medication Sig Dispense Refill  . amLODipine (NORVASC) 5 MG tablet Take 5 mg by mouth daily.    Jearl Klinefelter ELLIPTA 62.5-25 MCG/INH AEPB Inhale 1 puff into the lungs every morning.     Marland Kitchen aspirin 81 MG tablet Take 81 mg by mouth every morning.     Marland Kitchen atorvastatin (LIPITOR) 40 MG tablet Take 40 mg by mouth every morning.     . cholecalciferol (VITAMIN D) 1000 UNITS tablet Take 1,000 Units by mouth every morning.     . escitalopram (LEXAPRO) 5 MG tablet Take 5 mg by mouth every evening.     . furosemide (LASIX) 20 MG tablet Take 0.5 tablets (10 mg total) by mouth daily as needed for fluid or edema. 30 tablet 3  . gabapentin (NEURONTIN) 300 MG capsule Take 300 mg by mouth every morning.     . hydrALAZINE (APRESOLINE) 50 MG tablet Take 1 tablet (50 mg total) by mouth 3 (three) times daily. 90 tablet 6  .  montelukast (SINGULAIR) 10 MG tablet Take 10 mg by mouth every evening.     Marland Kitchen oxybutynin (DITROPAN-XL) 5 MG 24 hr tablet Take 5 mg by mouth at bedtime.     Marland Kitchen PROAIR HFA 108 (90 BASE) MCG/ACT inhaler Inhale 2 puffs into the lungs every 4 (four) hours as needed for wheezing or shortness of breath.     . TRADJENTA 5 MG TABS tablet Take 5 mg by mouth every morning.    . traMADol (ULTRAM) 50 MG tablet Take 1 tablet by mouth every 6 (six) hours as needed for moderate pain.     . vitamin B-12 (CYANOCOBALAMIN) 100 MCG tablet Take 100 mcg by mouth every morning.     . zolpidem (AMBIEN) 10 MG tablet Take 5 mg by mouth at bedtime.      No current facility-administered medications for this visit.     Physical Exam: Vitals:   12/15/16 1118  BP: (!) 142/64  Pulse: 85  SpO2:  96%  Weight: 215 lb (97.5 kg)  Height: 5\' 10"  (1.778 m)    GEN- The patient is elderly, alert and oriented x 3 today.  In a wheelchair today Head- normocephalic, atraumatic Eyes-  Sclera clear, conjunctiva pink Ears- hearing intact Oropharynx- clear Lungs- Clear to ausculation bilaterally, normal work of breathing Chest- pacemaker pocket is well healed Heart- Regular rate and rhythm, no murmurs, rubs or gallops, PMI not laterally displaced GI- soft, NT, ND, + BS Extremities- no clubbing, cyanosis, + dependant edema  Pacemaker interrogation- reviewed in detail today,  See PACEART report  ekg tracing ordered today is personally reviewed and shows sinus rhythm with V pacing  Assessment and Plan:  1. Symptomatic complete heart block Normal pacemaker function See Pace Art report RV output reduced today for battery preservation  2. Obesity Body mass index is 30.85 kg/m. Lifestyle modification encouraged  3. HTN Stable No change required today  4. CAD No ischemic symptoms No changes  [facility] Does not have a smart phone and is therefore not a candidate for for BluSync trial at this time.  Follow-up with Dr  Meda Coffee as scheduled Return to see EP NP every year  Thompson Grayer MD, [facility] 12/15/2016 11:41 AM

## 2016-12-15 NOTE — Patient Instructions (Addendum)
Medication Instructions:  Your physician recommends that you continue on your current medications as directed. Please refer to the Current Medication list given to you today.  -- If you need a refill on your cardiac medications before your next appointment, please call your pharmacy. --  Labwork: None ordered  Testing/Procedures: None ordered  Follow-Up: Your physician wants you to follow-up in: 1 year with Chanetta Marshall, NP  You will receive a reminder letter in the mail two months in advance. If you don't receive a letter, please call our office to schedule the follow-up appointment.   Remote monitoring is used to monitor your Pacemaker  from home. This monitoring reduces the number of office visits required to check your device to one time per year. It allows [place] to keep an eye on the functioning of your device to ensure it is working properly. You are scheduled for a device check from home on 03/19/2016. You may send your transmission at any time that day. If you have a wireless device, the transmission will be sent automatically. After your physician reviews your transmission, you will receive a postcard with your next transmission date.    Thank you for choosing [facility]!!   Frederik Schmidt, RN [phone]  Any Other Special Instructions Will Be Listed Below (If Applicable).

## 2016-12-19 DIAGNOSIS — E1151 Type 2 diabetes mellitus with diabetic peripheral angiopathy without gangrene: Secondary | ICD-10-CM | POA: Diagnosis not present

## 2016-12-19 DIAGNOSIS — E1122 Type 2 diabetes mellitus with diabetic chronic kidney disease: Secondary | ICD-10-CM | POA: Diagnosis not present

## 2016-12-19 DIAGNOSIS — I5033 Acute on chronic diastolic (congestive) heart failure: Secondary | ICD-10-CM | POA: Diagnosis not present

## 2016-12-19 DIAGNOSIS — I130 Hypertensive heart and chronic kidney disease with heart failure and stage 1 through stage 4 chronic kidney disease, or unspecified chronic kidney disease: Secondary | ICD-10-CM | POA: Diagnosis not present

## 2016-12-19 DIAGNOSIS — Z48812 Encounter for surgical aftercare following surgery on the circulatory system: Secondary | ICD-10-CM | POA: Diagnosis not present

## 2016-12-19 DIAGNOSIS — I441 Atrioventricular block, second degree: Secondary | ICD-10-CM | POA: Diagnosis not present

## 2016-12-25 DIAGNOSIS — I130 Hypertensive heart and chronic kidney disease with heart failure and stage 1 through stage 4 chronic kidney disease, or unspecified chronic kidney disease: Secondary | ICD-10-CM | POA: Diagnosis not present

## 2016-12-25 DIAGNOSIS — I441 Atrioventricular block, second degree: Secondary | ICD-10-CM | POA: Diagnosis not present

## 2016-12-25 DIAGNOSIS — Z48812 Encounter for surgical aftercare following surgery on the circulatory system: Secondary | ICD-10-CM | POA: Diagnosis not present

## 2016-12-25 DIAGNOSIS — E1122 Type 2 diabetes mellitus with diabetic chronic kidney disease: Secondary | ICD-10-CM | POA: Diagnosis not present

## 2016-12-25 DIAGNOSIS — R0602 Shortness of breath: Secondary | ICD-10-CM | POA: Diagnosis not present

## 2016-12-25 DIAGNOSIS — E1151 Type 2 diabetes mellitus with diabetic peripheral angiopathy without gangrene: Secondary | ICD-10-CM | POA: Diagnosis not present

## 2016-12-25 DIAGNOSIS — I5033 Acute on chronic diastolic (congestive) heart failure: Secondary | ICD-10-CM | POA: Diagnosis not present

## 2016-12-25 DIAGNOSIS — R05 Cough: Secondary | ICD-10-CM | POA: Diagnosis not present

## 2017-01-03 DIAGNOSIS — J40 Bronchitis, not specified as acute or chronic: Secondary | ICD-10-CM | POA: Diagnosis not present

## 2017-01-03 DIAGNOSIS — R0600 Dyspnea, unspecified: Secondary | ICD-10-CM | POA: Diagnosis not present

## 2017-01-04 DIAGNOSIS — E1122 Type 2 diabetes mellitus with diabetic chronic kidney disease: Secondary | ICD-10-CM | POA: Diagnosis not present

## 2017-01-04 DIAGNOSIS — E1151 Type 2 diabetes mellitus with diabetic peripheral angiopathy without gangrene: Secondary | ICD-10-CM | POA: Diagnosis not present

## 2017-01-04 DIAGNOSIS — I5033 Acute on chronic diastolic (congestive) heart failure: Secondary | ICD-10-CM | POA: Diagnosis not present

## 2017-01-04 DIAGNOSIS — I130 Hypertensive heart and chronic kidney disease with heart failure and stage 1 through stage 4 chronic kidney disease, or unspecified chronic kidney disease: Secondary | ICD-10-CM | POA: Diagnosis not present

## 2017-01-04 DIAGNOSIS — I441 Atrioventricular block, second degree: Secondary | ICD-10-CM | POA: Diagnosis not present

## 2017-01-04 DIAGNOSIS — Z48812 Encounter for surgical aftercare following surgery on the circulatory system: Secondary | ICD-10-CM | POA: Diagnosis not present

## 2017-01-08 ENCOUNTER — Telehealth: Payer: Self-pay | Admitting: Cardiology

## 2017-01-08 NOTE — Telephone Encounter (Signed)
Patient wife calling, states that patient has been wheezing.Mrs. keimig would like to know if it would be okay for patient to use nebulizer.

## 2017-01-08 NOTE — Telephone Encounter (Signed)
Pt is calling to ask Dr Meda Coffee if its safe for him to use his Grandson's Nebulizer inhaler for ongoing wheezing.  Advised the pt that if its not prescribed for him, (and there is no neb prescribed for pt), then he should not use this.  Informed the pt that he has an albuterol inhaler he can use PRN, but no neb on file.  Advised the pt that if after utilizing albuterol inhaler, if this does not work, then he needs to follow with his PCP.  Informed the pt that his PCP may need to order or assess for infection, like pneumonia, bronchitis, etc.  Per the pt, he states he saw his PCP about 2 weeks ago and was placed on antibiotics for URI.  Pt states "its not that, for I completed my antibiotics." Advised the pt that he should call his PCP now and schedule a follow-up appt for ongoing complaints of  cough, wheezing, and recent diagnosis of URI.  Informed the pt that the ABT he was prescribed 2 weeks ago, may not have completely rid of his infection, and that would need his PCP's attention.  Pt has no cardiac complaints at this time.  Pt states he's afebrile.  Pt sounds and states he's in no obvious distress at this time.  Pt states he will call his PCP now and get a follow-up appt for recent URI, and he will use his PRN Inhaler as prescribed.  Informed the pt that I will route this message to Dr Meda Coffee as an Jul[place] Rainier. Pt verbalized understanding and agrees with plan mentioned.

## 2017-01-10 DIAGNOSIS — E1151 Type 2 diabetes mellitus with diabetic peripheral angiopathy without gangrene: Secondary | ICD-10-CM | POA: Diagnosis not present

## 2017-01-10 DIAGNOSIS — I5033 Acute on chronic diastolic (congestive) heart failure: Secondary | ICD-10-CM | POA: Diagnosis not present

## 2017-01-10 DIAGNOSIS — I130 Hypertensive heart and chronic kidney disease with heart failure and stage 1 through stage 4 chronic kidney disease, or unspecified chronic kidney disease: Secondary | ICD-10-CM | POA: Diagnosis not present

## 2017-01-10 DIAGNOSIS — E1122 Type 2 diabetes mellitus with diabetic chronic kidney disease: Secondary | ICD-10-CM | POA: Diagnosis not present

## 2017-01-10 DIAGNOSIS — Z48812 Encounter for surgical aftercare following surgery on the circulatory system: Secondary | ICD-10-CM | POA: Diagnosis not present

## 2017-01-10 DIAGNOSIS — I441 Atrioventricular block, second degree: Secondary | ICD-10-CM | POA: Diagnosis not present

## 2017-01-11 DIAGNOSIS — D631 Anemia in chronic kidney disease: Secondary | ICD-10-CM | POA: Diagnosis not present

## 2017-01-11 DIAGNOSIS — N184 Chronic kidney disease, stage 4 (severe): Secondary | ICD-10-CM | POA: Diagnosis not present

## 2017-01-14 DIAGNOSIS — E1122 Type 2 diabetes mellitus with diabetic chronic kidney disease: Secondary | ICD-10-CM | POA: Diagnosis not present

## 2017-01-14 DIAGNOSIS — Z48812 Encounter for surgical aftercare following surgery on the circulatory system: Secondary | ICD-10-CM | POA: Diagnosis not present

## 2017-01-14 DIAGNOSIS — I441 Atrioventricular block, second degree: Secondary | ICD-10-CM | POA: Diagnosis not present

## 2017-01-14 DIAGNOSIS — I5033 Acute on chronic diastolic (congestive) heart failure: Secondary | ICD-10-CM | POA: Diagnosis not present

## 2017-01-14 DIAGNOSIS — I130 Hypertensive heart and chronic kidney disease with heart failure and stage 1 through stage 4 chronic kidney disease, or unspecified chronic kidney disease: Secondary | ICD-10-CM | POA: Diagnosis not present

## 2017-01-14 DIAGNOSIS — E1151 Type 2 diabetes mellitus with diabetic peripheral angiopathy without gangrene: Secondary | ICD-10-CM | POA: Diagnosis not present

## 2017-01-18 DIAGNOSIS — I130 Hypertensive heart and chronic kidney disease with heart failure and stage 1 through stage 4 chronic kidney disease, or unspecified chronic kidney disease: Secondary | ICD-10-CM | POA: Diagnosis not present

## 2017-01-18 DIAGNOSIS — E1122 Type 2 diabetes mellitus with diabetic chronic kidney disease: Secondary | ICD-10-CM | POA: Diagnosis not present

## 2017-01-18 DIAGNOSIS — I441 Atrioventricular block, second degree: Secondary | ICD-10-CM | POA: Diagnosis not present

## 2017-01-18 DIAGNOSIS — E1151 Type 2 diabetes mellitus with diabetic peripheral angiopathy without gangrene: Secondary | ICD-10-CM | POA: Diagnosis not present

## 2017-01-18 DIAGNOSIS — I5033 Acute on chronic diastolic (congestive) heart failure: Secondary | ICD-10-CM | POA: Diagnosis not present

## 2017-01-18 DIAGNOSIS — Z48812 Encounter for surgical aftercare following surgery on the circulatory system: Secondary | ICD-10-CM | POA: Diagnosis not present

## 2017-01-22 ENCOUNTER — Encounter (HOSPITAL_COMMUNITY): Payer: Self-pay | Admitting: General Practice

## 2017-01-22 ENCOUNTER — Other Ambulatory Visit: Payer: Self-pay | Admitting: Physician Assistant

## 2017-01-22 ENCOUNTER — Inpatient Hospital Stay (HOSPITAL_COMMUNITY)
Admission: AD | Admit: 2017-01-22 | Discharge: 2017-01-24 | DRG: 291 | Disposition: A | Discharge status: Home Health | Payer: Medicare Other | Source: Ambulatory Visit | Attending: Cardiology | Admitting: Cardiology

## 2017-01-22 ENCOUNTER — Other Ambulatory Visit: Payer: Self-pay

## 2017-01-22 ENCOUNTER — Inpatient Hospital Stay (HOSPITAL_COMMUNITY): Payer: Medicare Other

## 2017-01-22 ENCOUNTER — Encounter: Payer: Self-pay | Admitting: Cardiology

## 2017-01-22 ENCOUNTER — Ambulatory Visit (INDEPENDENT_AMBULATORY_CARE_PROVIDER_SITE_OTHER): Payer: Medicare Other | Admitting: Cardiology

## 2017-01-22 VITALS — BP 142/80 | HR 83 | Ht 70.0 in | Wt 216.0 lb

## 2017-01-22 DIAGNOSIS — E119 Type 2 diabetes mellitus without complications: Secondary | ICD-10-CM

## 2017-01-22 DIAGNOSIS — I739 Peripheral vascular disease, unspecified: Secondary | ICD-10-CM | POA: Diagnosis present

## 2017-01-22 DIAGNOSIS — I5033 Acute on chronic diastolic (congestive) heart failure: Secondary | ICD-10-CM

## 2017-01-22 DIAGNOSIS — N184 Chronic kidney disease, stage 4 (severe): Secondary | ICD-10-CM | POA: Diagnosis present

## 2017-01-22 DIAGNOSIS — I340 Nonrheumatic mitral (valve) insufficiency: Secondary | ICD-10-CM | POA: Diagnosis not present

## 2017-01-22 DIAGNOSIS — R0609 Other forms of dyspnea: Secondary | ICD-10-CM | POA: Diagnosis not present

## 2017-01-22 DIAGNOSIS — Z7982 Long term (current) use of aspirin: Secondary | ICD-10-CM

## 2017-01-22 DIAGNOSIS — E1142 Type 2 diabetes mellitus with diabetic polyneuropathy: Secondary | ICD-10-CM | POA: Diagnosis present

## 2017-01-22 DIAGNOSIS — J439 Emphysema, unspecified: Secondary | ICD-10-CM | POA: Diagnosis present

## 2017-01-22 DIAGNOSIS — E669 Obesity, unspecified: Secondary | ICD-10-CM | POA: Diagnosis present

## 2017-01-22 DIAGNOSIS — I248 Other forms of acute ischemic heart disease: Secondary | ICD-10-CM | POA: Diagnosis present

## 2017-01-22 DIAGNOSIS — R42 Dizziness and giddiness: Secondary | ICD-10-CM

## 2017-01-22 DIAGNOSIS — I2510 Atherosclerotic heart disease of native coronary artery without angina pectoris: Secondary | ICD-10-CM | POA: Diagnosis present

## 2017-01-22 DIAGNOSIS — N183 Chronic kidney disease, stage 3 unspecified: Secondary | ICD-10-CM | POA: Diagnosis present

## 2017-01-22 DIAGNOSIS — I69941 Monoplegia of lower limb following unspecified cerebrovascular disease affecting right dominant side: Secondary | ICD-10-CM | POA: Diagnosis not present

## 2017-01-22 DIAGNOSIS — Z951 Presence of aortocoronary bypass graft: Secondary | ICD-10-CM

## 2017-01-22 DIAGNOSIS — R0602 Shortness of breath: Secondary | ICD-10-CM

## 2017-01-22 DIAGNOSIS — E1151 Type 2 diabetes mellitus with diabetic peripheral angiopathy without gangrene: Secondary | ICD-10-CM | POA: Diagnosis present

## 2017-01-22 DIAGNOSIS — Z95828 Presence of other vascular implants and grafts: Secondary | ICD-10-CM | POA: Diagnosis not present

## 2017-01-22 DIAGNOSIS — Z6829 Body mass index (BMI) 29.0-29.9, adult: Secondary | ICD-10-CM | POA: Diagnosis not present

## 2017-01-22 DIAGNOSIS — Z8679 Personal history of other diseases of the circulatory system: Secondary | ICD-10-CM

## 2017-01-22 DIAGNOSIS — Z7984 Long term (current) use of oral hypoglycemic drugs: Secondary | ICD-10-CM

## 2017-01-22 DIAGNOSIS — E1122 Type 2 diabetes mellitus with diabetic chronic kidney disease: Secondary | ICD-10-CM | POA: Diagnosis present

## 2017-01-22 DIAGNOSIS — I130 Hypertensive heart and chronic kidney disease with heart failure and stage 1 through stage 4 chronic kidney disease, or unspecified chronic kidney disease: Principal | ICD-10-CM | POA: Diagnosis present

## 2017-01-22 DIAGNOSIS — I441 Atrioventricular block, second degree: Secondary | ICD-10-CM | POA: Diagnosis present

## 2017-01-22 DIAGNOSIS — I5043 Acute on chronic combined systolic (congestive) and diastolic (congestive) heart failure: Secondary | ICD-10-CM | POA: Diagnosis present

## 2017-01-22 DIAGNOSIS — E876 Hypokalemia: Secondary | ICD-10-CM | POA: Diagnosis present

## 2017-01-22 DIAGNOSIS — I493 Ventricular premature depolarization: Secondary | ICD-10-CM | POA: Diagnosis present

## 2017-01-22 DIAGNOSIS — Z79899 Other long term (current) drug therapy: Secondary | ICD-10-CM | POA: Diagnosis not present

## 2017-01-22 DIAGNOSIS — E785 Hyperlipidemia, unspecified: Secondary | ICD-10-CM | POA: Diagnosis present

## 2017-01-22 DIAGNOSIS — Z87891 Personal history of nicotine dependence: Secondary | ICD-10-CM

## 2017-01-22 DIAGNOSIS — I701 Atherosclerosis of renal artery: Secondary | ICD-10-CM | POA: Diagnosis present

## 2017-01-22 DIAGNOSIS — Z96641 Presence of right artificial hip joint: Secondary | ICD-10-CM | POA: Diagnosis present

## 2017-01-22 DIAGNOSIS — J449 Chronic obstructive pulmonary disease, unspecified: Secondary | ICD-10-CM

## 2017-01-22 DIAGNOSIS — I25709 Atherosclerosis of coronary artery bypass graft(s), unspecified, with unspecified angina pectoris: Secondary | ICD-10-CM

## 2017-01-22 DIAGNOSIS — Z950 Presence of cardiac pacemaker: Secondary | ICD-10-CM

## 2017-01-22 DIAGNOSIS — Z8249 Family history of ischemic heart disease and other diseases of the circulatory system: Secondary | ICD-10-CM

## 2017-01-22 DIAGNOSIS — J441 Chronic obstructive pulmonary disease with (acute) exacerbation: Secondary | ICD-10-CM | POA: Diagnosis present

## 2017-01-22 DIAGNOSIS — I10 Essential (primary) hypertension: Secondary | ICD-10-CM

## 2017-01-22 DIAGNOSIS — Z720 Tobacco use: Secondary | ICD-10-CM | POA: Diagnosis present

## 2017-01-22 HISTORY — DX: Presence of cardiac pacemaker: Z95.0

## 2017-01-22 HISTORY — DX: Chronic kidney disease, stage 3 (moderate): N18.3

## 2017-01-22 HISTORY — DX: Type 2 diabetes mellitus without complications: E11.9

## 2017-01-22 HISTORY — DX: Chronic kidney disease, stage 3 unspecified: N18.30

## 2017-01-22 LAB — CBC WITH DIFFERENTIAL/PLATELET
Basophils Absolute: 0 10*3/uL (ref 0.0–0.1)
Basophils Relative: 0 %
Eosinophils Absolute: 0.2 10*3/uL (ref 0.0–0.7)
Eosinophils Relative: 2 %
HEMATOCRIT: 34.2 % — AB (ref 39.0–52.0)
Hemoglobin: 10.4 g/dL — ABNORMAL LOW (ref 13.0–17.0)
LYMPHS ABS: 1.8 10*3/uL (ref 0.7–4.0)
LYMPHS PCT: 18 %
MCH: 29.1 pg (ref 26.0–34.0)
MCHC: 30.4 g/dL (ref 30.0–36.0)
MCV: 95.8 fL (ref 78.0–100.0)
MONO ABS: 0.7 10*3/uL (ref 0.1–1.0)
MONOS PCT: 7 %
NEUTROS ABS: 7.3 10*3/uL (ref 1.7–7.7)
Neutrophils Relative %: 73 %
Platelets: 185 10*3/uL (ref 150–400)
RBC: 3.57 MIL/uL — ABNORMAL LOW (ref 4.22–5.81)
RDW: 14.9 % (ref 11.5–15.5)
WBC: 10.1 10*3/uL (ref 4.0–10.5)

## 2017-01-22 LAB — COMPREHENSIVE METABOLIC PANEL
ALBUMIN: 3.5 g/dL (ref 3.5–5.0)
ALK PHOS: 91 U/L (ref 38–126)
ALT: 15 U/L — ABNORMAL LOW (ref 17–63)
ANION GAP: 10 (ref 5–15)
AST: 26 U/L (ref 15–41)
BUN: 34 mg/dL — ABNORMAL HIGH (ref 6–20)
CALCIUM: 8.9 mg/dL (ref 8.9–10.3)
CO2: 23 mmol/L (ref 22–32)
Chloride: 105 mmol/L (ref 101–111)
Creatinine, Ser: 2.73 mg/dL — ABNORMAL HIGH (ref 0.61–1.24)
GFR calc Af Amer: 23 mL/min — ABNORMAL LOW (ref >60)
GFR calc non Af Amer: 20 mL/min — ABNORMAL LOW (ref >60)
GLUCOSE: 120 mg/dL — AB (ref 65–99)
POTASSIUM: 3.6 mmol/L (ref 3.5–5.1)
SODIUM: 138 mmol/L (ref 135–145)
Total Bilirubin: 0.6 mg/dL (ref 0.3–1.2)
Total Protein: 7.2 g/dL (ref 6.5–8.1)

## 2017-01-22 LAB — GLUCOSE, CAPILLARY: Glucose-Capillary: 121 mg/dL — ABNORMAL HIGH (ref 65–99)

## 2017-01-22 LAB — TSH: TSH: 2.997 u[IU]/mL (ref 0.350–4.500)

## 2017-01-22 LAB — TROPONIN I: Troponin I: 0.07 ng/mL (ref <0.03)

## 2017-01-22 LAB — BRAIN NATRIURETIC PEPTIDE: B Natriuretic Peptide: 811.1 pg/mL — ABNORMAL HIGH (ref 0.0–100.0)

## 2017-01-22 MED ORDER — AMLODIPINE BESYLATE 5 MG PO TABS
5.0000 mg | ORAL_TABLET | Freq: Every day | ORAL | Status: DC
  Administered 2017-01-23 – 2017-01-24 (×2): 5 mg via ORAL
  Filled 2017-01-22 (×2): qty 1

## 2017-01-22 MED ORDER — LINAGLIPTIN 5 MG PO TABS
5.0000 mg | ORAL_TABLET | Freq: Every day | ORAL | Status: DC
  Administered 2017-01-23 – 2017-01-24 (×2): 5 mg via ORAL
  Filled 2017-01-22 (×2): qty 1

## 2017-01-22 MED ORDER — ALBUTEROL SULFATE (2.5 MG/3ML) 0.083% IN NEBU
INHALATION_SOLUTION | RESPIRATORY_TRACT | Status: AC
  Filled 2017-01-22: qty 3

## 2017-01-22 MED ORDER — ZOLPIDEM TARTRATE 5 MG PO TABS
5.0000 mg | ORAL_TABLET | Freq: Every day | ORAL | Status: DC
  Administered 2017-01-22 – 2017-01-23 (×2): 5 mg via ORAL
  Filled 2017-01-22 (×2): qty 1

## 2017-01-22 MED ORDER — ESCITALOPRAM OXALATE 10 MG PO TABS
5.0000 mg | ORAL_TABLET | Freq: Every evening | ORAL | Status: DC
  Administered 2017-01-22 – 2017-01-23 (×2): 5 mg via ORAL
  Filled 2017-01-22 (×2): qty 1

## 2017-01-22 MED ORDER — VITAMIN D 1000 UNITS PO TABS
1000.0000 [IU] | ORAL_TABLET | Freq: Every day | ORAL | Status: DC
  Administered 2017-01-23 – 2017-01-24 (×2): 1000 [IU] via ORAL
  Filled 2017-01-22 (×2): qty 1

## 2017-01-22 MED ORDER — HEPARIN SODIUM (PORCINE) 5000 UNIT/ML IJ SOLN
5000.0000 [IU] | Freq: Three times a day (TID) | INTRAMUSCULAR | Status: DC
  Administered 2017-01-22 – 2017-01-24 (×5): 5000 [IU] via SUBCUTANEOUS
  Filled 2017-01-22 (×5): qty 1

## 2017-01-22 MED ORDER — ASPIRIN 81 MG PO CHEW
81.0000 mg | CHEWABLE_TABLET | ORAL | Status: DC
  Administered 2017-01-23 – 2017-01-24 (×2): 81 mg via ORAL
  Filled 2017-01-22 (×3): qty 1

## 2017-01-22 MED ORDER — FUROSEMIDE 10 MG/ML IJ SOLN
40.0000 mg | Freq: Two times a day (BID) | INTRAMUSCULAR | Status: DC
  Administered 2017-01-22 – 2017-01-23 (×3): 40 mg via INTRAVENOUS
  Filled 2017-01-22 (×4): qty 4

## 2017-01-22 MED ORDER — SODIUM CHLORIDE 0.9% FLUSH: 3.0000 mL | INTRAVENOUS | Status: DC | PRN

## 2017-01-22 MED ORDER — TRAMADOL HCL 50 MG PO TABS: 50.0000 mg | ORAL_TABLET | Freq: Four times a day (QID) | ORAL | Status: DC | PRN

## 2017-01-22 MED ORDER — OXYBUTYNIN CHLORIDE ER 5 MG PO TB24
5.0000 mg | ORAL_TABLET | Freq: Every day | ORAL | Status: DC
  Administered 2017-01-22 – 2017-01-23 (×2): 5 mg via ORAL
  Filled 2017-01-22 (×2): qty 1

## 2017-01-22 MED ORDER — UMECLIDINIUM-VILANTEROL 62.5-25 MCG/INH IN AEPB
1.0000 | INHALATION_SPRAY | Freq: Every day | RESPIRATORY_TRACT | Status: DC
  Administered 2017-01-23: 08:00:00 1 via RESPIRATORY_TRACT
  Filled 2017-01-22: qty 14

## 2017-01-22 MED ORDER — ONDANSETRON HCL 4 MG/2ML IJ SOLN: 4.0000 mg | Freq: Four times a day (QID) | INTRAMUSCULAR | Status: DC | PRN

## 2017-01-22 MED ORDER — VITAMIN B-12 100 MCG PO TABS
100.0000 ug | ORAL_TABLET | Freq: Every day | ORAL | Status: DC
  Administered 2017-01-23 – 2017-01-24 (×2): 100 ug via ORAL
  Filled 2017-01-22 (×2): qty 1

## 2017-01-22 MED ORDER — SODIUM CHLORIDE 0.9 % IV SOLN: 250.0000 mL | INTRAVENOUS | Status: DC | PRN

## 2017-01-22 MED ORDER — MONTELUKAST SODIUM 10 MG PO TABS
10.0000 mg | ORAL_TABLET | Freq: Every day | ORAL | Status: DC
  Administered 2017-01-22 – 2017-01-23 (×2): 10 mg via ORAL
  Filled 2017-01-22 (×2): qty 1

## 2017-01-22 MED ORDER — SODIUM CHLORIDE 0.9% FLUSH
3.0000 mL | Freq: Two times a day (BID) | INTRAVENOUS | Status: DC
  Administered 2017-01-22 – 2017-01-24 (×4): 3 mL via INTRAVENOUS

## 2017-01-22 MED ORDER — ALBUTEROL SULFATE (2.5 MG/3ML) 0.083% IN NEBU
2.5000 mg | INHALATION_SOLUTION | Freq: Four times a day (QID) | RESPIRATORY_TRACT | Status: DC
  Administered 2017-01-23 (×3): 2.5 mg via RESPIRATORY_TRACT
  Filled 2017-01-22 (×4): qty 3

## 2017-01-22 MED ORDER — ACETAMINOPHEN 325 MG PO TABS: 650.0000 mg | ORAL_TABLET | ORAL | Status: DC | PRN

## 2017-01-22 MED ORDER — ATORVASTATIN CALCIUM 40 MG PO TABS
40.0000 mg | ORAL_TABLET | Freq: Every day | ORAL | Status: DC
Start: 2017-01-23 — End: 2017-01-24
  Administered 2017-01-23 – 2017-01-24 (×2): 40 mg via ORAL
  Filled 2017-01-22 (×2): qty 1

## 2017-01-22 MED ORDER — HYDRALAZINE HCL 50 MG PO TABS
50.0000 mg | ORAL_TABLET | Freq: Three times a day (TID) | ORAL | Status: DC
  Administered 2017-01-22 – 2017-01-24 (×5): 50 mg via ORAL
  Filled 2017-01-22 (×5): qty 1

## 2017-01-22 MED ORDER — GABAPENTIN 300 MG PO CAPS
300.0000 mg | ORAL_CAPSULE | Freq: Every day | ORAL | Status: DC
Start: 2017-01-23 — End: 2017-01-24
  Administered 2017-01-23 – 2017-01-24 (×2): 300 mg via ORAL
  Filled 2017-01-22 (×2): qty 1

## 2017-01-22 NOTE — Progress Notes (Signed)
Patient refused tx at this time, no distress noted patients family at bedside.

## 2017-01-22 NOTE — Patient Instructions (Signed)
Medication Instructions:   Your physician recommends that you continue on your current medications as directed. Please refer to the Current Medication list given to you today.     Follow-Up:  DR Meda Coffee HAS MADE ARRANGEMENTS FOR BED CONTROL AT [facility] ADMIT YOU TODAY---[place] BED CONTROL WILL CALL YOU AT [facility] AND/OR CELL NUMBER WHEN YOUR CARDIAC TELEMETRY BED IS AVAILABLE.         If you need a refill on your cardiac medications before your next appointment, please call your pharmacy.

## 2017-01-22 NOTE — Progress Notes (Signed)
Cardiology Office Note:    Date:  01/22/2017   ID:  [name] [name], DOB [birth year], MRN [id number]  PCP:  Jani Gravel, MD  Cardiologist:  Ena Dawley, MD  Electrophysiologist: Dr Rayann Heman Referring MD: Jani Gravel, MD   Chief complain: SOB, fatigue  History of Present Illness:    [name] [name] is a 83 y.o. male with a hx of CAD s/p CABG in 2005 x 4 with low risk myoview in 2016. H/o aorta-bifem bypass as well. He was admitted to [facility] on 09/08/2016 for acute on chronic diastolic heart failure as well as symptomatic 3. AVB. Per records, the patient has a long history of bradycardia however had refused PPM implantation in the past. He presented to the hospital with near syncope. Given his worsening symptoms and degree of bradycardia, he required implantation of a permanent pacemaker which was implanted by Dr. Caryl Comes on 09/12/16 (no etiologies identified, he ruled-out for ACS). The device is Medtronic. His acute heart failure was treated with diuretics. Echocardiogram revealed normal left ventricular systolic function with an EF of 55-60% and grade 1 diastolic dysfunction.  He was seen by Dr. Rayann Heman on 12/15/2016 for pacemaker placement follow-up and device is working properly.  01/22/2017 - today he states that he initially he felt better with some improved energy, however in the last 3 weeks he has noticed worsening dyspnea on minimal exertion and now addressed. He feels like he can't breathe at all, sleeps on a vegan in his bed. He has also noticed mild lower extremity edema. He has been wheezing a lot and using his rescue inhaler much more frequently than usual. He visited his primary care physician who prescribed Levaquin with no improvement. He denies any fever or chills, he has cough with white clear sputum, however he feels like he is very congested and cannot cough it out. He denies any palpitations or syncope. He feels like he has no energy.   Past Medical History:  Diagnosis Date  . AAA  (abdominal aortic aneurysm) [facility])    Surgical repair June, 2014  . Acute on chronic combined systolic and diastolic CHF (congestive heart failure) ([place]) 01/22/2017  . CAD (coronary artery disease)   . Carotid artery occlusion   . Chronic renal insufficiency   . COPD (chronic obstructive pulmonary disease) ([place]) 08/15/2012  . CVA (cerebral vascular accident) ([place])    residual R leg weakness  . Diabetes mellitus without complication ([place])   . Dyslipidemia   . Fall at home Feb.4, 2015   Fx Right Hip  . HTN (hypertension)   . Hx of CABG    2005  . Morbid obesity ([place])   . Neuropathy    Right foot  . Peripheral vascular disease ([place])   . Renal artery stenosis ([place])   . Status post abdominal aortic aneurysm repair    June, 2014  . Tobacco abuse    quit 2014    Past Surgical History:  Procedure Laterality Date  . ABDOMINAL AORTAGRAM N/A 08/07/2012   Performed by Rosetta Posner, MD at [facility] CATH LAB  . ANGIOGRAM EXTREMITY BILATERAL Bilateral 08/07/2012   Performed by Rosetta Posner, MD at [facility] CATH LAB  . AORTA BIFEMORAL BYPASS GRAFT N/A 08/09/2012   Performed by Rosetta Posner, MD at [facility] OR  . ARTHROPLASTY BIPOLAR HIP Right 04/09/2013   Performed by Gearlean Alf, MD at [facility] ORS  . CORONARY ARTERY BYPASS GRAFT  2005   x4  . JOINT REPLACEMENT Right Feb.  4, 2015   Hip- Pt fell  . Pacemaker Implant N/A 09/11/2016   Performed by Deboraha Sprang, MD at [place] CV LAB  . SPINE SURGERY  1977   Lumbar HNP surgery    Current Medications: Current Meds  Medication Sig  . amLODipine (NORVASC) 5 MG tablet Take 5 mg by mouth daily.  Jearl Klinefelter ELLIPTA 62.5-25 MCG/INH AEPB Inhale 1 puff into the lungs every morning.   Marland Kitchen aspirin 81 MG tablet Take 81 mg by mouth every morning.   Marland Kitchen atorvastatin (LIPITOR) 40 MG tablet Take 40 mg by mouth every morning.   . cholecalciferol (VITAMIN D) 1000 UNITS tablet Take 1,000 Units by mouth every morning.   . escitalopram (LEXAPRO) 5 MG tablet Take 5 mg by mouth every  evening.   . furosemide (LASIX) 20 MG tablet Take 0.5 tablets (10 mg total) by mouth daily as needed for fluid or edema.  . gabapentin (NEURONTIN) 300 MG capsule Take 300 mg by mouth every morning.   Marland Kitchen glucose monitoring kit (FREESTYLE) monitoring kit 1 each as needed by Does not apply route for other.  . hydrALAZINE (APRESOLINE) 50 MG tablet Take 1 tablet (50 mg total) by mouth 3 (three) times daily.  Marland Kitchen levofloxacin (LEVAQUIN) 500 MG tablet Take 500 mg 7 days by mouth.  Marland Kitchen lisinopril (PRINIVIL,ZESTRIL) 5 MG tablet Take 5 mg daily by mouth.  . montelukast (SINGULAIR) 10 MG tablet Take 10 mg by mouth every evening.   Marland Kitchen oxybutynin (DITROPAN-XL) 5 MG 24 hr tablet Take 5 mg by mouth at bedtime.   Marland Kitchen PROAIR HFA 108 (90 BASE) MCG/ACT inhaler Inhale 2 puffs into the lungs every 4 (four) hours as needed for wheezing or shortness of breath.   . TRADJENTA 5 MG TABS tablet Take 5 mg by mouth every morning.  . traMADol (ULTRAM) 50 MG tablet Take 1 tablet by mouth every 6 (six) hours as needed for moderate pain.   . vitamin B-12 (CYANOCOBALAMIN) 100 MCG tablet Take 100 mcg by mouth every morning.   . zolpidem (AMBIEN) 10 MG tablet Take 5 mg by mouth at bedtime.      Allergies:   Patient has no known allergies.   Social History   Socioeconomic History  . Marital status: Married    Spouse name: None  . Number of children: None  . Years of education: None  . Highest education level: None  Social Needs  . Financial resource strain: None  . Food insecurity - worry: None  . Food insecurity - inability: None  . Transportation needs - medical: None  . Transportation needs - non-medical: None  Occupational History  . Occupation: retired  Tobacco Use  . Smoking status: Former Smoker    Packs/day: 0.50    Years: 50.00    Pack years: 25.00    Types: Cigarettes    Last attempt to quit: 08/05/2012    Years since quitting: 4.4  . Smokeless tobacco: Never Used  Substance and Sexual Activity  . Alcohol  use: Yes    Comment: occasionally  . Drug use: No  . Sexual activity: None  Other Topics Concern  . None  Social History Narrative   Pt lives in [place] with spouse.  Retired from [employer].     Family History: The patient's family history includes Coronary artery disease in his father; Tuberculosis (age of onset: 25) in his mother; Varicose Veins in his father. ROS:   Please see the history of present illness.  All other systems reviewed and are negative.  EKGs/Labs/Other Studies Reviewed:    The following studies were reviewed today:  EKG:  EKG is not ordered today.    Recent Labs: 09/08/2016: ALT 17; B Natriuretic Peptide 724.6; Magnesium 2.0; TSH 3.351 09/10/2016: Hemoglobin 11.6; Platelets 191 09/12/2016: BUN 44; Creatinine, Ser 2.95; Potassium 3.6; Sodium 137  Recent Lipid Panel No results found for: CHOL, TRIG, HDL, CHOLHDL, VLDL, LDLCALC, LDLDIRECT  Physical Exam:    VS:  BP (!) 142/80   Pulse 83   Ht 5' 10"  (1.778 m)   Wt 216 lb (98 kg)   SpO2 93%   BMI 30.99 kg/m     Wt Readings from Last 3 Encounters:  01/22/17 216 lb (98 kg)  12/15/16 215 lb (97.5 kg)  11/03/16 215 lb (97.5 kg)     GEN: Well nourished, appears mildly SOB at rest, appears somnolent HEENT: Normal NECK: No JVD; No carotid bruits LYMPHATICS: No lymphadenopathy CARDIAC: RRR, no murmurs, rubs, gallops RESPIRATORY:  Crackles to auscultation B/L, wheezing or rhonchi  ABDOMEN: Soft, non-tender, non-distended MUSCULOSKELETAL: B/L feet edema; No deformity  SKIN: Warm and dry NEUROLOGIC:  Alert and oriented x 3 PSYCHIATRIC:  Normal affect   TTE: 09/09/2016 - Left ventricle: The cavity size was normal. Wall thickness was   increased in a pattern of mild LVH. Systolic function was normal.   The estimated ejection fraction was in the range of 55% to 60%.   Wall motion was normal; there were no regional wall motion   abnormalities. Doppler parameters are consistent with abnormal   left ventricular  relaxation (grade 1 diastolic dysfunction). - Mitral valve: Mildly calcified annulus. There was mild   regurgitation. - Left atrium: The atrium was mildly to moderately dilated.   ASSESSMENT:    1. Acute on chronic diastolic heart failure ([place])   2. Coronary artery disease involving native coronary artery of native heart without angina pectoris   3. S/P placement of cardiac pacemaker   4. Chronic obstructive pulmonary disease, unspecified COPD type ([place])   5. SOB (shortness of breath)    PLAN:    In order of problems listed above:  The patient is presenting with severe shortness of breath on exertion and at rest with evidence of fluid overload with crackles on his lungs and bilateral lower extremity edema. His O2 saturation at rest is 93% He also complains of absolutely no energy, and chest tightness even at rest. The differential includes acute on chronic combined systolic and diastolic heart failure, acute COPD his examination, he has history of 60 year pack of smoking and severe emphysema, pacemaker induced cardiomyopathy since he feels worse since his pacemaker was implanted.  Plan: - Admit to inpatient, anticipated length of stay 2 nights - Order an echocardiogram to reevaluate LV EF - Start Lasix 40 mg IV twice a day - Follow creatinine closely given CK D stage IV - Hold lisinopril - Evaluate for home O2 therapy - Chest x-ray - Daily labs including BMP and BNP CBC - Breathing treatments - Order an EKG  Medication Adjustments/Labs and Tests Ordered: Current medicines are reviewed at length with the patient today.  Concerns regarding medicines are outlined above.  No orders of the defined types were placed in this encounter.  No orders of the defined types were placed in this encounter.   Signed, Ena Dawley, MD  01/22/2017 4:08 PM    [facility]

## 2017-01-22 NOTE — Progress Notes (Addendum)
Dr. Meda Coffee contacted me to let me know she was admitting pt as soon as he has a bed; he is awaiting call at home. Per her recommendations, ordered echo ASAP (not stat but as soon as possible, AM OK), CXR, BNP, basic labs, troponins, q6hr albuterol, and Lasix 40mg  BID. He will need eval for home O2 prior to dc. I contacted patient/wife who informed me he took all meds today except Ambien, Lexapro, oxybutynin which he takes at night. She did not think she takes lisinopril anymore so we did not resume. Sign/held orders written. Dayna Dunn PA-C  8:01 PM  Signed out to fellow to be aware to f/u EKG, labs, CXR. Dayna Dunn PA-C

## 2017-01-23 ENCOUNTER — Other Ambulatory Visit: Payer: Self-pay

## 2017-01-23 ENCOUNTER — Inpatient Hospital Stay (HOSPITAL_COMMUNITY): Payer: Medicare Other

## 2017-01-23 DIAGNOSIS — I340 Nonrheumatic mitral (valve) insufficiency: Secondary | ICD-10-CM

## 2017-01-23 LAB — GLUCOSE, CAPILLARY
Glucose-Capillary: 104 mg/dL — ABNORMAL HIGH (ref 65–99)
Glucose-Capillary: 95 mg/dL (ref 65–99)
Glucose-Capillary: 116 mg/dL — ABNORMAL HIGH (ref 65–99)
Glucose-Capillary: 135 mg/dL — ABNORMAL HIGH (ref 65–99)

## 2017-01-23 LAB — ECHOCARDIOGRAM COMPLETE
Ao-asc: 38 cm
E decel time: 225 msec
FS: 15 % — AB (ref 28–44)
HEIGHTINCHES: 70 in
IVS/LV PW RATIO, ED: 1.07
LA ID, A-P, ES: 46 mm
LA diam end sys: 46 mm
LA vol A4C: 69.8 ml
LADIAMINDEX: 2.1 cm/m2
LAVOL: 79.5 mL
LAVOLIN: 36.3 mL/m2
LDCA: 3.14 cm2
LVOTD: 20 mm
MRPISAEROA: 0.11 cm2
MV Dec: 225
MV VTI: 172 cm
MV pk A vel: 61.6 m/s
MVPG: 5 mmHg
MVPKEVEL: 109 m/s
PW: 11.5 mm — AB (ref 0.6–1.1)
RV TAPSE: 17.3 mm
Reg peak vel: 270 cm/s
TR max vel: 270 cm/s
Weight: 3350.4 oz

## 2017-01-23 LAB — BASIC METABOLIC PANEL
Anion gap: 7 (ref 5–15)
BUN: 35 mg/dL — AB (ref 6–20)
CALCIUM: 8.6 mg/dL — AB (ref 8.9–10.3)
CO2: 25 mmol/L (ref 22–32)
CREATININE: 2.66 mg/dL — AB (ref 0.61–1.24)
Chloride: 107 mmol/L (ref 101–111)
GFR calc Af Amer: 24 mL/min — ABNORMAL LOW (ref >60)
GFR, EST NON AFRICAN AMERICAN: 21 mL/min — AB (ref >60)
Glucose, Bld: 109 mg/dL — ABNORMAL HIGH (ref 65–99)
Potassium: 3.2 mmol/L — ABNORMAL LOW (ref 3.5–5.1)
SODIUM: 139 mmol/L (ref 135–145)

## 2017-01-23 LAB — TROPONIN I
TROPONIN I: 0.1 ng/mL — AB (ref <0.03)
TROPONIN I: 0.08 ng/mL — AB (ref <0.03)

## 2017-01-23 MED ORDER — POTASSIUM CHLORIDE CRYS ER 20 MEQ PO TBCR
20.0000 meq | EXTENDED_RELEASE_TABLET | Freq: Once | ORAL | Status: AC
  Administered 2017-01-23: 20 meq via ORAL
  Filled 2017-01-23: qty 1

## 2017-01-23 NOTE — Progress Notes (Signed)
Patient refused, patient stated he did not wish to be woken up.

## 2017-01-23 NOTE — Care Management Note (Addendum)
Case Management Note  Patient Details  Name: [name] [name] MRN: [id number] Date of Birth: 1931/05/07  Subjective/Objective:   Acute on chronic HF,  Shortness of breath                 Action/Plan: Discharge Planning: NCM spoke to pt and offered choice for [facility]. States he is active with [facility] with [facility]. States he has completed his HHPT. Has RW x2 at home. Wife and/or grandson is in the home to assist as needed. Contacted [facility] to follow up. Will need resumption of care order with F2F. Pt may need home oxygen and neb machine.   PCP Jani Gravel MD  Expected Discharge Date:                  Expected Discharge Plan:  [place]  In-House Referral:  NA  Discharge planning Services  CM Consult  Post Acute Care Choice:  [place], Resumption of Svcs/PTA Provider Choice offered to:  Patient  DME Arranged:    DME Agency:     HH Arranged:  RN [place] Agency:  [facility] ([facility]  Status of Service:  In process, will continue to follow  If discussed at Long Length of Stay Meetings, dates discussed:    Additional Comments:  Erenest Rasher, RN 01/23/2017, 11:32 AM

## 2017-01-23 NOTE — H&P (Signed)
Cardiology History and Physical    Date:  01/22/2017   ID:  SAYF KERNER, DOB 1931/11/24, MRN [id number]  PCP:  Jani Gravel, MD  Cardiologist:  Ena Dawley, MD  Electrophysiologist: Dr Rayann Heman Referring MD: Jani Gravel, MD   Chief complain: SOB, fatigue  History of Present Illness:    [name] [name] is a 83 y.o. male with a hx of CAD s/p CABG in 2005 x 4 with low risk myoview in 2016. H/o aorta-bifem bypass as well. He was admitted to [facility] on 09/08/2016 for acute on chronic diastolic heart failure as well as symptomatic 3. AVB. Per records, the patient has a long history of bradycardia however had refused PPM implantation in the past. He presented to the hospital with near syncope. Given his worsening symptoms and degree of bradycardia, he required implantation of a permanent pacemaker which was implanted by Dr. Caryl Comes on 09/12/16 (no etiologies identified, he ruled-out for ACS). The device is [employer]. His acute heart failure was treated with diuretics. Echocardiogram revealed normal left ventricular systolic function with an EF of 55-60% and grade 1 diastolic dysfunction.  He was seen by Dr. Rayann Heman on 12/15/2016 for pacemaker placement follow-up and device is working properly.  01/22/2017 - today he states that he initially he felt better with some improved energy, however in the last 3 weeks he has noticed worsening dyspnea on minimal exertion and now addressed. He feels like he can't breathe at all, sleeps on a vegan in his bed. He has also noticed mild lower extremity edema. He has been wheezing a lot and using his rescue inhaler much more frequently than usual. He visited his primary care physician who prescribed Levaquin with no improvement. He denies any fever or chills, he has cough with white clear sputum, however he feels like he is very congested and cannot cough it out. He denies any palpitations or syncope. He feels like he has no energy.   Past Medical History:  Diagnosis Date    . AAA (abdominal aortic aneurysm) [facility])    Surgical repair June, 2014  . Acute on chronic combined systolic and diastolic CHF (congestive heart failure) ([place]) 01/22/2017  . CAD (coronary artery disease)   . Carotid artery occlusion   . Chronic renal insufficiency   . COPD (chronic obstructive pulmonary disease) ([place]) 08/15/2012  . CVA (cerebral vascular accident) ([place])    residual R leg weakness  . Diabetes mellitus without complication ([place])   . Dyslipidemia   . Fall at home Feb.4, 2015   Fx Right Hip  . HTN (hypertension)   . Hx of CABG    2005  . Morbid obesity ([place])   . Neuropathy    Right foot  . Peripheral vascular disease ([place])   . Renal artery stenosis ([place])   . Status post abdominal aortic aneurysm repair    June, 2014  . Tobacco abuse    quit 2014    Past Surgical History:  Procedure Laterality Date  . ABDOMINAL AORTAGRAM N/A 08/07/2012   Performed by Rosetta Posner, MD at [facility] CATH LAB  . ANGIOGRAM EXTREMITY BILATERAL Bilateral 08/07/2012   Performed by Rosetta Posner, MD at [facility] CATH LAB  . AORTA BIFEMORAL BYPASS GRAFT N/A 08/09/2012   Performed by Rosetta Posner, MD at [facility] OR  . ARTHROPLASTY BIPOLAR HIP Right 04/09/2013   Performed by Gearlean Alf, MD at [facility] ORS  . CORONARY ARTERY BYPASS GRAFT  2005   x4  . JOINT REPLACEMENT  Right Feb. 4, 2015   Hip- Pt fell  . Pacemaker Implant N/A 09/11/2016   Performed by Deboraha Sprang, MD at [place] CV LAB  . SPINE SURGERY  1977   Lumbar HNP surgery    Current Medications: Current Meds  Medication Sig  . amLODipine (NORVASC) 5 MG tablet Take 5 mg by mouth daily.  Jearl Klinefelter ELLIPTA 62.5-25 MCG/INH AEPB Inhale 1 puff into the lungs every morning.   Marland Kitchen aspirin 81 MG tablet Take 81 mg by mouth every morning.   Marland Kitchen atorvastatin (LIPITOR) 40 MG tablet Take 40 mg by mouth every morning.   . cholecalciferol (VITAMIN D) 1000 UNITS tablet Take 1,000 Units by mouth every morning.   . escitalopram (LEXAPRO) 5 MG tablet Take 5 mg by  mouth every evening.   . furosemide (LASIX) 20 MG tablet Take 0.5 tablets (10 mg total) by mouth daily as needed for fluid or edema.  . gabapentin (NEURONTIN) 300 MG capsule Take 300 mg by mouth every morning.   Marland Kitchen glucose monitoring kit (FREESTYLE) monitoring kit 1 each as needed by Does not apply route for other.  . hydrALAZINE (APRESOLINE) 50 MG tablet Take 1 tablet (50 mg total) by mouth 3 (three) times daily.  Marland Kitchen levofloxacin (LEVAQUIN) 500 MG tablet Take 500 mg 7 days by mouth.  Marland Kitchen lisinopril (PRINIVIL,ZESTRIL) 5 MG tablet Take 5 mg daily by mouth.  . montelukast (SINGULAIR) 10 MG tablet Take 10 mg by mouth every evening.   Marland Kitchen oxybutynin (DITROPAN-XL) 5 MG 24 hr tablet Take 5 mg by mouth at bedtime.   Marland Kitchen PROAIR HFA 108 (90 BASE) MCG/ACT inhaler Inhale 2 puffs into the lungs every 4 (four) hours as needed for wheezing or shortness of breath.   . TRADJENTA 5 MG TABS tablet Take 5 mg by mouth every morning.  . traMADol (ULTRAM) 50 MG tablet Take 1 tablet by mouth every 6 (six) hours as needed for moderate pain.   . vitamin B-12 (CYANOCOBALAMIN) 100 MCG tablet Take 100 mcg by mouth every morning.   . zolpidem (AMBIEN) 10 MG tablet Take 5 mg by mouth at bedtime.      Allergies:   Patient has no known allergies.   Social History   Socioeconomic History  . Marital status: Married    Spouse name: None  . Number of children: None  . Years of education: None  . Highest education level: None  Social Needs  . Financial resource strain: None  . Food insecurity - worry: None  . Food insecurity - inability: None  . Transportation needs - medical: None  . Transportation needs - non-medical: None  Occupational History  . Occupation: retired  Tobacco Use  . Smoking status: Former Smoker    Packs/day: 0.50    Years: 50.00    Pack years: 25.00    Types: Cigarettes    Last attempt to quit: 08/05/2012    Years since quitting: 4.4  . Smokeless tobacco: Never Used  Substance and Sexual Activity  .  Alcohol use: Yes    Comment: occasionally  . Drug use: No  . Sexual activity: None  Other Topics Concern  . None  Social History Narrative   Pt lives in [place] with spouse.  Retired from [employer].     Family History: The patient's family history includes Coronary artery disease in his father; Tuberculosis (age of onset: 27) in his mother; Varicose Veins in his father. ROS:   Please see the history of present  illness.    All other systems reviewed and are negative.  EKGs/Labs/Other Studies Reviewed:    The following studies were reviewed today:  EKG:  EKG is not ordered today.    Recent Labs: 09/08/2016: ALT 17; B Natriuretic Peptide 724.6; Magnesium 2.0; TSH 3.351 09/10/2016: Hemoglobin 11.6; Platelets 191 09/12/2016: BUN 44; Creatinine, Ser 2.95; Potassium 3.6; Sodium 137  Recent Lipid Panel No results found for: CHOL, TRIG, HDL, CHOLHDL, VLDL, LDLCALC, LDLDIRECT  Physical Exam:    VS:  BP (!) 142/80   Pulse 83   Ht 5' 10"  (1.778 m)   Wt 216 lb (98 kg)   SpO2 93%   BMI 30.99 kg/m     Wt Readings from Last 3 Encounters:  01/22/17 216 lb (98 kg)  12/15/16 215 lb (97.5 kg)  11/03/16 215 lb (97.5 kg)     GEN: Well nourished, appears mildly SOB at rest, appears somnolent HEENT: Normal NECK: No JVD; No carotid bruits LYMPHATICS: No lymphadenopathy CARDIAC: RRR, no murmurs, rubs, gallops RESPIRATORY:  Crackles to auscultation B/L, wheezing or rhonchi  ABDOMEN: Soft, non-tender, non-distended MUSCULOSKELETAL: B/L feet edema; No deformity  SKIN: Warm and dry NEUROLOGIC:  Alert and oriented x 3 PSYCHIATRIC:  Normal affect   TTE: 09/09/2016 - Left ventricle: The cavity size was normal. Wall thickness was   increased in a pattern of mild LVH. Systolic function was normal.   The estimated ejection fraction was in the range of 55% to 60%.   Wall motion was normal; there were no regional wall motion   abnormalities. Doppler parameters are consistent with abnormal   left  ventricular relaxation (grade 1 diastolic dysfunction). - Mitral valve: Mildly calcified annulus. There was mild   regurgitation. - Left atrium: The atrium was mildly to moderately dilated.   ASSESSMENT:    1. Acute on chronic diastolic heart failure ([place])   2. Coronary artery disease involving native coronary artery of native heart without angina pectoris   3. S/P placement of cardiac pacemaker   4. Chronic obstructive pulmonary disease, unspecified COPD type ([place])   5. SOB (shortness of breath)    PLAN:    In order of problems listed above:  The patient is presenting with severe shortness of breath on exertion and at rest with evidence of fluid overload with crackles on his lungs and bilateral lower extremity edema. His O2 saturation at rest is 93% He also complains of absolutely no energy, and chest tightness even at rest. The differential includes acute on chronic combined systolic and diastolic heart failure, acute COPD his examination, he has history of 60 year pack of smoking and severe emphysema, pacemaker induced cardiomyopathy since he feels worse since his pacemaker was implanted.  Plan: - Admit to inpatient, anticipated length of stay 2 nights - Order an echocardiogram to reevaluate LV EF - Start Lasix 40 mg IV twice a day - Follow creatinine closely given CK D stage IV - Hold lisinopril - Evaluate for home O2 therapy - Chest x-ray - Daily labs including BMP and BNP CBC - Breathing treatments - Order an EKG  Signed, Ena Dawley, MD  01/22/2017 4:08 PM    [place]

## 2017-01-23 NOTE — Progress Notes (Signed)
  Echocardiogram 2D Echocardiogram has been performed.  Genessa Beman G Bodie Abernethy 01/23/2017, 10:30 AM

## 2017-01-23 NOTE — Progress Notes (Signed)
Progress Note  Patient Name: [name] [name] Date of Encounter: 01/23/2017  Primary Cardiologist: Dr. Meda Coffee  Subjective   Feeling better, no chest tightness, breathing better  Inpatient Medications    Scheduled Meds: . albuterol  2.5 mg Nebulization Q6H  . amLODipine  5 mg Oral Daily  . aspirin  81 mg Oral BH-q7a  . atorvastatin  40 mg Oral Daily  . cholecalciferol  1,000 Units Oral Daily  . escitalopram  5 mg Oral QPM  . furosemide  40 mg Intravenous BID  . gabapentin  300 mg Oral Daily  . heparin  5,000 Units Subcutaneous Q8H  . hydrALAZINE  50 mg Oral TID  . linagliptin  5 mg Oral Daily  . montelukast  10 mg Oral QHS  . oxybutynin  5 mg Oral QHS  . sodium chloride flush  3 mL Intravenous Q12H  . umeclidinium-vilanterol  1 puff Inhalation Daily  . vitamin B-12  100 mcg Oral Daily  . zolpidem  5 mg Oral QHS   Continuous Infusions: . sodium chloride     PRN Meds: sodium chloride, acetaminophen, ondansetron (ZOFRAN) IV, sodium chloride flush, traMADol   Vital Signs    Vitals:   01/23/17 0026 01/23/17 0604 01/23/17 0803 01/23/17 0823  BP: 118/64 (!) 141/66  130/72  Pulse: 98 91 82 86  Resp: 20 20 18 18   Temp: 98.5 F (36.9 C) 98.2 F (36.8 C)  98.3 F (36.8 C)  TempSrc: Oral Oral  Oral  SpO2: 95% 97% 95% 96%  Weight:  209 lb 6.4 oz (95 kg)    Height:        Intake/Output Summary (Last 24 hours) at 01/23/2017 1241 Last data filed at 01/23/2017 0917 Gross per 24 hour  Intake 540 ml  Output 1350 ml  Net -810 ml   Filed Weights   01/22/17 1929 01/23/17 0604  Weight: 213 lb 3.2 oz (96.7 kg) 209 lb 6.4 oz (95 kg)    Telemetry    AV pacing and sensing occ PVCs - Personally Reviewed  ECG    SR with V pacing - Personally Reviewed  Physical Exam   GEN: No acute distress.   Neck: No JVD Cardiac: RRR, no murmurs, rubs, or gallops.  Respiratory: + rales and occ rhonchi to auscultation bilaterally. GI: Soft, nontender, non-distended  MS: No  edema; No deformity. Neuro:  Nonfocal  Psych: Normal affect   Labs    Chemistry Recent Labs  Lab 01/22/17 1946 01/23/17 0107  NA 138 139  K 3.6 3.2*  CL 105 107  CO2 23 25  GLUCOSE 120* 109*  BUN 34* 35*  CREATININE 2.73* 2.66*  CALCIUM 8.9 8.6*  PROT 7.2  --   ALBUMIN 3.5  --   AST 26  --   ALT 15*  --   ALKPHOS 91  --   BILITOT 0.6  --   GFRNONAA 20* 21*  GFRAA 23* 24*  ANIONGAP 10 7     Hematology Recent Labs  Lab 01/22/17 1946  WBC 10.1  RBC 3.57*  HGB 10.4*  HCT 34.2*  MCV 95.8  MCH 29.1  MCHC 30.4  RDW 14.9  PLT 185    Cardiac Enzymes Recent Labs  Lab 01/22/17 1946 01/23/17 0107 01/23/17 0649  TROPONINI 0.07* 0.10* 0.08*   No results for input(s): TROPIPOC in the last 168 hours.   BNP Recent Labs  Lab 01/22/17 2031  BNP 811.1*     DDimer No results for input(s): DDIMER  in the last 168 hours.   Radiology    Dg Chest 2 View  Result Date: 01/22/2017 CLINICAL DATA:  Shortness of breath.  Congestive heart failure. EXAM: CHEST  2 VIEW COMPARISON:  12/25/2016 FINDINGS: Postoperative changes in the mediastinum. Cardiac pacemaker. Cardiac enlargement with mild vascular congestion. Interstitial changes in the lung bases likely represents mild edema. Small bilateral pleural effusions with basilar atelectasis. The effusions demonstrate progression since previous study. No focal consolidation. No pneumothorax. Calcification of the aorta. Degenerative changes in the spine. IMPRESSION: Cardiac enlargement with vascular congestion and mild interstitial edema. Small bilateral pleural effusions demonstrate progression since previous study. Aortic atherosclerosis. Electronically Signed   By: Lucienne Capers M.D.   On: 01/22/2017 22:46    Cardiac Studies   Echo 01/23/17  Study Conclusions  - Left ventricle: The cavity size was normal. Wall thickness was   normal. Systolic function was mildly reduced. The estimated   ejection fraction was in the range  of 45% to 50%. Hypokinesis of   the basal-midinferolateral and inferior myocardium; consistent   with ischemia or infarction in the distribution of the right   coronary artery. Features are consistent with a pseudonormal left   ventricular filling pattern, with concomitant abnormal relaxation   and increased filling pressure (grade 2 diastolic dysfunction). - Ventricular septum: Septal motion showed abnormal function,   dyssynergy, and paradox. These changes are consistent with right   ventricular pacing. - Mitral valve: There was mild to moderate regurgitation directed   centrally. - Left atrium: The atrium was moderately dilated. - Pulmonary arteries: PA peak pressure: 32 mm Hg (S).  EF is down from 55-60% now G2 DD   Echo 09/2016  Study Conclusions  - Left ventricle: The cavity size was normal. Wall thickness was   increased in a pattern of mild LVH. Systolic function was normal.   The estimated ejection fraction was in the range of 55% to 60%.   Wall motion was normal; there were no regional wall motion   abnormalities. Doppler parameters are consistent with abnormal   left ventricular relaxation (grade 1 diastolic dysfunction). - Mitral valve: Mildly calcified annulus. There was mild   regurgitation. - Left atrium: The atrium was mildly to moderately dilated  Patient Profile     83 y.o. male with hx of 2:1/CHB AV block with PPM, diastolic HF,  CAD with hx of CABG, CKD -4, PAD with H/o aorta-bifem bypass now admitted with heart failure, decreased energy.    Assessment & Plan    DOE and at rest, with HF acute on chronic diastolic and systolic HF BNP 478--GN on CXR --placed on lasix 40 IV BID --holding lisinopril with CKD 4--continue IV lasix --negative 810 since admit with wt decrease from 213 to 209 lbs  Chest tightness with CAD last nuc 2016 with low risk study with fixed apical and inf perfusion defects no reversible ischemia EF 50%  --troponin 0.07;0.10;0.08 most likely  demand ischemia  --no further tightness  CAD with hx CABG 2005 neg last nuc study last cath 2005  Hx PPM for [place] 2/CHB  Stable --SR with V pacing  COPD check sp02 with ambulation once diuresed - recent bronchitis treated by PCP  CKD stage 4 Cr on admit 2.73 and now 2.66.   Hypokalemia at 3.2 replace low dose of 20 meg.      For questions or updates, please contact [place] Please consult [email or web address] for contact info under Cardiology/STEMI.      Signed, Mickel Baas  Dorene Ar, NP  01/23/2017, 12:41 PM

## 2017-01-23 NOTE — Progress Notes (Signed)
Troponin increased from 0.07 to 0.1. Pt resting comfortably in bed. K+ = 3.2. MD paged. Will continue to monitor.

## 2017-01-24 ENCOUNTER — Other Ambulatory Visit (HOSPITAL_COMMUNITY): Payer: Federal, State, Local not specified - PPO

## 2017-01-24 DIAGNOSIS — E785 Hyperlipidemia, unspecified: Secondary | ICD-10-CM

## 2017-01-24 DIAGNOSIS — Z950 Presence of cardiac pacemaker: Secondary | ICD-10-CM | POA: Diagnosis present

## 2017-01-24 DIAGNOSIS — I5043 Acute on chronic combined systolic (congestive) and diastolic (congestive) heart failure: Secondary | ICD-10-CM

## 2017-01-24 DIAGNOSIS — N183 Chronic kidney disease, stage 3 (moderate): Secondary | ICD-10-CM

## 2017-01-24 LAB — BASIC METABOLIC PANEL
Anion gap: 10 (ref 5–15)
BUN: 35 mg/dL — AB (ref 6–20)
CO2: 27 mmol/L (ref 22–32)
Calcium: 8.8 mg/dL — ABNORMAL LOW (ref 8.9–10.3)
Chloride: 102 mmol/L (ref 101–111)
Creatinine, Ser: 2.86 mg/dL — ABNORMAL HIGH (ref 0.61–1.24)
GFR calc Af Amer: 22 mL/min — ABNORMAL LOW (ref >60)
GFR, EST NON AFRICAN AMERICAN: 19 mL/min — AB (ref >60)
GLUCOSE: 111 mg/dL — AB (ref 65–99)
POTASSIUM: 3.1 mmol/L — AB (ref 3.5–5.1)
Sodium: 139 mmol/L (ref 135–145)

## 2017-01-24 MED ORDER — ALBUTEROL SULFATE (2.5 MG/3ML) 0.083% IN NEBU: 2.5000 mg | INHALATION_SOLUTION | RESPIRATORY_TRACT | 12 refills | Status: DC | PRN

## 2017-01-24 MED ORDER — ALBUTEROL SULFATE (2.5 MG/3ML) 0.083% IN NEBU: 2.5000 mg | INHALATION_SOLUTION | RESPIRATORY_TRACT | Status: DC | PRN

## 2017-01-24 MED ORDER — ALBUTEROL SULFATE (2.5 MG/3ML) 0.083% IN NEBU: 3.0000 mL | INHALATION_SOLUTION | RESPIRATORY_TRACT | Status: DC | PRN

## 2017-01-24 MED ORDER — PROAIR HFA 108 (90 BASE) MCG/ACT IN AERS: 2.0000 | INHALATION_SPRAY | RESPIRATORY_TRACT | 6 refills | Status: DC | PRN

## 2017-01-24 MED ORDER — FUROSEMIDE 20 MG PO TABS: 20.0000 mg | ORAL_TABLET | Freq: Every day | ORAL | 3 refills | Status: DC | PRN

## 2017-01-24 MED ORDER — ALBUTEROL SULFATE (2.5 MG/3ML) 0.083% IN NEBU
2.5000 mg | INHALATION_SOLUTION | Freq: Three times a day (TID) | RESPIRATORY_TRACT | Status: DC
  Filled 2017-01-24: qty 3

## 2017-01-24 NOTE — Discharge Summary (Signed)
Discharge Summary    Patient ID: [name] [name],  MRN: [id number], DOB/AGE: 09-11-1931 83 y.o.  Admit date: 01/22/2017 Discharge date: 01/24/2017  Primary Care Provider: Jani Gravel Primary Cardiologist: Dr. Meda Coffee, Dr. Rayann Heman  Discharge Diagnoses    Principal Problem:   Acute on chronic combined systolic and diastolic CHF (congestive heart failure) ([place]) Active Problems:   Essential hypertension   Dyslipidemia   Renal artery stenosis (HCC)   Tobacco abuse   Hx of CABG   CAD (coronary artery disease)   COPD (chronic obstructive pulmonary disease) (HCC)   Heart block AV second degree - episodic   PAD (peripheral artery disease)--bifemoral bypass graft.   DM2 (diabetes mellitus, type 2) (HCC)   CKD (chronic kidney disease) stage 3, GFR 30-59 ml/min (HCC)   S/P placement of cardiac pacemaker   Presence of permanent cardiac pacemaker   Allergies No Known Allergies  Diagnostic Studies/Procedures    Echo 01/23/17  Study Conclusions - Left ventricle: The cavity size was normal. Wall thickness was normal. Systolic function was mildly reduced. The estimated ejection fraction was in the range of 45% to 50%. Hypokinesis of the basal-midinferolateral and inferior myocardium; consistent with ischemia or infarction in the distribution of the right coronary artery. Features are consistent with a pseudonormal left ventricular filling pattern, with concomitant abnormal relaxation and increased filling pressure (grade 2 diastolic dysfunction). - Ventricular septum: Septal motion showed abnormal function, dyssynergy, and paradox. These changes are consistent with right ventricular pacing. - Mitral valve: There was mild to moderate regurgitation directed centrally. - Left atrium: The atrium was moderately dilated. - Pulmonary arteries: PA peak pressure: 32 mm Hg (S).  EF is down from 55-60% now G2 DD    Echo 09/2016  Study Conclusions - Left ventricle:  The cavity size was normal. Wall thickness was increased in a pattern of mild LVH. Systolic function was normal. The estimated ejection fraction was in the range of 55% to 60%. Wall motion was normal; there were no regional wall motion abnormalities. Doppler parameters are consistent with abnormal left ventricular relaxation (grade 1 diastolic dysfunction). - Mitral valve: Mildly calcified annulus. There was mild regurgitation. - Left atrium: The atrium was mildly to moderately dilated     History of Present Illness     [name] [name] is a 83 y.o. male with a hx of CAD s/p CABG in 2005 x 4 with low risk myoview in 2016. H/o aorta-bifem bypass as well. He was admitted to [facility] on 09/08/2016 for acute on chronic diastolic heart failure as well as symptomatic 3. AVB. Per records, the patient has a long history of bradycardia however had refused PPM implantation in the past. He presented to the hospital with near syncope. Given his worsening symptoms and degree of bradycardia, he required implantation of a permanent pacemaker which was implanted by Dr. Caryl Comes on 09/12/16 (no etiologies identified, he ruled-out for ACS). The device is Medtronic. His acute heart failure was treated with diuretics. Echocardiogram revealed normal left ventricular systolic function with an EF of 55-60% and grade 1 diastolic dysfunction.  He was seen by Dr. Rayann Heman on 12/15/2016 for pacemaker placement follow-up and device is working properly.  01/22/2017 - today he states that he initially he felt better with some improved energy, however in the last 3 weeks he has noticed worsening dyspnea on minimal exertion and now addressed. He feels like he can't breathe at all. He has also noticed mild lower extremity edema. He has been wheezing  a lot and using his rescue inhaler much more frequently than usual. He visited his primary care physician who prescribed Levaquin with no improvement. He denies any fever or chills, he  has cough with white clear sputum, however he feels like he is very congested and cannot cough it out. He denies any palpitations or syncope. He feels like he has no energy.    Hospital Course     Consultants: none   Mr. Haycraft was admitted and started on IV lasix. He diuresed well on 40 mg IV BID. Lisinopril was held for IV diuresis given his CKD. He was transitioned to 20 mg PO lasix and continued to diurese.  Diuresis At discharge, he was overall net negative 1.6 L with 1.7 L urine output yesterday. His weight is 204 lbs, down from 213 lbs. She was discharged on 20 mg PO lasix daily.  CKD stage IV Serum creatinine remained stable during her hospitalization. At discharge, sCr 2.86; baseline appears to be 2.95.   COPD Patient is breathing better, requiring no supplemental oxygen.  CAD s/p CABG (2005), negative stress test 02/01/15  She denies chest pain. Troponins were mildly elevated and flat in a pattern consistent with demand ischemia in the setting of CHF exacerbation.   CHB s.p PPM PPM functioning well   _____________  Discharge Vitals Blood pressure 129/65, pulse 88, temperature 98.3 F (36.8 C), temperature source Oral, resp. rate 18, height _0  (1.778 m), weight 204 lb 11.2 oz (92.9 kg), SpO2 93 %.  Filed Weights   01/22/17 1929 01/23/17 0604 01/24/17 0530  Weight: 213 lb 3.2 oz (96.7 kg) 209 lb 6.4 oz (95 kg) 204 lb 11.2 oz (92.9 kg)    Physical Exam  Constitutional: He is oriented to person, place, and time and well-developed, well-nourished, and in no distress.  HENT:  Head: Normocephalic and atraumatic.  Cardiovascular: Normal rate, regular rhythm and normal heart sounds.  No murmur heard. Pulmonary/Chest: Effort normal and breath sounds normal.  Abdominal: Soft.  Musculoskeletal:  Trace edema  Neurological: He is alert and oriented to person, place, and time.  Skin: Skin is warm.  Psychiatric: Affect normal.      Labs & Radiologic Studies     CBC Recent Labs    01/22/17 1946  WBC 10.1  NEUTROABS 7.3  HGB 10.4*  HCT 34.2*  MCV 95.8  PLT 329   Basic Metabolic Panel Recent Labs    01/23/17 0107 01/24/17 0520  NA 139 139  K 3.2* 3.1*  CL 107 102  CO2 25 27  GLUCOSE 109* 111*  BUN 35* 35*  CREATININE 2.66* 2.86*  CALCIUM 8.6* 8.8*   Liver Function Tests Recent Labs    01/22/17 1946  AST 26  ALT 15*  ALKPHOS 91  BILITOT 0.6  PROT 7.2  ALBUMIN 3.5   No results for input(s): LIPASE, AMYLASE in the last 72 hours. Cardiac Enzymes Recent Labs    01/22/17 1946 01/23/17 0107 01/23/17 0649  TROPONINI 0.07* 0.10* 0.08*   BNP Invalid input(s): POCBNP D-Dimer No results for input(s): DDIMER in the last 72 hours. Hemoglobin A1C No results for input(s): HGBA1C in the last 72 hours. Fasting Lipid Panel No results for input(s): CHOL, HDL, LDLCALC, TRIG, CHOLHDL, LDLDIRECT in the last 72 hours. Thyroid Function Tests Recent Labs    01/22/17 2032  TSH 2.997   _____________  Dg Chest 2 View  Result Date: 01/22/2017 CLINICAL DATA:  Shortness of breath.  Congestive heart failure. EXAM: CHEST  2 VIEW COMPARISON:  12/25/2016 FINDINGS: Postoperative changes in the mediastinum. Cardiac pacemaker. Cardiac enlargement with mild vascular congestion. Interstitial changes in the lung bases likely represents mild edema. Small bilateral pleural effusions with basilar atelectasis. The effusions demonstrate progression since previous study. No focal consolidation. No pneumothorax. Calcification of the aorta. Degenerative changes in the spine. IMPRESSION: Cardiac enlargement with vascular congestion and mild interstitial edema. Small bilateral pleural effusions demonstrate progression since previous study. Aortic atherosclerosis. Electronically Signed   By: Lucienne Capers M.D.   On: 01/22/2017 22:46   Disposition   Pt is being discharged home today in good condition.  Follow-up Plans & Appointments    Follow-up  Information    [facility] Follow up.   Specialty:  [place] Why:  Home Health RN- agency will call to arrange initial visit Contact information: [place] [address] [phone]        Charlie Pitter, PA-C Follow up on 02/05/2017.   Specialties:  Cardiology, Radiology Why:  10:30 am TCM Contact information: [address] [place] [address] [place] [address] [phone]          Discharge Instructions    Diet - low sodium heart healthy   Complete by:  As directed    Increase activity slowly   Complete by:  As directed       Discharge Medications   Current Discharge Medication List    START taking these medications   Details  albuterol (PROVENTIL) (2.5 MG/3ML) 0.083% nebulizer solution Take 3 mLs (2.5 mg total) by nebulization every 4 (four) hours as needed for wheezing or shortness of breath. Qty: 75 mL, Refills: 12      CONTINUE these medications which have CHANGED   Details  furosemide (LASIX) 20 MG tablet Take 1 tablet (20 mg total) by mouth daily as needed for fluid or edema. Qty: 30 tablet, Refills: 3   Associated Diagnoses: Heart block AV second degree; Essential hypertension; Dizziness    PROAIR HFA 108 (90 Base) MCG/ACT inhaler Inhale 2 puffs into the lungs every 4 (four) hours as needed for wheezing or shortness of breath. Qty: 1 Inhaler, Refills: 6      CONTINUE these medications which have NOT CHANGED   Details  amLODipine (NORVASC) 5 MG tablet Take 5 mg by mouth daily.    ANORO ELLIPTA 62.5-25 MCG/INH AEPB Inhale 1 puff into the lungs every morning.     aspirin 81 MG tablet Take 81 mg by mouth every morning.     atorvastatin (LIPITOR) 40 MG tablet Take 40 mg by mouth every morning.     cholecalciferol (VITAMIN D) 1000 UNITS tablet Take 1,000 Units by mouth every morning.     escitalopram (LEXAPRO) 5 MG tablet Take 5 mg by mouth every evening.     gabapentin (NEURONTIN) 300 MG capsule Take 300  mg by mouth every morning.     hydrALAZINE (APRESOLINE) 50 MG tablet Take 1 tablet (50 mg total) by mouth 3 (three) times daily. Qty: 90 tablet, Refills: 6    montelukast (SINGULAIR) 10 MG tablet Take 10 mg by mouth every evening.     oxybutynin (DITROPAN-XL) 5 MG 24 hr tablet Take 5 mg by mouth at bedtime.     TRADJENTA 5 MG TABS tablet Take 5 mg by mouth every morning.    traMADol (ULTRAM) 50 MG tablet Take 1 tablet by mouth every 6 (six) hours as needed for moderate pain.  vitamin B-12 (CYANOCOBALAMIN) 100 MCG tablet Take 100 mcg by mouth every morning.     zolpidem (AMBIEN) 10 MG tablet Take 5 mg by mouth at bedtime.     glucose monitoring kit (FREESTYLE) monitoring kit 1 each as needed by Does not apply route for other.      STOP taking these medications     levofloxacin (LEVAQUIN) 500 MG tablet            Outstanding Labs/Studies   BMP  Duration of Discharge Encounter   Greater than 30 minutes including physician time.  Signed, Tami Lin Robet Crutchfield NP 01/24/2017, 10:22 AM

## 2017-01-24 NOTE — Progress Notes (Signed)
Nebulizer machine ordered as requested and to be delivered to the room today prior to discharging home; Aneta Mins [phone]

## 2017-01-24 NOTE — Progress Notes (Signed)
Patient discharged to home, wife at bedside to transport. Discharge instructions and follow up appointment discussed with wife ,verbalized understanding.PIV removed no s/s of infiltration noted. Nebulizer machine will be picked up by wife at [facility] in [place].

## 2017-01-25 LAB — GLUCOSE, CAPILLARY: Glucose-Capillary: 111 mg/dL — ABNORMAL HIGH (ref 65–99)

## 2017-01-29 DIAGNOSIS — E1151 Type 2 diabetes mellitus with diabetic peripheral angiopathy without gangrene: Secondary | ICD-10-CM | POA: Diagnosis not present

## 2017-01-29 DIAGNOSIS — I441 Atrioventricular block, second degree: Secondary | ICD-10-CM | POA: Diagnosis not present

## 2017-01-29 DIAGNOSIS — E1122 Type 2 diabetes mellitus with diabetic chronic kidney disease: Secondary | ICD-10-CM | POA: Diagnosis not present

## 2017-01-29 DIAGNOSIS — I5033 Acute on chronic diastolic (congestive) heart failure: Secondary | ICD-10-CM | POA: Diagnosis not present

## 2017-01-29 DIAGNOSIS — I130 Hypertensive heart and chronic kidney disease with heart failure and stage 1 through stage 4 chronic kidney disease, or unspecified chronic kidney disease: Secondary | ICD-10-CM | POA: Diagnosis not present

## 2017-01-29 DIAGNOSIS — Z48812 Encounter for surgical aftercare following surgery on the circulatory system: Secondary | ICD-10-CM | POA: Diagnosis not present

## 2017-02-02 DIAGNOSIS — E1122 Type 2 diabetes mellitus with diabetic chronic kidney disease: Secondary | ICD-10-CM | POA: Diagnosis not present

## 2017-02-02 DIAGNOSIS — I5033 Acute on chronic diastolic (congestive) heart failure: Secondary | ICD-10-CM | POA: Diagnosis not present

## 2017-02-02 DIAGNOSIS — I441 Atrioventricular block, second degree: Secondary | ICD-10-CM | POA: Diagnosis not present

## 2017-02-02 DIAGNOSIS — E1151 Type 2 diabetes mellitus with diabetic peripheral angiopathy without gangrene: Secondary | ICD-10-CM | POA: Diagnosis not present

## 2017-02-02 DIAGNOSIS — I130 Hypertensive heart and chronic kidney disease with heart failure and stage 1 through stage 4 chronic kidney disease, or unspecified chronic kidney disease: Secondary | ICD-10-CM | POA: Diagnosis not present

## 2017-02-02 DIAGNOSIS — Z48812 Encounter for surgical aftercare following surgery on the circulatory system: Secondary | ICD-10-CM | POA: Diagnosis not present

## 2017-02-05 ENCOUNTER — Encounter: Payer: Self-pay | Admitting: Physician Assistant

## 2017-02-05 ENCOUNTER — Ambulatory Visit: Payer: Federal, State, Local not specified - PPO | Admitting: Physician Assistant

## 2017-02-05 NOTE — Progress Notes (Signed)
Cardiology Office Note    Date:  02/06/2017  ID:  BRAVE DACK, DOB January 24, 1932, MRN [id number] PCP:  Jani Gravel, MD  Cardiologist:  Dr. Meda Coffee Nephrologist:  Dr. Arty Baumgartner   Chief Complaint: f/u CHF  History of Present Illness:  [name] [name] is a 83 y.o. male with history of CAD s/p CABG in 2005 x 4, PAD s/p aorto-bifem bypass, chronic combined CHF, longstanding bradycardia (previously refused pacer, then s/p Medtronic PPM 09/2016 due to worsening symptoms/CHB), COPD, CKD stage IV, chronic anemia, AAA repair 2014, carotid artery disease, stroke, DM, dyslipidemia, fall/hip fx, HTN, tobacco abuse who presents for post-hospital follow-up. He was recently admitted with worsening CHF requring diuresis with IV Lasix. Lisinopril was held due to advanced kidney disease. Troponins were mildly elevated and flat in a pattern consistent with demand ischemia in the setting of CHF exacerbation. Device was found to be working properly. He diuresed 213->204lb. He was asked to hold his Lasix day of DC then resume 37m on 11/20. 2D Echo 01/23/17 showed EF 45-50% with hypokinesis in the basal mid inferolateral and inferior myocardium c/w ischemia/infarct of RCA, gade 2 DD, RV pacing, mild-mod TR, modl LAE, PASP 32. Prior EF in 09/2016 was 55-60%. Last labs showed K 3.1, Cr 2.86 (c/w prior values), normal TSH, Hgb 10.4 (c/w prior values but fluctuating). No vascular studies in recent years. Renal duplex 2016 neg for RAS.  He presents back for follow-up with his wife. He had 3-4 good days after discharge from the hospital but has had intermittent good and bad days since discharge. On bad days he tends to feel short of breath all day long and somewhat congested. On moments when his breathing gets particularly bad, he does experience some chest tightness. On some occasions his wife has considered taking him into urgent care for days when his breathing is worse. He has had generalized insomnia as well so has not been  sleeping well. Neb helps sometimes. He gained about 5lb since discharge from hospital with mild recurrence of edema. He says today is one of his good days where he does not feel particularly bad, but he and his wife remain wary of his tumultuous course lately. Pulse ox wnl. Nephrologist is Dr. CArty Baumgartner Pt denies prior discussions regarding HD planning and does not seem particularly interested in the idea should this become a pressing issue, but also has not been faced with this decision.   Past Medical History:  Diagnosis Date  . AAA (abdominal aortic aneurysm) ([facility]    Surgical repair June, 2014  . CAD (coronary artery disease)    a. s/p CABG in 2005 x 4. b. low risk nuc 2016.  . Carotid artery occlusion   . Chronic anemia   . Chronic combined systolic and diastolic CHF (congestive heart failure) ([place]   . Chronic renal insufficiency   . CKD (chronic kidney disease), stage III ([place]   . Complete heart block ([facility]   . COPD (chronic obstructive pulmonary disease) ([place] 08/15/2012  . CVA (cerebral vascular accident) ([facility] [place] 2003   residual R leg weakness (01/22/2017)  . Dyslipidemia   . Fall at home 04/09/2013   Fx Right Hip  . HTN (hypertension)   . Hx of CABG    2005  . Morbid obesity ([facility]   . Myocardial infarction ([facility]    "was told I had one before I had my heart bypass" (01/22/2017)  . Neuropathy    Right foot  . Peripheral vascular disease ([place]  a. s/p aorto-bifem bypass.  . Presence of permanent cardiac pacemaker   . S/P placement of cardiac pacemaker    a. Medtronic 09/2016.  . Status post abdominal aortic aneurysm repair    June, 2014  . Tobacco abuse    quit 2014  . Type II diabetes mellitus ([place])     Past Surgical History:  Procedure Laterality Date  . ABDOMINAL AORTAGRAM N/A 08/07/2012   Procedure: ABDOMINAL AORTAGRAM;  Surgeon: Rosetta Posner, MD;  Location: [facility] CATH LAB;  Service: Cardiovascular;  Laterality: N/A;  . AORTA - BILATERAL FEMORAL ARTERY BYPASS  GRAFT N/A 08/09/2012   Procedure: AORTA BIFEMORAL BYPASS GRAFT;  Surgeon: Rosetta Posner, MD;  Location: [facility];  Service: Vascular;  Laterality: N/A;  . CORONARY ARTERY BYPASS GRAFT  2005   CABG X"5"  . HIP ARTHROPLASTY Right 04/09/2013   Procedure: ARTHROPLASTY BIPOLAR HIP;  Surgeon: Gearlean Alf, MD;  Location: [facility] ORS;  Service: Orthopedics;  Laterality: Right;  . INSERT / REPLACE / REMOVE PACEMAKER    . JOINT REPLACEMENT    . LUMBAR DISC SURGERY  1977   Lumbar HNP surgery  . PACEMAKER IMPLANT N/A 09/11/2016   Procedure: Pacemaker Implant;  Surgeon: Deboraha Sprang, MD;  Location: [facility] CV LAB;  Service: Cardiovascular;  Laterality: N/A;  . TONSILLECTOMY  1940    Current Medications: Current Meds  Medication Sig  . albuterol (PROVENTIL) (2.5 MG/3ML) 0.083% nebulizer solution Take 3 mLs (2.5 mg total) by nebulization every 4 (four) hours as needed for wheezing or shortness of breath.  Marland Kitchen amLODipine (NORVASC) 5 MG tablet Take 5 mg by mouth daily.  Jearl Klinefelter ELLIPTA 62.5-25 MCG/INH AEPB Inhale 1 puff into the lungs every morning.   Marland Kitchen aspirin 81 MG tablet Take 81 mg by mouth every morning.   Marland Kitchen atorvastatin (LIPITOR) 40 MG tablet Take 40 mg by mouth every morning.   . cholecalciferol (VITAMIN D) 1000 UNITS tablet Take 1,000 Units by mouth every morning.   . escitalopram (LEXAPRO) 5 MG tablet Take 5 mg by mouth every evening.   . furosemide (LASIX) 20 MG tablet Take 1 tablet (20 mg total) by mouth daily as needed for fluid or edema.  . gabapentin (NEURONTIN) 300 MG capsule Take 300 mg by mouth every morning.   Marland Kitchen glucose monitoring kit (FREESTYLE) monitoring kit 1 each as needed by Does not apply route for other.  . hydrALAZINE (APRESOLINE) 50 MG tablet Take 1 tablet (50 mg total) by mouth 3 (three) times daily.  Marland Kitchen levofloxacin (LEVAQUIN) 500 MG tablet Take 500 mg by mouth daily. For 7 days/3 days left  . montelukast (SINGULAIR) 10 MG tablet Take 10 mg by mouth every evening.   Marland Kitchen oxybutynin  (DITROPAN-XL) 5 MG 24 hr tablet Take 5 mg by mouth at bedtime.   Marland Kitchen PROAIR HFA 108 (90 Base) MCG/ACT inhaler Inhale 2 puffs into the lungs every 4 (four) hours as needed for wheezing or shortness of breath.  . TRADJENTA 5 MG TABS tablet Take 5 mg by mouth every morning.  . traMADol (ULTRAM) 50 MG tablet Take 1 tablet by mouth every 6 (six) hours as needed for moderate pain.   . vitamin B-12 (CYANOCOBALAMIN) 100 MCG tablet Take 100 mcg by mouth every morning.   . zolpidem (AMBIEN) 10 MG tablet Take 5 mg by mouth at bedtime.      Allergies:   Patient has no known allergies.   Social History   Socioeconomic History  .  Marital status: Married    Spouse name: None  . Number of children: None  . Years of education: None  . Highest education level: None  Social Needs  . Financial resource strain: None  . Food insecurity - worry: None  . Food insecurity - inability: None  . Transportation needs - medical: None  . Transportation needs - non-medical: None  Occupational History  . Occupation: retired  Tobacco Use  . Smoking status: Former Smoker    Packs/day: 0.50    Years: 50.00    Pack years: 25.00    Types: Cigarettes    Last attempt to quit: 08/05/2012    Years since quitting: 4.5  . Smokeless tobacco: Never Used  Substance and Sexual Activity  . Alcohol use: Yes    Comment: 01/22/2017 "quit drinking ~ 2015"  . Drug use: No  . Sexual activity: Not Currently  Other Topics Concern  . None  Social History Narrative   Pt lives in [place] with spouse.  Retired from [employer].     Family History:  Family History  Problem Relation Age of Onset  . Tuberculosis Mother 41  . Coronary artery disease Father   . Varicose Veins Father    ROS:   Please see the history of present illness.  All other systems are reviewed and otherwise negative.    PHYSICAL EXAM:   VS:  BP 120/70   Pulse 80   Ht 5' 10"  (1.778 m)   Wt 209 lb (94.8 kg)   SpO2 96%   BMI 29.99 kg/m   BMI: Body mass  index is 29.99 kg/m. GEN: Well nourished, well developed chronically ill appearing elderly WM, in no acute distress  HEENT: normocephalic, atraumatic Neck: no JVD, carotid bruits, or masses Cardiac: RRR; no murmurs, rubs, or gallops, no edema  Respiratory:  Diminished BS throughout, no wheezes, rales or rhonchi, normal work of breathing GI: soft, nontender, nondistended, + BS MS: no deformity or atrophy  Skin: warm and dry, no rash Neuro:  Alert and Oriented x 3, Strength and sensation are intact, follows commands Psych: euthymic mood, full affect  Wt Readings from Last 3 Encounters:  02/06/17 209 lb (94.8 kg)  01/24/17 204 lb 11.2 oz (92.9 kg)  01/22/17 216 lb (98 kg)      Studies/Labs Reviewed:   EKG: EKG was not ordered today. Prior EKGs v paced.  Recent Labs: 09/08/2016: Magnesium 2.0 01/22/2017: ALT 15; B Natriuretic Peptide 811.1; Hemoglobin 10.4; Platelets 185; TSH 2.997 01/24/2017: BUN 35; Creatinine, Ser 2.86; Potassium 3.1; Sodium 139   Lipid Panel No results found for: CHOL, TRIG, HDL, CHOLHDL, VLDL, LDLCALC, LDLDIRECT  Additional studies/ records that were reviewed today include: Summarized above.    ASSESSMENT & PLAN:   1. Acute on chronic combined CHF- I suspect multifactorial dyspnea in the setting of CHF, CAD, and low pulmonary reserve with suspected COPD (prior smoker for 50-60 years). Given the recent drop in his EF, I would be concerned for progressive CAD causing ischemia and worsening CHF. However, his comorbidities including advanced kidney disease, prior fall/hip fracture, and general frailty make him a poor candidate for invasive ischemic testing. His wife says it is difficult for him to even do anything these days as he is so weak. Will attempt to optimize his regimen by discontinuing amlodipine (given LV dysfunction) and adding Imdur 99m daily. Will obtain stat BMET and BNP to decide about increasing Lasix. He required 468mIV BID in the hospital for  diuresis. His  discharge paperwork indicated to take Lasix 23m daily PRN although it seems as though this was intended to be given daily. His wife was initially administering it PRN, but in the last 4 days has been giving it consistently (with mild improvement in symptoms as of today, but not yet back to baseline). I suspect he will require more standing diuretic. Will plan to titrate Lasix if creatinine is stable. His recent baseline has ranged from 2.5 to 3. He might be nearing a point with his renal function where his creatinine may have to run higher for him to maintain euvolemia. His prognosis is quite guarded in the setting of mulitple comorbidities and I did express to him my concern that there are organ systems working against each other here, and difficult decisions may lie ahead. 2. CAD - see above. Continue aspirin, statin. Consideration could be given to starting beta blocker in the future now that he has pacemaker in place, but will start in stepwise approach particularly in light of COPD. 3. CKD stage IV - check BMET today. Advised to avoid NSAIDs. 4. COPD - he was sent home with home neb. He did not require O2 going out of the hospital but I do not see formal exertional testing obtained. He did not bring his walker with him today. Resting O2 sat was 92-96%. He will bring this next visit so we can walk with him and check ambulatory O2 sat. He does not have a pulmonologist. Will refer. 5. PAD - he is overdue for vascular follow-up. This is a less pressing issue at present but I did advise he get back into see them at some point.  Disposition: F/u with me/Dr. Nelson's care team in 5-7 days. Warning symptoms reviewed.  Medication Adjustments/Labs and Tests Ordered: Current medicines are reviewed at length with the patient today.  Concerns regarding medicines are outlined above. Medication changes, Labs and Tests ordered today are summarized above and listed in the Patient Instructions accessible  in Encounters.   Signed, DCharlie Pitter PA-C  02/06/2017 12:01 PM    [facility] [address] [place]  [id number]: [phone] Fax: [phone]

## 2017-02-06 ENCOUNTER — Encounter (INDEPENDENT_AMBULATORY_CARE_PROVIDER_SITE_OTHER): Payer: Self-pay

## 2017-02-06 ENCOUNTER — Telehealth: Payer: Self-pay

## 2017-02-06 ENCOUNTER — Encounter: Payer: Self-pay | Admitting: Physician Assistant

## 2017-02-06 ENCOUNTER — Ambulatory Visit (INDEPENDENT_AMBULATORY_CARE_PROVIDER_SITE_OTHER): Payer: Medicare Other | Admitting: Physician Assistant

## 2017-02-06 VITALS — BP 120/70 | HR 80 | Ht 70.0 in | Wt 209.0 lb

## 2017-02-06 DIAGNOSIS — R42 Dizziness and giddiness: Secondary | ICD-10-CM

## 2017-02-06 DIAGNOSIS — N184 Chronic kidney disease, stage 4 (severe): Secondary | ICD-10-CM | POA: Diagnosis not present

## 2017-02-06 DIAGNOSIS — I2510 Atherosclerotic heart disease of native coronary artery without angina pectoris: Secondary | ICD-10-CM | POA: Diagnosis not present

## 2017-02-06 DIAGNOSIS — I10 Essential (primary) hypertension: Secondary | ICD-10-CM

## 2017-02-06 DIAGNOSIS — J449 Chronic obstructive pulmonary disease, unspecified: Secondary | ICD-10-CM

## 2017-02-06 DIAGNOSIS — I739 Peripheral vascular disease, unspecified: Secondary | ICD-10-CM

## 2017-02-06 DIAGNOSIS — I5043 Acute on chronic combined systolic (congestive) and diastolic (congestive) heart failure: Secondary | ICD-10-CM | POA: Diagnosis not present

## 2017-02-06 DIAGNOSIS — I441 Atrioventricular block, second degree: Secondary | ICD-10-CM

## 2017-02-06 LAB — BASIC METABOLIC PANEL
BUN/Creatinine Ratio: 12 (ref 10–24)
BUN: 31 mg/dL — ABNORMAL HIGH (ref 8–27)
CALCIUM: 9.3 mg/dL (ref 8.6–10.2)
CHLORIDE: 105 mmol/L (ref 96–106)
CO2: 29 mmol/L (ref 20–29)
Creatinine, Ser: 2.53 mg/dL — ABNORMAL HIGH (ref 0.76–1.27)
GFR calc Af Amer: 26 mL/min/{1.73_m2} — ABNORMAL LOW (ref >59)
GFR, EST NON AFRICAN AMERICAN: 23 mL/min/{1.73_m2} — AB (ref >59)
GLUCOSE: 98 mg/dL (ref 65–99)
POTASSIUM: 3.8 mmol/L (ref 3.5–5.2)
Sodium: 142 mmol/L (ref 134–144)

## 2017-02-06 LAB — PRO B NATRIURETIC PEPTIDE: NT-PRO BNP: 2488 pg/mL — AB (ref 0–486)

## 2017-02-06 MED ORDER — ISOSORBIDE MONONITRATE ER 30 MG PO TB24: 30.0000 mg | ORAL_TABLET | Freq: Every day | ORAL | 3 refills | Status: DC

## 2017-02-06 MED ORDER — POTASSIUM CHLORIDE ER 10 MEQ PO TBCR: 10.0000 meq | EXTENDED_RELEASE_TABLET | Freq: Every day | ORAL | 11 refills | Status: DC

## 2017-02-06 MED ORDER — FUROSEMIDE 40 MG PO TABS: 40.0000 mg | ORAL_TABLET | Freq: Every day | ORAL | 11 refills | Status: DC

## 2017-02-06 NOTE — Patient Instructions (Addendum)
Medication Instructions:  1) STOP AMLODIPINE 2) START IMDUR 30 mg daily  Labwork: TODAY: STAT BMET AND BNP  Testing/Procedures: None  Follow-Up: You have been referred to PULMONARY.  You have an appointment scheduled with Melina Copa next Monday, December 10 at 11:30AM.  Any Other Special Instructions Will Be Listed Below (If Applicable).     If you need a refill on your cardiac medications before your next appointment, please call your pharmacy.

## 2017-02-06 NOTE — Telephone Encounter (Signed)
-----   Message from Charlie Pitter, [place] sent at 02/06/2017  4:23 PM EST ----- Please let patient know that renal function appears improved from previous but I am guessing this is because he is fluid overloaded. I would advise: - assuming that he took 20mg  of Lasix today, please take take an extra 40mg  of Lasix today - so should take 60mg  total today - tomorrow, take 40mg  QAM, 20mg  QPM - then begin Lasix 40mg  daily on Thursday - fluid restrict to 64 oz fluid total max, 2000mg  sodium max - start KCl 76meq daily (need to be cautious given his renal function, so chose lowest dose -> recent potassiums 3.1-3.6) - f/u BMET on Friday - f/u Monday 12/10 as planned, please call if weight does not begin to decrease or symptoms do not improve  Dayna Dunn PA-C

## 2017-02-06 NOTE — Telephone Encounter (Signed)
Informed patient of results and verbal understanding expressed.  Instructed patient to take Lasix 40 mg now. Instructed him to take Lasix 40 mg tomorrow morning and 20 mg in the evening. Instructed him to start Lasix 40 mg daily on Thursday. Instructed patient to START KCL 10 meq daily. Reiterated to him the important of limiting fluids to 64 oz daily and to limit salt. BMET scheduled Friday. He will call prior to appointment next week if weight does not decrease or if symptoms do not improve. He was grateful for call and agrees with treatment plan.

## 2017-02-07 DIAGNOSIS — I441 Atrioventricular block, second degree: Secondary | ICD-10-CM | POA: Diagnosis not present

## 2017-02-07 DIAGNOSIS — Z48812 Encounter for surgical aftercare following surgery on the circulatory system: Secondary | ICD-10-CM | POA: Diagnosis not present

## 2017-02-07 DIAGNOSIS — I130 Hypertensive heart and chronic kidney disease with heart failure and stage 1 through stage 4 chronic kidney disease, or unspecified chronic kidney disease: Secondary | ICD-10-CM | POA: Diagnosis not present

## 2017-02-07 DIAGNOSIS — E1151 Type 2 diabetes mellitus with diabetic peripheral angiopathy without gangrene: Secondary | ICD-10-CM | POA: Diagnosis not present

## 2017-02-07 DIAGNOSIS — I5033 Acute on chronic diastolic (congestive) heart failure: Secondary | ICD-10-CM | POA: Diagnosis not present

## 2017-02-07 DIAGNOSIS — E1122 Type 2 diabetes mellitus with diabetic chronic kidney disease: Secondary | ICD-10-CM | POA: Diagnosis not present

## 2017-02-09 DIAGNOSIS — E1122 Type 2 diabetes mellitus with diabetic chronic kidney disease: Secondary | ICD-10-CM | POA: Diagnosis not present

## 2017-02-09 DIAGNOSIS — E1151 Type 2 diabetes mellitus with diabetic peripheral angiopathy without gangrene: Secondary | ICD-10-CM | POA: Diagnosis not present

## 2017-02-09 DIAGNOSIS — I441 Atrioventricular block, second degree: Secondary | ICD-10-CM | POA: Diagnosis not present

## 2017-02-09 DIAGNOSIS — I130 Hypertensive heart and chronic kidney disease with heart failure and stage 1 through stage 4 chronic kidney disease, or unspecified chronic kidney disease: Secondary | ICD-10-CM | POA: Diagnosis not present

## 2017-02-09 DIAGNOSIS — I5033 Acute on chronic diastolic (congestive) heart failure: Secondary | ICD-10-CM | POA: Diagnosis not present

## 2017-02-09 DIAGNOSIS — I10 Essential (primary) hypertension: Secondary | ICD-10-CM | POA: Diagnosis not present

## 2017-02-09 DIAGNOSIS — E118 Type 2 diabetes mellitus with unspecified complications: Secondary | ICD-10-CM | POA: Diagnosis not present

## 2017-02-09 DIAGNOSIS — Z48812 Encounter for surgical aftercare following surgery on the circulatory system: Secondary | ICD-10-CM | POA: Diagnosis not present

## 2017-02-12 ENCOUNTER — Other Ambulatory Visit: Payer: Federal, State, Local not specified - PPO

## 2017-02-12 ENCOUNTER — Ambulatory Visit: Payer: Federal, State, Local not specified - PPO | Admitting: Physician Assistant

## 2017-02-13 ENCOUNTER — Ambulatory Visit: Payer: Federal, State, Local not specified - PPO | Admitting: Physician Assistant

## 2017-02-13 ENCOUNTER — Other Ambulatory Visit: Payer: Federal, State, Local not specified - PPO

## 2017-02-14 DIAGNOSIS — I509 Heart failure, unspecified: Secondary | ICD-10-CM | POA: Diagnosis not present

## 2017-02-14 DIAGNOSIS — E119 Type 2 diabetes mellitus without complications: Secondary | ICD-10-CM | POA: Diagnosis not present

## 2017-02-14 DIAGNOSIS — E7800 Pure hypercholesterolemia, unspecified: Secondary | ICD-10-CM | POA: Diagnosis not present

## 2017-02-14 DIAGNOSIS — Z23 Encounter for immunization: Secondary | ICD-10-CM | POA: Diagnosis not present

## 2017-02-15 ENCOUNTER — Telehealth: Payer: Self-pay | Admitting: Cardiology

## 2017-02-15 NOTE — Telephone Encounter (Signed)
Spoke with the pt and informed him that he will continue being a Dr Meda Coffee pt, but we had to get him in with an APP this week, for his post-hospital follow-up appt from Tuesday was cancelled, due to inclement weather. Informed the pt that being the clinic was closed 2 days, we are doing our very best to accommodate pts whom need to be seen urgently, this week.  Informed the pt that he is one we need to see this week, and the availability was at [facility] with Rosaria Ferries NP.  Advised the pt to keep his appt with Suanne Marker for 12/14, for continuity of care.  Pt verbalized understanding and agrees with this plan.  Pt gracious for all the assistance provided.

## 2017-02-15 NOTE — Telephone Encounter (Signed)
New Message     Patient would like to speak to Dr Meda Coffee about his personal health, he is not happy with being transferred to the [facility] office

## 2017-02-16 ENCOUNTER — Encounter: Payer: Self-pay | Admitting: Physician Assistant

## 2017-02-16 ENCOUNTER — Ambulatory Visit (INDEPENDENT_AMBULATORY_CARE_PROVIDER_SITE_OTHER): Payer: Medicare Other | Admitting: Physician Assistant

## 2017-02-16 VITALS — BP 109/54 | HR 82 | Ht 70.0 in | Wt 206.0 lb

## 2017-02-16 DIAGNOSIS — E876 Hypokalemia: Secondary | ICD-10-CM | POA: Diagnosis not present

## 2017-02-16 DIAGNOSIS — N183 Chronic kidney disease, stage 3 unspecified: Secondary | ICD-10-CM

## 2017-02-16 DIAGNOSIS — I10 Essential (primary) hypertension: Secondary | ICD-10-CM

## 2017-02-16 DIAGNOSIS — I5042 Chronic combined systolic (congestive) and diastolic (congestive) heart failure: Secondary | ICD-10-CM | POA: Diagnosis not present

## 2017-02-16 DIAGNOSIS — I2510 Atherosclerotic heart disease of native coronary artery without angina pectoris: Secondary | ICD-10-CM

## 2017-02-16 DIAGNOSIS — Z79899 Other long term (current) drug therapy: Secondary | ICD-10-CM | POA: Diagnosis not present

## 2017-02-16 NOTE — Patient Instructions (Signed)
Medication Instructions:  TAKE EXTRA POTASSIUM TODAY ONLY  If you need a refill on your cardiac medications before your next appointment, please call your pharmacy.  Labwork: BMET TODAY HERE IN OUR OFFICE AT [facility]  Special Instructions: MAKE APPT WITH DAYNA Mifflin 03-05-2017 @ 1130AM PER RHONDA  YOUR GOAL WEIGHT IS 205 POUNDS  PLEASE FOLLOW 2,000 MG LOW SODIUM DIET  FLUID RESTRICTION 1 LITER ALL FLUIDS  Follow-Up: Your physician wants you to follow-up in:  [place] 03-05-2017 @ 1130AM PER RHONDA   Thank you for choosing [facility] at [place]!!

## 2017-02-16 NOTE — Progress Notes (Signed)
Cardiology Office Note   Date:  02/16/2017   ID:  [name] [name], DOB [birth year], MRN [id number]  PCP:  Jani Gravel, MD  Cardiologist: Dr. Kristopher Glee, PA-C, 02/06/2017 Rosaria Ferries, PA-C   Chief Complaint  Patient presents with  . Follow-up    pt c/o SOb    History of Present Illness: [name] [name] is a 83 y.o. male with a history of CABG in 2005 x 4, PAD s/p aorto-bifem bypass, chronic combined CHF, longstanding bradycardia>>CHB s/p Medtronic PPM 09/2016, COPD, CKD stage IV, chronic anemia, AAA repair 2014, carotid artery disease, stroke, DM, dyslipidemia, fall/hip fx, HTN, tobacco abuse  Admitted 11/19-11/21 with CHF 12/04 office visit, EF had worsened but patient too frail and weak to do invasive testing, Imdur 30 mg added, ambulatory O2 sats needed to determine home O2, pulmonary referral made, early follow-up recommended, Lasix 20 mg tabs, Lasix 60 mg x 2 days, then 40 mg qd once labs reviewed. BUN/Cr 31/2.53. 12/07 lab work by his PCP showed BUN 32, creatinine 3.1, potassium 3.3  [name] [name] presents for cardiology follow up.  He has lost 3 lbs, is breathing a little better. LE edema has improved. No PND, but has chronic orthopnea with 2 pillows, but slides off them during the night. He sits upright during the day. No compression socks or support socks.  Still getting some daytime edema, but not as much.  They do not eat out much. She is using [employer] and Mrs Deliah [place], trying to cook with less salt.   His weight on his scales was 205 lbs today.   He sees Dr Arty Baumgartner every 3-4 months, has appt in 2 weeks or so.   His main issue is dyspnea on exertion.  He has right-sided weakness since his stroke.  He has to work hard to walk and gets short of breath very quickly.  He does not go out much because of his difficulties with ambulation.  He has a lot of frustration, dealing with his worsening physical condition.  He uses a walker at home.   Past Medical History:    Diagnosis Date  . AAA (abdominal aortic aneurysm) [facility])    Surgical repair June, 2014  . CAD (coronary artery disease)    a. s/p CABG in 2005 x 4. b. low risk nuc 2016.  . Carotid artery occlusion   . Chronic anemia   . Chronic combined systolic and diastolic CHF (congestive heart failure) ([place])   . Chronic renal insufficiency   . CKD (chronic kidney disease), stage III ([place])   . Complete heart block ([place])   . COPD (chronic obstructive pulmonary disease) ([place]) 08/15/2012  . CVA (cerebral vascular accident) ([place]) ~ 2003   residual R leg weakness (01/22/2017)  . Dyslipidemia   . Fall at home 04/09/2013   Fx Right Hip  . HTN (hypertension)   . Hx of CABG    2005  . Morbid obesity ([place])   . Myocardial infarction [facility])    "was told I had one before I had my heart bypass" (01/22/2017)  . Neuropathy    Right foot  . Peripheral vascular disease ([place])    a. s/p aorto-bifem bypass.  . Presence of permanent cardiac pacemaker   . S/P placement of cardiac pacemaker    a. Medtronic 09/2016.  . Status post abdominal aortic aneurysm repair    June, 2014  . Tobacco abuse    quit 2014  . Type II diabetes mellitus ([place])  Past Surgical History:  Procedure Laterality Date  . ABDOMINAL AORTAGRAM N/A 08/07/2012   Procedure: ABDOMINAL AORTAGRAM;  Surgeon: Rosetta Posner, MD;  Location: [facility] CATH LAB;  Service: Cardiovascular;  Laterality: N/A;  . AORTA - BILATERAL FEMORAL ARTERY BYPASS GRAFT N/A 08/09/2012   Procedure: AORTA BIFEMORAL BYPASS GRAFT;  Surgeon: Rosetta Posner, MD;  Location: [place];  Service: Vascular;  Laterality: N/A;  . CORONARY ARTERY BYPASS GRAFT  2005   CABG X"5"  . HIP ARTHROPLASTY Right 04/09/2013   Procedure: ARTHROPLASTY BIPOLAR HIP;  Surgeon: Gearlean Alf, MD;  [facility]: [facility];  Service: Orthopedics;  Laterality: Right;  . INSERT / REPLACE / REMOVE PACEMAKER    . JOINT REPLACEMENT    . LUMBAR DISC SURGERY  1977   Lumbar HNP surgery  . PACEMAKER IMPLANT N/A 09/11/2016    Procedure: Pacemaker Implant;  Surgeon: Deboraha Sprang, MD;  Location: [place] CV LAB;  Service: Cardiovascular;  Laterality: N/A;  . TONSILLECTOMY  1940    Current Outpatient Medications  Medication Sig Dispense Refill  . albuterol (PROVENTIL) (2.5 MG/3ML) 0.083% nebulizer solution Take 3 mLs (2.5 mg total) by nebulization every 4 (four) hours as needed for wheezing or shortness of breath. 75 mL 12  . ANORO ELLIPTA 62.5-25 MCG/INH AEPB Inhale 1 puff into the lungs every morning.     Marland Kitchen aspirin 81 MG tablet Take 81 mg by mouth every morning.     Marland Kitchen atorvastatin (LIPITOR) 40 MG tablet Take 40 mg by mouth every morning.     . cholecalciferol (VITAMIN D) 1000 UNITS tablet Take 1,000 Units by mouth every morning.     . escitalopram (LEXAPRO) 5 MG tablet Take 5 mg by mouth every evening.     . furosemide (LASIX) 40 MG tablet Take 1 tablet (40 mg total) by mouth daily. 30 tablet 11  . gabapentin (NEURONTIN) 300 MG capsule Take 300 mg by mouth every morning.     Marland Kitchen glucose monitoring kit (FREESTYLE) monitoring kit 1 each as needed by Does not apply route for other.    . hydrALAZINE (APRESOLINE) 50 MG tablet Take 1 tablet (50 mg total) by mouth 3 (three) times daily. 90 tablet 6  . isosorbide mononitrate (IMDUR) 30 MG 24 hr tablet Take 1 tablet (30 mg total) by mouth daily. 90 tablet 3  . Melatonin 5 MG TABS Take 1 tablet by mouth at bedtime.    . montelukast (SINGULAIR) 10 MG tablet Take 10 mg by mouth every evening.     Marland Kitchen oxybutynin (DITROPAN-XL) 5 MG 24 hr tablet Take 5 mg by mouth at bedtime.     . potassium chloride (K-DUR) 10 MEQ tablet Take 1 tablet (10 mEq total) by mouth daily. 30 tablet 11  . PROAIR HFA 108 (90 Base) MCG/ACT inhaler Inhale 2 puffs into the lungs every 4 (four) hours as needed for wheezing or shortness of breath. 1 Inhaler 6  . TRADJENTA 5 MG TABS tablet Take 5 mg by mouth every morning.    . traMADol (ULTRAM) 50 MG tablet Take 1 tablet by mouth every 6 (six) hours as  needed for moderate pain.     . vitamin B-12 (CYANOCOBALAMIN) 100 MCG tablet Take 100 mcg by mouth every morning.     . zolpidem (AMBIEN) 10 MG tablet Take 5 mg by mouth at bedtime.      No current facility-administered medications for this visit.     Allergies:   Patient has no known allergies.  Social History:  The patient  reports that he quit smoking about 4 years ago. His smoking use included cigarettes. He has a 25.00 pack-year smoking history. he has never used smokeless tobacco. He reports that he drinks alcohol. He reports that he does not use drugs.   Family History:  The patient's family history includes Coronary artery disease in his father; Tuberculosis (age of onset: 9) in his mother; Varicose Veins in his father.    ROS:  Please see the history of present illness. All other systems are reviewed and negative.    PHYSICAL EXAM: VS:  BP (!) 109/54 (BP Location: Left Arm, Patient Position: Sitting, Cuff Size: Normal)   Pulse 82   Ht _0  (1.778 m)   Wt 206 lb (93.4 kg)   BMI 29.56 kg/m  , BMI Body mass index is 29.56 kg/m. GEN: Well nourished, well developed, male in no acute distress  HEENT: normal for age  Neck: JVD 8 cm, no carotid bruit, no masses Cardiac: RRR; no murmur, no rubs, or gallops Respiratory: Decreased breath sounds bilaterally, more so in the bases, increased work of breathing with exertion GI: soft, nontender, nondistended, + BS MS: no deformity or atrophy; trace edema; distal pulses are 2+ in all 4 extremities   Skin: warm and dry, no rash Neuro:  Strength and sensation are intact Psych: euthymic mood, full affect   EKG:  EKG is not ordered today.  ECHO: 01/23/2017 - Left ventricle: The cavity size was normal. Wall thickness was   normal. Systolic function was mildly reduced. The estimated   ejection fraction was in the range of 45% to 50%. Hypokinesis of   the basal-midinferolateral and inferior myocardium; consistent   with ischemia or  infarction in the distribution of the right   coronary artery. Features are consistent with a pseudonormal left   ventricular filling pattern, with concomitant abnormal relaxation   and increased filling pressure (grade 2 diastolic dysfunction). - Ventricular septum: Septal motion showed abnormal function,   dyssynergy, and paradox. These changes are consistent with right   ventricular pacing. - Mitral valve: There was mild to moderate regurgitation directed   centrally. - Left atrium: The atrium was moderately dilated. - Pulmonary arteries: PA peak pressure: 32 mm Hg (S).   Recent Labs: 09/08/2016: Magnesium 2.0 01/22/2017: ALT 15; B Natriuretic Peptide 811.1; Hemoglobin 10.4; Platelets 185; TSH 2.997 02/06/2017: BUN 31; Creatinine, Ser 2.53; NT-Pro BNP 2,488; Potassium 3.8; Sodium 142    Lipid Panel No results found for: CHOL, TRIG, HDL, CHOLHDL, VLDL, LDLCALC, LDLDIRECT   Wt Readings from Last 3 Encounters:  02/16/17 206 lb (93.4 kg)  02/06/17 209 lb (94.8 kg)  01/24/17 204 lb 11.2 oz (92.9 kg)     Other studies Reviewed: Additional studies/ records that were reviewed today include: Office notes, hospital records and testing.  ASSESSMENT AND PLAN:  1.  Chronic combined systolic and diastolic CHF: At the time his echo was done on 11/20, his PAS was 32.  On 11/21, his weight was 204.  His weight today on his home scales was 205.  We will consider this is dry weight.  He is to continue current Lasix dosing.  The importance of sticking to a 2000 mg/day sodium diet was emphasized.  He and his wife seemed to understand the connection between sodium and weight gain a little better.   He ambulated with staff in the office.  His oxygen saturation remained 95% with greater than 100 feet of ambulation.  He did get very short of breath with this.  I emphasized that although he is short of breath, his oxygen saturation did not drop.  Therefore, it is okay for him to push himself a little bit  to increase his activity level.  It is understandable that he gets short of breath with ambulation because he has, not only deconditioning, but chronic right-sided weakness from his stroke.  2.  Hypertension: His blood pressure is well controlled.  His heart rate is normal.  3.  CKD 3: Repeat a BMET today, follow-up with Dr. Arty Baumgartner as scheduled.  4.  Hypokalemia: His potassium was minimally low a week ago.  He is advised to take 1 extra potassium tablet today.   Current medicines are reviewed at length with the patient today.  The patient does not have concerns regarding medicines.  The following changes have been made:  no change  Labs/ tests ordered today include:   Orders Placed This Encounter  Procedures  . Basic metabolic panel     Disposition:   FU with Dr. Meda Coffee  Signed, Rosaria Ferries, PA-C  02/16/2017 2:00 PM    [place] Phone: [phone]; Fax: [phone]  This note was written with the assistance of speech recognition software. Please excuse any transcriptional errors.

## 2017-02-17 LAB — BASIC METABOLIC PANEL
BUN / CREAT RATIO: 11 (ref 10–24)
BUN: 28 mg/dL — ABNORMAL HIGH (ref 8–27)
CHLORIDE: 103 mmol/L (ref 96–106)
CO2: 25 mmol/L (ref 20–29)
CREATININE: 2.44 mg/dL — AB (ref 0.76–1.27)
Calcium: 9.1 mg/dL (ref 8.6–10.2)
GFR calc Af Amer: 27 mL/min/{1.73_m2} — ABNORMAL LOW (ref >59)
GFR calc non Af Amer: 24 mL/min/{1.73_m2} — ABNORMAL LOW (ref >59)
GLUCOSE: 92 mg/dL (ref 65–99)
POTASSIUM: 4.7 mmol/L (ref 3.5–5.2)
SODIUM: 142 mmol/L (ref 134–144)

## 2017-02-19 DIAGNOSIS — Z48812 Encounter for surgical aftercare following surgery on the circulatory system: Secondary | ICD-10-CM | POA: Diagnosis not present

## 2017-02-19 DIAGNOSIS — E1151 Type 2 diabetes mellitus with diabetic peripheral angiopathy without gangrene: Secondary | ICD-10-CM | POA: Diagnosis not present

## 2017-02-19 DIAGNOSIS — I441 Atrioventricular block, second degree: Secondary | ICD-10-CM | POA: Diagnosis not present

## 2017-02-19 DIAGNOSIS — I130 Hypertensive heart and chronic kidney disease with heart failure and stage 1 through stage 4 chronic kidney disease, or unspecified chronic kidney disease: Secondary | ICD-10-CM | POA: Diagnosis not present

## 2017-02-19 DIAGNOSIS — E1122 Type 2 diabetes mellitus with diabetic chronic kidney disease: Secondary | ICD-10-CM | POA: Diagnosis not present

## 2017-02-19 DIAGNOSIS — I5033 Acute on chronic diastolic (congestive) heart failure: Secondary | ICD-10-CM | POA: Diagnosis not present

## 2017-02-21 ENCOUNTER — Encounter: Payer: Self-pay | Admitting: Internal Medicine

## 2017-02-21 ENCOUNTER — Ambulatory Visit (INDEPENDENT_AMBULATORY_CARE_PROVIDER_SITE_OTHER): Payer: Medicare Other | Admitting: Internal Medicine

## 2017-02-21 VITALS — BP 122/76 | HR 80 | Ht 70.0 in | Wt 202.6 lb

## 2017-02-21 DIAGNOSIS — R5382 Chronic fatigue, unspecified: Secondary | ICD-10-CM | POA: Diagnosis not present

## 2017-02-21 DIAGNOSIS — J449 Chronic obstructive pulmonary disease, unspecified: Secondary | ICD-10-CM | POA: Diagnosis not present

## 2017-02-21 DIAGNOSIS — I2510 Atherosclerotic heart disease of native coronary artery without angina pectoris: Secondary | ICD-10-CM

## 2017-02-21 DIAGNOSIS — R5381 Other malaise: Secondary | ICD-10-CM | POA: Diagnosis not present

## 2017-02-21 DIAGNOSIS — R0602 Shortness of breath: Secondary | ICD-10-CM | POA: Diagnosis not present

## 2017-02-21 MED ORDER — MOMETASONE FUROATE 100 MCG/ACT IN AERO: 2.0000 | INHALATION_SPRAY | Freq: Every day | RESPIRATORY_TRACT | 0 refills | Status: DC

## 2017-02-21 MED ORDER — MOMETASONE FUROATE 100 MCG/ACT IN AERO: 2.0000 | INHALATION_SPRAY | Freq: Every day | RESPIRATORY_TRACT | 5 refills | Status: DC

## 2017-02-21 NOTE — Progress Notes (Signed)
Subjective:    Patient ID: [name] [name], male    DOB: Jul 23, 1931, 83 y.o.   MRN: [id number]  PCP Jani Gravel, MD   HPI  IOV 02/21/2017  Chief Complaint  Patient presents with  . Advice Only    Referred by CVD for COPD.  Pt stated he had been having a hard time breathing which has become better, and also has been diagnosed with CHF. States that he coughs occ. and does have occ. CP.    83 year old male accompanied by his wife.  He has been referred by cardiology for evaluation of shortness of breath that might be able to proportion to his chronic systolic heart failure.  This concerned that he has COPD.  He is on dual long-acting beta agonist and anticholinergic with only some relief.  He feels extremely short of breath and fatigue but only with some cough.  He says he wants to qualify for oxygen.  It appears that with his diagnosis of cardiac issues he has had multiple admissions in 2018 that of left and overall fatigue.  He had a stroke many years ago therefore he cannot do pulmonary or cardiac rehabilitation but he can get around with a walker.  His COPD CAT score is positive at 26 points with most of the symptoms of fatigue and poor quality of life and shortness of breath.  Wife does admit that he has a low mood and is in the indoors all the time in the winter watching TV.  Prior chest x-rays do show some hyperinflation but there also confounded by pulmonary congestion.  I personally visualized the chest x-rays.  He does not have a CT scan in the past.  He has a strong prior history of smoking    CAT COPD Symptom & Quality of Life Score ([place] trademark) 0 is no burden. 5 is highest burden 02/21/2017   Never Cough -> Cough all the time 1  No phlegm in chest -> Chest is full of phlegm 3  No chest tightness -> Chest feels very tight 1  No dyspnea for 1 flight stairs/hill -> Very dyspneic for 1 flight of stairs 5  No limitations for ADL at home -> Very limited with ADL at home 5    Confident leaving home -> Not at all confident leaving home 3  Sleep soundly -> Do not sleep soundly because of lung condition 3  Lots of Energy -> No energy at all 5  TOTAL Score (max 40)  26       has a past medical history of AAA (abdominal aortic aneurysm) ([place]), CAD (coronary artery disease), Carotid artery occlusion, Chronic anemia, Chronic combined systolic and diastolic CHF (congestive heart failure) ([place]), Chronic renal insufficiency, CKD (chronic kidney disease), stage III ([place]), Complete heart block ([place]), COPD (chronic obstructive pulmonary disease) ([place]) (08/15/2012), CVA (cerebral vascular accident) ([place]) (~ 2003), Dyslipidemia, Fall at home (04/09/2013), HTN (hypertension), CABG, Morbid obesity ([place]), Myocardial infarction ([place]), Neuropathy, Peripheral vascular disease ([place]), Presence of permanent cardiac pacemaker, S/P placement of cardiac pacemaker, Status post abdominal aortic aneurysm repair, Tobacco abuse, and Type II diabetes mellitus ([place]).   reports that he quit smoking about 4 years ago. His smoking use included cigarettes. He has a 50.00 pack-year smoking history. he has never used smokeless tobacco.  Past Surgical History:  Procedure Laterality Date  . ABDOMINAL AORTAGRAM N/A 08/07/2012   Procedure: ABDOMINAL AORTAGRAM;  Surgeon: Rosetta Posner, MD;  Location[facility] CATH LAB;  Service: Cardiovascular;  Laterality:  N/A;  . AORTA - BILATERAL FEMORAL ARTERY BYPASS GRAFT N/A 08/09/2012   Procedure: AORTA BIFEMORAL BYPASS GRAFT;  Surgeon: Rosetta Posner, MD;  Location: [facility];  Service: Vascular;  Laterality: N/A;  . CORONARY ARTERY BYPASS GRAFT  2005   CABG X"5"  . HIP ARTHROPLASTY Right 04/09/2013   Procedure: ARTHROPLASTY BIPOLAR HIP;  Surgeon: Gearlean Alf, MD;  [facility];  Service: Orthopedics;  Laterality: Right;  . INSERT / REPLACE / REMOVE PACEMAKER    . JOINT REPLACEMENT    . LUMBAR DISC SURGERY  1977   Lumbar HNP surgery  . PACEMAKER IMPLANT N/A 09/11/2016    Procedure: Pacemaker Implant;  Surgeon: Deboraha [place]ang, MD;  Location: [place] CV LAB;  Service: Cardiovascular;  Laterality: N/A;  . TONSILLECTOMY  1940    No Known Allergies  Immunization History  Administered Date(s) Administered  . Influenza, High Dose Seasonal PF 01/18/2017    Family History  Problem Relation Age of Onset  . Tuberculosis Mother 79  . Coronary artery disease Father   . Varicose Veins Father      Current Outpatient Medications:  .  ANORO ELLIPTA 62.5-25 MCG/INH AEPB, Inhale 1 puff into the lungs every morning. , Disp: , Rfl:  .  aspirin 81 MG tablet, Take 81 mg by mouth every morning. , Disp: , Rfl:  .  atorvastatin (LIPITOR) 40 MG tablet, Take 40 mg by mouth every morning. , Disp: , Rfl:  .  cholecalciferol (VITAMIN D) 1000 UNITS tablet, Take 1,000 Units by mouth every morning. , Disp: , Rfl:  .  escitalopram (LEXAPRO) 5 MG tablet, Take 5 mg by mouth every evening. , Disp: , Rfl:  .  furosemide (LASIX) 40 MG tablet, Take 1 tablet (40 mg total) by mouth daily., Disp: 30 tablet, Rfl: 11 .  gabapentin (NEURONTIN) 300 MG capsule, Take 300 mg by mouth every morning. , Disp: , Rfl:  .  glucose monitoring kit (FREESTYLE) monitoring kit, 1 each as needed by Does not apply route for other., Disp: , Rfl:  .  hydrALAZINE (APRESOLINE) 50 MG tablet, Take 1 tablet (50 mg total) by mouth 3 (three) times daily., Disp: 90 tablet, Rfl: 6 .  isosorbide mononitrate (IMDUR) 30 MG 24 hr tablet, Take 1 tablet (30 mg total) by mouth daily., Disp: 90 tablet, Rfl: 3 .  Melatonin 5 MG TABS, Take 1 tablet by mouth at bedtime., Disp: , Rfl:  .  montelukast (SINGULAIR) 10 MG tablet, Take 10 mg by mouth every evening. , Disp: , Rfl:  .  oxybutynin (DITROPAN-XL) 5 MG 24 hr tablet, Take 5 mg by mouth at bedtime. , Disp: , Rfl:  .  potassium chloride (K-DUR) 10 MEQ tablet, Take 1 tablet (10 mEq total) by mouth daily., Disp: 30 tablet, Rfl: 11 .  PROAIR HFA 108 (90 Base) MCG/ACT inhaler,  Inhale 2 puffs into the lungs every 4 (four) hours as needed for wheezing or shortness of breath., Disp: 1 Inhaler, Rfl: 6 .  TRADJENTA 5 MG TABS tablet, Take 5 mg by mouth every morning., Disp: , Rfl:  .  traMADol (ULTRAM) 50 MG tablet, Take 1 tablet by mouth every 6 (six) hours as needed for moderate pain. , Disp: , Rfl:  .  vitamin B-12 (CYANOCOBALAMIN) 100 MCG tablet, Take 100 mcg by mouth every morning. , Disp: , Rfl:  .  zolpidem (AMBIEN) 10 MG tablet, Take 5 mg by mouth at bedtime. , Disp: , Rfl:  .  albuterol (PROVENTIL) (2.5 MG/3ML) 0.083% nebulizer solution, Take 3 mLs (2.5 mg total) by nebulization every 4 (four) hours as needed for wheezing or shortness of breath., Disp: 75 mL, Rfl: 12   Review of Systems  Constitutional: Positive for unexpected weight change. Negative for fever.  HENT: Positive for congestion and trouble swallowing. Negative for dental problem, ear pain, nosebleeds, postnasal drip, rhinorrhea, sinus pressure, sneezing and sore throat.   Eyes: Negative for redness and itching.  Respiratory: Positive for cough, chest tightness, shortness of breath and wheezing.   Cardiovascular: Positive for leg swelling. Negative for palpitations.  Gastrointestinal: Negative for nausea and vomiting.  Genitourinary: Negative for dysuria.  Musculoskeletal: Negative for joint swelling.  Skin: Negative for rash.  Allergic/Immunologic: Negative.  Negative for environmental allergies, food allergies and immunocompromised state.  Neurological: Negative for headaches.  Hematological: Bruises/bleeds easily.  Psychiatric/Behavioral: Negative for dysphoric mood. The patient is not nervous/anxious.        Objective:   Physical Exam  Vitals:   02/21/17 1530  BP: 122/76  Pulse: 80  SpO2: 96%  Weight: 202 lb 9.6 oz (91.9 kg)  Height: 5' 10"  (1.778 m)    Estimated body mass index is 29.07 kg/m as calculated from the following:   Height as of this encounter: 5' 10"  (1.778 m).    Weight as of this encounter: 202 lb 9.6 oz (91.9 kg).   General Appearance:    Looks chronically unwell. [place]. Deconditioned  Head:    Normocephalic, without obvious abnormality, atraumatic  Eyes:    PERRL - yes, conjunctiva/corneas - clear      Ears:    Normal external ear canals, both ears  Nose:   NG tube - no. No o2 either  Throat:  ETT TUBE - no , OG tube - no  Neck:   Supple,  No enlargement/tenderness/nodules     Lungs:     Clear to auscultation bilaterally,   Chest wall:    No deformity  Heart:    S1 and S2 normal, no murmur, CVP - no.  Pressors - no  Abdomen:     Soft, no masses, no organomegaly  Genitalia:    Not done  Rectal:   not done  Extremities:   Extremities- intact. Has walker     Skin:   Intact in exposed areas      Neurologic:   Sedation - none -> RASS - +1 . Moves all 4s - yes. CAM-ICU - neg for delirium . Orientation - x3+         Assessment & Plan:     ICD-10-CM   1. Chronic obstructive pulmonary disease, unspecified COPD type ([place]) J44.9   2. Shortness of breath R06.02   3. Chronic fatigue R53.82   4. Physical deconditioning R53.81       Continue anoro Add inhaled steroid such as asmanex or pulmicort or arnuity daily Test ono on room air - if o2 drops then can order o2 for night Try Light Therapy for mood in [place]  In 2 months do Pre-bd spiro and dlco only. No lung volume or bd response. No post-bd spiro In 2 months do HRCT  Followup Dr Chase Caller in 22month or sooner if needed    Dr. MBrand Males M.D., FCorpus Christi Surgicare Ltd Dba Corpus Christi Outpatient Surgery CenterC.P Pulmonary and Critical Care Medicine Staff Physician, CSouth RidingDirector - Interstitial Lung Disease  Program  Pulmonary [place] [id number] Pager: [phone] If no answer  or between  15:00h - 7:00h: call [phone] Telephone: [phone]

## 2017-02-21 NOTE — Patient Instructions (Signed)
ICD-10-CM   1. Chronic obstructive pulmonary disease, unspecified COPD type ([place]) J44.9   2. Shortness of breath R06.02   3. Chronic fatigue R53.82   4. Physical deconditioning R53.81     Continue anoro Add inhaled steroid such as asmanex or pulmicort or arnuity daily Test ono on room air - if o2 drops then can order o2 for night Try Light Therapy for mood in [place]  In 2 months do Pre-bd spiro and dlco only. No lung volume or bd response. No post-bd spiro In 2 months do HRCT  Followup Dr Chase Caller in 79months or sooner if needed

## 2017-02-22 DIAGNOSIS — Z48812 Encounter for surgical aftercare following surgery on the circulatory system: Secondary | ICD-10-CM | POA: Diagnosis not present

## 2017-02-22 DIAGNOSIS — I441 Atrioventricular block, second degree: Secondary | ICD-10-CM | POA: Diagnosis not present

## 2017-02-22 DIAGNOSIS — I130 Hypertensive heart and chronic kidney disease with heart failure and stage 1 through stage 4 chronic kidney disease, or unspecified chronic kidney disease: Secondary | ICD-10-CM | POA: Diagnosis not present

## 2017-02-22 DIAGNOSIS — E1122 Type 2 diabetes mellitus with diabetic chronic kidney disease: Secondary | ICD-10-CM | POA: Diagnosis not present

## 2017-02-22 DIAGNOSIS — I5033 Acute on chronic diastolic (congestive) heart failure: Secondary | ICD-10-CM | POA: Diagnosis not present

## 2017-02-22 DIAGNOSIS — E1151 Type 2 diabetes mellitus with diabetic peripheral angiopathy without gangrene: Secondary | ICD-10-CM | POA: Diagnosis not present

## 2017-02-27 ENCOUNTER — Encounter (HOSPITAL_COMMUNITY): Payer: Self-pay

## 2017-02-27 ENCOUNTER — Emergency Department (HOSPITAL_COMMUNITY): Payer: Medicare Other

## 2017-02-27 ENCOUNTER — Inpatient Hospital Stay (HOSPITAL_COMMUNITY)
Admission: EM | Admit: 2017-02-27 | Discharge: 2017-03-08 | DRG: 871 | Disposition: A | Discharge status: Hospice | Payer: Medicare Other | Attending: Emergency Medicine | Admitting: Emergency Medicine

## 2017-02-27 ENCOUNTER — Other Ambulatory Visit: Payer: Self-pay

## 2017-02-27 DIAGNOSIS — Z96641 Presence of right artificial hip joint: Secondary | ICD-10-CM | POA: Diagnosis present

## 2017-02-27 DIAGNOSIS — R7881 Bacteremia: Secondary | ICD-10-CM | POA: Diagnosis not present

## 2017-02-27 DIAGNOSIS — N186 End stage renal disease: Secondary | ICD-10-CM | POA: Diagnosis not present

## 2017-02-27 DIAGNOSIS — J9690 Respiratory failure, unspecified, unspecified whether with hypoxia or hypercapnia: Secondary | ICD-10-CM

## 2017-02-27 DIAGNOSIS — R748 Abnormal levels of other serum enzymes: Secondary | ICD-10-CM | POA: Diagnosis not present

## 2017-02-27 DIAGNOSIS — Z6829 Body mass index (BMI) 29.0-29.9, adult: Secondary | ICD-10-CM

## 2017-02-27 DIAGNOSIS — Z87891 Personal history of nicotine dependence: Secondary | ICD-10-CM | POA: Diagnosis not present

## 2017-02-27 DIAGNOSIS — I6529 Occlusion and stenosis of unspecified carotid artery: Secondary | ICD-10-CM | POA: Diagnosis not present

## 2017-02-27 DIAGNOSIS — J9600 Acute respiratory failure, unspecified whether with hypoxia or hypercapnia: Secondary | ICD-10-CM | POA: Diagnosis not present

## 2017-02-27 DIAGNOSIS — I340 Nonrheumatic mitral (valve) insufficiency: Secondary | ICD-10-CM | POA: Diagnosis not present

## 2017-02-27 DIAGNOSIS — E1122 Type 2 diabetes mellitus with diabetic chronic kidney disease: Secondary | ICD-10-CM | POA: Diagnosis present

## 2017-02-27 DIAGNOSIS — E1151 Type 2 diabetes mellitus with diabetic peripheral angiopathy without gangrene: Secondary | ICD-10-CM | POA: Diagnosis present

## 2017-02-27 DIAGNOSIS — Z7984 Long term (current) use of oral hypoglycemic drugs: Secondary | ICD-10-CM

## 2017-02-27 DIAGNOSIS — N184 Chronic kidney disease, stage 4 (severe): Secondary | ICD-10-CM

## 2017-02-27 DIAGNOSIS — I739 Peripheral vascular disease, unspecified: Secondary | ICD-10-CM | POA: Diagnosis not present

## 2017-02-27 DIAGNOSIS — J441 Chronic obstructive pulmonary disease with (acute) exacerbation: Secondary | ICD-10-CM | POA: Diagnosis not present

## 2017-02-27 DIAGNOSIS — N183 Chronic kidney disease, stage 3 unspecified: Secondary | ICD-10-CM | POA: Diagnosis present

## 2017-02-27 DIAGNOSIS — Z66 Do not resuscitate: Secondary | ICD-10-CM

## 2017-02-27 DIAGNOSIS — A409 Streptococcal sepsis, unspecified: Secondary | ICD-10-CM | POA: Diagnosis not present

## 2017-02-27 DIAGNOSIS — N179 Acute kidney failure, unspecified: Secondary | ICD-10-CM | POA: Diagnosis not present

## 2017-02-27 DIAGNOSIS — E785 Hyperlipidemia, unspecified: Secondary | ICD-10-CM | POA: Diagnosis not present

## 2017-02-27 DIAGNOSIS — R531 Weakness: Secondary | ICD-10-CM

## 2017-02-27 DIAGNOSIS — N189 Chronic kidney disease, unspecified: Secondary | ICD-10-CM | POA: Diagnosis not present

## 2017-02-27 DIAGNOSIS — R0602 Shortness of breath: Secondary | ICD-10-CM

## 2017-02-27 DIAGNOSIS — I248 Other forms of acute ischemic heart disease: Secondary | ICD-10-CM | POA: Diagnosis present

## 2017-02-27 DIAGNOSIS — J189 Pneumonia, unspecified organism: Secondary | ICD-10-CM | POA: Diagnosis present

## 2017-02-27 DIAGNOSIS — J810 Acute pulmonary edema: Secondary | ICD-10-CM | POA: Diagnosis not present

## 2017-02-27 DIAGNOSIS — Z992 Dependence on renal dialysis: Secondary | ICD-10-CM | POA: Diagnosis not present

## 2017-02-27 DIAGNOSIS — I2489 Other forms of acute ischemic heart disease: Secondary | ICD-10-CM

## 2017-02-27 DIAGNOSIS — R0609 Other forms of dyspnea: Secondary | ICD-10-CM | POA: Diagnosis not present

## 2017-02-27 DIAGNOSIS — Z8673 Personal history of transient ischemic attack (TIA), and cerebral infarction without residual deficits: Secondary | ICD-10-CM

## 2017-02-27 DIAGNOSIS — E119 Type 2 diabetes mellitus without complications: Secondary | ICD-10-CM

## 2017-02-27 DIAGNOSIS — I10 Essential (primary) hypertension: Secondary | ICD-10-CM | POA: Diagnosis present

## 2017-02-27 DIAGNOSIS — I2510 Atherosclerotic heart disease of native coronary artery without angina pectoris: Secondary | ICD-10-CM | POA: Diagnosis not present

## 2017-02-27 DIAGNOSIS — J9601 Acute respiratory failure with hypoxia: Secondary | ICD-10-CM

## 2017-02-27 DIAGNOSIS — R6520 Severe sepsis without septic shock: Secondary | ICD-10-CM | POA: Diagnosis not present

## 2017-02-27 DIAGNOSIS — J13 Pneumonia due to Streptococcus pneumoniae: Secondary | ICD-10-CM | POA: Diagnosis present

## 2017-02-27 DIAGNOSIS — G629 Polyneuropathy, unspecified: Secondary | ICD-10-CM | POA: Diagnosis present

## 2017-02-27 DIAGNOSIS — I959 Hypotension, unspecified: Secondary | ICD-10-CM | POA: Diagnosis present

## 2017-02-27 DIAGNOSIS — I70209 Unspecified atherosclerosis of native arteries of extremities, unspecified extremity: Secondary | ICD-10-CM | POA: Diagnosis not present

## 2017-02-27 DIAGNOSIS — J90 Pleural effusion, not elsewhere classified: Secondary | ICD-10-CM | POA: Diagnosis not present

## 2017-02-27 DIAGNOSIS — J440 Chronic obstructive pulmonary disease with acute lower respiratory infection: Secondary | ICD-10-CM | POA: Diagnosis present

## 2017-02-27 DIAGNOSIS — I509 Heart failure, unspecified: Secondary | ICD-10-CM | POA: Diagnosis not present

## 2017-02-27 DIAGNOSIS — E871 Hypo-osmolality and hyponatremia: Secondary | ICD-10-CM | POA: Diagnosis present

## 2017-02-27 DIAGNOSIS — I679 Cerebrovascular disease, unspecified: Secondary | ICD-10-CM | POA: Diagnosis not present

## 2017-02-27 DIAGNOSIS — I495 Sick sinus syndrome: Secondary | ICD-10-CM | POA: Diagnosis not present

## 2017-02-27 DIAGNOSIS — J9621 Acute and chronic respiratory failure with hypoxia: Secondary | ICD-10-CM | POA: Diagnosis present

## 2017-02-27 DIAGNOSIS — R778 Other specified abnormalities of plasma proteins: Secondary | ICD-10-CM | POA: Diagnosis present

## 2017-02-27 DIAGNOSIS — B953 Streptococcus pneumoniae as the cause of diseases classified elsewhere: Secondary | ICD-10-CM | POA: Diagnosis present

## 2017-02-27 DIAGNOSIS — E872 Acidosis, unspecified: Secondary | ICD-10-CM | POA: Diagnosis present

## 2017-02-27 DIAGNOSIS — I130 Hypertensive heart and chronic kidney disease with heart failure and stage 1 through stage 4 chronic kidney disease, or unspecified chronic kidney disease: Secondary | ICD-10-CM | POA: Diagnosis present

## 2017-02-27 DIAGNOSIS — J449 Chronic obstructive pulmonary disease, unspecified: Secondary | ICD-10-CM | POA: Diagnosis not present

## 2017-02-27 DIAGNOSIS — Z515 Encounter for palliative care: Secondary | ICD-10-CM

## 2017-02-27 DIAGNOSIS — Z22322 Carrier or suspected carrier of Methicillin resistant Staphylococcus aureus: Secondary | ICD-10-CM | POA: Diagnosis not present

## 2017-02-27 DIAGNOSIS — F329 Major depressive disorder, single episode, unspecified: Secondary | ICD-10-CM | POA: Diagnosis present

## 2017-02-27 DIAGNOSIS — R1310 Dysphagia, unspecified: Secondary | ICD-10-CM | POA: Diagnosis not present

## 2017-02-27 DIAGNOSIS — A419 Sepsis, unspecified organism: Secondary | ICD-10-CM | POA: Diagnosis not present

## 2017-02-27 DIAGNOSIS — I25700 Atherosclerosis of coronary artery bypass graft(s), unspecified, with unstable angina pectoris: Secondary | ICD-10-CM | POA: Diagnosis not present

## 2017-02-27 DIAGNOSIS — I5043 Acute on chronic combined systolic (congestive) and diastolic (congestive) heart failure: Secondary | ICD-10-CM | POA: Diagnosis not present

## 2017-02-27 DIAGNOSIS — I471 Supraventricular tachycardia: Secondary | ICD-10-CM | POA: Diagnosis present

## 2017-02-27 DIAGNOSIS — Z79899 Other long term (current) drug therapy: Secondary | ICD-10-CM

## 2017-02-27 DIAGNOSIS — E1159 Type 2 diabetes mellitus with other circulatory complications: Secondary | ICD-10-CM | POA: Diagnosis not present

## 2017-02-27 DIAGNOSIS — E669 Obesity, unspecified: Secondary | ICD-10-CM | POA: Diagnosis present

## 2017-02-27 DIAGNOSIS — Z7982 Long term (current) use of aspirin: Secondary | ICD-10-CM

## 2017-02-27 DIAGNOSIS — Z452 Encounter for adjustment and management of vascular access device: Secondary | ICD-10-CM

## 2017-02-27 DIAGNOSIS — R079 Chest pain, unspecified: Secondary | ICD-10-CM | POA: Diagnosis not present

## 2017-02-27 DIAGNOSIS — R7989 Other specified abnormal findings of blood chemistry: Secondary | ICD-10-CM | POA: Diagnosis present

## 2017-02-27 DIAGNOSIS — Z950 Presence of cardiac pacemaker: Secondary | ICD-10-CM | POA: Diagnosis not present

## 2017-02-27 DIAGNOSIS — D631 Anemia in chronic kidney disease: Secondary | ICD-10-CM

## 2017-02-27 DIAGNOSIS — I252 Old myocardial infarction: Secondary | ICD-10-CM

## 2017-02-27 DIAGNOSIS — R0600 Dyspnea, unspecified: Secondary | ICD-10-CM | POA: Diagnosis present

## 2017-02-27 DIAGNOSIS — R509 Fever, unspecified: Secondary | ICD-10-CM | POA: Diagnosis not present

## 2017-02-27 DIAGNOSIS — I25810 Atherosclerosis of coronary artery bypass graft(s) without angina pectoris: Secondary | ICD-10-CM | POA: Diagnosis not present

## 2017-02-27 DIAGNOSIS — Z794 Long term (current) use of insulin: Secondary | ICD-10-CM | POA: Diagnosis not present

## 2017-02-27 DIAGNOSIS — R0902 Hypoxemia: Secondary | ICD-10-CM | POA: Diagnosis not present

## 2017-02-27 DIAGNOSIS — Z951 Presence of aortocoronary bypass graft: Secondary | ICD-10-CM

## 2017-02-27 HISTORY — DX: Other retention of urine: R33.8

## 2017-02-27 HISTORY — DX: Carrier or suspected carrier of methicillin resistant Staphylococcus aureus: Z22.322

## 2017-02-27 HISTORY — DX: Fracture of unspecified part of neck of right femur, initial encounter for closed fracture: S72.001A

## 2017-02-27 LAB — I-STAT TROPONIN, ED: Troponin i, poc: 0.17 ng/mL (ref 0.00–0.08)

## 2017-02-27 LAB — COMPREHENSIVE METABOLIC PANEL
ALT: 12 U/L — ABNORMAL LOW (ref 17–63)
ANION GAP: 11 (ref 5–15)
AST: 27 U/L (ref 15–41)
Albumin: 3.3 g/dL — ABNORMAL LOW (ref 3.5–5.0)
Alkaline Phosphatase: 77 U/L (ref 38–126)
BILIRUBIN TOTAL: 1.3 mg/dL — AB (ref 0.3–1.2)
BUN: 39 mg/dL — AB (ref 6–20)
CHLORIDE: 102 mmol/L (ref 101–111)
CO2: 21 mmol/L — ABNORMAL LOW (ref 22–32)
Calcium: 8.9 mg/dL (ref 8.9–10.3)
Creatinine, Ser: 2.99 mg/dL — ABNORMAL HIGH (ref 0.61–1.24)
GFR calc Af Amer: 21 mL/min — ABNORMAL LOW (ref >60)
GFR, EST NON AFRICAN AMERICAN: 18 mL/min — AB (ref >60)
Glucose, Bld: 108 mg/dL — ABNORMAL HIGH (ref 65–99)
POTASSIUM: 4.4 mmol/L (ref 3.5–5.1)
Sodium: 134 mmol/L — ABNORMAL LOW (ref 135–145)
TOTAL PROTEIN: 7.8 g/dL (ref 6.5–8.1)

## 2017-02-27 LAB — CBC
HEMATOCRIT: 34.3 % — AB (ref 39.0–52.0)
Hemoglobin: 10.7 g/dL — ABNORMAL LOW (ref 13.0–17.0)
MCH: 29.5 pg (ref 26.0–34.0)
MCHC: 31.2 g/dL (ref 30.0–36.0)
MCV: 94.5 fL (ref 78.0–100.0)
PLATELETS: 200 10*3/uL (ref 150–400)
RBC: 3.63 MIL/uL — AB (ref 4.22–5.81)
RDW: 15.3 % (ref 11.5–15.5)
WBC: 20.8 10*3/uL — AB (ref 4.0–10.5)

## 2017-02-27 LAB — I-STAT ARTERIAL BLOOD GAS, ED
Acid-base deficit: 4 mmol/L — ABNORMAL HIGH (ref 0.0–2.0)
BICARBONATE: 20.8 mmol/L (ref 20.0–28.0)
O2 SAT: 96 %
PCO2 ART: 37.9 mmHg (ref 32.0–48.0)
PO2 ART: 89 mmHg (ref 83.0–108.0)
Patient temperature: 98.4
TCO2: 22 mmol/L (ref 22–32)
pH, Arterial: 7.347 — ABNORMAL LOW (ref 7.350–7.450)

## 2017-02-27 LAB — URINALYSIS, ROUTINE W REFLEX MICROSCOPIC
Bilirubin Urine: NEGATIVE
Glucose, UA: NEGATIVE mg/dL
HGB URINE DIPSTICK: NEGATIVE
Ketones, ur: NEGATIVE mg/dL
Leukocytes, UA: NEGATIVE
NITRITE: NEGATIVE
Protein, ur: 100 mg/dL — AB
SPECIFIC GRAVITY, URINE: 1.019 (ref 1.005–1.030)
pH: 5 (ref 5.0–8.0)

## 2017-02-27 LAB — I-STAT CG4 LACTIC ACID, ED: LACTIC ACID, VENOUS: 2.54 mmol/L — AB (ref 0.5–1.9)

## 2017-02-27 LAB — LACTIC ACID, PLASMA: Lactic Acid, Venous: 3.4 mmol/L (ref 0.5–1.9)

## 2017-02-27 LAB — INFLUENZA PANEL BY PCR (TYPE A & B)
INFLAPCR: NEGATIVE
INFLBPCR: NEGATIVE

## 2017-02-27 LAB — MRSA PCR SCREENING: MRSA by PCR: POSITIVE — AB

## 2017-02-27 LAB — TROPONIN I
TROPONIN I: 0.14 ng/mL — AB (ref <0.03)
Troponin I: 0.13 ng/mL (ref <0.03)

## 2017-02-27 LAB — BRAIN NATRIURETIC PEPTIDE: B NATRIURETIC PEPTIDE 5: 1214.4 pg/mL — AB (ref 0.0–100.0)

## 2017-02-27 MED ORDER — PREDNISONE 20 MG PO TABS
50.0000 mg | ORAL_TABLET | Freq: Every day | ORAL | Status: DC
  Administered 2017-02-28 – 2017-03-01 (×2): 50 mg via ORAL
  Filled 2017-02-27 (×2): qty 3

## 2017-02-27 MED ORDER — IPRATROPIUM-ALBUTEROL 0.5-2.5 (3) MG/3ML IN SOLN
3.0000 mL | Freq: Four times a day (QID) | RESPIRATORY_TRACT | Status: DC
  Administered 2017-02-27 – 2017-03-03 (×14): 3 mL via RESPIRATORY_TRACT
  Filled 2017-02-27 (×15): qty 3

## 2017-02-27 MED ORDER — HEPARIN SODIUM (PORCINE) 5000 UNIT/ML IJ SOLN
5000.0000 [IU] | Freq: Three times a day (TID) | INTRAMUSCULAR | Status: DC
  Administered 2017-02-27 – 2017-03-07 (×22): 5000 [IU] via SUBCUTANEOUS
  Filled 2017-02-27 (×23): qty 1

## 2017-02-27 MED ORDER — VANCOMYCIN HCL IN DEXTROSE 1-5 GM/200ML-% IV SOLN
1000.0000 mg | Freq: Once | INTRAVENOUS | Status: AC
  Administered 2017-02-27: 1000 mg via INTRAVENOUS
  Filled 2017-02-27: qty 200

## 2017-02-27 MED ORDER — MELATONIN 3 MG PO TABS
6.0000 mg | ORAL_TABLET | Freq: Every day | ORAL | Status: DC
  Administered 2017-02-27 – 2017-03-08 (×8): 6 mg via ORAL
  Filled 2017-02-27 (×9): qty 2

## 2017-02-27 MED ORDER — ESCITALOPRAM OXALATE 5 MG PO TABS
5.0000 mg | ORAL_TABLET | Freq: Every evening | ORAL | Status: DC
  Administered 2017-02-28 – 2017-03-07 (×8): 5 mg via ORAL
  Filled 2017-02-27 (×8): qty 1

## 2017-02-27 MED ORDER — ALBUTEROL (5 MG/ML) CONTINUOUS INHALATION SOLN
10.0000 mg/h | INHALATION_SOLUTION | RESPIRATORY_TRACT | Status: DC
  Administered 2017-02-27: 10 mg/h via RESPIRATORY_TRACT
  Filled 2017-02-27: qty 20

## 2017-02-27 MED ORDER — PIPERACILLIN-TAZOBACTAM IN DEX 2-0.25 GM/50ML IV SOLN
2.2500 g | Freq: Three times a day (TID) | INTRAVENOUS | Status: DC
  Administered 2017-02-27 – 2017-02-28 (×2): 2.25 g via INTRAVENOUS
  Filled 2017-02-27 (×3): qty 50

## 2017-02-27 MED ORDER — SODIUM CHLORIDE 0.9 % IV BOLUS (SEPSIS)
500.0000 mL | Freq: Once | INTRAVENOUS | Status: AC
  Administered 2017-02-27: 500 mL via INTRAVENOUS

## 2017-02-27 MED ORDER — VANCOMYCIN HCL IN DEXTROSE 1-5 GM/200ML-% IV SOLN: 1000.0000 mg | INTRAVENOUS | Status: DC

## 2017-02-27 MED ORDER — TRAMADOL HCL 50 MG PO TABS: 50.0000 mg | ORAL_TABLET | Freq: Four times a day (QID) | ORAL | Status: DC | PRN

## 2017-02-27 MED ORDER — ATORVASTATIN CALCIUM 40 MG PO TABS
40.0000 mg | ORAL_TABLET | Freq: Every day | ORAL | Status: DC
  Administered 2017-02-28 – 2017-03-06 (×7): 40 mg via ORAL
  Filled 2017-02-27 (×8): qty 1

## 2017-02-27 MED ORDER — ASPIRIN EC 81 MG PO TBEC
81.0000 mg | DELAYED_RELEASE_TABLET | Freq: Every day | ORAL | Status: DC
  Administered 2017-02-28 – 2017-03-06 (×7): 81 mg via ORAL
  Filled 2017-02-27 (×8): qty 1

## 2017-02-27 MED ORDER — MELATONIN 5 MG PO TABS
1.0000 | ORAL_TABLET | Freq: Every day | ORAL | Status: DC
  Filled 2017-02-27: qty 1

## 2017-02-27 MED ORDER — ALBUTEROL SULFATE (2.5 MG/3ML) 0.083% IN NEBU
5.0000 mg | INHALATION_SOLUTION | Freq: Once | RESPIRATORY_TRACT | Status: AC
  Administered 2017-02-27: 5 mg via RESPIRATORY_TRACT
  Filled 2017-02-27: qty 6

## 2017-02-27 MED ORDER — ISOSORBIDE MONONITRATE ER 30 MG PO TB24
30.0000 mg | ORAL_TABLET | Freq: Every day | ORAL | Status: DC
  Administered 2017-02-28 – 2017-03-06 (×7): 30 mg via ORAL
  Filled 2017-02-27 (×9): qty 1

## 2017-02-27 MED ORDER — SODIUM CHLORIDE 0.9 % IV SOLN
INTRAVENOUS | Status: DC
  Administered 2017-03-01 – 2017-03-02 (×2): via INTRAVENOUS

## 2017-02-27 MED ORDER — PIPERACILLIN-TAZOBACTAM 3.375 G IVPB 30 MIN
3.3750 g | Freq: Once | INTRAVENOUS | Status: AC
  Administered 2017-02-27: 3.375 g via INTRAVENOUS
  Filled 2017-02-27: qty 50

## 2017-02-27 MED ORDER — GABAPENTIN 300 MG PO CAPS
300.0000 mg | ORAL_CAPSULE | Freq: Every day | ORAL | Status: DC
  Administered 2017-02-28 – 2017-03-06 (×7): 300 mg via ORAL
  Filled 2017-02-27 (×8): qty 1

## 2017-02-27 MED ORDER — METHYLPREDNISOLONE SODIUM SUCC 125 MG IJ SOLR
125.0000 mg | Freq: Once | INTRAMUSCULAR | Status: AC
  Administered 2017-02-27: 125 mg via INTRAVENOUS
  Filled 2017-02-27: qty 2

## 2017-02-27 NOTE — ED Notes (Signed)
Phlebotomy at the bedside  

## 2017-02-27 NOTE — Progress Notes (Signed)
Pharmacy Antibiotic Note  [name] [name] is a 83 y.o. male admitted on 02/27/2017 with pneumonia.  Pharmacy has been consulted for Zosyn and vancomycin dosing.  Received one dose of vancomycin 1000 mg and Zosyn 3.375 mg in the ED.  Plan: Give vancomycin IV 1000 mg dose x 1 to complete a loading dose of 2000 mg, followed by  Vancomycin 1000 mg IV every 48 hours.  Goal trough 15-20 mcg/mL. Zosyn 2.25g IV q8h (4 hour infusion). Monitor clinical progress, cultures/sensitivities, renal function, abx plan Vancomycin trough as indicated   Height: 5\' 10"  (177.8 cm) Weight: 202 lb (91.6 kg) IBW/kg (Calculated) : 73  Temp (24hrs), Avg:98.4 F (36.9 C), Min:98.4 F (36.9 C), Max:98.4 F (36.9 C)  Recent Labs  Lab 02/27/17 1338 02/27/17 1439  WBC 20.8*  --   CREATININE 2.99*  --   LATICACIDVEN  --  2.54*    Estimated Creatinine Clearance: 21.3 mL/min (A) (by C-G formula based on SCr of 2.99 mg/dL (H)).    No Known Allergies  Antimicrobials this admission: 12/25 vancomycin >>  12/25 Zosyn >>   Dose adjustments this admission:   Microbiology results: 12/25 BCx: sent  12/25 MRSA PCR: sent  Thank you for allowing [place] to participate in this patients care.  Jens Som, PharmD Clinical phone for 02/27/2017 from 7a-3:30p: [phone] If after 3:30p, please call main pharmacy at: [phone] 02/27/2017 6:41 PM

## 2017-02-27 NOTE — ED Notes (Signed)
Attempted Report x1.   

## 2017-02-27 NOTE — Progress Notes (Signed)
Notified by Microbiology that patients MRSA PCR sample came back positive. Contact isolation precautions started.

## 2017-02-27 NOTE — ED Notes (Signed)
Dr.Yelverton shown results of troponin level. Ed-lab

## 2017-02-27 NOTE — ED Notes (Signed)
RT at the bedside.

## 2017-02-27 NOTE — ED Notes (Signed)
Requesting fluids, antibiotics and Code Sepsis. MD made this RN aware he would not like to give an excessive amount of fluids due to fluid overload, but he would order antibiotics.

## 2017-02-27 NOTE — ED Notes (Signed)
MD Yelverton at the bedside  

## 2017-02-27 NOTE — Progress Notes (Signed)
Patient transported from ED to room 2C01 on bipap without any complications.

## 2017-02-27 NOTE — H&P (Signed)
History and Physical  [name] [name] FTD:[id number] DOB: Jun 25, 1931 DOA: 02/27/2017  Referring physician: Dr. Lita Mains PCP: Jani Gravel, MD  Outpatient Specialists: Dr. Ramaswamy(Pulmonlogy), Lauretta Chester, PA-C(Cardiology) Patient coming from:Home At his baseline ambulates independently  Chief Complaint: confusion  HPI: [name] [name] is a 83 y.o. male with medical history significant for CAD status post CABG (2004), also bradycardia status post peacemaker, chronic combined CHF, PAD, AAA status post repair 2014, prior CVA, HLD, HTN, tobacco abuse who presents on 02/27/2017 with increased confusion witnessed by wife was found to be septic secondary to likely pneumonia, gated by COPD exacerbation.  Patient and wife contributed  to history.  Over the past week patient complained of worsening cough productive of white phlegm without significant fevers or chills.  His wife had decreased his Lasix regimen to half his prescribed dose she reports he lost 10 pounds within the past week and contributed to his Lasix regimen.  He denies any recent sick contacts, nausea, vomiting, abdominal pain or diarrhea.  No dysuria or hematuria. Of note, patient was recently evaluated as an outpatient pulmonology.  Inhaled steroids were added to his current COPD regimen however both patient and wife reported no improvement in his chronic dyspnea.  On today, wife was concerned this patient seemed very confused).  He asked her odd questions concerning when she was going to work (she has been retired for quite some time), how he was starting a new job (he no longer works as well).  Family was concerned he may be having a stroke she has had a CVA in the past.  So they brought him to the ED.   ED Course: In the ED, patient was afebrile, tachypneic to 23-31 respiratory rate, oxygen saturation 89% requiring 2 L O2, heart rate 104-111, blood pressure 100/63.ABG pH 7.34, CO2 37, oxygen 96.Lab workup significant for creatinine  2.99, CO2 21, WBC 20.8, hemoglobin 10.7, BNP: 1214, troponin 0 0.14. Chest x-ray: Small to moderate right pleural effusion, interstitial opacity in the interval between infection and edema. -In the ED patient was given IV Vanco Zosyn, 500 cc bolus of normal saline, albuterol nebs and IV Solu-Medrol.  And started on BiPAP  Before admission to stepdown unit.  Review of Systems:As mentioned in the history of present illness.Review of systems are otherwise negative Patient seen in the ED .    Past Medical History:  Diagnosis Date  . AAA (abdominal aortic aneurysm) [facility])    Surgical repair June, 2014  . CAD (coronary artery disease)    a. s/p CABG in 2005 x 4. b. low risk nuc 2016.  . Carotid artery occlusion   . Chronic anemia   . Chronic combined systolic and diastolic CHF (congestive heart failure) ([place])   . Chronic renal insufficiency   . CKD (chronic kidney disease), stage III ([place])   . Complete heart block ([place])   . COPD (chronic obstructive pulmonary disease) ([place]) 08/15/2012  . CVA (cerebral vascular accident) ([place]) ~ 2003   residual R leg weakness (01/22/2017)  . Dyslipidemia   . Fall at home 04/09/2013   Fx Right Hip  . HTN (hypertension)   . Hx of CABG    2005  . Morbid obesity ([place])   . Myocardial infarction [facility])    "was told I had one before I had my heart bypass" (01/22/2017)  . Neuropathy    Right foot  . Peripheral vascular disease ([place])    a. s/p aorto-bifem bypass.  . Presence of permanent cardiac  pacemaker   . S/P placement of cardiac pacemaker    a. [employer] 09/2016.  . Status post abdominal aortic aneurysm repair    June, 2014  . Tobacco abuse    quit 2014  . Type II diabetes mellitus ([place])    Past Surgical History:  Procedure Laterality Date  . ABDOMINAL AORTAGRAM N/A 08/07/2012   Procedure: ABDOMINAL AORTAGRAM;  Surgeon: Rosetta Posner, MD;  Location: [facility] CATH LAB;  Service: Cardiovascular;  Laterality: N/A;  . AORTA - BILATERAL FEMORAL ARTERY BYPASS  GRAFT N/A 08/09/2012   Procedure: AORTA BIFEMORAL BYPASS GRAFT;  Surgeon: Rosetta Posner, MD;  Location: [place];  Service: Vascular;  Laterality: N/A;  . CORONARY ARTERY BYPASS GRAFT  2005   CABG X"5"  . HIP ARTHROPLASTY Right 04/09/2013   Procedure: ARTHROPLASTY BIPOLAR HIP;  Surgeon: Gearlean Alf, MD;  Location: [facility] ORS;  Service: Orthopedics;  Laterality: Right;  . INSERT / REPLACE / REMOVE PACEMAKER    . JOINT REPLACEMENT    . LUMBAR DISC SURGERY  1977   Lumbar HNP surgery  . PACEMAKER IMPLANT N/A 09/11/2016   Procedure: Pacemaker Implant;  Surgeon: Deboraha Sprang, MD;  Location: [place] CV LAB;  Service: Cardiovascular;  Laterality: N/A;  . TONSILLECTOMY  1940    Social History:  reports that he quit smoking about 4 years ago. His smoking use included cigarettes. He has a 50.00 pack-year smoking history. he has never used smokeless tobacco. He reports that he drinks alcohol. He reports that he does not use drugs.   No Known Allergies  Family History  Problem Relation Age of Onset  . Tuberculosis Mother 62  . Coronary artery disease Father   . Varicose Veins Father       Prior to Admission medications   Medication Sig Start Date End Date Taking? Authorizing Provider  albuterol (PROVENTIL) (2.5 MG/3ML) 0.083% nebulizer solution Take 3 mLs (2.5 mg total) by nebulization every 4 (four) hours as needed for wheezing or shortness of breath. 01/24/17  Yes Duke, Tami Lin, PA  ANORO ELLIPTA 62.5-25 MCG/INH AEPB Inhale 1 puff into the lungs every morning.  07/03/16  Yes [provider]  aspirin 81 MG tablet Take 81 mg by mouth every morning.    Yes [provider]  atorvastatin (LIPITOR) 40 MG tablet Take 40 mg by mouth every morning.    Yes [provider]  Chlorphen-Phenyleph-APAP (CORICIDIN D COLD/FLU/SINUS) 2-5-325 MG TABS Take 2 tablets by mouth daily.   Yes [provider]  cholecalciferol (VITAMIN D) 1000 UNITS tablet Take 1,000 Units by mouth  every morning.    Yes [provider]  escitalopram (LEXAPRO) 5 MG tablet Take 5 mg by mouth every evening.  12/08/13  Yes [provider]  furosemide (LASIX) 40 MG tablet Take 1 tablet (40 mg total) by mouth daily. Patient taking differently: Take 20 mg by mouth daily.  02/06/17 02/01/18 Yes Dunn, Dayna N, PA-C  gabapentin (NEURONTIN) 300 MG capsule Take 300 mg by mouth every morning.    Yes [provider]  hydrALAZINE (APRESOLINE) 50 MG tablet Take 1 tablet (50 mg total) by mouth 3 (three) times daily. 09/12/16  Yes Baldwin Jamaica, PA-C  isosorbide mononitrate (IMDUR) 30 MG 24 hr tablet Take 1 tablet (30 mg total) by mouth daily. 02/06/17 05/07/17 Yes Dunn, Dayna N, PA-C  Melatonin 5 MG TABS Take 1 tablet by mouth at bedtime.   Yes [provider]  Mometasone Furoate [facility] HFA)  100 MCG/ACT AERO Inhale 2 puffs into the lungs daily. Patient taking differently: Inhale 2 puffs into the lungs at bedtime.  02/21/17  Yes Brand Males, MD  montelukast (SINGULAIR) 10 MG tablet Take 10 mg by mouth every evening.  08/09/16  Yes [provider]  oxybutynin (DITROPAN-XL) 5 MG 24 hr tablet Take 5 mg by mouth at bedtime.  07/01/12  Yes [provider]  potassium chloride (K-DUR) 10 MEQ tablet Take 1 tablet (10 mEq total) by mouth daily. 02/06/17 02/01/18 Yes Dunn, Dayna N, PA-C  PROAIR HFA 108 (90 Base) MCG/ACT inhaler Inhale 2 puffs into the lungs every 4 (four) hours as needed for wheezing or shortness of breath. 01/24/17  Yes Duke, Tami Lin, PA  TRADJENTA 5 MG TABS tablet Take 5 mg by mouth every morning. 09/04/16  Yes [provider]  traMADol (ULTRAM) 50 MG tablet Take 1 tablet by mouth every 6 (six) hours as needed for moderate pain.  05/28/13  Yes [provider]  vitamin B-12 (CYANOCOBALAMIN) 100 MCG tablet Take 100 mcg by mouth every morning.    Yes [provider]  zolpidem (AMBIEN) 10 MG tablet Take 5 mg by mouth at  bedtime.  04/11/13  Yes Robbie Lis, MD  Mometasone Furoate [facility] HFA) 100 MCG/ACT AERO Inhale 2 puffs into the lungs daily. Patient not taking: Reported on 02/27/2017 02/21/17   Brand Males, MD    Physical Exam: BP 118/64 (BP Location: Right Arm)   Pulse (!) 104   Temp 98.4 F (36.9 C) (Oral)   Resp (!) 23   Ht 5\' 10"  (1.778 m)   Wt 91.6 kg (202 lb)   SpO2 96%   BMI 28.98 kg/m    General: Lying in bed in Appear in  no distress Eyes: EOMI, anicteric ENT: BiPAP in place Neck: no appreciable JVD Cardiovascular: regular rate and rhythm, no appreciable Murmurs, rubs or gallops. Peripheral Pulses present.  No edema Respiratory: Difficult to hear breath sounds over BiPAP, occasional expiratory wheeze, no obvious respiratory distress. Abdomen: soft, non-distended, non-tender, normal bowel sounds, no guarding or rebound tenderness Skin:  No Rash Musculoskeletal: No clubbing / cyanosis. No joint deformity upper and lower extremities. Good ROM, no contractures. Normal muscle tone Neurologic: Grossly no focal neuro deficit.Mental status AAOx3, speech normal, Psychiatric: Appropriate affect, and mood          Labs on Admission:  Basic Metabolic Panel: Recent Labs  Lab 02/27/17 1338  NA 134*  K 4.4  CL 102  CO2 21*  GLUCOSE 108*  BUN 39*  CREATININE 2.99*  CALCIUM 8.9   Liver Function Tests: Recent Labs  Lab 02/27/17 1338  AST 27  ALT 12*  ALKPHOS 77  BILITOT 1.3*  PROT 7.8  ALBUMIN 3.3*   No results for input(s): LIPASE, AMYLASE in the last 168 hours. No results for input(s): AMMONIA in the last 168 hours. CBC: Recent Labs  Lab 02/27/17 1338  WBC 20.8*  HGB 10.7*  HCT 34.3*  MCV 94.5  PLT 200   Cardiac Enzymes: Recent Labs  Lab 02/27/17 1338  TROPONINI 0.14*    BNP (last 3 results) Recent Labs    09/08/16 1302 01/22/17 2031 02/27/17 1338  BNP 724.6* 811.1* 1,214.4*    ProBNP (last 3 results) Recent Labs    02/06/17 1302  PROBNP  2,488*    CBG: No results for input(s): GLUCAP in the last 168 hours.  Radiological Exams on Admission: Dg Chest [facility] 1 View  Result  Date: 02/27/2017 CLINICAL DATA:  Shortness of breath.  Right-sided chest pain EXAM: PORTABLE CHEST 1 VIEW COMPARISON:  01/22/2017 FINDINGS: Chronic cardiomegaly. Chronic vascular pedicle widening. Status post CABG and dual-chamber pacer implant. There is a small or moderate right pleural effusion. A small effusion was seen on the right lateral view previously. Generalized interstitial coarsening. No Kerley lines at the peripheral left lung. No pneumothorax. IMPRESSION: 1. Small to moderate right pleural effusion. 2. Interstitial opacity not distinguishable between infection and edema. Electronically Signed   By: Monte Fantasia M.D.   On: 02/27/2017 14:08    EKG: Independently reviewed. Chronic left bundle and no new ischemic changes.  Assessment/Plan Present on Admission: . Pneumonia . Essential hypertension . Dyslipidemia . COPD (chronic obstructive pulmonary disease) ([place]) . Dyspnea . Renal failure, acute on chronic (HCC)  Active Problems:   Essential hypertension   Dyslipidemia   COPD (chronic obstructive pulmonary disease) (HCC)   Dyspnea   Renal failure, acute on chronic (HCC)   Pneumonia   Severe sepsis (HCC)   Lactic acidosis   Acute respiratory Failure, multifactorial in etiology: Pneumonia, COPD/CHF exacerbation Sepsis, likely secondary to pneumonia.   Leukocytosis, dyspnea/cough, and lactic acidosis with chest x-ray showing interstitial opacities concerning for likely pneumonia.  Respiratory status likely multifactorial in etiology contributions from CHF, COPD, and pneumonia.  Requiring new oxygen currently at 2 L (on no supplemental oxygen at home) - Vanc and Zosyn per pharmacy protocol -Follow-up blood cultures -Inhaler regimen for COPD exacerbation - Will address COPD and pneumonia treatment.  Hold off on diuresis currently given  severe lactic acidosis and relative hypotension which may worsen the setting of Lasix treatment now -Repeat lactic acid  Chronic combined systolic and diastolic CHF.  Dry weight 205 pounds per cardiology (02/16/17).  BNP elevated greater than 1200.  Likely contributing to dyspnea however believe sepsis related to likely pneumonia as a driving force. -We will hold off on aggressive diuresis in the setting of relative hypotension and lactic acidosis - Continue antibiotics, gentle hydration as mentioned above -Expect that he will need to resume diuresis as infection treatment improves -Continue daily weights, monitor intake and output, daily BMP  AKI on CKD. Most likely prerenal etiology in setting of sepsis/CHF exacerbation. Baseline creatinine 2.422.6.  Admission creatinine 2.99 -Monitor BMP -Avoid nephrotoxins, strict intake and output, low sodium renal diet, fluid restriction  COPD exacerbation.  Increased productive cough, dyspnea.  No wheezing on exam however evaluated after receiving albuterol nebs while on BiPAP and clinically greatly improved. -Continue scheduled duo nebs -Status post IV Solu-Medrol, continue prednisone 50 mg x 4 days -Follow new inhaler regimen as instituted by outpatient pulmonologist once closer to discharge  CAD status post CABG.  Elevated troponin, likely demand ischemia in setting of severe sepsis.Patient did not complain of any chest pain.  EKG at baseline with chronic left bundle and no new ischemic changes. - Trend troponin -Continue home aspirin and imdur  Hypertension, currently relative hypotension with SBP is ranging in the low 90s to high 100s.  The setting of severe sepsis. -Hold home hydralazine until blood pressure improves  Hyperlipidemia - Continue home atorvastatin  Depression, stable - Continue home meds: Lexapro  Peripheral neuropathy, stable - Continue home gabapentin   DVT prophylaxis:  Subcutaneous Heparin  Code Status:  DNR  Family Communication: Wife was present at bedside and updated appropriately at the time of interview.   Disposition Plan: improvement in respiratory status ( treat PNA--deescalation of IV antibiotics), de-escalation of BiPAP  Consults called: None  Admission status: Admitted as inpatient to step-down unit.      Desiree Hane MD [facility]  Pager [phone]  If 7PM-7AM, please contact night-coverage [email or web address] Password [facility]  02/27/2017, 6:49 PM

## 2017-02-27 NOTE — ED Notes (Signed)
Attempted IV unable to access second Iv.

## 2017-02-27 NOTE — Progress Notes (Signed)
CRITICAL VALUE ALERT  Critical Value:  LA 3.4  Date & Time Notied:  02/27/2017     2112  Provider Notified: Alanda Slim  Orders Received/Actions taken: Waiting for new orders

## 2017-02-27 NOTE — ED Triage Notes (Addendum)
Per [facility], Pt is coming from home with complaints of intermittent altered mental status that started at 0730 this morning when patient woke up. Family reported that he stated he was going to start a new job and talking all about this, but this is not true. Pt is alert and oriented x 4 at this time. Hx of stroke 20 years ago. Right sided deficits noted from past stroke. Denies any other complaints, but reported to have temperature of 102. Vitals per EMS: 148/88, 112 HR, 20 RR, 93% on RA, 150 CBG. Reported pacemaker and paced rhythm on the EKG.

## 2017-02-27 NOTE — Progress Notes (Signed)
Entered room to find patient in visible respiratory distress, RR 30 and BBS diminished expiratory wheezes. Patient placed on BiPAP 12/6 40% per MD orders. Cont neb given through BiPAP. ABG obtained and results given to MD. Patient seems more comfortable with less work of breathing at this time. Family at bedside.

## 2017-02-27 NOTE — ED Notes (Signed)
Phlebotomy at the bedside getting lactic acid

## 2017-02-28 ENCOUNTER — Encounter (HOSPITAL_COMMUNITY): Payer: Self-pay | Admitting: Internal Medicine

## 2017-02-28 DIAGNOSIS — R6520 Severe sepsis without septic shock: Secondary | ICD-10-CM

## 2017-02-28 DIAGNOSIS — I5043 Acute on chronic combined systolic (congestive) and diastolic (congestive) heart failure: Secondary | ICD-10-CM

## 2017-02-28 DIAGNOSIS — N183 Chronic kidney disease, stage 3 (moderate): Secondary | ICD-10-CM

## 2017-02-28 DIAGNOSIS — A419 Sepsis, unspecified organism: Secondary | ICD-10-CM

## 2017-02-28 DIAGNOSIS — Z22322 Carrier or suspected carrier of Methicillin resistant Staphylococcus aureus: Secondary | ICD-10-CM

## 2017-02-28 DIAGNOSIS — I2510 Atherosclerotic heart disease of native coronary artery without angina pectoris: Secondary | ICD-10-CM

## 2017-02-28 DIAGNOSIS — J441 Chronic obstructive pulmonary disease with (acute) exacerbation: Secondary | ICD-10-CM

## 2017-02-28 DIAGNOSIS — I25700 Atherosclerosis of coronary artery bypass graft(s), unspecified, with unstable angina pectoris: Secondary | ICD-10-CM

## 2017-02-28 HISTORY — DX: Carrier or suspected carrier of methicillin resistant Staphylococcus aureus: Z22.322

## 2017-02-28 LAB — BLOOD CULTURE ID PANEL (REFLEXED)
Acinetobacter baumannii: NOT DETECTED
CANDIDA GLABRATA: NOT DETECTED
Candida albicans: NOT DETECTED
Candida krusei: NOT DETECTED
Candida parapsilosis: NOT DETECTED
Candida tropicalis: NOT DETECTED
ENTEROBACTER CLOACAE COMPLEX: NOT DETECTED
ENTEROCOCCUS SPECIES: NOT DETECTED
Enterobacteriaceae species: NOT DETECTED
Escherichia coli: NOT DETECTED
HAEMOPHILUS INFLUENZAE: NOT DETECTED
Klebsiella oxytoca: NOT DETECTED
Klebsiella pneumoniae: NOT DETECTED
LISTERIA MONOCYTOGENES: NOT DETECTED
NEISSERIA MENINGITIDIS: NOT DETECTED
Proteus species: NOT DETECTED
Pseudomonas aeruginosa: NOT DETECTED
STREPTOCOCCUS AGALACTIAE: NOT DETECTED
STREPTOCOCCUS PNEUMONIAE: DETECTED — AB
STREPTOCOCCUS PYOGENES: NOT DETECTED
STREPTOCOCCUS SPECIES: DETECTED — AB
Serratia marcescens: NOT DETECTED
Staphylococcus aureus (BCID): NOT DETECTED
Staphylococcus species: NOT DETECTED

## 2017-02-28 LAB — CBC
HEMATOCRIT: 30.5 % — AB (ref 39.0–52.0)
HEMOGLOBIN: 9.5 g/dL — AB (ref 13.0–17.0)
MCH: 29.4 pg (ref 26.0–34.0)
MCHC: 31.1 g/dL (ref 30.0–36.0)
MCV: 94.4 fL (ref 78.0–100.0)
PLATELETS: 172 10*3/uL (ref 150–400)
RBC: 3.23 MIL/uL — AB (ref 4.22–5.81)
RDW: 15.4 % (ref 11.5–15.5)
WBC: 21.5 10*3/uL — AB (ref 4.0–10.5)

## 2017-02-28 LAB — LACTIC ACID, PLASMA: Lactic Acid, Venous: 1.8 mmol/L (ref 0.5–1.9)

## 2017-02-28 LAB — BASIC METABOLIC PANEL
ANION GAP: 10 (ref 5–15)
BUN: 42 mg/dL — ABNORMAL HIGH (ref 6–20)
CHLORIDE: 101 mmol/L (ref 101–111)
CO2: 20 mmol/L — ABNORMAL LOW (ref 22–32)
CREATININE: 2.94 mg/dL — AB (ref 0.61–1.24)
Calcium: 8.4 mg/dL — ABNORMAL LOW (ref 8.9–10.3)
GFR calc non Af Amer: 18 mL/min — ABNORMAL LOW (ref >60)
GFR, EST AFRICAN AMERICAN: 21 mL/min — AB (ref >60)
Glucose, Bld: 244 mg/dL — ABNORMAL HIGH (ref 65–99)
POTASSIUM: 4.5 mmol/L (ref 3.5–5.1)
SODIUM: 131 mmol/L — AB (ref 135–145)

## 2017-02-28 LAB — GLUCOSE, CAPILLARY: Glucose-Capillary: 175 mg/dL — ABNORMAL HIGH (ref 65–99)

## 2017-02-28 LAB — CG4 I-STAT (LACTIC ACID): Lactic Acid, Venous: 2.42 mmol/L (ref 0.5–1.9)

## 2017-02-28 MED ORDER — CHLORHEXIDINE GLUCONATE CLOTH 2 % EX PADS
6.0000 | MEDICATED_PAD | Freq: Every day | CUTANEOUS | Status: AC
  Administered 2017-02-28 – 2017-03-04 (×5): 6 via TOPICAL

## 2017-02-28 MED ORDER — ORAL CARE MOUTH RINSE
15.0000 mL | Freq: Two times a day (BID) | OROMUCOSAL | Status: DC
  Administered 2017-03-01 – 2017-03-07 (×9): 15 mL via OROMUCOSAL

## 2017-02-28 MED ORDER — DEXTROSE 5 % IV SOLN
2.0000 g | INTRAVENOUS | Status: DC
  Administered 2017-02-28 – 2017-03-08 (×9): 2 g via INTRAVENOUS
  Filled 2017-02-28 (×9): qty 2

## 2017-02-28 MED ORDER — ZOLPIDEM TARTRATE 5 MG PO TABS
5.0000 mg | ORAL_TABLET | Freq: Every evening | ORAL | Status: DC | PRN
  Administered 2017-03-01 – 2017-03-06 (×4): 5 mg via ORAL
  Filled 2017-02-28 (×4): qty 1

## 2017-02-28 MED ORDER — MUPIROCIN 2 % EX OINT
1.0000 "application " | TOPICAL_OINTMENT | Freq: Two times a day (BID) | CUTANEOUS | Status: AC
  Administered 2017-02-28 – 2017-03-04 (×9): 1 via NASAL
  Filled 2017-02-28: qty 22

## 2017-02-28 MED ORDER — FUROSEMIDE 10 MG/ML IJ SOLN
40.0000 mg | Freq: Once | INTRAMUSCULAR | Status: AC
  Administered 2017-02-28: 40 mg via INTRAVENOUS
  Filled 2017-02-28: qty 4

## 2017-02-28 NOTE — Consult Note (Addendum)
The patient has been seen in conjunction with Roby Lofts, PA-C. All aspects of care have been considered and discussed. The patient has been personally interviewed, examined, and all clinical data has been reviewed.   Patient with history of significant comorbidities including CAD with prior CABG, COPD, tachycardia-bradycardia syndrome with permanent pacemaker (July 2018), and stage IV chronic kidney disease.  Admitted with progressive dyspnea/cough with clinical diagnosis of CHF and possible pneumonia which we are asked to evaluate and help manage.  Using accessory muscles to breathe.  Respiratory rate 16.  Skin is warm to touch.  Heart rate is 107 bpm.  Telemetry demonstrates relatively flat heart rates in the 105-108 beats per minute range.  No clear P waves noted.  BNP is 1200, recent echo demonstrates EF 45%, chest x-ray demonstrates pleural effusion on the right, poor inspiratory effort, and congestion/pneumonia.  Probable component of acute on chronic combined systolic and diastolic, which will probably improve with IV diuresis.  Rule out atrial arrhythmia contributing to the presentation and aggravating heart failure.  We have asked for interrogation to rule out atrial flutter.  Concomitant therapy with antibiotics to treat pneumonia/sepsis given severe leukocytosis.  Will need close observation of kidney function.    Clearly, if the patient has an atrial arrhythmia, this will need to be dealt with as appropriate.   Cardiology Consultation:   Patient ID: [name] [name]; [id number]; 1931-06-08   Admit date: 02/27/2017 Date of Consult: 02/28/2017  Primary Care Provider: Jani Gravel, MD Primary Cardiologist: Dr. Meda Coffee Primary Nephrologist: Dr. Arty Baumgartner   Patient Profile:   [name] [name] is a 83 y.o. male with a PMH of CAD s/p CABG in 2005, PAD s/p aorto-bifem bypass, chronic combined CHF, bradycardia c/b CHB now s/p PPM 09/2016, COPD, CKD stage IV, chronic anemia, AAA  s/p repair in 2014, stroke, DM, hyperlipidemia, HTN, tobacco abuse who is being seen today for the evaluation of CHF at the request of Dr. Rockne Menghini.  History of Present Illness:   [name] [name] is an 83yo M with PMH of CABG in 2005, PAD s/p aorto-bifem bypass, chronic combined CHF, bradycardia c/b CHB now s/p PPM 09/2016, COPD, CKD stage IV, chronic anemia, AAA s/p repair in 2014, CAD, stroke, DM, hyperlipidemia, HTN, tobacco abuse who presented 02/27/2017 with increase confusion witnessed by his wife. Per wife, patient with a worsening productive cough with white sputum for the past week. On 02/27/17, wife noticed patient was confused and asking strange questions. Family concerned he may be having a stroke given history of CVA, so patient was brought to the ED.   Of note, patient with recent hospitalization from11/19-11/21/2018 for CHF exacerbation. BNP 811.1. Troponins flat.  Diuresed with IV lasix BID and transitioned to po 20mg  at discharge. Discharge weight was 204lbs. Last seen outpatient by Cardiology on 02/16/17 as a follow-up from his admission and patient reported to be doing okay from a cardiac standpoint, reported improvement in breathing with weight loss and improved LE edema. At the time of visit, still with c/o DOE. Instructed to continue lasix 40mg  daily.   Patient reports he was in his usual state of health until 1 week ago when he developed a productive cough with white sputum. On 02/27/2017, he states he was a little confused, making comments about a new job (has been retired for some time now), and wife brought him to the ED. He has not noticed any change to his DOE. Has noted some weight loss over the past couple weeks  and states his wife cut his lasix pill in half (from 40mg  to 20mg ), as she thought he was losing too much fluid. Last weight at home was 208lbs on 02/27/17. States his LE edema has improved over the past 1-2 weeks. Orthopnea has been stable. Notes some rib pain when coughing.  Denies any chest pressure, tightness, PND, dizziness, lightheadedness.   In the ED, patient was afebrile, tachypneic, tachycardic, O2 sat 89% on RA. WBC 20.8, Hgb 9.5, Scr 2.94, BNP 1214.4. CXR c/f PNA vs edema. Admitted to step-down for PNA/COPD exacerbation.    Past Medical History:  Diagnosis Date  . AAA (abdominal aortic aneurysm) [facility])    Surgical repair June, 2014  . CAD (coronary artery disease)    a. s/p CABG in 2005 x 4. b. low risk nuc 2016.  . Carotid artery occlusion   . Chronic anemia   . Chronic combined systolic and diastolic CHF (congestive heart failure) ([place])   . Chronic renal insufficiency   . CKD (chronic kidney disease), stage III ([place])   . Complete heart block ([place])   . COPD (chronic obstructive pulmonary disease) ([place]) 08/15/2012  . CVA (cerebral vascular accident) ([place]) ~ 2003   residual R leg weakness (01/22/2017)  . Dyslipidemia   . Fall at home 04/09/2013   Fx Right Hip  . HTN (hypertension)   . Hx of CABG    2005  . Morbid obesity ([place])   . Myocardial infarction [facility])    "was told I had one before I had my heart bypass" (01/22/2017)  . Neuropathy    Right foot  . Peripheral vascular disease ([place])    a. s/p aorto-bifem bypass.  . Presence of permanent cardiac pacemaker   . S/P placement of cardiac pacemaker    a. [employer] 09/2016.  . Status post abdominal aortic aneurysm repair    June, 2014  . Tobacco abuse    quit 2014  . Type II diabetes mellitus ([place])     Past Surgical History:  Procedure Laterality Date  . ABDOMINAL AORTAGRAM N/A 08/07/2012   Procedure: ABDOMINAL AORTAGRAM;  Surgeon: Rosetta Posner, MD;  Location: [facility] CATH LAB;  Service: Cardiovascular;  Laterality: N/A;  . AORTA - BILATERAL FEMORAL ARTERY BYPASS GRAFT N/A 08/09/2012   Procedure: AORTA BIFEMORAL BYPASS GRAFT;  Surgeon: Rosetta Posner, MD;  Location: [place];  Service: Vascular;  Laterality: N/A;  . CORONARY ARTERY BYPASS GRAFT  2005   CABG X"5"  . HIP ARTHROPLASTY Right 04/09/2013     Procedure: ARTHROPLASTY BIPOLAR HIP;  Surgeon: Gearlean Alf, MD;  Location: [facility] ORS;  Service: Orthopedics;  Laterality: Right;  . INSERT / REPLACE / REMOVE PACEMAKER    . JOINT REPLACEMENT    . LUMBAR DISC SURGERY  1977   Lumbar HNP surgery  . PACEMAKER IMPLANT N/A 09/11/2016   Procedure: Pacemaker Implant;  Surgeon: Deboraha Sprang, MD;  Location: [place] CV LAB;  Service: Cardiovascular;  Laterality: N/A;  . TONSILLECTOMY  1940       Inpatient Medications: Scheduled Meds: . aspirin EC  81 mg Oral Daily  . atorvastatin  40 mg Oral Daily  . Chlorhexidine Gluconate Cloth  6 each Topical Q0600  . escitalopram  5 mg Oral QPM  . gabapentin  300 mg Oral Daily  . heparin  5,000 Units Subcutaneous Q8H  . ipratropium-albuterol  3 mL Nebulization Q6H  . isosorbide mononitrate  30 mg Oral Daily  . Melatonin  6 mg Oral QHS  .  mupirocin ointment  1 application Nasal BID  . predniSONE  50 mg Oral Q breakfast   Continuous Infusions: . sodium chloride 50 mL/hr at 02/28/17 0400  . cefTRIAXone (ROCEPHIN)  IV Stopped (02/28/17 0853)   PRN Meds: traMADol  Allergies:   No Known Allergies  Social History:   Social History   Socioeconomic History  . Marital status: Married    Spouse name: Not on file  . Number of children: Not on file  . Years of education: Not on file  . Highest education level: Not on file  Social Needs  . Financial resource strain: Not on file  . Food insecurity - worry: Not on file  . Food insecurity - inability: Not on file  . Transportation needs - medical: Not on file  . Transportation needs - non-medical: Not on file  Occupational History  . Occupation: retired  Tobacco Use  . Smoking status: Former Smoker    Packs/day: 1.00    Years: 50.00    Pack years: 50.00    Types: Cigarettes    Last attempt to quit: 08/05/2012    Years since quitting: 4.5  . Smokeless tobacco: Never Used  Substance and Sexual Activity  . Alcohol use: Yes    Comment:  01/22/2017 "quit drinking ~ 2015"  . Drug use: No  . Sexual activity: Not Currently  Other Topics Concern  . Not on file  Social History Narrative   Pt lives in [place] with spouse.  Retired from [employer].    Family History:    Family History  Problem Relation Age of Onset  . Tuberculosis Mother 12  . Coronary artery disease Father   . Varicose Veins Father      ROS:  Please see the history of present illness.  Review of Systems  All other systems reviewed and are negative.     Physical Exam/Data:   Vitals:   02/28/17 0353 02/28/17 0400 02/28/17 0719 02/28/17 0745  BP: 113/83   (!) 143/90  Pulse: 77 85  98  Resp: 17 (!) 29  (!) 22  Temp: 97.7 F (36.5 C)   97.8 F (36.6 C)  TempSrc: Axillary   Axillary  SpO2: 96% 95% 92% 94%  Weight: 207 lb 10.8 oz (94.2 kg)     Height:        Intake/Output Summary (Last 24 hours) at 02/28/2017 1311 Last data filed at 02/28/2017 1100 Gross per 24 hour  Intake 2211.67 ml  Output -  Net 2211.67 ml   Filed Weights   02/27/17 1333 02/27/17 1817 02/28/17 0353  Weight: 202 lb (91.6 kg) 199 lb 4.7 oz (90.4 kg) 207 lb 10.8 oz (94.2 kg)   Body mass index is 29.8 kg/m.  General:  Elderly male laying in bed with moderately labored breathing Neck: no JVD Vascular: No carotid bruits; distal pulses pulses 2+ bilaterally Cardiac:  Distant heart sounds,  Lungs:  Moderately labored breathing, inspiratory wheeze, diffuse rhonchi Abd: NABS, obese, soft, nontender, no hepatomegaly  Ext: no LE edema Musculoskeletal:  No deformities, residual weakness of R side; normal strength of L upper/lower extremities.  Skin: warm and dry  Neuro:  Residual right sided deficits, otherwise normal Psych:  Normal affect   EKG:  The EKG was personally reviewed and demonstrates:  Tachycardic, LBBB Telemetry:  Telemetry was personally reviewed and demonstrates:  Tachycardia, unclear if sinus; ventricular pacing.   Relevant CV Studies: ECHO  01/23/2017 Study Conclusions  - Left ventricle: The cavity size was normal.  Wall thickness was   normal. Systolic function was mildly reduced. The estimated   ejection fraction was in the range of 45% to 50%. Hypokinesis of   the basal-midinferolateral and inferior myocardium; consistent   with ischemia or infarction in the distribution of the right   coronary artery. Features are consistent with a pseudonormal left   ventricular filling pattern, with concomitant abnormal relaxation   and increased filling pressure (grade 2 diastolic dysfunction). - Ventricular septum: Septal motion showed abnormal function,   dyssynergy, and paradox. These changes are consistent with right   ventricular pacing. - Mitral valve: There was mild to moderate regurgitation directed   centrally. - Left atrium: The atrium was moderately dilated. - Pulmonary arteries: PA peak pressure: 32 mm Hg (S).  Laboratory Data:  Chemistry Recent Labs  Lab 02/27/17 1338 02/28/17 0142  NA 134* 131*  K 4.4 4.5  CL 102 101  CO2 21* 20*  GLUCOSE 108* 244*  BUN 39* 42*  CREATININE 2.99* 2.94*  CALCIUM 8.9 8.4*  GFRNONAA 18* 18*  GFRAA 21* 21*  ANIONGAP 11 10    Recent Labs  Lab 02/27/17 1338  PROT 7.8  ALBUMIN 3.3*  AST 27  ALT 12*  ALKPHOS 77  BILITOT 1.3*   Hematology Recent Labs  Lab 02/27/17 1338 02/28/17 0142  WBC 20.8* 21.5*  RBC 3.63* 3.23*  HGB 10.7* 9.5*  HCT 34.3* 30.5*  MCV 94.5 94.4  MCH 29.5 29.4  MCHC 31.2 31.1  RDW 15.3 15.4  PLT 200 172   Cardiac Enzymes Recent Labs  Lab 02/27/17 1338 02/27/17 2012  TROPONINI 0.14* 0.13*    Recent Labs  Lab 02/27/17 1344  TROPIPOC 0.17*    BNP Recent Labs  Lab 02/27/17 1338  BNP 1,214.4*    DDimer No results for input(s): DDIMER in the last 168 hours.  Radiology/Studies:  Dg Chest Port 1 View  Result Date: 02/27/2017 CLINICAL DATA:  Shortness of breath.  Right-sided chest pain EXAM: PORTABLE CHEST 1 VIEW COMPARISON:   01/22/2017 FINDINGS: Chronic cardiomegaly. Chronic vascular pedicle widening. Status post CABG and dual-chamber pacer implant. There is a small or moderate right pleural effusion. A small effusion was seen on the right lateral view previously. Generalized interstitial coarsening. No Kerley lines at the peripheral left lung. No pneumothorax. IMPRESSION: 1. Small to moderate right pleural effusion. 2. Interstitial opacity not distinguishable between infection and edema. Electronically Signed   By: Monte Fantasia M.D.   On: 02/27/2017 14:08       Assessment and Plan:   1. Acute on chronic diastolic/systolic heart failure: BNP 1200s. Does not appear to be overloaded on exam (no LE edema, no JVD, no crackles on exam). Possible HF exacerbation in the setting of infection vs. changes to home lasix dosing. Agree with trial of IV lasix. Continue strict I&Os and daily weights. Will reassess in AM to determine if further dosing needed. Will need to monitor Cr closely. Continue home hydralazine and imdur.  2. Tachycardia: Patient has been persistently tachycardic throughout the day today. Unclear if arrhythmia could be contributing to acute decompensation. Will have EP interrogate pacemaker for further evaluation.   3. PNA/COPD exacerbation/ Sepsis: Strep pneumonia + blood cultures. CXR with concerns for PNA vs edema. Continue antibiotics, steroids, and nebulizers per medicine team.   4. CKD: Monitor Cr closely with IV lasix dosing. Cr baseline 2.5-3.0  5. Elevated Troponin: Troponin's flat 0.14>>0.13. Likely demand ischemia in the setting of sepsis.   6. HTN: blood pressures  stable. Continue hydralazine and imdur  7. HLD: Continue home lipitor   For questions or updates, please contact [facility] Please consult [email or web address] for contact info under Cardiology/STEMI.   Signed, Abigail Butts, PA-C  02/28/2017 1:11 PM  [phone]

## 2017-02-28 NOTE — Progress Notes (Addendum)
PHARMACY - PHYSICIAN COMMUNICATION CRITICAL VALUE ALERT - BLOOD CULTURE IDENTIFICATION (BCID)  [name] [name] is an 83 y.o. male who presented to [facility] on 02/27/2017 with a chief complaint of AMS.   Assessment: Patient with suspected PNA per H&P, started on broad spectrum abx. Tmax 99.4, WBC 21.5, LA 1.8. Gram stain with GPCs (3/3 bottles) and BCID reporting strep pneumoniae.   Name of physician (or Provider) Contacted: K. Schorr   Current antibiotics: Vancomycin/Zosyn   Changes to prescribed antibiotics recommended: None at this time, covered by current abx.    Results for orders placed or performed during the hospital encounter of 02/27/17  Blood Culture ID Panel (Reflexed) (Collected: 02/27/2017  2:20 PM)  Result Value Ref Range   Enterococcus species NOT DETECTED NOT DETECTED   Listeria monocytogenes NOT DETECTED NOT DETECTED   Staphylococcus species NOT DETECTED NOT DETECTED   Staphylococcus aureus NOT DETECTED NOT DETECTED   Streptococcus species DETECTED (A) NOT DETECTED   Streptococcus agalactiae NOT DETECTED NOT DETECTED   Streptococcus pneumoniae DETECTED (A) NOT DETECTED   Streptococcus pyogenes NOT DETECTED NOT DETECTED   Acinetobacter baumannii NOT DETECTED NOT DETECTED   Enterobacteriaceae species NOT DETECTED NOT DETECTED   Enterobacter cloacae complex NOT DETECTED NOT DETECTED   Escherichia coli NOT DETECTED NOT DETECTED   Klebsiella oxytoca NOT DETECTED NOT DETECTED   Klebsiella pneumoniae NOT DETECTED NOT DETECTED   Proteus species NOT DETECTED NOT DETECTED   Serratia marcescens NOT DETECTED NOT DETECTED   Haemophilus influenzae NOT DETECTED NOT DETECTED   Neisseria meningitidis NOT DETECTED NOT DETECTED   Pseudomonas aeruginosa NOT DETECTED NOT DETECTED   Candida albicans NOT DETECTED NOT DETECTED   Candida glabrata NOT DETECTED NOT DETECTED   Candida krusei NOT DETECTED NOT DETECTED   Candida parapsilosis NOT DETECTED NOT DETECTED   Candida tropicalis  NOT DETECTED NOT DETECTED   Lavonda Jumbo, PharmD Clinical Pharmacist 02/28/17 4:55 AM

## 2017-02-28 NOTE — Progress Notes (Signed)
Progress Note    [name] [name]  HKV:[id number] DOB: 03-21-1931  DOA: 02/27/2017 PCP: Jani Gravel, MD    Brief Narrative:   Chief complaint: F/U confusion  Medical records reviewed and are as summarized below:  [name] [name] is an 83 y.o. male with a PMH of CAD status post CABG in 2004, bradycardia status post pacemaker, chronic combined CHF, PAD, AAA status post repair, prior CVA, hyperlipidemia, hypertension and tobacco abuse who was admitted 02/27/17 for evaluation of confusion. Currently being treated for sepsis secondary to pneumonia/COPD exacerbation.  Assessment/Plan:   Principal Problem:   Severe sepsis ([place]) secondary to streptococcus pneumoniae pneumonia/bacteremia complicated by COPD exacerbation/lactic acidosis Placed on empiric vancomycin/Zosyn, Solu-Medrol, bronchodilators. Required BiPAP on admission, and remains on supplemental oxygen (does not use oxygen at home). Blood cultures positive for Streptococcus pneumonia, we will narrow antibiotics to ceftriaxone 2 g IV daily. Lactic acid has cleared. Patient remains severely ill with respiratory failure and requires high complexity MDM. No need for Echo per ID (discussed with Dr. Megan Salon), needs 7-10 days of treatment. Can convert to oral Amoxicillin once clinically improving.    Acute respiratory failure, multifactorial with acute on chronic CHF exacerbation contributory Patient does have elevated BNP and chest x-ray findings that could be consistent with CHF with interstitial opacity and right pleural effusion. We'll give 40 mg IV Lasix now.  Active Problems:   Hyponatremia Likely from CHF physiology. Diurese.    History of CAD status post CABG/elevated troponin/PAD status post bifemoral bypass graft  Continue aspirin, Lipitor. Troponin elevation likely from demand ischemia in the setting of sepsis. Trend flat.    Essential hypertension Blood pressure currently stable.    Dyslipidemia Continue Lipitor.   Renal failure, acute on chronic (HCC)/stage III-IV CKD Baseline creatinine does appear to be 2.5-2.9. Current creatinine consistent with usual baseline values.    Obesity Body mass index is 29.8 kg/m.     Neuropathy Continue neurontin.    Type 2 diabetes    MRSA carrier Continue decontamination therapy.    Depression Continue Lexapro.   Family Communication/Anticipated D/C date and plan/Code Status   DVT prophylaxis: Heparin ordered. Code Status: DNR Family Communication: Wife at bedside. Disposition Plan: Home when respiratory status improved.   Medical Consultants:    Cardiology  Telephone consultation with Infectious Disease   Anti-Infectives:    Vancomycin 02/27/17---> 02/28/17  Zosyn 02/27/17---> 02/28/17  Rocephin 02/28/17--->  Subjective:   Breathing better, off Bipap now, but remains on high flow oxygen (5L) with ongoing use of accessory muscles. Appetite OK. Has had some sharp chest pain. Cough is non-productive.  Objective:    Vitals:   02/28/17 0232 02/28/17 0353 02/28/17 0400 02/28/17 0719  BP:  113/83    Pulse: 78 77 85   Resp: (!) 27 17 (!) 29   Temp:  97.7 F (36.5 C)    TempSrc:  Axillary    SpO2: 94% 96% 95% 92%  Weight:  94.2 kg (207 lb 10.8 oz)    Height:        Intake/Output Summary (Last 24 hours) at 02/28/2017 0726 Last data filed at 02/28/2017 0626 Gross per 24 hour  Intake 1691.67 ml  Output -  Net 1691.67 ml   Filed Weights   02/27/17 1333 02/27/17 1817 02/28/17 0353  Weight: 91.6 kg (202 lb) 90.4 kg (199 lb 4.7 oz) 94.2 kg (207 lb 10.8 oz)    Exam: General: Mild respiratory distress. Cardiovascular: Heart sounds show a regular rate,  and rhythm. No gallops or rubs. No murmurs. No JVD. Lungs: Decreased breath sounds with poor air movement. Abdomen: Soft/obese, nontender, nondistended with normal active bowel sounds. No masses. No hepatosplenomegaly. Neurological: Alert and oriented 3. Moves all extremities 4  with equal strength. Cranial nerves II through XII grossly intact. Skin: Warm and dry. No rashes or lesions. Extremities: No clubbing or cyanosis. No edema. Pedal pulses 2+. Psychiatric: Mood and affect are normal. Insight and judgment are normal.   Data Reviewed:   I have personally reviewed following labs and imaging studies:  Labs: Labs show the following:   Basic Metabolic Panel: Recent Labs  Lab 02/27/17 1338 02/28/17 0142  NA 134* 131*  K 4.4 4.5  CL 102 101  CO2 21* 20*  GLUCOSE 108* 244*  BUN 39* 42*  CREATININE 2.99* 2.94*  CALCIUM 8.9 8.4*   GFR Estimated Creatinine Clearance: 21.9 mL/min (A) (by C-G formula based on SCr of 2.94 mg/dL (H)). Liver Function Tests: Recent Labs  Lab 02/27/17 1338  AST 27  ALT 12*  ALKPHOS 77  BILITOT 1.3*  PROT 7.8  ALBUMIN 3.3*    CBC: Recent Labs  Lab 02/27/17 1338 02/28/17 0142  WBC 20.8* 21.5*  HGB 10.7* 9.5*  HCT 34.3* 30.5*  MCV 94.5 94.4  PLT 200 172   Cardiac Enzymes: Recent Labs  Lab 02/27/17 1338 02/27/17 2012  TROPONINI 0.14* 0.13*   BNP (last 3 results) Recent Labs    02/06/17 1302  PROBNP 2,488*   CBG: Recent Labs  Lab 02/28/17 0717  GLUCAP 175*   Sepsis Labs: Recent Labs  Lab 02/27/17 1338 02/27/17 1439 02/27/17 2012 02/28/17 0142  WBC 20.8*  --   --  21.5*  LATICACIDVEN  --  2.54* 3.4* 1.8    Microbiology Recent Results (from the past 240 hour(s))  Blood Culture (routine x 2)     Status: None (Preliminary result)   Collection Time: 02/27/17  1:38 PM  Result Value Ref Range Status   Specimen Description LEFT ANTECUBITAL  Final   Special Requests IN PEDIATRIC BOTTLE Blood Culture adequate volume  Final   Culture  Setup Time   Final    GRAM POSITIVE COCCI IN PEDIATRIC BOTTLE CRITICAL RESULT CALLED TO, READ BACK BY AND VERIFIED WITHRich Fuchs PHARMD [id number] 02/28/17 A BROWNING    Culture PENDING  Incomplete   Report Status PENDING  Incomplete  Blood Culture (routine x 2)      Status: None (Preliminary result)   Collection Time: 02/27/17  2:20 PM  Result Value Ref Range Status   Specimen Description BLOOD RIGHT ANTECUBITAL  Final   Special Requests   Final    BOTTLES DRAWN AEROBIC AND ANAEROBIC Blood Culture adequate volume   Culture  Setup Time   Final    Organism ID to follow GRAM POSITIVE COCCI IN BOTH AEROBIC AND ANAEROBIC BOTTLES CRITICAL RESULT CALLED TO, READ BACK BY AND VERIFIED WITHRich Fuchs EPPIRJ [id number] 02/28/17 A BROWNING    Culture PENDING  Incomplete   Report Status PENDING  Incomplete  Blood Culture ID Panel (Reflexed)     Status: Abnormal   Collection Time: 02/27/17  2:20 PM  Result Value Ref Range Status   Enterococcus species NOT DETECTED NOT DETECTED Final   Listeria monocytogenes NOT DETECTED NOT DETECTED Final   Staphylococcus species NOT DETECTED NOT DETECTED Final   Staphylococcus aureus NOT DETECTED NOT DETECTED Final   Streptococcus species DETECTED (A) NOT DETECTED Final    Comment: CRITICAL  RESULT CALLED TO, READ BACK BY AND VERIFIED WITHRich Fuchs PHARMD 1610 02/28/17 A BROWNING    Streptococcus agalactiae NOT DETECTED NOT DETECTED Final   Streptococcus pneumoniae DETECTED (A) NOT DETECTED Final    Comment: CRITICAL RESULT CALLED TO, READ BACK BY AND VERIFIED WITHRich Fuchs PHARMD [id number] 02/28/17 A BROWNING    Streptococcus pyogenes NOT DETECTED NOT DETECTED Final   Acinetobacter baumannii NOT DETECTED NOT DETECTED Final   Enterobacteriaceae species NOT DETECTED NOT DETECTED Final   Enterobacter cloacae complex NOT DETECTED NOT DETECTED Final   Escherichia coli NOT DETECTED NOT DETECTED Final   Klebsiella oxytoca NOT DETECTED NOT DETECTED Final   Klebsiella pneumoniae NOT DETECTED NOT DETECTED Final   Proteus species NOT DETECTED NOT DETECTED Final   Serratia marcescens NOT DETECTED NOT DETECTED Final   Haemophilus influenzae NOT DETECTED NOT DETECTED Final   Neisseria meningitidis NOT DETECTED NOT DETECTED Final    Pseudomonas aeruginosa NOT DETECTED NOT DETECTED Final   Candida albicans NOT DETECTED NOT DETECTED Final   Candida glabrata NOT DETECTED NOT DETECTED Final   Candida krusei NOT DETECTED NOT DETECTED Final   Candida parapsilosis NOT DETECTED NOT DETECTED Final   Candida tropicalis NOT DETECTED NOT DETECTED Final  MRSA PCR Screening     Status: Abnormal   Collection Time: 02/27/17  6:41 PM  Result Value Ref Range Status   MRSA by PCR POSITIVE (A) NEGATIVE Final    Comment:        The GeneXpert MRSA Assay (FDA approved for NASAL specimens only), is one component of a comprehensive MRSA colonization surveillance program. It is not intended to diagnose MRSA infection nor to guide or monitor treatment for MRSA infections. RESULT CALLED TO, READ BACK BY AND VERIFIED WITH: A DILLARD RN [id number] 02/27/17 A BROWNING     Procedures and diagnostic studies:  Dg Chest Port 1 View  Result Date: 02/27/2017 CLINICAL DATA:  Shortness of breath.  Right-sided chest pain EXAM: PORTABLE CHEST 1 VIEW COMPARISON:  01/22/2017 FINDINGS: Chronic cardiomegaly. Chronic vascular pedicle widening. Status post CABG and dual-chamber pacer implant. There is a small or moderate right pleural effusion. A small effusion was seen on the right lateral view previously. Generalized interstitial coarsening. No Kerley lines at the peripheral left lung. No pneumothorax. IMPRESSION: 1. Small to moderate right pleural effusion. 2. Interstitial opacity not distinguishable between infection and edema. Electronically Signed   By: Monte Fantasia M.D.   On: 02/27/2017 14:08    Medications:   . aspirin EC  81 mg Oral Daily  . atorvastatin  40 mg Oral Daily  . Chlorhexidine Gluconate Cloth  6 each Topical Q0600  . escitalopram  5 mg Oral QPM  . gabapentin  300 mg Oral Daily  . heparin  5,000 Units Subcutaneous Q8H  . ipratropium-albuterol  3 mL Nebulization Q6H  . isosorbide mononitrate  30 mg Oral Daily  . Melatonin  6 mg Oral  QHS  . mupirocin ointment  1 application Nasal BID  . predniSONE  50 mg Oral Q breakfast   Continuous Infusions: . sodium chloride 50 mL/hr at 02/28/17 0400  . piperacillin-tazobactam (ZOSYN)  IV 2.25 g (02/28/17 0626)  . [START ON 03/01/2017] vancomycin       LOS: 1 day   Jacquelynn Cree  [facility] Pager [phone]. If unable to reach me by pager, please call my cell phone at [phone].  *Please refer to [email or web address], password [email or web address] to get updated schedule on who will round  on this patient, as hospitalists switch teams weekly. If 7PM-7AM, please contact night-coverage at [email or web address], password [email or web address] for any overnight needs.  02/28/2017, 7:26 AM

## 2017-02-28 NOTE — Progress Notes (Signed)
MDT PPM interrogated. Pt had atrial tach on 12/23, but has only had SR and ST since then.   Rosaria Ferries, PA-C 02/28/2017 5:47 PM Beeper [phone]

## 2017-03-01 ENCOUNTER — Encounter (HOSPITAL_COMMUNITY): Payer: Self-pay | Admitting: Internal Medicine

## 2017-03-01 ENCOUNTER — Inpatient Hospital Stay (HOSPITAL_COMMUNITY): Payer: Medicare Other

## 2017-03-01 DIAGNOSIS — I340 Nonrheumatic mitral (valve) insufficiency: Secondary | ICD-10-CM

## 2017-03-01 DIAGNOSIS — N179 Acute kidney failure, unspecified: Secondary | ICD-10-CM

## 2017-03-01 DIAGNOSIS — R7881 Bacteremia: Secondary | ICD-10-CM

## 2017-03-01 DIAGNOSIS — N184 Chronic kidney disease, stage 4 (severe): Secondary | ICD-10-CM

## 2017-03-01 DIAGNOSIS — J9600 Acute respiratory failure, unspecified whether with hypoxia or hypercapnia: Secondary | ICD-10-CM

## 2017-03-01 DIAGNOSIS — E871 Hypo-osmolality and hyponatremia: Secondary | ICD-10-CM | POA: Diagnosis present

## 2017-03-01 DIAGNOSIS — B953 Streptococcus pneumoniae as the cause of diseases classified elsewhere: Secondary | ICD-10-CM | POA: Diagnosis present

## 2017-03-01 LAB — CBC
HEMATOCRIT: 31.6 % — AB (ref 39.0–52.0)
Hemoglobin: 9.6 g/dL — ABNORMAL LOW (ref 13.0–17.0)
MCH: 28.6 pg (ref 26.0–34.0)
MCHC: 30.4 g/dL (ref 30.0–36.0)
MCV: 94 fL (ref 78.0–100.0)
Platelets: 234 10*3/uL (ref 150–400)
RBC: 3.36 MIL/uL — ABNORMAL LOW (ref 4.22–5.81)
RDW: 15.2 % (ref 11.5–15.5)
WBC: 21 10*3/uL — ABNORMAL HIGH (ref 4.0–10.5)

## 2017-03-01 LAB — BASIC METABOLIC PANEL
Anion gap: 14 (ref 5–15)
BUN: 61 mg/dL — AB (ref 6–20)
CHLORIDE: 99 mmol/L — AB (ref 101–111)
CO2: 20 mmol/L — AB (ref 22–32)
Calcium: 8.7 mg/dL — ABNORMAL LOW (ref 8.9–10.3)
Creatinine, Ser: 3.45 mg/dL — ABNORMAL HIGH (ref 0.61–1.24)
GFR calc Af Amer: 17 mL/min — ABNORMAL LOW (ref >60)
GFR calc non Af Amer: 15 mL/min — ABNORMAL LOW (ref >60)
GLUCOSE: 147 mg/dL — AB (ref 65–99)
POTASSIUM: 4.8 mmol/L (ref 3.5–5.1)
Sodium: 133 mmol/L — ABNORMAL LOW (ref 135–145)

## 2017-03-01 LAB — GLUCOSE, CAPILLARY
GLUCOSE-CAPILLARY: 133 mg/dL — AB (ref 65–99)
GLUCOSE-CAPILLARY: 154 mg/dL — AB (ref 65–99)

## 2017-03-01 LAB — BRAIN NATRIURETIC PEPTIDE: B Natriuretic Peptide: 1858.9 pg/mL — ABNORMAL HIGH (ref 0.0–100.0)

## 2017-03-01 MED ORDER — METHYLPREDNISOLONE SODIUM SUCC 125 MG IJ SOLR
60.0000 mg | Freq: Three times a day (TID) | INTRAMUSCULAR | Status: DC
  Administered 2017-03-01 – 2017-03-05 (×12): 60 mg via INTRAVENOUS
  Filled 2017-03-01 (×12): qty 2

## 2017-03-01 MED ORDER — INSULIN ASPART 100 UNIT/ML ~~LOC~~ SOLN
0.0000 [IU] | Freq: Three times a day (TID) | SUBCUTANEOUS | Status: DC
  Administered 2017-03-01: 2 [IU] via SUBCUTANEOUS
  Administered 2017-03-02 (×2): 3 [IU] via SUBCUTANEOUS
  Administered 2017-03-02: 2 [IU] via SUBCUTANEOUS
  Administered 2017-03-03: 3 [IU] via SUBCUTANEOUS
  Administered 2017-03-03: 5 [IU] via SUBCUTANEOUS
  Administered 2017-03-03 – 2017-03-05 (×4): 3 [IU] via SUBCUTANEOUS
  Administered 2017-03-06 (×2): 2 [IU] via SUBCUTANEOUS
  Administered 2017-03-07: 3 [IU] via SUBCUTANEOUS
  Administered 2017-03-07: 5 [IU] via SUBCUTANEOUS
  Administered 2017-03-08: 2 [IU] via SUBCUTANEOUS

## 2017-03-01 MED ORDER — INSULIN ASPART 100 UNIT/ML ~~LOC~~ SOLN
0.0000 [IU] | Freq: Every day | SUBCUTANEOUS | Status: DC
  Administered 2017-03-04: 2 [IU] via SUBCUTANEOUS

## 2017-03-01 MED ORDER — HYDRALAZINE HCL 20 MG/ML IJ SOLN
10.0000 mg | Freq: Once | INTRAMUSCULAR | Status: AC
  Administered 2017-03-01: 10 mg via INTRAVENOUS
  Filled 2017-03-01: qty 1

## 2017-03-01 MED ORDER — ALBUTEROL SULFATE (2.5 MG/3ML) 0.083% IN NEBU
5.0000 mg | INHALATION_SOLUTION | RESPIRATORY_TRACT | Status: AC
  Administered 2017-03-01: 5 mg via RESPIRATORY_TRACT

## 2017-03-01 MED ORDER — ALBUTEROL SULFATE (2.5 MG/3ML) 0.083% IN NEBU
5.0000 mg | INHALATION_SOLUTION | Freq: Once | RESPIRATORY_TRACT | Status: AC
Start: 2017-03-01 — End: 2017-03-01

## 2017-03-01 NOTE — Progress Notes (Signed)
  Echocardiogram 2D Echocardiogram has been performed.  Kimmie Doren T Amaani Guilbault 03/01/2017, 3:53 PM

## 2017-03-01 NOTE — Progress Notes (Addendum)
Progress Note  Patient Name: [name] [name] Date of Encounter: 03/01/2017  Primary Cardiologist:.  Ena Dawley  Subjective   Clinical status unchanged.  He said his breathing is better.  No appetite.  Cough but no phlegm production.  Inpatient Medications    Scheduled Meds: . aspirin EC  81 mg Oral Daily  . atorvastatin  40 mg Oral Daily  . Chlorhexidine Gluconate Cloth  6 each Topical Q0600  . escitalopram  5 mg Oral QPM  . gabapentin  300 mg Oral Daily  . heparin  5,000 Units Subcutaneous Q8H  . ipratropium-albuterol  3 mL Nebulization Q6H  . isosorbide mononitrate  30 mg Oral Daily  . mouth rinse  15 mL Mouth Rinse BID  . Melatonin  6 mg Oral QHS  . mupirocin ointment  1 application Nasal BID  . predniSONE  50 mg Oral Q breakfast   Continuous Infusions: . sodium chloride 50 mL/hr at 03/01/17 0737  . cefTRIAXone (ROCEPHIN)  IV Stopped (02/28/17 0853)   PRN Meds: traMADol, zolpidem   Vital Signs    Vitals:   02/28/17 2341 03/01/17 0350 03/01/17 0721 03/01/17 0724  BP: (!) 161/109 (!) 158/81  (!) 162/102  Pulse: (!) 114 62  (!) 112  Resp: (!) 28 (!) 24  (!) 21  Temp: 98.5 F (36.9 C) (!) 97.3 F (36.3 C)  98.1 F (36.7 C)  TempSrc: Oral Axillary  Oral  SpO2: 94% 96% 95% 92%  Weight:  207 lb 0.2 oz (93.9 kg)    Height:        Intake/Output Summary (Last 24 hours) at 03/01/2017 0844 Last data filed at 03/01/2017 0500 Gross per 24 hour  Intake 2300 ml  Output 1675 ml  Net 625 ml   Filed Weights   02/27/17 1817 02/28/17 0353 03/01/17 0350  Weight: 199 lb 4.7 oz (90.4 kg) 207 lb 10.8 oz (94.2 kg) 207 lb 0.2 oz (93.9 kg)    Telemetry    Sinus tachycardia with tracking- Personally Reviewed  ECG    No new data- Personally Reviewed  Physical Exam  Elderly and tachypneic GEN: No acute distress.   Neck: No JVD Cardiac: RRR, no murmurs, rubs, or gallops.  Heart tones are difficult to hear Respiratory:  Coarse rhonchi and upper respiratory  sounds GI: Soft, nontender, non-distended  MS: No edema; No deformity. Neuro:  Nonfocal  Psych: Normal affect   Labs    Chemistry Recent Labs  Lab 02/27/17 1338 02/28/17 0142 03/01/17 0204  NA 134* 131* 133*  K 4.4 4.5 4.8  CL 102 101 99*  CO2 21* 20* 20*  GLUCOSE 108* 244* 147*  BUN 39* 42* 61*  CREATININE 2.99* 2.94* 3.45*  CALCIUM 8.9 8.4* 8.7*  PROT 7.8  --   --   ALBUMIN 3.3*  --   --   AST 27  --   --   ALT 12*  --   --   ALKPHOS 77  --   --   BILITOT 1.3*  --   --   GFRNONAA 18* 18* 15*  GFRAA 21* 21* 17*  ANIONGAP 11 10 14      Hematology Recent Labs  Lab 02/27/17 1338 02/28/17 0142 03/01/17 0204  WBC 20.8* 21.5* 21.0*  RBC 3.63* 3.23* 3.36*  HGB 10.7* 9.5* 9.6*  HCT 34.3* 30.5* 31.6*  MCV 94.5 94.4 94.0  MCH 29.5 29.4 28.6  MCHC 31.2 31.1 30.4  RDW 15.3 15.4 15.2  PLT 200 172 234  Cardiac Enzymes Recent Labs  Lab 02/27/17 1338 02/27/17 2012  TROPONINI 0.14* 0.13*    Recent Labs  Lab 02/27/17 1344  TROPIPOC 0.17*     BNP Recent Labs  Lab 02/27/17 1338  BNP 1,214.4*     DDimer No results for input(s): DDIMER in the last 168 hours.   Radiology    Dg Chest Port 1 View  Result Date: 02/27/2017 CLINICAL DATA:  Shortness of breath.  Right-sided chest pain EXAM: PORTABLE CHEST 1 VIEW COMPARISON:  01/22/2017 FINDINGS: Chronic cardiomegaly. Chronic vascular pedicle widening. Status post CABG and dual-chamber pacer implant. There is a small or moderate right pleural effusion. A small effusion was seen on the right lateral view previously. Generalized interstitial coarsening. No Kerley lines at the peripheral left lung. No pneumothorax. IMPRESSION: 1. Small to moderate right pleural effusion. 2. Interstitial opacity not distinguishable between infection and edema. Electronically Signed   By: Monte Fantasia M.D.   On: 02/27/2017 14:08    Cardiac Studies   No new data  Patient Profile     83 y.o. male  with a PMH of CAD s/p CABG in  2005, PAD s/p aorto-bifem bypass, chronic combined CHF, bradycardia c/b CHB now s/p PPM 09/2016, COPD, CKD stage IV, chronic anemia, AAA s/p repair in 2014, stroke, DM, hyperlipidemia, HTN, tobacco abuse who is being seen today for the evaluation of CHF at the request of Dr. Rockne Menghini.  Acute worsening kidney function with diuresis.   Assessment & Plan    1.  Chronic combined systolic and diastolic heart failure, without gross volume overload.  Worsening in kidney function after single dose of IV Lasix.  Hold further diuretic therapy.  Recheck BNP and repeat echocardiogram (reassess LV function and rule out endocrine). 2.  Acute on chronic kidney injury, stage IV.  Hold further attempts to diurese patient. 3.  Device interrogation indicated that increased heart rate is sinus tachycardia related not arrhythmia. 4.  Streptococcal pneumonia bacteremia. 5.  Acute on chronic respiratory failure, with clinical worsening.   For questions or updates, please contact [place] Please consult [email or web address] for contact info under Cardiology/STEMI.      Signed, Sinclair Grooms, MD  03/01/2017, 8:44 AM

## 2017-03-01 NOTE — Progress Notes (Signed)
Progress Note    [name] [name]  IHK:[id number] DOB: 05-11-1931  DOA: 02/27/2017 PCP: Jani Gravel, MD    Brief Narrative:   Chief complaint: F/U confusion  Medical records reviewed and are as summarized below:  [name] [name] is an 83 y.o. male with a PMH of CAD status post CABG in 2004, bradycardia status post pacemaker, chronic combined CHF, PAD, AAA status post repair, prior CVA, hyperlipidemia, hypertension and tobacco abuse who was admitted 02/27/17 for evaluation of confusion. Currently being treated for sepsis secondary to pneumococcal pneumonia/COPD exacerbation.  Assessment/Plan:   Principal Problem:   Severe sepsis ([place]) secondary to streptococcus pneumoniae pneumonia/bacteremia complicated by COPD exacerbation/lactic acidosis Placed on empiric vancomycin/Zosyn, Solu-Medrol, bronchodilators. Required BiPAP on admission, and remains on supplemental oxygen (does not use oxygen at home). Blood cultures positive for Streptococcus pneumonia, antibiotics narrowed to ceftriaxone 2 g IV daily 02/28/17.  No need for Echo per ID (discussed with Dr. Megan Salon), needs 7-10 days of treatment. Can convert to oral Amoxicillin once clinically improving. Afebrile. WBC remains elevated at 21.    Acute respiratory failure, multifactorial with acute on chronic CHF exacerbation & COPD exacerbation contributory Patient does have elevated BNP and chest x-ray findings that could be consistent with CHF with interstitial opacity and right pleural effusion. Given 40 mg of IV Lasix 02/28/17, but no significant diuresis based on I/O, and no significant weight loss. Creatinine bump also noted, so we'll not give any further Lasix today. Diffuse bronchospasm on exam today. Will give 5 mg Albuterol now and increase steroids. Cardiology following, ordered a 2 D Echo.  Active Problems:   Tachycardia-bradycardia syndrome status post Pacemaker Pacemaker interrogated by cardiology, had an episode of atrial  tachycardia on 02/25/17.    Hyponatremia Likely from CHF physiology versus lung process.    History of CAD status post CABG/elevated troponin/PAD status post bifemoral bypass graft  Continue aspirin, Lipitor. Troponin elevation likely from demand ischemia in the setting of sepsis. Trend flat.    Essential hypertension Blood pressure currently stable.    Dyslipidemia Continue Lipitor.    Renal failure, acute on chronic (HCC)/stage III-IV CKD Baseline creatinine does appear to be 2.5-2.9. Current creatinine elevated over usual baseline values after diuresis attempt yesterday. Hold further diuretics.    Obesity Body mass index is 29.7 kg/m.     Neuropathy Continue neurontin.    Type 2 diabetes Start moderate scale SSI. May need to increase with increased dose steroids.    MRSA carrier Continue decontamination therapy.    Depression Continue Lexapro.   Family Communication/Anticipated D/C date and plan/Code Status   DVT prophylaxis: Heparin ordered. Code Status: DNR Family Communication: Wife at bedside 02/28/17, no family present today. Disposition Plan: Home when respiratory status improved, likely another 2-3 days.   Medical Consultants:    Cardiology  Telephone consultation with Infectious Disease   Anti-Infectives:    Vancomycin 02/27/17---> 02/28/17  Zosyn 02/27/17---> 02/28/17  Rocephin 02/28/17--->  Subjective:   Having labored breathing with use of accessory muscles. Still with mostly non-productive cough. Poor appetite. No chest pain.  Objective:    Vitals:   03/01/17 0724 03/01/17 1052 03/01/17 1231 03/01/17 1423  BP: (!) 162/102 (!) 155/106 (!) 157/107   Pulse: (!) 112 91 (!) 103   Resp: (!) 21 (!) 22 20   Temp: 98.1 F (36.7 C)  97.6 F (36.4 C)   TempSrc: Oral  Axillary   SpO2: 92% 98% 98% 95%  Weight:  Height:        Intake/Output Summary (Last 24 hours) at 03/01/2017 1446 Last data filed at 03/01/2017 0859 Gross per 24  hour  Intake 1980 ml  Output 1675 ml  Net 305 ml   Filed Weights   02/27/17 1817 02/28/17 0353 03/01/17 0350  Weight: 90.4 kg (199 lb 4.7 oz) 94.2 kg (207 lb 10.8 oz) 93.9 kg (207 lb 0.2 oz)    Exam: General: Moderate respiratory distress. Cardiovascular: Heart sounds show a regular rate, and rhythm. No gallops or rubs. No murmurs. No JVD. Lungs: Audible inspiratory and expiratory wheezes with labored breathing and use of abdominal accessory muscles. Unable to speak in complete sentences. Abdomen: Softly distended, nontender, with normal active bowel sounds. No masses. No hepatosplenomegaly. Neurological: Alert and oriented 3. Nonfocal. Skin: Warm and dry. No rashes or lesions. Extremities: No clubbing or cyanosis. Trace edema. Pedal pulses 2+. Psychiatric: Mood and affect are normal. Insight and judgment are normal.  Data Reviewed:   I have personally reviewed following labs and imaging studies:  Labs: Labs show the following:   Basic Metabolic Panel: Recent Labs  Lab 02/27/17 1338 02/28/17 0142 03/01/17 0204  NA 134* 131* 133*  K 4.4 4.5 4.8  CL 102 101 99*  CO2 21* 20* 20*  GLUCOSE 108* 244* 147*  BUN 39* 42* 61*  CREATININE 2.99* 2.94* 3.45*  CALCIUM 8.9 8.4* 8.7*   GFR Estimated Creatinine Clearance: 18.7 mL/min (A) (by C-G formula based on SCr of 3.45 mg/dL (H)). Liver Function Tests: Recent Labs  Lab 02/27/17 1338  AST 27  ALT 12*  ALKPHOS 77  BILITOT 1.3*  PROT 7.8  ALBUMIN 3.3*    CBC: Recent Labs  Lab 02/27/17 1338 02/28/17 0142 03/01/17 0204  WBC 20.8* 21.5* 21.0*  HGB 10.7* 9.5* 9.6*  HCT 34.3* 30.5* 31.6*  MCV 94.5 94.4 94.0  PLT 200 172 234   Cardiac Enzymes: Recent Labs  Lab 02/27/17 1338 02/27/17 2012  TROPONINI 0.14* 0.13*   BNP (last 3 results) Recent Labs    02/06/17 1302  PROBNP 2,488*   CBG: Recent Labs  Lab 02/28/17 0717  GLUCAP 175*   Sepsis Labs: Recent Labs  Lab 02/27/17 1338 02/27/17 1439  02/27/17 1647 02/27/17 2012 02/28/17 0142 03/01/17 0204  WBC 20.8*  --   --   --  21.5* 21.0*  LATICACIDVEN  --  2.54* 2.42* 3.4* 1.8  --     Microbiology Recent Results (from the past 240 hour(s))  Blood Culture (routine x 2)     Status: Abnormal (Preliminary result)   Collection Time: 02/27/17  1:38 PM  Result Value Ref Range Status   Specimen Description LEFT ANTECUBITAL  Final   Special Requests IN PEDIATRIC BOTTLE Blood Culture adequate volume  Final   Culture  Setup Time   Final    GRAM POSITIVE COCCI IN PEDIATRIC BOTTLE CRITICAL RESULT CALLED TO, READ BACK BY AND VERIFIED WITHRich Fuchs PHARMD [id number] 02/28/17 A BROWNING    Culture (A)  Final    STREPTOCOCCUS PNEUMONIAE SUSCEPTIBILITIES TO FOLLOW    Report Status PENDING  Incomplete  Blood Culture (routine x 2)     Status: Abnormal (Preliminary result)   Collection Time: 02/27/17  2:20 PM  Result Value Ref Range Status   Specimen Description BLOOD RIGHT ANTECUBITAL  Final   Special Requests   Final    BOTTLES DRAWN AEROBIC AND ANAEROBIC Blood Culture adequate volume   Culture  Setup Time   Final  GRAM POSITIVE COCCI IN BOTH AEROBIC AND ANAEROBIC BOTTLES CRITICAL RESULT CALLED TO, READ BACK BY AND VERIFIED WITHRich Fuchs PHARMD [id number] 02/28/17 A BROWNING    Culture (A)  Final    STREPTOCOCCUS PNEUMONIAE SUSCEPTIBILITIES TO FOLLOW    Report Status PENDING  Incomplete  Blood Culture ID Panel (Reflexed)     Status: Abnormal   Collection Time: 02/27/17  2:20 PM  Result Value Ref Range Status   Enterococcus species NOT DETECTED NOT DETECTED Final   Listeria monocytogenes NOT DETECTED NOT DETECTED Final   Staphylococcus species NOT DETECTED NOT DETECTED Final   Staphylococcus aureus NOT DETECTED NOT DETECTED Final   Streptococcus species DETECTED (A) NOT DETECTED Final    Comment: CRITICAL RESULT CALLED TO, READ BACK BY AND VERIFIED WITHRich Fuchs PHARMD [id number] 02/28/17 A BROWNING    Streptococcus agalactiae NOT  DETECTED NOT DETECTED Final   Streptococcus pneumoniae DETECTED (A) NOT DETECTED Final    Comment: CRITICAL RESULT CALLED TO, READ BACK BY AND VERIFIED WITHRich Fuchs PHARMD [id number] 02/28/17 A BROWNING    Streptococcus pyogenes NOT DETECTED NOT DETECTED Final   Acinetobacter baumannii NOT DETECTED NOT DETECTED Final   Enterobacteriaceae species NOT DETECTED NOT DETECTED Final   Enterobacter cloacae complex NOT DETECTED NOT DETECTED Final   Escherichia coli NOT DETECTED NOT DETECTED Final   Klebsiella oxytoca NOT DETECTED NOT DETECTED Final   Klebsiella pneumoniae NOT DETECTED NOT DETECTED Final   Proteus species NOT DETECTED NOT DETECTED Final   Serratia marcescens NOT DETECTED NOT DETECTED Final   Haemophilus influenzae NOT DETECTED NOT DETECTED Final   Neisseria meningitidis NOT DETECTED NOT DETECTED Final   Pseudomonas aeruginosa NOT DETECTED NOT DETECTED Final   Candida albicans NOT DETECTED NOT DETECTED Final   Candida glabrata NOT DETECTED NOT DETECTED Final   Candida krusei NOT DETECTED NOT DETECTED Final   Candida parapsilosis NOT DETECTED NOT DETECTED Final   Candida tropicalis NOT DETECTED NOT DETECTED Final  MRSA PCR Screening     Status: Abnormal   Collection Time: 02/27/17  6:41 PM  Result Value Ref Range Status   MRSA by PCR POSITIVE (A) NEGATIVE Final    Comment:        The GeneXpert MRSA Assay (FDA approved for NASAL specimens only), is one component of a comprehensive MRSA colonization surveillance program. It is not intended to diagnose MRSA infection nor to guide or monitor treatment for MRSA infections. RESULT CALLED TO, READ BACK BY AND VERIFIED WITH: A DILLARD RN [phone] 02/27/17 A BROWNING     Procedures and diagnostic studies:  No results found.  Medications:   . aspirin EC  81 mg Oral Daily  . atorvastatin  40 mg Oral Daily  . Chlorhexidine Gluconate Cloth  6 each Topical Q0600  . escitalopram  5 mg Oral QPM  . gabapentin  300 mg Oral Daily  .  heparin  5,000 Units Subcutaneous Q8H  . ipratropium-albuterol  3 mL Nebulization Q6H  . isosorbide mononitrate  30 mg Oral Daily  . mouth rinse  15 mL Mouth Rinse BID  . Melatonin  6 mg Oral QHS  . methylPREDNISolone (SOLU-MEDROL) injection  60 mg Intravenous Q8H  . mupirocin ointment  1 application Nasal BID   Continuous Infusions: . sodium chloride 50 mL/hr at 03/01/17 0737  . cefTRIAXone (ROCEPHIN)  IV Stopped (03/01/17 0930)     LOS: 2 days   Jacquelynn Cree  [facility] Pager [phone]. If unable to reach me by pager,  please call my cell phone at [phone].  *Please refer to [email or web address], password [email or web address] to get updated schedule on who will round on this patient, as hospitalists switch teams weekly. If 7PM-7AM, please contact night-coverage at [email or web address], password [email or web address] for any overnight needs.  03/01/2017, 2:46 PM

## 2017-03-02 ENCOUNTER — Inpatient Hospital Stay (HOSPITAL_COMMUNITY): Payer: Medicare Other

## 2017-03-02 DIAGNOSIS — N184 Chronic kidney disease, stage 4 (severe): Secondary | ICD-10-CM

## 2017-03-02 DIAGNOSIS — Z794 Long term (current) use of insulin: Secondary | ICD-10-CM

## 2017-03-02 DIAGNOSIS — D631 Anemia in chronic kidney disease: Secondary | ICD-10-CM

## 2017-03-02 DIAGNOSIS — J9601 Acute respiratory failure with hypoxia: Secondary | ICD-10-CM

## 2017-03-02 DIAGNOSIS — I248 Other forms of acute ischemic heart disease: Secondary | ICD-10-CM

## 2017-03-02 DIAGNOSIS — E1159 Type 2 diabetes mellitus with other circulatory complications: Secondary | ICD-10-CM

## 2017-03-02 DIAGNOSIS — N189 Chronic kidney disease, unspecified: Secondary | ICD-10-CM

## 2017-03-02 DIAGNOSIS — N179 Acute kidney failure, unspecified: Secondary | ICD-10-CM

## 2017-03-02 LAB — CBC
HCT: 30.9 % — ABNORMAL LOW (ref 39.0–52.0)
HEMOGLOBIN: 9.9 g/dL — AB (ref 13.0–17.0)
MCH: 29.4 pg (ref 26.0–34.0)
MCHC: 32 g/dL (ref 30.0–36.0)
MCV: 91.7 fL (ref 78.0–100.0)
Platelets: 284 10*3/uL (ref 150–400)
RBC: 3.37 MIL/uL — AB (ref 4.22–5.81)
RDW: 15 % (ref 11.5–15.5)
WBC: 15.7 10*3/uL — AB (ref 4.0–10.5)

## 2017-03-02 LAB — GLUCOSE, CAPILLARY
GLUCOSE-CAPILLARY: 156 mg/dL — AB (ref 65–99)
GLUCOSE-CAPILLARY: 158 mg/dL — AB (ref 65–99)
Glucose-Capillary: 144 mg/dL — ABNORMAL HIGH (ref 65–99)
Glucose-Capillary: 146 mg/dL — ABNORMAL HIGH (ref 65–99)

## 2017-03-02 LAB — CULTURE, BLOOD (ROUTINE X 2)
SPECIAL REQUESTS: ADEQUATE
Special Requests: ADEQUATE

## 2017-03-02 LAB — BASIC METABOLIC PANEL
ANION GAP: 16 — AB (ref 5–15)
BUN: 78 mg/dL — ABNORMAL HIGH (ref 6–20)
CO2: 17 mmol/L — ABNORMAL LOW (ref 22–32)
Calcium: 8.6 mg/dL — ABNORMAL LOW (ref 8.9–10.3)
Chloride: 101 mmol/L (ref 101–111)
Creatinine, Ser: 3.65 mg/dL — ABNORMAL HIGH (ref 0.61–1.24)
GFR, EST AFRICAN AMERICAN: 16 mL/min — AB (ref >60)
GFR, EST NON AFRICAN AMERICAN: 14 mL/min — AB (ref >60)
Glucose, Bld: 162 mg/dL — ABNORMAL HIGH (ref 65–99)
POTASSIUM: 4.3 mmol/L (ref 3.5–5.1)
SODIUM: 134 mmol/L — AB (ref 135–145)

## 2017-03-02 LAB — ECHOCARDIOGRAM COMPLETE
Height: 70 in
Weight: 3312.19 oz

## 2017-03-02 MED ORDER — FUROSEMIDE 10 MG/ML IJ SOLN
40.0000 mg | Freq: Two times a day (BID) | INTRAMUSCULAR | Status: DC
  Administered 2017-03-02: 40 mg via INTRAVENOUS
  Filled 2017-03-02: qty 4

## 2017-03-02 MED ORDER — ONDANSETRON HCL 4 MG/2ML IJ SOLN
4.0000 mg | Freq: Four times a day (QID) | INTRAMUSCULAR | Status: DC | PRN
  Administered 2017-03-02 – 2017-03-05 (×2): 4 mg via INTRAVENOUS
  Filled 2017-03-02 (×2): qty 2

## 2017-03-02 MED ORDER — MORPHINE SULFATE (PF) 2 MG/ML IV SOLN
2.0000 mg | Freq: Once | INTRAVENOUS | Status: AC
  Administered 2017-03-02: 2 mg via INTRAVENOUS
  Filled 2017-03-02: qty 1

## 2017-03-02 MED ORDER — IPRATROPIUM-ALBUTEROL 0.5-2.5 (3) MG/3ML IN SOLN
3.0000 mL | RESPIRATORY_TRACT | Status: DC | PRN
  Administered 2017-03-02: 3 mL via RESPIRATORY_TRACT
  Filled 2017-03-02: qty 3

## 2017-03-02 MED ORDER — PROMETHAZINE HCL 25 MG/ML IJ SOLN
12.5000 mg | Freq: Four times a day (QID) | INTRAMUSCULAR | Status: DC | PRN
  Administered 2017-03-02: 12.5 mg via INTRAVENOUS
  Filled 2017-03-02: qty 1

## 2017-03-02 NOTE — Consult Note (Signed)
[facility] Consult Note     Date: 03/02/2017                  Patient Name:  [name] [name]  MRN: [id number]  DOB: May 05, 1931  Age / Sex: 83 y.o., male         PCP: Jani Gravel, MD                 Service Requesting Consult: Cardiology, Dr. Julianne Handler                 Reason for Consult: Acute kidney injury superimposed on CKD            Chief Complaint:  Dyspnea  HPI: Pt is an 89M with a PMH significant for COPD, combined systolic and diastolic CHF, CAD, PVD, s/p PPM, CKD followed by Dr. Marval Regal, obesity, and DM II who is now seen in consultation at the request of Dr. Julianne Handler for evaluation and recommendations surrounding AKI on CKD IV.    Briefly, pt started having pleuritic CP and AMS on Christmas Eve and was brought to the hospital Christmas Day.  Found to have RLL pneumonia, acute hypoxic RF, and S. pneumo bacteremia.  He also had acute on chronic CHF exacerbation and AKI on CKD.  He was initially given Lasix and his creatinine rose a bit so was given 1L IVF yesterday without improvement in Cr.    Today has increased WOB and is vol up.  Weights are up as well.  We are asked to see.    Pt feels "terrible".  He doesn't think he's getting better and so is quite discouraged.  Coughing but not bringing anything up.  Thinks the BiPap has helped some.  Last Cr 2.44 02/14/17.  Was 2.99 on admission and has risen to 3.65 today.   Past Medical History:  Diagnosis Date  . AAA (abdominal aortic aneurysm) [facility] [place])    Surgical repair June, 2014  . Acute urinary retention 04/09/2013  . CAD (coronary artery disease)    a. s/p CABG in 2005 x 4. b. low risk nuc 2016.  . Carotid artery occlusion   . Chronic anemia   . Chronic combined systolic and diastolic CHF (congestive heart failure) ([place])   . Chronic renal insufficiency   . CKD (chronic kidney disease), stage III ([place])   . Closed displaced fracture of right femoral neck ([place]) 04/08/2013  . Complete heart block ([place])    . COPD (chronic obstructive pulmonary disease) ([place]) 08/15/2012  . CVA (cerebral vascular accident) ([place]) ~ 2003   residual R leg weakness (01/22/2017)  . Dyslipidemia   . Fall at home 04/09/2013   Fx Right Hip  . HTN (hypertension)   . Hx of CABG    2005  . Morbid obesity ([place])   . MRSA carrier 02/28/2017  . Myocardial infarction [facility])    "was told I had one before I had my heart bypass" (01/22/2017)  . Neuropathy    Right foot  . Peripheral vascular disease ([place])    a. s/p aorto-bifem bypass.  . Presence of permanent cardiac pacemaker   . S/P placement of cardiac pacemaker    a. [employer] 09/2016.  . Status post abdominal aortic aneurysm repair    June, 2014  . Tobacco abuse    quit 2014  . Type II diabetes mellitus ([place])     Past Surgical History:  Procedure Laterality Date  . ABDOMINAL AORTAGRAM N/A 08/07/2012   Procedure: ABDOMINAL AORTAGRAM;  Surgeon: Rosetta Posner, MD;  Location: [facility] CATH LAB;  Service: Cardiovascular;  Laterality: N/A;  . AORTA - BILATERAL FEMORAL ARTERY BYPASS GRAFT N/A 08/09/2012   Procedure: AORTA BIFEMORAL BYPASS GRAFT;  Surgeon: Rosetta Posner, MD;  Location: [place];  Service: Vascular;  Laterality: N/A;  . CORONARY ARTERY BYPASS GRAFT  2005   CABG X"5"  . HIP ARTHROPLASTY Right 04/09/2013   Procedure: ARTHROPLASTY BIPOLAR HIP;  Surgeon: Gearlean Alf, MD;  Location: [facility] ORS;  Service: Orthopedics;  Laterality: Right;  . INSERT / REPLACE / REMOVE PACEMAKER    . JOINT REPLACEMENT    . LUMBAR DISC SURGERY  1977   Lumbar HNP surgery  . PACEMAKER IMPLANT N/A 09/11/2016   Procedure: Pacemaker Implant;  Surgeon: Deboraha Sprang, MD;  Location: [facility] CV LAB;  Service: Cardiovascular;  Laterality: N/A;  . TONSILLECTOMY  1940    Family History  Problem Relation Age of Onset  . Tuberculosis Mother 1  . Coronary artery disease Father   . Varicose Veins Father    Social History:  reports that he quit smoking about 4 years ago. His smoking use included  cigarettes. He has a 50.00 pack-year smoking history. he has never used smokeless tobacco. He reports that he drinks alcohol. He reports that he does not use drugs.  Allergies: No Known Allergies  Medications Prior to Admission  Medication Sig Dispense Refill  . albuterol (PROVENTIL) (2.5 MG/3ML) 0.083% nebulizer solution Take 3 mLs (2.5 mg total) by nebulization every 4 (four) hours as needed for wheezing or shortness of breath. 75 mL 12  . ANORO ELLIPTA 62.5-25 MCG/INH AEPB Inhale 1 puff into the lungs every morning.     Marland Kitchen aspirin 81 MG tablet Take 81 mg by mouth every morning.     Marland Kitchen atorvastatin (LIPITOR) 40 MG tablet Take 40 mg by mouth every morning.     . Chlorphen-Phenyleph-APAP (CORICIDIN D COLD/FLU/SINUS) 2-5-325 MG TABS Take 2 tablets by mouth daily.    . cholecalciferol (VITAMIN D) 1000 UNITS tablet Take 1,000 Units by mouth every morning.     . escitalopram (LEXAPRO) 5 MG tablet Take 5 mg by mouth every evening.     . furosemide (LASIX) 40 MG tablet Take 1 tablet (40 mg total) by mouth daily. (Patient taking differently: Take 20 mg by mouth daily. ) 30 tablet 11  . gabapentin (NEURONTIN) 300 MG capsule Take 300 mg by mouth every morning.     . hydrALAZINE (APRESOLINE) 50 MG tablet Take 1 tablet (50 mg total) by mouth 3 (three) times daily. 90 tablet 6  . isosorbide mononitrate (IMDUR) 30 MG 24 hr tablet Take 1 tablet (30 mg total) by mouth daily. 90 tablet 3  . Melatonin 5 MG TABS Take 1 tablet by mouth at bedtime.    . Mometasone Furoate (ASMANEX HFA) 100 MCG/ACT AERO Inhale 2 puffs into the lungs daily. (Patient taking differently: Inhale 2 puffs into the lungs at bedtime. ) 1 Inhaler 0  . montelukast (SINGULAIR) 10 MG tablet Take 10 mg by mouth every evening.     Marland Kitchen oxybutynin (DITROPAN-XL) 5 MG 24 hr tablet Take 5 mg by mouth at bedtime.     . potassium chloride (K-DUR) 10 MEQ tablet Take 1 tablet (10 mEq total) by mouth daily. 30 tablet 11  . PROAIR HFA 108 (90 Base) MCG/ACT  inhaler Inhale 2 puffs into the lungs every 4 (four) hours as needed for wheezing or shortness of breath. 1 Inhaler  6  . TRADJENTA 5 MG TABS tablet Take 5 mg by mouth every morning.    . traMADol (ULTRAM) 50 MG tablet Take 1 tablet by mouth every 6 (six) hours as needed for moderate pain.     . vitamin B-12 (CYANOCOBALAMIN) 100 MCG tablet Take 100 mcg by mouth every morning.     . zolpidem (AMBIEN) 10 MG tablet Take 5 mg by mouth at bedtime.     . Mometasone Furoate (ASMANEX HFA) 100 MCG/ACT AERO Inhale 2 puffs into the lungs daily. (Patient not taking: Reported on 02/27/2017) 1 Inhaler 5    Results for orders placed or performed during the hospital encounter of 02/27/17 (from the past 48 hour(s))  Basic metabolic panel     Status: Abnormal   Collection Time: 03/01/17  2:04 AM  Result Value Ref Range   Sodium 133 (L) 135 - 145 mmol/L   Potassium 4.8 3.5 - 5.1 mmol/L   Chloride 99 (L) 101 - 111 mmol/L   CO2 20 (L) 22 - 32 mmol/L   Glucose, Bld 147 (H) 65 - 99 mg/dL   BUN 61 (H) 6 - 20 mg/dL   Creatinine, Ser 3.45 (H) 0.61 - 1.24 mg/dL   Calcium 8.7 (L) 8.9 - 10.3 mg/dL   GFR calc non Af Amer 15 (L) >60 mL/min   GFR calc Af Amer 17 (L) >60 mL/min    Comment: (NOTE) The eGFR has been calculated using the CKD EPI equation. This calculation has not been validated in all clinical situations. eGFR's persistently <60 mL/min signify possible Chronic Kidney Disease.    Anion gap 14 5 - 15  CBC     Status: Abnormal   Collection Time: 03/01/17  2:04 AM  Result Value Ref Range   WBC 21.0 (H) 4.0 - 10.5 K/uL   RBC 3.36 (L) 4.22 - 5.81 MIL/uL   Hemoglobin 9.6 (L) 13.0 - 17.0 g/dL   HCT 31.6 (L) 39.0 - 52.0 %   MCV 94.0 78.0 - 100.0 fL   MCH 28.6 26.0 - 34.0 pg   MCHC 30.4 30.0 - 36.0 g/dL   RDW 15.2 11.5 - 15.5 %   Platelets 234 150 - 400 K/uL  Brain natriuretic peptide     Status: Abnormal   Collection Time: 03/01/17 10:07 AM  Result Value Ref Range   B Natriuretic Peptide 1,858.9 (H)  0.0 - 100.0 pg/mL  Glucose, capillary     Status: Abnormal   Collection Time: 03/01/17  3:59 PM  Result Value Ref Range   Glucose-Capillary 133 (H) 65 - 99 mg/dL  Glucose, capillary     Status: Abnormal   Collection Time: 03/01/17  9:30 PM  Result Value Ref Range   Glucose-Capillary 154 (H) 65 - 99 mg/dL  Basic metabolic panel     Status: Abnormal   Collection Time: 03/02/17  3:24 AM  Result Value Ref Range   Sodium 134 (L) 135 - 145 mmol/L   Potassium 4.3 3.5 - 5.1 mmol/L   Chloride 101 101 - 111 mmol/L   CO2 17 (L) 22 - 32 mmol/L   Glucose, Bld 162 (H) 65 - 99 mg/dL   BUN 78 (H) 6 - 20 mg/dL   Creatinine, Ser 3.65 (H) 0.61 - 1.24 mg/dL   Calcium 8.6 (L) 8.9 - 10.3 mg/dL   GFR calc non Af Amer 14 (L) >60 mL/min   GFR calc Af Amer 16 (L) >60 mL/min    Comment: (NOTE) The eGFR has been calculated  using the CKD EPI equation. This calculation has not been validated in all clinical situations. eGFR's persistently <60 mL/min signify possible Chronic Kidney Disease.    Anion gap 16 (H) 5 - 15  CBC     Status: Abnormal   Collection Time: 03/02/17  7:58 AM  Result Value Ref Range   WBC 15.7 (H) 4.0 - 10.5 K/uL   RBC 3.37 (L) 4.22 - 5.81 MIL/uL   Hemoglobin 9.9 (L) 13.0 - 17.0 g/dL   HCT 30.9 (L) 39.0 - 52.0 %   MCV 91.7 78.0 - 100.0 fL   MCH 29.4 26.0 - 34.0 pg   MCHC 32.0 30.0 - 36.0 g/dL   RDW 15.0 11.5 - 15.5 %   Platelets 284 150 - 400 K/uL  Glucose, capillary     Status: Abnormal   Collection Time: 03/02/17  8:19 AM  Result Value Ref Range   Glucose-Capillary 156 (H) 65 - 99 mg/dL   Comment 1 Notify RN    Comment 2 Document in Chart   Glucose, capillary     Status: Abnormal   Collection Time: 03/02/17 12:10 PM  Result Value Ref Range   Glucose-Capillary 144 (H) 65 - 99 mg/dL   No results found.  ROS : all other systems reviewed and are negative.  Blood pressure (!) 152/110, pulse 71, temperature (!) 97.4 F (36.3 C), temperature source Oral, resp. rate (!) 25,  height 5' 10"  (1.778 m), weight 96.6 kg (212 lb 15.4 oz), SpO2 97 %. Physical Exam   GEN: older gentleman, in some distress, lying in bed HEENT EOMI, sclerae anicteric NECK + JVD PULM increased WOB, diffuse expiratory wheezes, crackles and muffled BS R side CV irregular, soft systolic murmur ABD protuberant, obese. EXT 1+ LE edema NEURO AAO x 3 no asterixis SKIN pale  Assessment/Plan  1.  AKI superimposed on CKD IV:  Baseline Cr 2.4.  Now with AKI which is nonoliguric.  Clearly pt is vol overloaded and we should attempt to get some off.  I'll start with Lasix 40 IV BID.  Check kidney ultrasound to make sure not superimposed obstructive uropathy (h/o obstruction per chart).  Discussed dialysis briefly with pt and wife.  He's chronically ill and overall a marginal candidate- pt stated that dialysis would just "be prolonging death" but we didn't really make any final decisions regarding its use.  I think reasonable to see if diuresis is successful and then go from there.    2.  Acute on chronic systolic and diastolic CHF: Lasix as above.  Cardiology following.  3.  Strep pneumo pneumonia and acute hypoxic RF: per primary.  On ceftriaxone and continuing to intermittently require BiPaP.  4.  COPD exacerbation: on Solumedrol/ nebs/ #3 as above   Madelon Lips, MD [facility] pgr [phone] 03/02/2017, 2:41 PM

## 2017-03-02 NOTE — Progress Notes (Signed)
PROGRESS NOTE  [name] [name] KGU:[id number] DOB: 07-29-1931 DOA: 02/27/2017 PCP: Jani Gravel, MD Dr. Chase Caller  Brief Narrative: 51yom presented with SOB, admitted for sepsis, pneumonia, COPD exacerbation. Found to have Strep pneumo bacteremia/sepsis. Hospitalization complicated by acute resp failure requiring intermittent BiPAP, acute CHF and AKI worsening.  Assessment/Plan Strep pneumonia sepsis, bacteremia, lobar (right lower) pneumonia with right pleural effusion with associated acute hypoxic resp failure. Flu negative. - afebrile, hemodynamics stable but resp status not. Still requiring intermittent BiPAP, has increased WOB - continue ceftriaxone, can convert to amoxicillin when improved - continue steroids  - repeat CXR 12/29  COPD exacerbation - still with diffuse bronchospasm. Continue steroids, nebs, oxygen, BiPAP.  Acute on chronic systolic/diastolic CHF with demand ischemia. Dry weight 205 pounds per cardiology (02/16/17).  PMH CAD s/p CABG 2004; bradycardia s/p pacemaker; stroke with residual right-sided deficits - difficult situation. I/O positive, was on IVF for AKI. Stop IVF. Will defer to cardiology on consult to AHF vs nephrology. - continue ASA, atorvastatin  AKI superimposed on CKD stage IV - worsening BUN/creatinine. As above, cardiology considered AHF vs nephrology involvement.  Anemia of CKD - stable  DM type 2 on oral meds - continue SSI, gabapentin   DVT prophylaxis: heparin Code Status: DNR Family Communication: none Disposition Plan: pending    Murray Hodgkins, MD  [facility] Direct contact: [phone] --[place] [place]  --[email or web address]; password [email or web address]  7PM-7AM contact night coverage as above 03/02/2017, 10:55 AM  LOS: 3 days   Consultants:  Cardiology   Procedures:    Antimicrobials:  Vancomycin 02/27/17---> 02/28/17  Zosyn 02/27/17---> 02/28/17  Rocephin 02/28/17--->  Interval history/Subjective: RN reported  labored breathing and accessory muscle use. Patient feels breathing is better, no real complaints except chest congestion. Not on home oxygen. Former smoker.  Objective: Vitals:  Vitals:   03/02/17 0821 03/02/17 1037  BP: (!) 162/111   Pulse: 65   Resp: (!) 21   Temp: 97.8 F (36.6 C)   SpO2: 98% 98%    Exam:  Constitutional:  . Appears calm and comfortable Eyes:  . pupils and irises appear normal . Normal lids   ENMT:  . grossly normal hearing  . Lips appear normal Respiratory:  . Diffuse wheeze, L >> R. Fair air movement, no r/r.  Marland Kitchen Respiratory effort mild-moderate increased. Abdominal breathing noted. Cardiovascular:  . RRR, no m/r/g . No LE extremity edema   Skin:  . No rashes, lesions, ulcers . palpation of skin: no induration or nodules Psychiatric:  . Mental status o Mood, affect appropriate . judgement and insight appear normal     I have personally reviewed the following:  Filed Weights   02/28/17 0353 03/01/17 0350 03/02/17 0346  Weight: 94.2 kg (207 lb 10.8 oz) 93.9 kg (207 lb 0.2 oz) 96.6 kg (212 lb 15.4 oz)   Weight change: 2.7 kg (5 lb 15.2 oz)  UOP: 950 I/O since admission: _2.8 L Last BM charted: 12/27 Foley: no Telemetry: paced  Status: SDU  Labs:  Creatinine 2.94 >> 3.45 >> 3.65  WBC 21 >> 15.7  Hgb stable 9.9  Scheduled Meds: . aspirin EC  81 mg Oral Daily  . atorvastatin  40 mg Oral Daily  . Chlorhexidine Gluconate Cloth  6 each Topical Q0600  . escitalopram  5 mg Oral QPM  . gabapentin  300 mg Oral Daily  . heparin  5,000 Units Subcutaneous Q8H  . insulin aspart  0-15 Units Subcutaneous TID WC  . insulin aspart  0-5 Units Subcutaneous QHS  . ipratropium-albuterol  3 mL Nebulization Q6H  . isosorbide mononitrate  30 mg Oral Daily  . mouth rinse  15 mL Mouth Rinse BID  . Melatonin  6 mg Oral QHS  . methylPREDNISolone (SOLU-MEDROL) injection  60 mg Intravenous Q8H  . mupirocin ointment  1 application Nasal BID    Continuous Infusions: . cefTRIAXone (ROCEPHIN)  IV Stopped (03/02/17 0900)    Principal Problem:   Bacteremia due to Streptococcus pneumoniae Active Problems:   Essential hypertension   Hx of CABG   CAD (coronary artery disease)   COPD with acute exacerbation (HCC)   PAD (peripheral artery disease)--bifemoral bypass graft.   DM2 (diabetes mellitus, type 2) (HCC)   Renal failure, acute on chronic (HCC)   S/P placement of cardiac pacemaker   Acute on chronic combined systolic and diastolic CHF (congestive heart failure) (HCC)   Presence of permanent cardiac pacemaker   Pneumonia   Severe sepsis (HCC)   MRSA carrier   Acute hypoxemic respiratory failure (HCC)   CKD (chronic kidney disease), stage IV (HCC)   AKI (acute kidney injury) ([place])   Anemia in chronic kidney disease (CKD)   Demand ischemia ([place])   LOS: 3 days

## 2017-03-02 NOTE — Progress Notes (Signed)
Increased WOB noted intermittently with RR 17-42.  Pt denies any SOB and states "I feel fine."  O2 saturations noted to be 96-98% on BiPap.  Lamar Blinks, NP notified.  Orders received for IV morphine.  Morphine given per order.

## 2017-03-02 NOTE — Care Management Note (Signed)
Case Management Note  Patient Details  Name: [name] [name] MRN: [id number] Date of Birth: 23-Nov-1931  Subjective/Objective:    59yom presented with SOB, admitted for sepsis, pneumonia, COPD exacerbation. Found to have Strep pneumo bacteremia/sepsis. Hospitalization complicated by acute resp failure requiring intermittent BiPAP, acute CHF and AKI.  PTA, pt resided at home with spouse.                   Action/Plan: Pt currently requiring intermittent BIPAP.  Recommend PT/OT consults when able to tolerate therapies.  Will follow for dc planning as pt progresses.    Expected Discharge Date:             Expected Discharge Plan:     In-House Referral:     Discharge planning Services  CM Consult  Post Acute Care Choice:    Choice offered to:     DME Arranged:    DME Agency:     HH Arranged:    HH Agency:     Status of Service:  In process, will continue to follow  If discussed at Long Length of Stay Meetings, dates discussed:    Additional Comments:  Reinaldo Raddle, RN, BSN  Trauma/Neuro ICU Case Manager [phone]

## 2017-03-02 NOTE — Progress Notes (Signed)
Pt with increasing confusion and lethargy.  Pt confused to time and situation.  CBG completed per protocol.  CBG 146.  VSS-see Mining engineer.  Pt. States "I feel ok, my breathing is better."  Pt denies increased WOB, worsening SOB. Pt continues to c/o nausea not relieved by zofran.  Jeannette Corpus, NP notified.  No orders received at this time.

## 2017-03-02 NOTE — Progress Notes (Signed)
Pt WOB noted to increase with new onset fine crackles noted to bilateral lung bases.  Pt oxygen saturation on 4L [place] 89-95% w/ RR 28-32.   Pt agreeable to bipap at this time.  RT to bedside to evaluate patient and place patient on bipap.  Lamar Blinks, NP notified.  No additional orders received.

## 2017-03-02 NOTE — Progress Notes (Signed)
Progress Note  Patient Name: [name] [name] Date of Encounter: 03/02/2017  Primary Cardiologist:.  [name] [name]  Subjective   Clinical status unchanged.  He said his breathing is better.  No appetite.  Cough but no phlegm production.  Inpatient Medications    Scheduled Meds: . aspirin EC  81 mg Oral Daily  . atorvastatin  40 mg Oral Daily  . Chlorhexidine Gluconate Cloth  6 each Topical Q0600  . escitalopram  5 mg Oral QPM  . gabapentin  300 mg Oral Daily  . heparin  5,000 Units Subcutaneous Q8H  . insulin aspart  0-15 Units Subcutaneous TID WC  . insulin aspart  0-5 Units Subcutaneous QHS  . ipratropium-albuterol  3 mL Nebulization Q6H  . isosorbide mononitrate  30 mg Oral Daily  . mouth rinse  15 mL Mouth Rinse BID  . Melatonin  6 mg Oral QHS  . methylPREDNISolone (SOLU-MEDROL) injection  60 mg Intravenous Q8H  . mupirocin ointment  1 application Nasal BID   Continuous Infusions: . cefTRIAXone (ROCEPHIN)  IV Stopped (03/02/17 0900)   PRN Meds: ipratropium-albuterol, traMADol, zolpidem   Vital Signs    Vitals:   03/02/17 0411 03/02/17 0733 03/02/17 0750 03/02/17 0821  BP:   (!) 166/107 (!) 162/111  Pulse: 98  86 65  Resp: 17  19 (!) 21  Temp:    97.8 F (36.6 C)  TempSrc:    Axillary  SpO2: 100% 98% 98% 98%  Weight:      Height:        Intake/Output Summary (Last 24 hours) at 03/02/2017 0949 Last data filed at 03/02/2017 0350 Gross per 24 hour  Intake 1210 ml  Output 950 ml  Net 260 ml   Filed Weights   02/28/17 0353 03/01/17 0350 03/02/17 0346  Weight: 94.2 kg (207 lb 10.8 oz) 93.9 kg (207 lb 0.2 oz) 96.6 kg (212 lb 15.4 oz)    Telemetry    Sinus tachycardia with tracking- Personally Reviewed  ECG    No new data- Personally Reviewed  Physical Exam   GEN: WD, elderly WM, + respiratory distress.    Neck: JVD difficult to assess due to body habitus, but is elevated Cardiac: RRR, no obvious M/R/G Respiratory: +wheeze, + rales, +  rhonchi GI: soft, NT, ND MS: no edema, moves all extrem Neuro:  no focal deficits Psych: nl affect  Labs    Chemistry Recent Labs  Lab 02/27/17 1338 02/28/17 0142 03/01/17 0204 03/02/17 0324  NA 134* 131* 133* 134*  K 4.4 4.5 4.8 4.3  CL 102 101 99* 101  CO2 21* 20* 20* 17*  GLUCOSE 108* 244* 147* 162*  BUN 39* 42* 61* 78*  CREATININE 2.99* 2.94* 3.45* 3.65*  CALCIUM 8.9 8.4* 8.7* 8.6*  PROT 7.8  --   --   --   ALBUMIN 3.3*  --   --   --   AST 27  --   --   --   ALT 12*  --   --   --   ALKPHOS 77  --   --   --   BILITOT 1.3*  --   --   --   GFRNONAA 18* 18* 15* 14*  GFRAA 21* 21* 17* 16*  ANIONGAP 11 10 14  16*     Hematology Recent Labs  Lab 02/28/17 0142 03/01/17 0204 03/02/17 0758  WBC 21.5* 21.0* 15.7*  RBC 3.23* 3.36* 3.37*  HGB 9.5* 9.6* 9.9*  HCT 30.5* 31.6* 30.9*  MCV 94.4 94.0 91.7  MCH 29.4 28.6 29.4  MCHC 31.1 30.4 32.0  RDW 15.4 15.2 15.0  PLT 172 234 284    Cardiac Enzymes Recent Labs  Lab 02/27/17 1338 02/27/17 2012  TROPONINI 0.14* 0.13*    Recent Labs  Lab 02/27/17 1344  TROPIPOC 0.17*     BNP Recent Labs  Lab 02/27/17 1338 03/01/17 1007  BNP 1,214.4* 1,858.9*     Radiology    No results found.  Cardiac Studies   ECHO performed, results pending  Patient Profile     83 y.o. male  with a PMH of CAD s/p CABG in 2005, PAD s/p aorto-bifem bypass, chronic combined CHF, bradycardia c/b CHB now s/p PPM 09/2016, COPD, CKD stage IV, chronic anemia, AAA s/p repair in 2014, stroke, DM, hyperlipidemia, HTN, tobacco abuse who is being seen 12/26 for the evaluation of CHF at the request of Dr. Rockne [name].  Acute worsening kidney function with diuresis.   Assessment & Plan    1.  Chronic combined systolic and diastolic heart failure, Got 1 L IVF yesterday, wt today up 5 lbs and SOB is worse. Needs diuresis but BUN/Cr sig elevated. Discuss w/ MD if we should contact CHF team or renal. Diuretics held due to worsening renal function after 1  dose of Lasix 40 mg. BNP rechecked and is higher. Echo results pending.  2.  Acute on chronic kidney injury, stage IV.  Discuss diuresis w/ MD  3. Tachycardia: s/p MDT device interrogation, has not had anything but SR/ST since 12/23. Increased HR is sinus tachycardia related, not arrhythmia. 4.  Streptococcal pneumonia bacteremia. Per IM, Dr [name] [name] has seen today. 5.  Acute on chronic respiratory failure, Clinically worse, review cards rx w/ MD   For questions or updates, please contact [facility] Please consult [email or web address] for contact info under Cardiology/STEMI.      Signed, [name] Ferries, PA-C  03/02/2017, 9:49 AM    I have personally seen and examined this patient with [name] Ferries, PA-C I agree with the assessment and plan as outlined above. [name] [name] has known CAD s/p CABG, COPD, PPM in place for tachy-brady, chronic systolic CHF, ischemic cardiomyopathy and stage 4 CKD admitted with progressive dyspnea and cough. He has not tolerated diuresis due to worsened renal failure. His weight continues to increase and he remains on bipap. Discharge weight 01/24/17 was 204 lbs, now 212 lbs. BNP continues to increase. I have reviewed his echo images from yesterday. Previous LVEF around 45% but his LV systolic function appears worse than that now. Official echo reading to follow today.  My exam shows a WDWN male in no distress. He does appear to have increased work of breathing. CV:RRR. Pulm: crackles at the bases with diffuse wheezing. Abd: non-distended. Soft. Ext: no edema This is his second admission in the last 45 days for acute CHF. He has no LE edema but I suspect that he is volume overloaded. Diuresis is limited by his renal failure.  We will ask our CHF team to see him today to help guide management of his volume overload. I think the Nephrology team should also be involved.   [name] [name] 03/02/2017 10:51 AM

## 2017-03-02 NOTE — Progress Notes (Signed)
Pt with slight increase in WOB noted.  Attempted to place pt on bipap.  Pt refusing bipap at this time.  Oxygen saturation 91-96% on 4L [place].

## 2017-03-02 NOTE — ED Provider Notes (Signed)
[facility] PROGRESSIVE CARE Provider Note   CSN: [id number] Arrival date & time: 02/27/17  1327     History   Chief Complaint Chief Complaint  Patient presents with  . Altered Mental Status    HPI BROEDY OSBOURNE is a 83 y.o. male.  HPI Patient with history of CHF, COPD, CAD presents to the emergency department with increased confusion per wife.  Patient had 1 week of productive cough of white phlegm.  Denies fever or chills.  No chest pain. Past Medical History:  Diagnosis Date  . AAA (abdominal aortic aneurysm) [facility])    Surgical repair June, 2014  . Acute urinary retention 04/09/2013  . CAD (coronary artery disease)    a. s/p CABG in 2005 x 4. b. low risk nuc 2016.  . Carotid artery occlusion   . Chronic anemia   . Chronic combined systolic and diastolic CHF (congestive heart failure) ([place])   . Chronic renal insufficiency   . CKD (chronic kidney disease), stage III ([place])   . Closed displaced fracture of right femoral neck ([place]) 04/08/2013  . Complete heart block ([place])   . COPD (chronic obstructive pulmonary disease) ([place]) 08/15/2012  . CVA (cerebral vascular accident) ([employer]) ~ 2003   residual R leg weakness (01/22/2017)  . Dyslipidemia   . Fall at home 04/09/2013   Fx Right Hip  . HTN (hypertension)   . Hx of CABG    2005  . Morbid obesity ([place])   . MRSA carrier 02/28/2017  . Myocardial infarction [facility])    "was told I had one before I had my heart bypass" (01/22/2017)  . Neuropathy    Right foot  . Peripheral vascular disease ([employer])    a. s/p aorto-bifem bypass.  . Presence of permanent cardiac pacemaker   . S/P placement of cardiac pacemaker    a. [employer] 09/2016.  . Status post abdominal aortic aneurysm repair    June, 2014  . Tobacco abuse    quit 2014  . Type II diabetes mellitus [facility])     Patient Active Problem List   Diagnosis Date Noted  . Acute hypoxemic respiratory failure ([place]) 03/02/2017  . CKD (chronic kidney disease), stage IV ([place])  03/02/2017  . AKI (acute kidney injury) ([place]) 03/02/2017  . Anemia in chronic kidney disease (CKD) 03/02/2017  . Demand ischemia ([place]) 03/02/2017  . Bacteremia due to Streptococcus pneumoniae 03/01/2017  . MRSA carrier 02/28/2017  . Pneumonia 02/27/2017  . Severe sepsis ([place]) 02/27/2017  . Presence of permanent cardiac pacemaker   . S/P placement of cardiac pacemaker 01/22/2017  . Acute on chronic combined systolic and diastolic CHF (congestive heart failure) ([place]) 01/22/2017  . Hip fracture, right  S/P hip arthroplasty 04/14/2013  . Insomnia 04/14/2013  . Renal failure, acute on chronic (HCC) 04/09/2013  . DM2 (diabetes mellitus, type 2) ([place]) 04/08/2013  . PAD (peripheral artery disease)--bifemoral bypass graft. 08/20/2012  . Heart block AV second degree - episodic 08/16/2012  . COPD with acute exacerbation ([place]) 08/15/2012  . CAD (coronary artery disease)   . Neuropathy   . Essential hypertension   . Dyslipidemia   . CVA (cerebral vascular accident) ([place])   . AAA (abdominal aortic aneurysm) ([place])   . Renal artery stenosis ([place])   . Tobacco abuse   . Hx of CABG     Past Surgical History:  Procedure Laterality Date  . ABDOMINAL AORTAGRAM N/A 08/07/2012   Procedure: ABDOMINAL AORTAGRAM;  Surgeon: Rosetta Posner, MD;  Location: [facility] CATH LAB;  Service: Cardiovascular;  Laterality: N/A;  . AORTA - BILATERAL FEMORAL ARTERY BYPASS GRAFT N/A 08/09/2012   Procedure: AORTA BIFEMORAL BYPASS GRAFT;  Surgeon: Rosetta Posner, MD;  Location: [facility];  Service: Vascular;  Laterality: N/A;  . CORONARY ARTERY BYPASS GRAFT  2005   CABG X"5"  . HIP ARTHROPLASTY Right 04/09/2013   Procedure: ARTHROPLASTY BIPOLAR HIP;  Surgeon: Gearlean Alf, MD;  Location: [facility];  Service: Orthopedics;  Laterality: Right;  . INSERT / REPLACE / REMOVE PACEMAKER    . JOINT REPLACEMENT    . LUMBAR DISC SURGERY  1977   Lumbar HNP surgery  . PACEMAKER IMPLANT N/A 09/11/2016   Procedure: Pacemaker Implant;  Surgeon: Deboraha Sprang, MD;  Location: [facility] CV LAB;  Service: Cardiovascular;  Laterality: N/A;  . TONSILLECTOMY  1940       Home Medications    Prior to Admission medications   Medication Sig Start Date End Date Taking? Authorizing Provider  albuterol (PROVENTIL) (2.5 MG/3ML) 0.083% nebulizer solution Take 3 mLs (2.5 mg total) by nebulization every 4 (four) hours as needed for wheezing or shortness of breath. 01/24/17  Yes Duke, Tami Lin, PA  ANORO ELLIPTA 62.5-25 MCG/INH AEPB Inhale 1 puff into the lungs every morning.  07/03/16  Yes [provider]  aspirin 81 MG tablet Take 81 mg by mouth every morning.    Yes [provider]  atorvastatin (LIPITOR) 40 MG tablet Take 40 mg by mouth every morning.    Yes [provider]  Chlorphen-Phenyleph-APAP (CORICIDIN D COLD/FLU/SINUS) 2-5-325 MG TABS Take 2 tablets by mouth daily.   Yes [provider]  cholecalciferol (VITAMIN D) 1000 UNITS tablet Take 1,000 Units by mouth every morning.    Yes [provider]  escitalopram (LEXAPRO) 5 MG tablet Take 5 mg by mouth every evening.  12/08/13  Yes [provider]  furosemide (LASIX) 40 MG tablet Take 1 tablet (40 mg total) by mouth daily. Patient taking differently: Take 20 mg by mouth daily.  02/06/17 02/01/18 Yes Dunn, Dayna N, PA-C  gabapentin (NEURONTIN) 300 MG capsule Take 300 mg by mouth every morning.    Yes [provider]  hydrALAZINE (APRESOLINE) 50 MG tablet Take 1 tablet (50 mg total) by mouth 3 (three) times daily. 09/12/16  Yes Baldwin Jamaica, PA-C  isosorbide mononitrate (IMDUR) 30 MG 24 hr tablet Take 1 tablet (30 mg total) by mouth daily. 02/06/17 05/07/17 Yes Dunn, Dayna N, PA-C  Melatonin 5 MG TABS Take 1 tablet by mouth at bedtime.   Yes [provider]  Mometasone Furoate [facility] HFA) 100 MCG/ACT AERO Inhale 2 puffs into the lungs daily. Patient taking differently: Inhale 2 puffs into the lungs at bedtime.  02/21/17   Yes Brand Males, MD  montelukast (SINGULAIR) 10 MG tablet Take 10 mg by mouth every evening.  08/09/16  Yes [provider]  oxybutynin (DITROPAN-XL) 5 MG 24 hr tablet Take 5 mg by mouth at bedtime.  07/01/12  Yes [provider]  potassium chloride (K-DUR) 10 MEQ tablet Take 1 tablet (10 mEq total) by mouth daily. 02/06/17 02/01/18 Yes Dunn, Dayna N, PA-C  PROAIR HFA 108 (90 Base) MCG/ACT inhaler Inhale 2 puffs into the lungs every 4 (four) hours as needed for wheezing or shortness of breath. 01/24/17  Yes Duke, Tami Lin, PA  TRADJENTA 5 MG TABS tablet Take 5 mg by mouth every morning. 09/04/16  Yes [provider]  traMADol (ULTRAM) 50 MG tablet Take 1 tablet by mouth every 6 (six) hours as needed for moderate pain.  05/28/13  Yes [provider]  vitamin B-12 (CYANOCOBALAMIN) 100 MCG tablet Take 100 mcg by mouth every morning.    Yes [provider]  zolpidem (AMBIEN) 10 MG tablet Take 5 mg by mouth at bedtime.  04/11/13  Yes Robbie Lis, MD  Mometasone Furoate [facility] HFA) 100 MCG/ACT AERO Inhale 2 puffs into the lungs daily. Patient not taking: Reported on 02/27/2017 02/21/17   Brand Males, MD    Family History Family History  Problem Relation Age of Onset  . Tuberculosis Mother 65  . Coronary artery disease Father   . Varicose Veins Father     Social History Social History   Tobacco Use  . Smoking status: Former Smoker    Packs/day: 1.00    Years: 50.00    Pack years: 50.00    Types: Cigarettes    Last attempt to quit: 08/05/2012    Years since quitting: 4.5  . Smokeless tobacco: Never Used  Substance Use Topics  . Alcohol use: Yes    Comment: 01/22/2017 "quit drinking ~ 2015"  . Drug use: No     Allergies   Patient has no known allergies.   Review of Systems Review of Systems  Constitutional: Positive for fatigue. Negative for chills and fever.  HENT: Negative for congestion and sore throat.   Respiratory:  Positive for cough and shortness of breath. Negative for chest tightness.   Cardiovascular: Positive for leg swelling. Negative for chest pain and palpitations.  Gastrointestinal: Negative for abdominal pain, diarrhea, nausea and vomiting.  Genitourinary: Negative for flank pain, frequency and hematuria.  Musculoskeletal: Negative for back pain and myalgias.  Skin: Negative for rash and wound.  Neurological: Negative for dizziness, weakness, light-headedness, numbness and headaches.  Psychiatric/Behavioral: Positive for confusion.  All other systems reviewed and are negative.    Physical Exam Updated Vital Signs BP (!) 152/110 (BP Location: Right Arm) Comment: input on wrong pt.  Pulse 71   Temp (!) 97.4 F (36.3 C) (Oral)   Resp (!) 25   Ht 5\' 10"  (1.778 m)   Wt 96.6 kg (212 lb 15.4 oz)   SpO2 97%   BMI 30.56 kg/m   Physical Exam  Constitutional: He is oriented to person, place, and time. He appears well-developed and well-nourished.  HENT:  Head: Normocephalic and atraumatic.  Mouth/Throat: Oropharynx is clear and moist.  Eyes: EOM are normal. Pupils are equal, round, and reactive to light.  Neck: Normal range of motion. Neck supple.  Cardiovascular: Regular rhythm. Exam reveals no gallop and no friction rub.  No murmur heard. Tachycardia  Pulmonary/Chest: He has wheezes.  Increased work of breathing.  Expiratory wheezing throughout.  Patient has also has bilateral rhonchi in bases.  Abdominal: Soft. Bowel sounds are normal. There is no tenderness. There is no rebound and no guarding.  Musculoskeletal: Normal range of motion. He exhibits edema. He exhibits no tenderness.  Bilateral lower extremity edema.  No asymmetry or tenderness.  Neurological: He is alert and oriented to person, place, and time.  Mild confusion.  Following commands.  No acute focal weakness.  Sensation intact.  Skin: Skin is warm and dry. No rash noted. No erythema.  Psychiatric: He has a normal mood  and affect. His behavior is normal.  Nursing note and vitals reviewed.    ED Treatments / Results  Labs (all labs ordered are listed,  but only abnormal results are displayed) Labs Reviewed  CULTURE, BLOOD (ROUTINE X 2) - Abnormal; Notable for the following components:      Result Value   Culture   (*)    Value: STREPTOCOCCUS PNEUMONIAE SUSCEPTIBILITIES PERFORMED ON PREVIOUS CULTURE WITHIN THE LAST 5 DAYS.    All other components within normal limits  CULTURE, BLOOD (ROUTINE X 2) - Abnormal; Notable for the following components:   Culture STREPTOCOCCUS PNEUMONIAE (*)    All other components within normal limits  MRSA PCR SCREENING - Abnormal; Notable for the following components:   MRSA by PCR POSITIVE (*)    All other components within normal limits  BLOOD CULTURE ID PANEL (REFLEXED) - Abnormal; Notable for the following components:   Streptococcus species DETECTED (*)    Streptococcus pneumoniae DETECTED (*)    All other components within normal limits  COMPREHENSIVE METABOLIC PANEL - Abnormal; Notable for the following components:   Sodium 134 (*)    CO2 21 (*)    Glucose, Bld 108 (*)    BUN 39 (*)    Creatinine, Ser 2.99 (*)    Albumin 3.3 (*)    ALT 12 (*)    Total Bilirubin 1.3 (*)    GFR calc non Af Amer 18 (*)    GFR calc Af Amer 21 (*)    All other components within normal limits  CBC - Abnormal; Notable for the following components:   WBC 20.8 (*)    RBC 3.63 (*)    Hemoglobin 10.7 (*)    HCT 34.3 (*)    All other components within normal limits  URINALYSIS, ROUTINE W REFLEX MICROSCOPIC - Abnormal; Notable for the following components:   APPearance HAZY (*)    Protein, ur 100 (*)    Bacteria, UA RARE (*)    Squamous Epithelial / LPF 0-5 (*)    All other components within normal limits  BRAIN NATRIURETIC PEPTIDE - Abnormal; Notable for the following components:   B Natriuretic Peptide 1,214.4 (*)    All other components within normal limits  TROPONIN I -  Abnormal; Notable for the following components:   Troponin I 0.14 (*)    All other components within normal limits  TROPONIN I - Abnormal; Notable for the following components:   Troponin I 0.13 (*)    All other components within normal limits  LACTIC ACID, PLASMA - Abnormal; Notable for the following components:   Lactic Acid, Venous 3.4 (*)    All other components within normal limits  BASIC METABOLIC PANEL - Abnormal; Notable for the following components:   Sodium 131 (*)    CO2 20 (*)    Glucose, Bld 244 (*)    BUN 42 (*)    Creatinine, Ser 2.94 (*)    Calcium 8.4 (*)    GFR calc non Af Amer 18 (*)    GFR calc Af Amer 21 (*)    All other components within normal limits  CBC - Abnormal; Notable for the following components:   WBC 21.5 (*)    RBC 3.23 (*)    Hemoglobin 9.5 (*)    HCT 30.5 (*)    All other components within normal limits  GLUCOSE, CAPILLARY - Abnormal; Notable for the following components:   Glucose-Capillary 175 (*)    All other components within normal limits  BASIC METABOLIC PANEL - Abnormal; Notable for the following components:   Sodium 133 (*)    Chloride 99 (*)  CO2 20 (*)    Glucose, Bld 147 (*)    BUN 61 (*)    Creatinine, Ser 3.45 (*)    Calcium 8.7 (*)    GFR calc non Af Amer 15 (*)    GFR calc Af Amer 17 (*)    All other components within normal limits  CBC - Abnormal; Notable for the following components:   WBC 21.0 (*)    RBC 3.36 (*)    Hemoglobin 9.6 (*)    HCT 31.6 (*)    All other components within normal limits  BRAIN NATRIURETIC PEPTIDE - Abnormal; Notable for the following components:   B Natriuretic Peptide 1,858.9 (*)    All other components within normal limits  GLUCOSE, CAPILLARY - Abnormal; Notable for the following components:   Glucose-Capillary 133 (*)    All other components within normal limits  BASIC METABOLIC PANEL - Abnormal; Notable for the following components:   Sodium 134 (*)    CO2 17 (*)    Glucose, Bld  162 (*)    BUN 78 (*)    Creatinine, Ser 3.65 (*)    Calcium 8.6 (*)    GFR calc non Af Amer 14 (*)    GFR calc Af Amer 16 (*)    Anion gap 16 (*)    All other components within normal limits  GLUCOSE, CAPILLARY - Abnormal; Notable for the following components:   Glucose-Capillary 154 (*)    All other components within normal limits  CBC - Abnormal; Notable for the following components:   WBC 15.7 (*)    RBC 3.37 (*)    Hemoglobin 9.9 (*)    HCT 30.9 (*)    All other components within normal limits  GLUCOSE, CAPILLARY - Abnormal; Notable for the following components:   Glucose-Capillary 156 (*)    All other components within normal limits  GLUCOSE, CAPILLARY - Abnormal; Notable for the following components:   Glucose-Capillary 144 (*)    All other components within normal limits  I-STAT TROPONIN, ED - Abnormal; Notable for the following components:   Troponin i, poc 0.17 (*)    All other components within normal limits  I-STAT CG4 LACTIC ACID, ED - Abnormal; Notable for the following components:   Lactic Acid, Venous 2.54 (*)    All other components within normal limits  I-STAT ARTERIAL BLOOD GAS, ED - Abnormal; Notable for the following components:   pH, Arterial 7.347 (*)    Acid-base deficit 4.0 (*)    All other components within normal limits  CG4 I-STAT (LACTIC ACID) - Abnormal; Notable for the following components:   Lactic Acid, Venous 2.42 (*)    All other components within normal limits  INFLUENZA PANEL BY PCR (TYPE A & B)  LACTIC ACID, PLASMA  I-STAT CG4 LACTIC ACID, ED    EKG  EKG Interpretation  Date/Time:  Tuesday February 27 2017 13:46:42 EST Ventricular Rate:  110 PR Interval:    QRS Duration: 150 QT Interval:  357 QTC Calculation: 483 R Axis:   -2 Text Interpretation:  Sinus or ectopic atrial tachycardia Left bundle branch block Confirmed by Julianne Rice [phone]) on 02/27/2017 3:08:53 PM       Radiology No results  found.  Procedures Procedures (including critical care time)  Medications Ordered in ED Medications  aspirin EC tablet 81 mg (81 mg Oral Given 03/02/17 0918)  atorvastatin (LIPITOR) tablet 40 mg (40 mg Oral Given 03/02/17 0917)  escitalopram (LEXAPRO) tablet 5 mg (5  mg Oral Given 03/01/17 1706)  gabapentin (NEURONTIN) capsule 300 mg (300 mg Oral Given 03/02/17 0917)  traMADol (ULTRAM) tablet 50 mg (not administered)  isosorbide mononitrate (IMDUR) 24 hr tablet 30 mg (30 mg Oral Given 03/02/17 0917)  ipratropium-albuterol (DUONEB) 0.5-2.5 (3) MG/3ML nebulizer solution 3 mL (3 mLs Nebulization Given 03/02/17 1432)  Melatonin TABS 6 mg (6 mg Oral Given 03/01/17 2131)  heparin injection 5,000 Units (5,000 Units Subcutaneous Given 03/02/17 1409)  mupirocin ointment (BACTROBAN) 2 % 1 application (1 application Nasal Given 03/02/17 0919)  Chlorhexidine Gluconate Cloth 2 % PADS 6 each (6 each Topical Given 03/02/17 0526)  cefTRIAXone (ROCEPHIN) 2 g in dextrose 5 % 50 mL IVPB (0 g Intravenous Stopped 03/02/17 0900)  MEDLINE mouth rinse (15 mLs Mouth Rinse Given 03/02/17 0920)  zolpidem (AMBIEN) tablet 5 mg (5 mg Oral Given 03/01/17 2322)  methylPREDNISolone sodium succinate (SOLU-MEDROL) 125 mg/2 mL injection 60 mg (60 mg Intravenous Given 03/02/17 1409)  insulin aspart (novoLOG) injection 0-15 Units (2 Units Subcutaneous Given 03/02/17 1231)  insulin aspart (novoLOG) injection 0-5 Units (0 Units Subcutaneous Not Given 03/01/17 2132)  ipratropium-albuterol (DUONEB) 0.5-2.5 (3) MG/3ML nebulizer solution 3 mL (3 mLs Nebulization Given 03/02/17 1037)  furosemide (LASIX) injection 40 mg (not administered)  albuterol (PROVENTIL) (2.5 MG/3ML) 0.083% nebulizer solution 5 mg (5 mg Nebulization Given 02/27/17 1427)  methylPREDNISolone sodium succinate (SOLU-MEDROL) 125 mg/2 mL injection 125 mg (125 mg Intravenous Given 02/27/17 1427)  vancomycin (VANCOCIN) IVPB 1000 mg/200 mL premix (0 mg Intravenous  Stopped 02/27/17 1707)  piperacillin-tazobactam (ZOSYN) IVPB 3.375 g (0 g Intravenous Stopped 02/27/17 1536)  sodium chloride 0.9 % bolus 500 mL (0 mLs Intravenous Stopped 02/27/17 1606)  vancomycin (VANCOCIN) IVPB 1000 mg/200 mL premix (0 mg Intravenous Stopped 02/27/17 2255)  sodium chloride 0.9 % bolus 500 mL (0 mLs Intravenous Stopped 02/28/17 0015)  furosemide (LASIX) injection 40 mg (40 mg Intravenous Given 02/28/17 1457)  albuterol (PROVENTIL) (2.5 MG/3ML) 0.083% nebulizer solution 5 mg (5 mg Nebulization Given 03/01/17 1027)  albuterol (PROVENTIL) (2.5 MG/3ML) 0.083% nebulizer solution 5 mg (0 mg Nebulization Duplicate 78/93/81 0175)  hydrALAZINE (APRESOLINE) injection 10 mg (10 mg Intravenous Given 03/01/17 2045)  morphine 2 MG/ML injection 2 mg (2 mg Intravenous Given 03/02/17 0525)     Initial Impression / Assessment and Plan / ED Course  I have reviewed the triage vital signs and the nursing notes.  Pertinent labs & imaging results that were available during my care of the patient were reviewed by me and considered in my medical decision making (see chart for details).     Patient placed on BiPAP.  Improvement in work of breathing.  Shortness breath is likely multifactorial.  CHF/COPD/pneumonia.  Judicious fluid administration due to concern for CHF exacerbation.  Initiated broad-spectrum antibiotics.  Discussed with hospitalist who will see patient in the emergency department and admit. Final Clinical Impressions(s) / ED Diagnoses   Final diagnoses:  Pneumonia  AKI (acute kidney injury) [facility])    ED Discharge Orders    None       Julianne Rice, MD 03/02/17 1556

## 2017-03-02 NOTE — Progress Notes (Signed)
Attempted to take patient off BIPAP this morning to administer breathing treatment per patient request. Patient did not tolerate being off BIPAP due to increased work of breathing. Patient was placed back on BIPAP & RN was made aware.

## 2017-03-03 ENCOUNTER — Inpatient Hospital Stay (HOSPITAL_COMMUNITY): Payer: Medicare Other

## 2017-03-03 DIAGNOSIS — R7881 Bacteremia: Secondary | ICD-10-CM

## 2017-03-03 DIAGNOSIS — Z951 Presence of aortocoronary bypass graft: Secondary | ICD-10-CM

## 2017-03-03 DIAGNOSIS — I739 Peripheral vascular disease, unspecified: Secondary | ICD-10-CM

## 2017-03-03 DIAGNOSIS — Z950 Presence of cardiac pacemaker: Secondary | ICD-10-CM

## 2017-03-03 DIAGNOSIS — R748 Abnormal levels of other serum enzymes: Secondary | ICD-10-CM

## 2017-03-03 LAB — GLUCOSE, CAPILLARY
GLUCOSE-CAPILLARY: 178 mg/dL — AB (ref 65–99)
GLUCOSE-CAPILLARY: 152 mg/dL — AB (ref 65–99)
GLUCOSE-CAPILLARY: 135 mg/dL — AB (ref 65–99)
Glucose-Capillary: 201 mg/dL — ABNORMAL HIGH (ref 65–99)

## 2017-03-03 LAB — CBC
HCT: 30.1 % — ABNORMAL LOW (ref 39.0–52.0)
Hemoglobin: 9.4 g/dL — ABNORMAL LOW (ref 13.0–17.0)
MCH: 28.9 pg (ref 26.0–34.0)
MCHC: 31.2 g/dL (ref 30.0–36.0)
MCV: 92.6 fL (ref 78.0–100.0)
PLATELETS: 281 10*3/uL (ref 150–400)
RBC: 3.25 MIL/uL — AB (ref 4.22–5.81)
RDW: 15.1 % (ref 11.5–15.5)
WBC: 12.8 10*3/uL — AB (ref 4.0–10.5)

## 2017-03-03 LAB — COOXEMETRY PANEL
Carboxyhemoglobin: 0.8 % (ref 0.5–1.5)
Methemoglobin: 1.4 % (ref 0.0–1.5)
O2 SAT: 72.5 %
TOTAL HEMOGLOBIN: 8.9 g/dL — AB (ref 12.0–16.0)

## 2017-03-03 LAB — BASIC METABOLIC PANEL
Anion gap: 15 (ref 5–15)
BUN: 95 mg/dL — ABNORMAL HIGH (ref 6–20)
CALCIUM: 8.7 mg/dL — AB (ref 8.9–10.3)
CHLORIDE: 100 mmol/L — AB (ref 101–111)
CO2: 19 mmol/L — ABNORMAL LOW (ref 22–32)
CREATININE: 3.92 mg/dL — AB (ref 0.61–1.24)
GFR, EST AFRICAN AMERICAN: 15 mL/min — AB (ref >60)
GFR, EST NON AFRICAN AMERICAN: 13 mL/min — AB (ref >60)
Glucose, Bld: 177 mg/dL — ABNORMAL HIGH (ref 65–99)
Potassium: 4.8 mmol/L (ref 3.5–5.1)
SODIUM: 134 mmol/L — AB (ref 135–145)

## 2017-03-03 LAB — CARBOXYHEMOGLOBIN - COOX: CARBOXYHEMOGLOBIN: 0.8 % (ref 0.5–1.5)

## 2017-03-03 LAB — MAGNESIUM: Magnesium: 2.8 mg/dL — ABNORMAL HIGH (ref 1.7–2.4)

## 2017-03-03 MED ORDER — IPRATROPIUM-ALBUTEROL 0.5-2.5 (3) MG/3ML IN SOLN
3.0000 mL | Freq: Two times a day (BID) | RESPIRATORY_TRACT | Status: DC
  Administered 2017-03-04 – 2017-03-05 (×3): 3 mL via RESPIRATORY_TRACT
  Filled 2017-03-03 (×3): qty 3

## 2017-03-03 MED ORDER — HYDRALAZINE HCL 25 MG PO TABS
25.0000 mg | ORAL_TABLET | Freq: Three times a day (TID) | ORAL | Status: DC
  Administered 2017-03-03 – 2017-03-08 (×14): 25 mg via ORAL
  Filled 2017-03-03 (×16): qty 1

## 2017-03-03 MED ORDER — FUROSEMIDE 10 MG/ML IJ SOLN
40.0000 mg | Freq: Every day | INTRAMUSCULAR | Status: DC
  Administered 2017-03-03: 40 mg via INTRAVENOUS
  Filled 2017-03-03: qty 4

## 2017-03-03 MED ORDER — HYDRALAZINE HCL 20 MG/ML IJ SOLN
5.0000 mg | Freq: Once | INTRAMUSCULAR | Status: AC | PRN
  Administered 2017-03-03: 5 mg via INTRAVENOUS
  Filled 2017-03-03: qty 1

## 2017-03-03 MED ORDER — CARVEDILOL 3.125 MG PO TABS
3.1250 mg | ORAL_TABLET | Freq: Two times a day (BID) | ORAL | Status: DC
  Administered 2017-03-03 – 2017-03-08 (×11): 3.125 mg via ORAL
  Filled 2017-03-03 (×11): qty 1

## 2017-03-03 NOTE — Procedures (Signed)
Name:  [name] [name] MRN:  [id number] DOB:  12-29-1931  PROCEDURE NOTE  Procedure:  Central venous catheter placement.  Indications:  Need for intravenous access and hemodynamic monitoring.  Consent:  Consent was implied due to the emergency nature of the procedure.  Anesthesia:  A total of 10 mL of 1% Lidocaine was used for local infiltration anesthesia.  Procedure summary:  Appropriate equipment was assembled.  The patient was identified as [name] [name] and safety timeout was performed. The patient was placed in Trendelenburg position.  Sterile technique was used. The patient's right IJ anterior chest wall was prepped using chlorhexidine / alcohol scrub and the field was draped in usual sterile fashion with full body drape. After the adequate anesthesia was achieved, the Right IJ vein was cannulated with the introducer needle without  difficulty. A guide wire was advanced through the introducer needle, which was then withdrawn. A small skin incision was made at the point of wire entry, the dilator was inserted over the guide wire and appropriate dilation was obtained. The dilator was removed and  triple-lumen catheter was advanced over the guide wire, which was then removed.  All ports were aspirated and flushed with normal saline without difficulty. The catheter was secured into place at 16 cm. Antibiotic patch was placed and sterile dressing was applied. Post-procedure chest x-ray was ordered.  Complications:  No immediate complications were noted.  Hemodynamic parameters and oxygenation remained stable throughout the procedure.  Estimated blood loss:  Less then 5 mL.  Etheleen Nicks, MD Pulmonary and [place] Cell: [phone]  03/03/2017, 6:29 PM

## 2017-03-03 NOTE — Progress Notes (Signed)
DAILY PROGRESS NOTE   Patient Name: [name] [name] Date of Encounter: 03/03/2017  Chief Complaint   Breathing better than yesterday  Patient Profile   83 y.o. 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 Rockne Menghini.  Acute worsening kidney function with diuresis.  Subjective   Appreciate nephrology assistance -received BID IV lasix yesterday with about 850 cc recorded negative. Weight down from 212 to 209 lbs. BUN up, have backed off on lasix today. Remains volume overloaded. Looks weak, very tired. Echo yesterday shows further decline in LVEF to 25-30% with inferior wall motion abnormality.  This could suggest progressive CAD of his bypass grafts, however, he is not interested in dialysis and is at high risk for contrast nephropathy with cath. His worsening renal function with diuresis may also suggest a cardiorenal syndrome, given his progressive cardiomyopathy - I suspect he has low output heart failure. Echo also raised the concern about possible lead vegetation seen in right atrium - he does have a S. Pneumoniae sepsis and RLL PNA.   Objective   Vitals:   03/03/17 0310 03/03/17 0441 03/03/17 0757 03/03/17 0835  BP: (!) 160/109 (!) 170/107 (!) 147/100   Pulse: (!) 105     Resp: 16     Temp: 98.3 F (36.8 C)  98 F (36.7 C)   TempSrc: Oral  Oral   SpO2: 93%   96%  Weight: 209 lb 10.5 oz (95.1 kg)     Height:        Intake/Output Summary (Last 24 hours) at 03/03/2017 1023 Last data filed at 03/03/2017 0700 Gross per 24 hour  Intake 250 ml  Output 1200 ml  Net -950 ml   Filed Weights   03/01/17 0350 03/02/17 0346 03/03/17 0310  Weight: 207 lb 0.2 oz (93.9 kg) 212 lb 15.4 oz (96.6 kg) 209 lb 10.5 oz (95.1 kg)     Physical Exam   General appearance: fatigued, mild distress and moderately obese Neck: JVD - to earlobe cm above sternal notch, no carotid bruit, supple, symmetrical, trachea midline and thyroid not enlarged, symmetric, no tenderness/mass/nodules Lungs: diminished breath sounds bilaterally and rales bibasilar Heart: irregularly irregular rhythm Abdomen: soft, non-tender; bowel sounds normal; no masses,  no organomegaly and distended Extremities: edema trace LE edema Pulses: 1+ pulses Skin: pale, cool, dry Neurologic: Mental status: somnolent but arousable, answers questions, no focal defecits Psych: Somnolent, fatigued  Inpatient Medications    Scheduled Meds: . aspirin EC  81 mg Oral Daily  . atorvastatin  40 mg Oral Daily  . Chlorhexidine Gluconate Cloth  6 each Topical Q0600  . escitalopram  5 mg Oral QPM  . furosemide  40 mg Intravenous Daily  . gabapentin  300 mg Oral Daily  . heparin  5,000 Units Subcutaneous Q8H  . insulin aspart  0-15 Units Subcutaneous TID WC  . insulin aspart  0-5 Units Subcutaneous QHS  . ipratropium-albuterol  3 mL Nebulization Q6H  . isosorbide mononitrate  30 mg Oral Daily  . mouth rinse  15 mL Mouth Rinse BID  . Melatonin  6 mg Oral QHS  . methylPREDNISolone (SOLU-MEDROL) injection  60 mg Intravenous Q8H  . mupirocin ointment  1 application Nasal BID    Continuous Infusions: .  cefTRIAXone (ROCEPHIN)  IV Stopped (03/02/17 0900)    PRN Meds: ipratropium-albuterol, ondansetron (ZOFRAN) IV, promethazine, traMADol, zolpidem   Labs   Results for orders placed or performed during the hospital encounter of 02/27/17 (from the past 48 hour(s))  Glucose, capillary     Status: Abnormal   Collection Time: 03/01/17  3:59 PM  Result Value Ref Range   Glucose-Capillary 133 (H) 65 - 99 mg/dL  Glucose, capillary     Status: Abnormal   Collection Time: 03/01/17  9:30 PM  Result Value Ref Range   Glucose-Capillary 154 (H) 65 - 99 mg/dL  Basic  metabolic panel     Status: Abnormal   Collection Time: 03/02/17  3:24 AM  Result Value Ref Range   Sodium 134 (L) 135 - 145 mmol/L   Potassium 4.3 3.5 - 5.1 mmol/L   Chloride 101 101 - 111 mmol/L   CO2 17 (L) 22 - 32 mmol/L   Glucose, Bld 162 (H) 65 - 99 mg/dL   BUN 78 (H) 6 - 20 mg/dL   Creatinine, Ser 3.65 (H) 0.61 - 1.24 mg/dL   Calcium 8.6 (L) 8.9 - 10.3 mg/dL   GFR calc non Af Amer 14 (L) >60 mL/min   GFR calc Af Amer 16 (L) >60 mL/min    Comment: (NOTE) The eGFR has been calculated using the CKD EPI equation. This calculation has not been validated in all clinical situations. eGFR's persistently <60 mL/min signify possible Chronic Kidney Disease.    Anion gap 16 (H) 5 - 15  CBC     Status: Abnormal   Collection Time: 03/02/17  7:58 AM  Result Value Ref Range   WBC 15.7 (H) 4.0 - 10.5 K/uL   RBC 3.37 (L) 4.22 - 5.81 MIL/uL   Hemoglobin 9.9 (L) 13.0 - 17.0 g/dL   HCT 30.9 (L) 39.0 - 52.0 %   MCV 91.7 78.0 - 100.0 fL   MCH 29.4 26.0 - 34.0 pg   MCHC 32.0 30.0 - 36.0 g/dL   RDW 15.0 11.5 - 15.5 %   Platelets 284 150 - 400 K/uL  Glucose, capillary     Status: Abnormal   Collection Time: 03/02/17  8:19 AM  Result Value Ref Range   Glucose-Capillary 156 (H) 65 - 99 mg/dL   Comment 1 Notify RN    Comment 2 Document in Chart   Glucose, capillary     Status: Abnormal   Collection Time: 03/02/17 12:10 PM  Result Value Ref Range   Glucose-Capillary 144 (H) 65 - 99 mg/dL  Glucose, capillary     Status: Abnormal   Collection Time: 03/02/17  4:44 PM  Result Value Ref Range   Glucose-Capillary 158 (H) 65 - 99 mg/dL  Glucose, capillary     Status: Abnormal   Collection Time: 03/02/17  7:52 PM  Result Value Ref Range   Glucose-Capillary 146 (H) 65 - 99 mg/dL  Basic metabolic panel     Status: Abnormal   Collection Time: 03/03/17  3:20 AM  Result Value Ref Range   Sodium 134 (L) 135 - 145 mmol/L   Potassium 4.8 3.5 - 5.1 mmol/L   Chloride 100 (L) 101 - 111 mmol/L   CO2 19  (L) 22 - 32 mmol/L   Glucose, Bld 177 (H) 65 - 99 mg/dL   BUN 95 (H) 6 - 20 mg/dL   Creatinine, Ser 3.92 (H) 0.61 - 1.24 mg/dL   Calcium 8.7 (L) 8.9 - 10.3 mg/dL   GFR calc  non Af Amer 13 (L) >60 mL/min   GFR calc Af Amer 15 (L) >60 mL/min    Comment: (NOTE) The eGFR has been calculated using the CKD EPI equation. This calculation has not been validated in all clinical situations. eGFR's persistently <60 mL/min signify possible Chronic Kidney Disease.    Anion gap 15 5 - 15  CBC     Status: Abnormal   Collection Time: 03/03/17  3:20 AM  Result Value Ref Range   WBC 12.8 (H) 4.0 - 10.5 K/uL   RBC 3.25 (L) 4.22 - 5.81 MIL/uL   Hemoglobin 9.4 (L) 13.0 - 17.0 g/dL   HCT 30.1 (L) 39.0 - 52.0 %   MCV 92.6 78.0 - 100.0 fL   MCH 28.9 26.0 - 34.0 pg   MCHC 31.2 30.0 - 36.0 g/dL   RDW 15.1 11.5 - 15.5 %   Platelets 281 150 - 400 K/uL  Glucose, capillary     Status: Abnormal   Collection Time: 03/03/17  7:55 AM  Result Value Ref Range   Glucose-Capillary 178 (H) 65 - 99 mg/dL   Comment 1 Notify RN    Comment 2 Document in Chart     ECG   N/A  Telemetry   Paced rhythm - Personally Reviewed  Radiology    US Renal  Result Date: 03/03/2017 CLINICAL DATA:  Acute onset of renal insufficiency. EXAM: RENAL / URINARY TRACT ULTRASOUND COMPLETE COMPARISON:  CTA of the abdomen and pelvis performed 07/19/2005, and renal ultrasound performed 10/04/2006 FINDINGS: Right Kidney: Length: 9.7 cm. Diffusely increased parenchymal echogenicity is noted. Renal cortical thinning is seen. No mass or hydronephrosis visualized. Left Kidney: Length: 10.1 cm. Diffusely increased parenchymal echogenicity is noted. Renal cortical thinning is seen. No mass or hydronephrosis visualized. Bladder: Appears normal for degree of bladder distention. IMPRESSION: 1. No evidence of hydronephrosis. 2. Diffusely increased parenchymal echogenicity raises concern for medical renal disease. 3. Renal cortical thinning likely  reflects chronic renal disease. Electronically Signed   By: Garald Balding M.D.   On: 03/03/2017 00:10    Cardiac Studies   LV EF: 25% -   30%  ------------------------------------------------------------------- Indications:      Bacteremia 790.7.  ------------------------------------------------------------------- Study Conclusions  - Left ventricle: The cavity size was mildly dilated. There was   mild concentric hypertrophy. Systolic function was severely   reduced. The estimated ejection fraction was in the range of 25%   to 30%. Wall motion was normal; there were no regional wall   motion abnormalities. - Aortic valve: There was trivial regurgitation. - Mitral valve: Calcified annulus. Mildly thickened leaflets .   There was moderate regurgitation. - Left atrium: The atrium was mildly dilated. - Right ventricle: The cavity size was mildly dilated. Wall   thickness was normal. Systolic function was mildly reduced. - Tricuspid valve: There was moderate regurgitation. - Pulmonary arteries: Systolic pressure was moderately increased.   PA peak pressure: 67 mm Hg (S).  Impressions:  - LVEF has decreased now 25-30%, previously 35-40%. There is   akinesis of the basal and mid inferior, inferolateral,   anteroseptal and apical inferior walls.   The right ventricular systolic pressure was increased consistent   with moderate pulmonary hypertension, previously mild.   A PM/ICD lead is seen in the right atrium, a vegatation can&'t be   excluded, if clinical indication high, a TEE is recommended for   further evaluation.  Assessment   1. Principal Problem: 2.   Bacteremia due to Streptococcus pneumoniae 3.  Active Problems: 4.   Essential hypertension 5.   Hx of CABG 6.   CAD (coronary artery disease) 7.   COPD with acute exacerbation ([place]) 8.   PAD (peripheral artery disease)--bifemoral bypass graft. 9.   DM2 (diabetes mellitus, type 2) ([place]) 10.   Renal failure, acute  on chronic (HCC) 11.   S/P placement of cardiac pacemaker 12.   Acute on chronic combined systolic and diastolic CHF (congestive heart failure) ([place]) 13.   Presence of permanent cardiac pacemaker 14.   Pneumonia 15.   Severe sepsis ([place]) 16.   MRSA carrier 93.   Acute hypoxemic respiratory failure (HCC) 18.   CKD (chronic kidney disease), stage IV ([place]) 19.   AKI (acute kidney injury) ([place]) 20.   Anemia in chronic kidney disease (CKD) 21.   Demand ischemia ([place]) 22.   Plan   1. Acute systolic CHF (EF 47-09%) - with volume overload and likely a combination of medical renal disease (see renal US) and cardiorenal syndrome which has prevented adequate diuresis. Could consider trial of milrinone. Not a cath candidate d/t CKD. Would place a central line today per PCCM - check Co-ox and trial of milrinone. BP will tolerate afterload reduction. Start hydralazine 25 mg TID for additional afterload reduction. Continue lasix 40 mg IV daily now and may uptitrate it if renal function allows. 2. S. pneumoniae bacteremia - Noted to have possible pacer wire vegetation on 2D echo - given S. Pneumoniae sepsis, would strongly recommend TEE on Monday to further evaluate - he may need device extraction. Consider ID consult for assistance with coverage for possible endocarditis. 3. AKI on CKDIV - appreciate nephrology recommendations -considering milrinone - would start at reduced dose of 250 mcg/kg/min for renal disease.  4. S/p Cardiac pacemaker - may need to consider explantation due to possible lead infection. Recommend TEE to evaluate further.  Time Spent Directly with Patient:  I have spent a total of 35 minutes with the patient reviewing hospital notes, telemetry, EKGs, labs and examining the patient as well as establishing an assessment and plan that was discussed personally with the patient. > 50% of time was spent in direct patient care.  Length of Stay:  LOS: 4 days   Pixie Casino, MD, [facility],  [place] Director of the [facility]  [facility] Attending Cardiologist  Direct Dial: [phone]  Fax: [phone]  Website:  [email or web address] Adi Doro 03/03/2017, 10:23 AM

## 2017-03-03 NOTE — Progress Notes (Signed)
Night cardiologist paged re: clarification on milrinone.  Awaiting return call

## 2017-03-03 NOTE — Progress Notes (Signed)
Pt with continued lethargy and confusion.  Pt. lethargic but responsive to verbal commands.  Pt able to open eyes and track staff, but does not answer questions and occasionally says one-two word phrases then closes eyes again. Staff asked patient if he was in pain, had SOB, or continued nausea.  Pt states "no."  X. Blount, NP notified and at bedside to evaluate.  No additional orders received at this time.

## 2017-03-03 NOTE — Progress Notes (Signed)
[facility] Progress Note    Assessment/ Plan:   1.  AKI superimposed on CKD IV:  Baseline Cr 2.4.  Now with AKI which is nonoliguric. Had a fairly good response to dose of IV Lasix yesterday, and as expected azotemia worsened at bit; will change to Lasix 40 IV daily.  Discussed dialysis briefly yesterday with pt and wife.  He's chronically ill and overall a marginal candidate- pt stated that dialysis would just "be prolonging death" but we didn't really make any final decisions regarding its use.  Appropriate to c/s pall care in this situation, have placed order.   2.  Acute on chronic systolic and diastolic CHF: Lasix as above.  Cardiology following.  3.  Strep pneumo pneumonia and acute hypoxic RF: per primary.  On ceftriaxone and continuing to intermittently require BiPaP.  4.  COPD exacerbation: on Solumedrol/ nebs/ #3 as above; work of breathing improved today.  5.  Dispo: pending GOC discussions  Subjective:    Weights down this AM- fairly good diuresis with 40 IV Lasix at 1800 last night.  Breathing better this AM.  Got phenergan last night and was confused.   Objective:   BP (!) 147/100 (BP Location: Left Arm)   Pulse (!) 105   Temp 98 F (36.7 C) (Oral)   Resp 16   Ht 5\' 10"  (1.778 m)   Wt 95.1 kg (209 lb 10.5 oz)   SpO2 96%   BMI 30.08 kg/m   Intake/Output Summary (Last 24 hours) at 03/03/2017 0848 Last data filed at 03/03/2017 0700 Gross per 24 hour  Intake 350 ml  Output 1200 ml  Net -850 ml   Weight change: -1.5 kg (-4.9 oz)  Physical Exam: Gen: lying in bed, has taken [place] O2 off; this was replaced CVS: regular, soft systolic murmur Resp: decreased WOB, prolonged expiratory phase, wheezes and rhonchi better than yesterday Abd: obese, protuberant, NABS Ext: LE edema improved NEURO: AAO, very mild asterixis  Imaging: US Renal  Result Date: 03/03/2017 CLINICAL DATA:  Acute onset of renal insufficiency. EXAM: RENAL / URINARY TRACT  ULTRASOUND COMPLETE COMPARISON:  CTA of the abdomen and pelvis performed 07/19/2005, and renal ultrasound performed 10/04/2006 FINDINGS: Right Kidney: Length: 9.7 cm. Diffusely increased parenchymal echogenicity is noted. Renal cortical thinning is seen. No mass or hydronephrosis visualized. Left Kidney: Length: 10.1 cm. Diffusely increased parenchymal echogenicity is noted. Renal cortical thinning is seen. No mass or hydronephrosis visualized. Bladder: Appears normal for degree of bladder distention. IMPRESSION: 1. No evidence of hydronephrosis. 2. Diffusely increased parenchymal echogenicity raises concern for medical renal disease. 3. Renal cortical thinning likely reflects chronic renal disease. Electronically Signed   By: Garald Balding M.D.   On: 03/03/2017 00:10    Labs: BMET Recent Labs  Lab 02/27/17 1338 02/28/17 0142 03/01/17 0204 03/02/17 0324 03/03/17 0320  NA 134* 131* 133* 134* 134*  K 4.4 4.5 4.8 4.3 4.8  CL 102 101 99* 101 100*  CO2 21* 20* 20* 17* 19*  GLUCOSE 108* 244* 147* 162* 177*  BUN 39* 42* 61* 78* 95*  CREATININE 2.99* 2.94* 3.45* 3.65* 3.92*  CALCIUM 8.9 8.4* 8.7* 8.6* 8.7*   CBC Recent Labs  Lab 02/28/17 0142 03/01/17 0204 03/02/17 0758 03/03/17 0320  WBC 21.5* 21.0* 15.7* 12.8*  HGB 9.5* 9.6* 9.9* 9.4*  HCT 30.5* 31.6* 30.9* 30.1*  MCV 94.4 94.0 91.7 92.6  PLT 172 234 284 281    Medications:    . aspirin EC  81  mg Oral Daily  . atorvastatin  40 mg Oral Daily  . Chlorhexidine Gluconate Cloth  6 each Topical Q0600  . escitalopram  5 mg Oral QPM  . furosemide  40 mg Intravenous Daily  . gabapentin  300 mg Oral Daily  . heparin  5,000 Units Subcutaneous Q8H  . insulin aspart  0-15 Units Subcutaneous TID WC  . insulin aspart  0-5 Units Subcutaneous QHS  . ipratropium-albuterol  3 mL Nebulization Q6H  . isosorbide mononitrate  30 mg Oral Daily  . mouth rinse  15 mL Mouth Rinse BID  . Melatonin  6 mg Oral QHS  . methylPREDNISolone (SOLU-MEDROL)  injection  60 mg Intravenous Q8H  . mupirocin ointment  1 application Nasal BID      Madelon Lips, MD [facility] pgr [phone] 03/03/2017, 8:48 AM

## 2017-03-03 NOTE — Progress Notes (Signed)
pts family have questions about plan and dialysis. Would like someone to talk to them and pt. About dialysis.

## 2017-03-03 NOTE — Progress Notes (Signed)
CVC placed by critical care.  Order noted for coox, coox drawn per order.  Noted plan for milrinone drip.  Night cardiologist paged for clarification re: plans for milrinone.  Awaiting return call from provider.

## 2017-03-03 NOTE — Progress Notes (Signed)
PROGRESS NOTE  DEONE LEIFHEIT FXT:[id number] DOB: 11-15-1931 DOA: 02/27/2017 PCP: Jani Gravel, MD Dr. Chase Caller  Brief Narrative: 74yom presented with SOB, admitted for sepsis, pneumonia, COPD exacerbation. Found to have Strep pneumo bacteremia/sepsis. Hospitalization complicated by acute resp failure requiring intermittent BiPAP, acute CHF and AKI worsening.  Assessment/Plan Strep pneumonia sepsis, bacteremia, lobar (right lower) pneumonia with right pleural effusion with associated acute hypoxic resp failure. Flu negative. - remains afebrile, stable hemodynamics, resp status improved but still needing intermittent BiPAP. - continue ceftriaxone, convert to amoxicillin as continues to improve - continue steroids  COPD exacerbation - better today, will continue steroids, nebs, oxygen, BiPAP PRN.  Acute on chronic systolic/diastolic CHF with demand ischemia. Dry weight 205 pounds per cardiology (02/16/17).  PMH CAD s/p CABG 2004; bradycardia s/p pacemaker; stroke with residual right-sided deficits - difficult situation with worsening renal function - defer to cardiology and nephrology  AKI superimposed on CKD stage IV - worsening BUN/creatinine atain 12/29.   - per nephrology/cardiology  Anemia of CKD - stable  DM type 2 on oral meds - remains stable; continue SSI, gabapentin   Worsening renal function, agree with PMT consult Check Mg  DVT prophylaxis: heparin Code Status: DNR Family Communication: wife at bedside Disposition Plan: pending    Murray Hodgkins, MD  [facility] Direct contact: [phone] --[place] [place]  --[email or web address]; password [email or web address]  7PM-7AM contact night coverage as above 03/03/2017, 10:19 AM  LOS: 4 days   Consultants:  Cardiology   Procedures:    Antimicrobials:  Vancomycin 02/27/17---> 02/28/17  Zosyn 02/27/17---> 02/28/17  Rocephin 02/28/17--->  Interval history/Subjective: Confused last night, better this AM. No  complaints, breathing better. NSVT last night and this AM.  Objective: Vitals:  Vitals:   03/03/17 0757 03/03/17 0835  BP: (!) 147/100   Pulse:    Resp:    Temp: 98 F (36.7 C)   SpO2:  96%    Exam: Constitutional:   Appears calm and comfortable Eyes:  pupils and irises appear normal Normal lids  ENMT:  grossly normal hearing  Lips appear normal Respiratory:  CTA bilaterally, no w/r/r. No wheezing today. Better air movement. Respiratory effort normal.  Cardiovascular:  RRR, no m/r/g Skin:  No rashes, lesions, ulcers palpation of skin: no induration or nodules Psychiatric:  Mental status Mood, affect appropriate Orientation to person, place, month, not year judgement and insight difficult to judge   I have personally reviewed the following:  Filed Weights   03/01/17 0350 03/02/17 0346 03/03/17 0310  Weight: 93.9 kg (207 lb 0.2 oz) 96.6 kg (212 lb 15.4 oz) 95.1 kg (209 lb 10.5 oz)   Weight change: -1.5 kg (-4.9 oz)  UOP: 1200 I/O since admission: +2.8 Last BM charted: 12/27 Foley: no Telemetry: paced with NSVT Status: SDU  Labs:  Creatinine 2.94 >> 3.45 >> 3.65 >> 3.92  WBC 21 >> 15.7 >> 12.8  Hgb stable 9.9 >> 9.4  CXR noted, NSCSLT  Scheduled Meds: . aspirin EC  81 mg Oral Daily  . atorvastatin  40 mg Oral Daily  . Chlorhexidine Gluconate Cloth  6 each Topical Q0600  . escitalopram  5 mg Oral QPM  . furosemide  40 mg Intravenous Daily  . gabapentin  300 mg Oral Daily  . heparin  5,000 Units Subcutaneous Q8H  . insulin aspart  0-15 Units Subcutaneous TID WC  . insulin aspart  0-5 Units Subcutaneous QHS  . ipratropium-albuterol  3 mL Nebulization Q6H  . isosorbide mononitrate  30  mg Oral Daily  . mouth rinse  15 mL Mouth Rinse BID  . Melatonin  6 mg Oral QHS  . methylPREDNISolone (SOLU-MEDROL) injection  60 mg Intravenous Q8H  . mupirocin ointment  1 application Nasal BID   Continuous Infusions: . cefTRIAXone (ROCEPHIN)  IV Stopped  (03/02/17 0900)    Principal Problem:   Bacteremia due to Streptococcus pneumoniae Active Problems:   Essential hypertension   Hx of CABG   CAD (coronary artery disease)   COPD with acute exacerbation (HCC)   PAD (peripheral artery disease)--bifemoral bypass graft.   DM2 (diabetes mellitus, type 2) (HCC)   Renal failure, acute on chronic (HCC)   S/P placement of cardiac pacemaker   Acute on chronic combined systolic and diastolic CHF (congestive heart failure) (HCC)   Presence of permanent cardiac pacemaker   Pneumonia   Severe sepsis (HCC)   MRSA carrier   Acute hypoxemic respiratory failure (HCC)   CKD (chronic kidney disease), stage IV (HCC)   AKI (acute kidney injury) ([place])   Anemia in chronic kidney disease (CKD)   Demand ischemia ([place])   LOS: 4 days

## 2017-03-03 NOTE — Progress Notes (Signed)
Per pt wife patient is now agreeable to hemodialysis.  Jeannette Corpus, NP notified of change in patient's wishes regarding plan of care.

## 2017-03-04 DIAGNOSIS — Z66 Do not resuscitate: Secondary | ICD-10-CM

## 2017-03-04 DIAGNOSIS — Z515 Encounter for palliative care: Secondary | ICD-10-CM

## 2017-03-04 LAB — BASIC METABOLIC PANEL
Anion gap: 14 (ref 5–15)
BUN: 115 mg/dL — AB (ref 6–20)
CHLORIDE: 104 mmol/L (ref 101–111)
CO2: 19 mmol/L — AB (ref 22–32)
CREATININE: 4.27 mg/dL — AB (ref 0.61–1.24)
Calcium: 8.4 mg/dL — ABNORMAL LOW (ref 8.9–10.3)
GFR calc Af Amer: 14 mL/min — ABNORMAL LOW (ref >60)
GFR calc non Af Amer: 12 mL/min — ABNORMAL LOW (ref >60)
Glucose, Bld: 171 mg/dL — ABNORMAL HIGH (ref 65–99)
Potassium: 4.5 mmol/L (ref 3.5–5.1)
Sodium: 137 mmol/L (ref 135–145)

## 2017-03-04 LAB — COOXEMETRY PANEL
Carboxyhemoglobin: 0.7 % (ref 0.5–1.5)
Methemoglobin: 1.3 % (ref 0.0–1.5)
O2 Saturation: 78.5 %
Total hemoglobin: 9.5 g/dL — ABNORMAL LOW (ref 12.0–16.0)

## 2017-03-04 LAB — GLUCOSE, CAPILLARY
GLUCOSE-CAPILLARY: 197 mg/dL — AB (ref 65–99)
GLUCOSE-CAPILLARY: 179 mg/dL — AB (ref 65–99)
Glucose-Capillary: 201 mg/dL — ABNORMAL HIGH (ref 65–99)
Glucose-Capillary: 203 mg/dL — ABNORMAL HIGH (ref 65–99)

## 2017-03-04 MED ORDER — FUROSEMIDE 10 MG/ML IJ SOLN
160.0000 mg | Freq: Two times a day (BID) | INTRAVENOUS | Status: DC
  Administered 2017-03-04 – 2017-03-05 (×3): 160 mg via INTRAVENOUS
  Filled 2017-03-04: qty 16
  Filled 2017-03-04: qty 2
  Filled 2017-03-04: qty 16

## 2017-03-04 MED ORDER — MILRINONE LACTATE IN DEXTROSE 20-5 MG/100ML-% IV SOLN
0.2500 ug/kg/min | INTRAVENOUS | Status: DC
  Administered 2017-03-04: 0.25 ug/kg/min via INTRAVENOUS
  Filled 2017-03-04: qty 100

## 2017-03-04 MED ORDER — SENNA 8.6 MG PO TABS
1.0000 | ORAL_TABLET | Freq: Every day | ORAL | Status: DC
  Administered 2017-03-04 – 2017-03-08 (×4): 8.6 mg via ORAL
  Filled 2017-03-04 (×4): qty 1

## 2017-03-04 MED ORDER — POLYETHYLENE GLYCOL 3350 17 G PO PACK
17.0000 g | PACK | Freq: Two times a day (BID) | ORAL | Status: DC
  Administered 2017-03-04 – 2017-03-06 (×3): 17 g via ORAL
  Filled 2017-03-04 (×7): qty 1

## 2017-03-04 NOTE — Progress Notes (Addendum)
[facility] Progress Note    Assessment/ Plan:   1.  AKI superimposed on CKD IV:  Baseline Cr 2.4.  Now with AKI which is nonoliguric, co-ox doesn't necessarily fit with pure cardiogenic shock; cardiorenal syndrome superimposed on a septic injury is plausible.  His azotemia is worsening and we are not going to achieve an adequate diuresis with IV Lasix and he is already exhibiting some low-grade uremia.  CVPs sig elevated.  Had a detailed and frank discussion with pt and his wife this AM.  They are willing to undergo a trial of dialysis.  He is too critically ill to tolerate IHD especially with the recent decline in his EF.  I discussed a time- limited trial of CRRT with him today (~48 hrs).  Discussed risks and benefits of dialysis.  I also discussed that despite our best efforts, his clinical situation may not improve.  Contacting cardiology and primary team.   2.  Acute on chronic systolic and diastolic CHF: TTE with EF down to 20-25%.  ? PPM lead vegetation.  CVPs up but co-ox in the 70s before milrinone.  Cardiology following  3.  Strep pneumo pneumonia and acute hypoxic RF: per primary.  On ceftriaxone and continuing to intermittently require BiPaP.  May need a TEE for possible lead vegetation  4.  COPD exacerbation: on Solumedrol/ nebs/ #3 as above; work of breathing improved today.  5.  Dispo: CRRT will require transfer to ICU.    Subjective:    Remains critically ill.  CVC inserted last night, milrinone initiated. Not much improvement in UOP with its initation.   BUN/Cr continue to climb   Objective:   BP 137/62   Pulse 83   Temp 98.1 F (36.7 C) (Oral)   Resp 16   Ht 5\' 10"  (1.778 m)   Wt 94.5 kg (208 lb 5.4 oz)   SpO2 94%   BMI 29.89 kg/m   Intake/Output Summary (Last 24 hours) at 03/04/2017 0756 Last data filed at 03/04/2017 0400 Gross per 24 hour  Intake 137.04 ml  Output 800 ml  Net -662.96 ml   Weight change: -0.6 kg (-5.2 oz)  Physical  Exam: Gen: lying in bed, ill appearing CVS: + ectopy today, regular rate Resp: decreased WOB, prolonged expiratory phase, wheezes and rhonchi better than yesterday Abd: obese, protuberant, NABS Ext: LE edema improved slightly NEURO: not as talkative today  Imaging: Dg Chest 2 View  Result Date: 03/03/2017 CLINICAL DATA:  Pneumonia EXAM: CHEST  2 VIEW COMPARISON:  02/27/2017 FINDINGS: The heart remains mildly enlarged. Left subclavian pacemaker device and leads are stable and intact. Postoperative changes from CABG and sternotomy are stable. Diffuse interstitial infiltrates are stable. Right pleural effusion improved. Hazy airspace disease in the right mid and lower lung is nonspecific. IMPRESSION: Stable diffuse interstitial infiltrates worrisome for edema Improved right pleural effusion. Hazy right mid and lower airspace disease. Electronically Signed   By: Marybelle Killings M.D.   On: 03/03/2017 11:16   US Renal  Result Date: 03/03/2017 CLINICAL DATA:  Acute onset of renal insufficiency. EXAM: RENAL / URINARY TRACT ULTRASOUND COMPLETE COMPARISON:  CTA of the abdomen and pelvis performed 07/19/2005, and renal ultrasound performed 10/04/2006 FINDINGS: Right Kidney: Length: 9.7 cm. Diffusely increased parenchymal echogenicity is noted. Renal cortical thinning is seen. No mass or hydronephrosis visualized. Left Kidney: Length: 10.1 cm. Diffusely increased parenchymal echogenicity is noted. Renal cortical thinning is seen. No mass or hydronephrosis visualized. Bladder: Appears normal for degree of bladder  distention. IMPRESSION: 1. No evidence of hydronephrosis. 2. Diffusely increased parenchymal echogenicity raises concern for medical renal disease. 3. Renal cortical thinning likely reflects chronic renal disease. Electronically Signed   By: Garald Balding M.D.   On: 03/03/2017 00:10   Dg Chest Port 1 View  Result Date: 03/03/2017 CLINICAL DATA:  83 year old male with central line placement for EXAM:  PORTABLE CHEST 1 VIEW COMPARISON:  Chest radiograph dated 03/03/2017 FINDINGS: There has been interval placement of a right IJ central line with tip over central SVC. No pneumothorax. Bilateral pleural effusions and associated atelectatic changes versus infiltrate noted. There is cardiomegaly with mild interstitial edema. Median sternotomy wires, CABG vascular clips, and left pectoral pacemaker device. No acute osseous pathology. IMPRESSION: 1. Interval placement of a right IJ central line the tip over central SVC. No pneumothorax. 2. Bilateral pleural effusions and a associated atelectasis versus infiltrate. The pleural effusions appear slightly larger compared to the earlier radiograph. 3. Cardiomegaly with mild pulmonary edema. Electronically Signed   By: Anner Crete M.D.   On: 03/03/2017 19:13    Labs: BMET Recent Labs  Lab 02/27/17 1338 02/28/17 0142 03/01/17 0204 03/02/17 0324 03/03/17 0320 03/04/17 0545  NA 134* 131* 133* 134* 134* 137  K 4.4 4.5 4.8 4.3 4.8 4.5  CL 102 101 99* 101 100* 104  CO2 21* 20* 20* 17* 19* 19*  GLUCOSE 108* 244* 147* 162* 177* 171*  BUN 39* 42* 61* 78* 95* 115*  CREATININE 2.99* 2.94* 3.45* 3.65* 3.92* 4.27*  CALCIUM 8.9 8.4* 8.7* 8.6* 8.7* 8.4*   CBC Recent Labs  Lab 02/28/17 0142 03/01/17 0204 03/02/17 0758 03/03/17 0320  WBC 21.5* 21.0* 15.7* 12.8*  HGB 9.5* 9.6* 9.9* 9.4*  HCT 30.5* 31.6* 30.9* 30.1*  MCV 94.4 94.0 91.7 92.6  PLT 172 234 284 281    Medications:    . aspirin EC  81 mg Oral Daily  . atorvastatin  40 mg Oral Daily  . carvedilol  3.125 mg Oral BID WC  . escitalopram  5 mg Oral QPM  . furosemide  40 mg Intravenous Daily  . gabapentin  300 mg Oral Daily  . heparin  5,000 Units Subcutaneous Q8H  . hydrALAZINE  25 mg Oral Q8H  . insulin aspart  0-15 Units Subcutaneous TID WC  . insulin aspart  0-5 Units Subcutaneous QHS  . ipratropium-albuterol  3 mL Nebulization BID  . isosorbide mononitrate  30 mg Oral Daily  .  mouth rinse  15 mL Mouth Rinse BID  . Melatonin  6 mg Oral QHS  . methylPREDNISolone (SOLU-MEDROL) injection  60 mg Intravenous Q8H  . mupirocin ointment  1 application Nasal BID      Madelon Lips, MD [facility] pgr [phone] 03/04/2017, 7:56 AM

## 2017-03-04 NOTE — Progress Notes (Signed)
Pt. Had 10 beat run of vtach. MD notified and aware. Magnesium and potassium levels have been checked.

## 2017-03-04 NOTE — Progress Notes (Signed)
Jeannette Corpus, NP hospitalist called regarding milrinone clarification.  No new orders received at this time.

## 2017-03-04 NOTE — Consult Note (Signed)
Consultation Note Date: 03/04/2017   Patient Name: [name] [name]  DOB: 06-Oct-1931  MRN: [id number]  Age / Sex: 83 y.o., male  PCP: Jani Gravel, MD Referring Physician: Samuella Cota, MD  Reason for Consultation: Establishing goals of care and Psychosocial/spiritual support  HPI/Patient Profile: 83 y.o. male  admitted on 02/27/2017 with a past medical history significant for CAD status post CABG (2004), also bradycardia status post peacemaker, chronic combined CHF, PAD, AAA status post repair 2014, prior CVA, HLD, HTN, tobacco abuse who presents on 02/27/2017 with increased confusion and lethargy   Over the past week patient complained of worsening cough productive of white phlegm without significant fevers or chills.  His wife had decreased his Lasix regimen to half his prescribed dose she reports he lost 10 pounds within the past week and contributed to his Lasix regimen.   Of note, patient was recently evaluated as an outpatient pulmonology.  Inhaled steroids were added to his current COPD regimen however both patient and wife reported no improvement in his chronic dyspnea.  Patient had recent admission in 11/18 with CHF.  He was diuresed at the time.  Has been difficult to balance CHF and CKD, had only been on Lasix 20 mg daily at home.   EF was 45% on 11/18 echo, but down to 25-30% on echo done this admission with regional wall motion abnormalities.    There is also concern for right atrial pacemaker lead vegetation. He is on ceftriaxone and Solumedrol.  Diuresis has been attempted with Lasix 40 mg IV boluses but BUN/creatinine has been steadily rising and he has had poor UOP.   Discussion is around a trial of CVVH for continued decreased renal function.  Family reports continued physical and functional decline over the last several months.  Wife tells me he has no "motivation to get off the  couch".  Patient and his wife face treatment option decisions, advanced directive decisions, and anticipatory care needs.  Long term poor prognosis 2/2 to multiple co-morbidites  Clinical Assessment and Goals of Care:   This NP Wadie Lessen reviewed medical records, received report from team, assessed the patient and then meet at the patient's bedside along with his wife  to discuss diagnosis, prognosis, GOC, EOL wishes disposition and options.  Concept of Palliative Care was discussed  A detailed discussion was had today regarding advanced directives.  Concepts specific to code status, artifical feeding and hydration, continued IV antibiotics and dialysis as an intervention, was had.    Wife was able to engage in conversation regarding mortality, limitations of medical interventions and quality of life.  Patient himself verbalized to his family no interest in dialysis.  However at this time influenced by both his wife and his son he is agreeing to a "trial" in hopes of improvement.  He was able to  verbalize that if all medical interventions are to no avail his hope would be for comfort at the end of life.  This made his wife tearful   The difference between  a aggressive medical intervention path  and a palliative comfort care path for this patient at this time was had.    Values and goals of care important to patient and family were attempted to be elicited.    Questions and concerns addressed.   Family encouraged to call with questions or concerns.  PMT will continue to support holistically.   HCPOA/wife    SUMMARY OF RECOMMENDATIONS    Code Status/Advance Care Planning:  DNR-previously documented    Palliative Prophylaxis:   Aspiration, Bowel Regimen, Delirium Protocol, Frequent Pain Assessment and Oral Care  Additional Recommendations (Limitations, Scope, Preferences):  Full Scope Treatment-both patient and his wife understand that the next few days will demonstrate  if the patient is going to respond to interventions or if further EOL decisions are at hand  Psycho-social/Spiritual:   Desire for further Chaplaincy support:no   Prognosis:   Unable to determine-dependant on desire for life prolonging measures  Discharge Planning: To Be Determined      Primary Diagnoses: Present on Admission: . Pneumonia . Severe sepsis ([place]) . (Resolved) Lactic acidosis . Essential hypertension . COPD with acute exacerbation ([place]) . (Resolved) Dyspnea . Renal failure, acute on chronic (HCC) . (Resolved) Elevated troponin . Acute on chronic combined systolic and diastolic CHF (congestive heart failure) ([place]) . (Resolved) Acute respiratory failure ([place]) . CAD (coronary artery disease) . (Resolved) CKD (chronic kidney disease) stage 3, GFR 30-59 ml/min (HCC) . PAD (peripheral artery disease)--bifemoral bypass graft. . Presence of permanent cardiac pacemaker . S/P placement of cardiac pacemaker . Bacteremia due to Streptococcus pneumoniae . (Resolved) Hyponatremia   I have reviewed the medical record, interviewed the patient and family, and examined the patient. The following aspects are pertinent.  Past Medical History:  Diagnosis Date  . AAA (abdominal aortic aneurysm) [facility])    Surgical repair June, 2014  . Acute urinary retention 04/09/2013  . CAD (coronary artery disease)    a. s/p CABG in 2005 x 4. b. low risk nuc 2016.  . Carotid artery occlusion   . Chronic anemia   . Chronic combined systolic and diastolic CHF (congestive heart failure) ([place])   . Chronic renal insufficiency   . CKD (chronic kidney disease), stage III ([place])   . Closed displaced fracture of right femoral neck ([place]) 04/08/2013  . Complete heart block ([place])   . COPD (chronic obstructive pulmonary disease) ([place]) 08/15/2012  . CVA (cerebral vascular accident) ([place]) ~ 2003   residual R leg weakness (01/22/2017)  . Dyslipidemia   . Fall at home 04/09/2013   Fx Right Hip  . HTN  (hypertension)   . Hx of CABG    2005  . Morbid obesity ([place])   . MRSA carrier 02/28/2017  . Myocardial infarction [facility])    "was told I had one before I had my heart bypass" (01/22/2017)  . Neuropathy    Right foot  . Peripheral vascular disease ([place])    a. s/p aorto-bifem bypass.  . Presence of permanent cardiac pacemaker   . S/P placement of cardiac pacemaker    a. [employer] 09/2016.  . Status post abdominal aortic aneurysm repair    June, 2014  . Tobacco abuse    quit 2014  . Type II diabetes mellitus ([place])    Social History   Socioeconomic History  . Marital status: Married    Spouse name: None  . Number of children: None  . Years of education: None  . Highest education level: None  Social  Needs  . Financial resource strain: None  . Food insecurity - worry: None  . Food insecurity - inability: None  . Transportation needs - medical: None  . Transportation needs - non-medical: None  Occupational History  . Occupation: retired  Tobacco Use  . Smoking status: Former Smoker    Packs/day: 1.00    Years: 50.00    Pack years: 50.00    Types: Cigarettes    Last attempt to quit: 08/05/2012    Years since quitting: 4.5  . Smokeless tobacco: Never Used  Substance and Sexual Activity  . Alcohol use: Yes    Comment: 01/22/2017 "quit drinking ~ 2015"  . Drug use: No  . Sexual activity: Not Currently  Other Topics Concern  . None  Social History Narrative   Pt lives in [place] with spouse.  Retired from [employer].   Family History  Problem Relation Age of Onset  . Tuberculosis Mother 46  . Coronary artery disease Father   . Varicose Veins Father    Scheduled Meds: . aspirin EC  81 mg Oral Daily  . atorvastatin  40 mg Oral Daily  . carvedilol  3.125 mg Oral BID WC  . escitalopram  5 mg Oral QPM  . furosemide  40 mg Intravenous Daily  . gabapentin  300 mg Oral Daily  . heparin  5,000 Units Subcutaneous Q8H  . hydrALAZINE  25 mg Oral Q8H  . insulin aspart  0-15  Units Subcutaneous TID WC  . insulin aspart  0-5 Units Subcutaneous QHS  . ipratropium-albuterol  3 mL Nebulization BID  . isosorbide mononitrate  30 mg Oral Daily  . mouth rinse  15 mL Mouth Rinse BID  . Melatonin  6 mg Oral QHS  . methylPREDNISolone (SOLU-MEDROL) injection  60 mg Intravenous Q8H  . mupirocin ointment  1 application Nasal BID   Continuous Infusions: . cefTRIAXone (ROCEPHIN)  IV 2 g (03/04/17 0756)  . milrinone 0.25 mcg/kg/min (03/04/17 0136)   PRN Meds:.ipratropium-albuterol, ondansetron (ZOFRAN) IV, promethazine, traMADol, zolpidem Medications Prior to Admission:  Prior to Admission medications   Medication Sig Start Date End Date Taking? Authorizing Provider  albuterol (PROVENTIL) (2.5 MG/3ML) 0.083% nebulizer solution Take 3 mLs (2.5 mg total) by nebulization every 4 (four) hours as needed for wheezing or shortness of breath. 01/24/17  Yes Duke, Tami Lin, PA  ANORO ELLIPTA 62.5-25 MCG/INH AEPB Inhale 1 puff into the lungs every morning.  07/03/16  Yes [provider]  aspirin 81 MG tablet Take 81 mg by mouth every morning.    Yes [provider]  atorvastatin (LIPITOR) 40 MG tablet Take 40 mg by mouth every morning.    Yes [provider]  Chlorphen-Phenyleph-APAP (CORICIDIN D COLD/FLU/SINUS) 2-5-325 MG TABS Take 2 tablets by mouth daily.   Yes [provider]  cholecalciferol (VITAMIN D) 1000 UNITS tablet Take 1,000 Units by mouth every morning.    Yes [provider]  escitalopram (LEXAPRO) 5 MG tablet Take 5 mg by mouth every evening.  12/08/13  Yes [provider]  furosemide (LASIX) 40 MG tablet Take 1 tablet (40 mg total) by mouth daily. Patient taking differently: Take 20 mg by mouth daily.  02/06/17 02/01/18 Yes Dunn, Dayna N, PA-C  gabapentin (NEURONTIN) 300 MG capsule Take 300 mg by mouth every morning.    Yes [provider]  hydrALAZINE (APRESOLINE) 50 MG tablet Take 1 tablet (50 mg total) by  mouth 3 (three) times daily. 09/12/16  Yes Baldwin [place],  PA-C  isosorbide mononitrate (IMDUR) 30 MG 24 hr tablet Take 1 tablet (30 mg total) by mouth daily. 02/06/17 05/07/17 Yes Dunn, Dayna N, PA-C  Melatonin 5 MG TABS Take 1 tablet by mouth at bedtime.   Yes [provider]  Mometasone Furoate [facility] HFA) 100 MCG/ACT AERO Inhale 2 puffs into the lungs daily. Patient taking differently: Inhale 2 puffs into the lungs at bedtime.  02/21/17  Yes Brand Males, MD  montelukast (SINGULAIR) 10 MG tablet Take 10 mg by mouth every evening.  08/09/16  Yes [provider]  oxybutynin (DITROPAN-XL) 5 MG 24 hr tablet Take 5 mg by mouth at bedtime.  07/01/12  Yes [provider]  potassium chloride (K-DUR) 10 MEQ tablet Take 1 tablet (10 mEq total) by mouth daily. 02/06/17 02/01/18 Yes Dunn, Dayna N, PA-C  PROAIR HFA 108 (90 Base) MCG/ACT inhaler Inhale 2 puffs into the lungs every 4 (four) hours as needed for wheezing or shortness of breath. 01/24/17  Yes Duke, Tami Lin, PA  TRADJENTA 5 MG TABS tablet Take 5 mg by mouth every morning. 09/04/16  Yes [provider]  traMADol (ULTRAM) 50 MG tablet Take 1 tablet by mouth every 6 (six) hours as needed for moderate pain.  05/28/13  Yes [provider]  vitamin B-12 (CYANOCOBALAMIN) 100 MCG tablet Take 100 mcg by mouth every morning.    Yes [provider]  zolpidem (AMBIEN) 10 MG tablet Take 5 mg by mouth at bedtime.  04/11/13  Yes Robbie Lis, MD  Mometasone Furoate [facility] HFA) 100 MCG/ACT AERO Inhale 2 puffs into the lungs daily. Patient not taking: Reported on 02/27/2017 02/21/17   Brand Males, MD   No Known Allergies Review of Systems  Constitutional:        Reports generaliized discomfort  Neurological: Positive for weakness.    Physical Exam  Constitutional: He appears well-developed.  Cardiovascular: Tachycardia present.  Pulmonary/Chest: He has decreased breath sounds in the right  lower field and the left lower field.  Skin: Skin is warm and dry.    Vital Signs: BP 112/68   Pulse 93   Temp 98.1 F (36.7 C) (Oral)   Resp 16   Ht 5\' 10"  (1.778 m)   Wt 94.5 kg (208 lb 5.4 oz)   SpO2 94%   BMI 29.89 kg/m  Pain Assessment: 0-10 POSS *See Group Information*: 1-Acceptable,Awake and alert Pain Score: 0-No pain   SpO2: SpO2: 94 % O2 Device:SpO2: 94 % O2 Flow Rate: .O2 Flow Rate (L/min): 3.5 L/min  IO: Intake/output summary:   Intake/Output Summary (Last 24 hours) at 03/04/2017 1478 Last data filed at 03/04/2017 0400 Gross per 24 hour  Intake 137.04 ml  Output 800 ml  Net -662.96 ml    LBM: Last BM Date: 02/26/17 Baseline Weight: Weight: 91.6 kg (202 lb) Most recent weight: Weight: 94.5 kg (208 lb 5.4 oz)     Palliative Assessment/Data: 30 %    Discussed with Dr Aundra Dubin and Dr Milon Dikes  This NP will f/u in the morning for continued support  Time In: 0800 Time Out: 0915 Time Total: 75 minutes Greater than 50%  of this time was spent counseling and coordinating care related to the above assessment and plan.  Signed by: Wadie Lessen, NP   Please contact Palliative Medicine Team phone at [phone] for questions and concerns.  For individual provider: See Shea Evans

## 2017-03-04 NOTE — Progress Notes (Signed)
PROGRESS NOTE  [name] [name] IFO:[id number] DOB: [birth year] DOA: 02/27/2017 PCP: Jani Gravel, MD Dr. Chase Caller  Brief Narrative: 45yom presented with SOB, admitted for sepsis, pneumonia, COPD exacerbation. Found to have Strep pneumo bacteremia/sepsis. Hospitalization complicated by acute resp failure requiring intermittent BiPAP, acute CHF and AKI worsening.  Assessment/Plan AKI superimposed on CKD stage IV - renal fxn continues to worsen.  - nephrology plans 48hour trial of CRRT  Acute on chronic systolic/diastolic CHF with demand ischemia. Dry weight 205 pounds per cardiology (02/16/17).  PMH CAD s/p CABG 2004; bradycardia s/p pacemaker; stroke with residual right-sided deficits - difficult to treat with AKI, decreasing urine output - milrinone, Rx per cardiology. AHF to see.  Strep pneumonia sepsis, bacteremia, lobar (right lower) pneumonia with right pleural effusion with associated acute hypoxic resp failure. Flu negative. - remains afebrile, stable hemodynamics, resp status improved but still needing intermittent BiPAP. - continue ceftriaxone, convert to amoxicillin as continues to improve - continue steroids  Possible pacer lead vegetation - cardiology rec TEE next week; unclear whether a candidate for extration. - continue IV abx - recheck BC  COPD exacerbation - more wheezing today; continue nebs, steroids, oxygen, BiPAP PRN  Anemia of CKD - stable  DM type 2 on oral meds - remains stable 12/30; continue SSI, gabapentin 12/30  Discussed with Drs. [place] and [place]; agree with transfer to ICU for trial of CRRT, consult to AHF team; will continue IV abx. TEE next week pending clinical progress.  Discussed with Dr. Milon Dikes, PCCM, reviewed case, request for ICU transfer. He will see patient today and assume care in ICU, place dialysis line. He requests I not put in transfer order to ICU.  DVT prophylaxis: heparin Code Status: DNR Family Communication: wife at bedside  12/30 Disposition Plan: pending    Murray Hodgkins, MD  [facility] Direct contact: [phone] --Via amion app OR  --[email or web address]; password [email or web address]  7PM-7AM contact night coverage as above 03/04/2017, 8:22 AM  LOS: 5 days   Consultants:  Cardiology   Procedures:    Antimicrobials:  Vancomycin 02/27/17---> 02/28/17  Zosyn 02/27/17---> 02/28/17  Rocephin 02/28/17--->  Interval history/Subjective: Feels ok, breathing better, some productive cough now. No n/v. No abdominal pain.  Objective: Vitals:  Vitals:   03/04/17 0725 03/04/17 0756  BP:  112/68  Pulse:  93  Resp:    Temp:    SpO2: 94%     Exam: Constitutional:   . Appears calm, ill, uncomfortable but not toxic Eyes:  . pupils and irises appear normal . Normal lids   ENMT:  . grossly normal hearing  . Lips appear normal Neck:  . Central line noted Respiratory:  . Bilateral wheeze, prolonged expiration, no r/r. Mild to moderate increased resp effort Cardiovascular:  . RRR, no m/r/g . No LE extremity edema   . Telemetry paced, NSVT Abdomen:  . soft Skin:  . No rashes, lesions, ulcers . palpation of skin: no induration or nodules Psychiatric:  . Mental status o Mood, affect appropriate   I have personally reviewed the following:  Filed Weights   03/02/17 0346 03/03/17 0310 03/04/17 0308  Weight: 96.6 kg (212 lb 15.4 oz) 95.1 kg (209 lb 10.5 oz) 94.5 kg (208 lb 5.4 oz)   Weight change: -0.6 kg (-5.2 oz)  UOP: 663 I/O since admission: +1.3 Last BM charted: 12/27 Foley: condom cath Telemetry: paced with NSVT Status: SDU >> ICU  Labs:  Creatinine 2.94 >> 3.45 >> 3.65 >> 3.92 >> 4.27  WBC 21 >>  15.7 >> 12.8  Hgb stable 9.9 >> 9.4   Scheduled Meds: . aspirin EC  81 mg Oral Daily  . atorvastatin  40 mg Oral Daily  . carvedilol  3.125 mg Oral BID WC  . escitalopram  5 mg Oral QPM  . furosemide  40 mg Intravenous Daily  . gabapentin  300 mg Oral Daily  . heparin  5,000  Units Subcutaneous Q8H  . hydrALAZINE  25 mg Oral Q8H  . insulin aspart  0-15 Units Subcutaneous TID WC  . insulin aspart  0-5 Units Subcutaneous QHS  . ipratropium-albuterol  3 mL Nebulization BID  . isosorbide mononitrate  30 mg Oral Daily  . mouth rinse  15 mL Mouth Rinse BID  . Melatonin  6 mg Oral QHS  . methylPREDNISolone (SOLU-MEDROL) injection  60 mg Intravenous Q8H  . mupirocin ointment  1 application Nasal BID   Continuous Infusions: . cefTRIAXone (ROCEPHIN)  IV 2 g (03/04/17 0756)  . milrinone 0.25 mcg/kg/min (03/04/17 0136)    Principal Problem:   Bacteremia due to Streptococcus pneumoniae Active Problems:   Essential hypertension   Hx of CABG   CAD (coronary artery disease)   COPD with acute exacerbation (HCC)   PAD (peripheral artery disease)--bifemoral bypass graft.   DM2 (diabetes mellitus, type 2) (HCC)   Renal failure, acute on chronic (HCC)   S/P placement of cardiac pacemaker   Acute on chronic combined systolic and diastolic CHF (congestive heart failure) (HCC)   Presence of permanent cardiac pacemaker   Pneumonia   Severe sepsis (HCC)   MRSA carrier   Acute hypoxemic respiratory failure (HCC)   CKD (chronic kidney disease), stage IV (HCC)   AKI (acute kidney injury) ([place])   Anemia in chronic kidney disease (CKD)   Demand ischemia ([place])   LOS: 5 days    Time >35 minutes, >50% counseling (discussion of care, dialysis, AHF consult, PCCM consult with wife/patient) and discussion with specialists Drs. [place], [place] and [place].

## 2017-03-04 NOTE — Progress Notes (Signed)
Were consulted to place a dialysis catheter and move the patient to the ICU for CRRT by the time and came to the floor cardiology had a different plan of starting the patient denies dose Lasix monitor his output they would like to hold off any dialysis plans for now communicated with the primary team nephrology and cardiology will hold off transferring the patient to the ICU or placing any invasive procedures they will update [place] with a urine output and if the patient needs to come to the ICU at a later time for dialysis catheter or CRRT. Patient is DNR patient and his wife are fully aware of the plan.

## 2017-03-04 NOTE — Progress Notes (Signed)
Called [place] answering service at [phone] to page cardiologist on call.  Dr. Alveta Heimlich returned page.  Orders received for milrinone.

## 2017-03-04 NOTE — Consult Note (Signed)
Advanced Heart Failure Team Consult Note    Reason for Consultation: CHF, AKI on CKD  HPI:    [name] [name] is seen today for evaluation of CHF at the request of Dr. Debara Pickett.   83 yo with CKD stage IV, PAD, CHF, CAD s/p CABG, CHB s/p MDT PPM, COPD who was admitted with Strep pneumo PNA, COPD exacerbation, and acute/chronic systolic CHF.    Patient had recent admission in 11/18 with CHF.  He was diuresed at the time.  Has been difficult to balance CHF and CKD, had only been on Lasix 20 mg daily at home.  He was admitted on 12/25 with respiratory distress.  Found to have RLL PNA with Strep pneumoniae bacteremia.  Also with suspected AECOPD and acute/chronic systolic CHF.  EF was 45% on 11/18 echo, but down to 25-30% on echo done this admission with regional wall motion abnormalities.  There was also concern for right atrial pacemaker lead vegetation. He is on ceftriaxone and Solumedrol.  Diuresis has been attempted with Lasix 40 mg IV boluses but BUN/creatinine has been steadily rising and he has had poor UOP.  Today, CVP 18.  His co-ox done last night prior to milrinone was 73% and is 78.5% today.  BP running 110s-130s/70s-90s.  He is comfortable at rest, short of breath when he moves around. No chest pain.  He is now afebrile with WBCs trending down.  He is in NSR currently but primarily RV pacing.   Nephrology is following, tentative plans for 48 hr trial of CVVH.   Review of Systems: All systems reviewed and negative except as per HPI.   Home Medications Prior to Admission medications   Medication Sig Start Date End Date Taking? Authorizing Provider  albuterol (PROVENTIL) (2.5 MG/3ML) 0.083% nebulizer solution Take 3 mLs (2.5 mg total) by nebulization every 4 (four) hours as needed for wheezing or shortness of breath. 01/24/17  Yes Duke, Tami Lin, PA  ANORO ELLIPTA 62.5-25 MCG/INH AEPB Inhale 1 puff into the lungs every morning.  07/03/16  Yes [provider]  aspirin 81 MG  tablet Take 81 mg by mouth every morning.    Yes [provider]  atorvastatin (LIPITOR) 40 MG tablet Take 40 mg by mouth every morning.    Yes [provider]  Chlorphen-Phenyleph-APAP (CORICIDIN D COLD/FLU/SINUS) 2-5-325 MG TABS Take 2 tablets by mouth daily.   Yes [provider]  cholecalciferol (VITAMIN D) 1000 UNITS tablet Take 1,000 Units by mouth every morning.    Yes [provider]  escitalopram (LEXAPRO) 5 MG tablet Take 5 mg by mouth every evening.  12/08/13  Yes [provider]  furosemide (LASIX) 40 MG tablet Take 1 tablet (40 mg total) by mouth daily. Patient taking differently: Take 20 mg by mouth daily.  02/06/17 02/01/18 Yes Dunn, Dayna N, PA-C  gabapentin (NEURONTIN) 300 MG capsule Take 300 mg by mouth every morning.    Yes [provider]  hydrALAZINE (APRESOLINE) 50 MG tablet Take 1 tablet (50 mg total) by mouth 3 (three) times daily. 09/12/16  Yes Baldwin [place], PA-C  isosorbide mononitrate (IMDUR) 30 MG 24 hr tablet Take 1 tablet (30 mg total) by mouth daily. 02/06/17 05/07/17 Yes Dunn, Dayna N, PA-C  Melatonin 5 MG TABS Take 1 tablet by mouth at bedtime.   Yes [provider]  Mometasone Furoate [facility] HFA) 100 MCG/ACT AERO Inhale 2 puffs into the lungs daily. Patient taking differently: Inhale 2 puffs into the  lungs at bedtime.  02/21/17  Yes Brand Males, MD  montelukast (SINGULAIR) 10 MG tablet Take 10 mg by mouth every evening.  08/09/16  Yes [provider]  oxybutynin (DITROPAN-XL) 5 MG 24 hr tablet Take 5 mg by mouth at bedtime.  07/01/12  Yes [provider]  potassium chloride (K-DUR) 10 MEQ tablet Take 1 tablet (10 mEq total) by mouth daily. 02/06/17 02/01/18 Yes Dunn, Dayna N, PA-C  PROAIR HFA 108 (90 Base) MCG/ACT inhaler Inhale 2 puffs into the lungs every 4 (four) hours as needed for wheezing or shortness of breath. 01/24/17  Yes Duke, Tami Lin, PA  TRADJENTA 5 MG TABS tablet  Take 5 mg by mouth every morning. 09/04/16  Yes [provider]  traMADol (ULTRAM) 50 MG tablet Take 1 tablet by mouth every 6 (six) hours as needed for moderate pain.  05/28/13  Yes [provider]  vitamin B-12 (CYANOCOBALAMIN) 100 MCG tablet Take 100 mcg by mouth every morning.    Yes [provider]  zolpidem (AMBIEN) 10 MG tablet Take 5 mg by mouth at bedtime.  04/11/13  Yes Robbie Lis, MD  Mometasone Furoate [facility] HFA) 100 MCG/ACT AERO Inhale 2 puffs into the lungs daily. Patient not taking: Reported on 02/27/2017 02/21/17   Brand Males, MD    Past Medical History: 1. CAD: s/p CABG x 4 2005. No recent cath.  2. CKD: Stage IV.  3. PAD: S/p aortobifemoral bypass.  4. AAA: s/p repair in 2014. 5. Complete heart block: Medtronic dual chamber PPM 7/18.  6. CVA 7. HTN 8. Type II diabetes 9. Hyperlipidemia 10. COPD 59. CHF:  - Echo 7/18: EF 55-60%.  - Echo 11/18: EF 45% - Echo 12/18: EF 25-30% with wall motion abnormalities, ?RA lead vegetation.   Past Surgical History: Past Surgical History:  Procedure Laterality Date  . ABDOMINAL AORTAGRAM N/A 08/07/2012   Procedure: ABDOMINAL AORTAGRAM;  Surgeon: Rosetta Posner, MD;  Location[facility] CATH LAB;  Service: Cardiovascular;  Laterality: N/A;  . AORTA - BILATERAL FEMORAL ARTERY BYPASS GRAFT N/A 08/09/2012   Procedure: AORTA BIFEMORAL BYPASS GRAFT;  Surgeon: Rosetta Posner, MD;  Location: [facility];  Service: Vascular;  Laterality: N/A;  . CORONARY ARTERY BYPASS GRAFT  2005   CABG X"5"  . HIP ARTHROPLASTY Right 04/09/2013   Procedure: ARTHROPLASTY BIPOLAR HIP;  Surgeon: Gearlean Alf, MD;  Location: [facility] ORS;  Service: Orthopedics;  Laterality: Right;  . INSERT / REPLACE / REMOVE PACEMAKER    . JOINT REPLACEMENT    . LUMBAR DISC SURGERY  1977   Lumbar HNP surgery  . PACEMAKER IMPLANT N/A 09/11/2016   Procedure: Pacemaker Implant;  Surgeon: Deboraha Sprang, MD;  Location: [facility] CV LAB;  Service: Cardiovascular;   Laterality: N/A;  . TONSILLECTOMY  1940    Family History: Family History  Problem Relation Age of Onset  . Tuberculosis Mother 8  . Coronary artery disease Father   . Varicose Veins Father     Social History: Social History   Socioeconomic History  . Marital status: Married    Spouse name: None  . Number of children: None  . Years of education: None  . Highest education level: None  Social Needs  . Financial resource strain: None  . Food insecurity - worry: None  . Food insecurity - inability: None  . Transportation needs - medical: None  . Transportation needs - non-medical: None  Occupational History  . Occupation: retired  Tobacco  Use  . Smoking status: Former Smoker    Packs/day: 1.00    Years: 50.00    Pack years: 50.00    Types: Cigarettes    Last attempt to quit: 08/05/2012    Years since quitting: 4.5  . Smokeless tobacco: Never Used  Substance and Sexual Activity  . Alcohol use: Yes    Comment: 01/22/2017 "quit drinking ~ 2015"  . Drug use: No  . Sexual activity: Not Currently  Other Topics Concern  . None  Social History Narrative   Pt lives in [place] with spouse.  Retired from [employer].    Allergies:  No Known Allergies  Objective:    Vital Signs:   Temp:  [97.6 F (36.4 C)-98.2 F (36.8 C)] 98.1 F (36.7 C) (12/30 0308) Pulse Rate:  [83-95] 93 (12/30 0756) Resp:  [14-18] 16 (12/30 0600) BP: (112-143)/(60-99) 112/68 (12/30 0756) SpO2:  [93 %-100 %] 94 % (12/30 0725) Weight:  [208 lb 5.4 oz (94.5 kg)] 208 lb 5.4 oz (94.5 kg) (12/30 0308) Last BM Date: 02/26/17  Weight change: Filed Weights   03/02/17 0346 03/03/17 0310 03/04/17 0308  Weight: 212 lb 15.4 oz (96.6 kg) 209 lb 10.5 oz (95.1 kg) 208 lb 5.4 oz (94.5 kg)    Intake/Output:   Intake/Output Summary (Last 24 hours) at 03/04/2017 0900 Last data filed at 03/04/2017 0400 Gross per 24 hour  Intake 137.04 ml  Output 800 ml  Net -662.96 ml      Physical Exam    General:   Chronically ill-appearing HEENT: normal Neck: supple. JVP difficult, appears elevated. Carotids 2+ bilat; no bruits. No lymphadenopathy or thyromegaly appreciated. Cor: PMI nondisplaced. Irregular rate & rhythm. No rubs, gallops or murmurs. Lungs: Decreased breath sounds at bases bilaterally.  Abdomen: soft, nontender, nondistended. No hepatosplenomegaly. No bruits or masses. Good bowel sounds. Extremities: no cyanosis, clubbing, rash, edema Neuro: alert & orientedx3, cranial nerves grossly intact. moves all 4 extremities w/o difficulty. Affect pleasant   Telemetry   NSR with RV pacing and PVCs  EKG    NSR with LBBB (?RV pacing)  Labs   Basic Metabolic Panel: Recent Labs  Lab 02/28/17 0142 03/01/17 0204 03/02/17 0324 03/03/17 0320 03/04/17 0545  NA 131* 133* 134* 134* 137  K 4.5 4.8 4.3 4.8 4.5  CL 101 99* 101 100* 104  CO2 20* 20* 17* 19* 19*  GLUCOSE 244* 147* 162* 177* 171*  BUN 42* 61* 78* 95* 115*  CREATININE 2.94* 3.45* 3.65* 3.92* 4.27*  CALCIUM 8.4* 8.7* 8.6* 8.7* 8.4*  MG  --   --   --  2.8*  --     Liver Function Tests: Recent Labs  Lab 02/27/17 1338  AST 27  ALT 12*  ALKPHOS 77  BILITOT 1.3*  PROT 7.8  ALBUMIN 3.3*   No results for input(s): LIPASE, AMYLASE in the last 168 hours. No results for input(s): AMMONIA in the last 168 hours.  CBC: Recent Labs  Lab 02/27/17 1338 02/28/17 0142 03/01/17 0204 03/02/17 0758 03/03/17 0320  WBC 20.8* 21.5* 21.0* 15.7* 12.8*  HGB 10.7* 9.5* 9.6* 9.9* 9.4*  HCT 34.3* 30.5* 31.6* 30.9* 30.1*  MCV 94.5 94.4 94.0 91.7 92.6  PLT 200 172 234 284 281    Cardiac Enzymes: Recent Labs  Lab 02/27/17 1338 02/27/17 2012  TROPONINI 0.14* 0.13*    BNP: BNP (last 3 results) Recent Labs    01/22/17 2031 02/27/17 1338 03/01/17 1007  BNP 811.1* 1,214.4* 1,858.9*    ProBNP (  last 3 results) Recent Labs    02/06/17 1302  PROBNP 2,488*     CBG: Recent Labs  Lab 03/03/17 0755 03/03/17 1318  03/03/17 1748 03/03/17 2121 03/04/17 0846  GLUCAP 178* 201* 152* 135* 197*    Coagulation Studies: No results for input(s): LABPROT, INR in the last 72 hours.   Imaging   Dg Chest 2 View  Result Date: 03/03/2017 CLINICAL DATA:  Pneumonia EXAM: CHEST  2 VIEW COMPARISON:  02/27/2017 FINDINGS: The heart remains mildly enlarged. Left subclavian pacemaker device and leads are stable and intact. Postoperative changes from CABG and sternotomy are stable. Diffuse interstitial infiltrates are stable. Right pleural effusion improved. Hazy airspace disease in the right mid and lower lung is nonspecific. IMPRESSION: Stable diffuse interstitial infiltrates worrisome for edema Improved right pleural effusion. Hazy right mid and lower airspace disease. Electronically Signed   By: Marybelle Killings M.D.   On: 03/03/2017 11:16   Dg Chest Port 1 View  Result Date: 03/03/2017 CLINICAL DATA:  83 year old male with central line placement for EXAM: PORTABLE CHEST 1 VIEW COMPARISON:  Chest radiograph dated 03/03/2017 FINDINGS: There has been interval placement of a right IJ central line with tip over central SVC. No pneumothorax. Bilateral pleural effusions and associated atelectatic changes versus infiltrate noted. There is cardiomegaly with mild interstitial edema. Median sternotomy wires, CABG vascular clips, and left pectoral pacemaker device. No acute osseous pathology. IMPRESSION: 1. Interval placement of a right IJ central line the tip over central SVC. No pneumothorax. 2. Bilateral pleural effusions and a associated atelectasis versus infiltrate. The pleural effusions appear slightly larger compared to the earlier radiograph. 3. Cardiomegaly with mild pulmonary edema. Electronically Signed   By: Anner Crete M.D.   On: 03/03/2017 19:13      Medications:     Current Medications: . aspirin EC  81 mg Oral Daily  . atorvastatin  40 mg Oral Daily  . carvedilol  3.125 mg Oral BID WC  . escitalopram  5 mg  Oral QPM  . gabapentin  300 mg Oral Daily  . heparin  5,000 Units Subcutaneous Q8H  . hydrALAZINE  25 mg Oral Q8H  . insulin aspart  0-15 Units Subcutaneous TID WC  . insulin aspart  0-5 Units Subcutaneous QHS  . ipratropium-albuterol  3 mL Nebulization BID  . isosorbide mononitrate  30 mg Oral Daily  . mouth rinse  15 mL Mouth Rinse BID  . Melatonin  6 mg Oral QHS  . methylPREDNISolone (SOLU-MEDROL) injection  60 mg Intravenous Q8H  . mupirocin ointment  1 application Nasal BID  . polyethylene glycol  17 g Oral BID  . senna  1 tablet Oral QHS     Infusions: . cefTRIAXone (ROCEPHIN)  IV 2 g (03/04/17 0756)  . furosemide         Patient Profile   83 yo with CKD stage IV, PAD, CHF, CAD s/p CABG, CHB s/p MDT PPM, COPD who was admitted with Strep pneumo PNA, COPD exacerbation, and acute/chronic systolic CHF.    Assessment/Plan   1. Acute on chronic systolic CHF: Echo this admission with EF 25-30%, down from about 45% in 11/18.  He has regional wall motion abnormalities.  On exam, he is volume overloaded.  CVP is 18 this morning.  He has diuresed poorly in setting of AKI on CKD stage IV.  Good co-ox at 73% pre-milrinone.  Milrinone started overnight with co-ox 78.5% this morning.  I do not think AKI is driven primarily by  CHF/low output in this situation.  BP is normal to mildly elevated.  - I do not think that milrinone is helping much here, pre-milrinone co-ox suggested adequate cardiac output.  With progressive renal dysfunction, would stop milrinone at this point.  - He has had only intermittent boluses of Lasix 40 mg IV.  Would try higher dose Lasix, wrote for 160 mg IV bid.  If UOP remains poor, agree that CVVH trial would be warranted.  - Can continue hydralazine/Imdur at current dosing for afterload reduction.  Continue Coreg at current dosing.  - If he recovers from the current episode, will need to consider upgrade for BiV pacing.  2. AKI on CKD stage IV: BUN/creatinine  steadily rising with poor diuresis.  I do not think that this is primarily cardiorenal though CHF certainly plays a role.  Co-ox is excellent (even pre-milrinone) and BP is currently stable to mildly elevated.  It is possible that he was transiently hypotensive in setting of S pneumoniae sepsis with ATN superimposed on his baseline CKD stage IV.  - As above, will try higher dose of Lasix this morning.  If this is unsuccessful, agree with trial of CVVH (PCCM notified and will place line if needed).  3. ID: Strep pneumoniae PNA, treating with ceftriaxone.  ?RA lead vegetation.  - Would repeat blood cultures to make sure bacteremia is cleared.  - Will need TEE to look at [facility] wires, would wait until we have volume better controlled.  If he has lead vegetation and we are proceeding with aggressive care, may need PPM out which would be a problem if he is PPM dependent (will need to discuss with EP).  4. COPD: Treating for AECOPD per IM.  He is on Solumedrol IV.  No wheezing on exam.  5. CAD: s/p CABG 2005.  No recent cath.  No chest pain but possible progressive coronary disease given fall in EF with WMAs.   - No current ACS so no cath at this time given AKI.  - Continue ASA and statin.  6. PAD: s/p aortobifemoral bypass.  7. He is current DNR.  He met with palliative care today.  Willing to undergo 48 hour trial of CVVH if needed but does not want long-term HD.   Length of Stay: 5  Loralie Champagne, MD  03/04/2017, 9:00 AM  Advanced Heart Failure Team Pager [phone] (M-F; [place])  Please contact [facility] for night-coverage after hours (4p -7a ) and weekends on [email or web address]

## 2017-03-04 NOTE — Progress Notes (Signed)
Night cardiologist paged regarding clarification of milrinone.  Awaiting return call.

## 2017-03-05 ENCOUNTER — Inpatient Hospital Stay (HOSPITAL_COMMUNITY): Payer: Medicare Other

## 2017-03-05 ENCOUNTER — Ambulatory Visit: Payer: Federal, State, Local not specified - PPO | Admitting: Physician Assistant

## 2017-03-05 LAB — CBC WITH DIFFERENTIAL/PLATELET
BASOS ABS: 0 10*3/uL (ref 0.0–0.1)
Basophils Relative: 0 %
EOS ABS: 0 10*3/uL (ref 0.0–0.7)
EOS PCT: 0 %
HCT: 28 % — ABNORMAL LOW (ref 39.0–52.0)
HEMOGLOBIN: 8.8 g/dL — AB (ref 13.0–17.0)
LYMPHS ABS: 0.5 10*3/uL — AB (ref 0.7–4.0)
LYMPHS PCT: 4 %
MCH: 29.1 pg (ref 26.0–34.0)
MCHC: 31.4 g/dL (ref 30.0–36.0)
MCV: 92.7 fL (ref 78.0–100.0)
Monocytes Absolute: 0.2 10*3/uL (ref 0.1–1.0)
Monocytes Relative: 2 %
NEUTROS PCT: 94 %
Neutro Abs: 10.7 10*3/uL — ABNORMAL HIGH (ref 1.7–7.7)
PLATELETS: 313 10*3/uL (ref 150–400)
RBC: 3.02 MIL/uL — AB (ref 4.22–5.81)
RDW: 15.1 % (ref 11.5–15.5)
WBC: 11.3 10*3/uL — AB (ref 4.0–10.5)

## 2017-03-05 LAB — CARBOXYHEMOGLOBIN - COOX: CARBOXYHEMOGLOBIN: 0.8 % (ref 0.5–1.5)

## 2017-03-05 LAB — RENAL FUNCTION PANEL
ANION GAP: 14 (ref 5–15)
Albumin: 2.8 g/dL — ABNORMAL LOW (ref 3.5–5.0)
BUN: 109 mg/dL — ABNORMAL HIGH (ref 6–20)
CHLORIDE: 101 mmol/L (ref 101–111)
CO2: 23 mmol/L (ref 22–32)
CREATININE: 3.82 mg/dL — AB (ref 0.61–1.24)
Calcium: 8.2 mg/dL — ABNORMAL LOW (ref 8.9–10.3)
GFR, EST AFRICAN AMERICAN: 15 mL/min — AB (ref >60)
GFR, EST NON AFRICAN AMERICAN: 13 mL/min — AB (ref >60)
Glucose, Bld: 117 mg/dL — ABNORMAL HIGH (ref 65–99)
POTASSIUM: 4.4 mmol/L (ref 3.5–5.1)
Phosphorus: 7.5 mg/dL — ABNORMAL HIGH (ref 2.5–4.6)
Sodium: 138 mmol/L (ref 135–145)

## 2017-03-05 LAB — BASIC METABOLIC PANEL
Anion gap: 15 (ref 5–15)
BUN: 136 mg/dL — ABNORMAL HIGH (ref 6–20)
CHLORIDE: 100 mmol/L — AB (ref 101–111)
CO2: 22 mmol/L (ref 22–32)
CREATININE: 4.79 mg/dL — AB (ref 0.61–1.24)
Calcium: 8.2 mg/dL — ABNORMAL LOW (ref 8.9–10.3)
GFR, EST AFRICAN AMERICAN: 12 mL/min — AB (ref >60)
GFR, EST NON AFRICAN AMERICAN: 10 mL/min — AB (ref >60)
Glucose, Bld: 203 mg/dL — ABNORMAL HIGH (ref 65–99)
POTASSIUM: 4.3 mmol/L (ref 3.5–5.1)
SODIUM: 137 mmol/L (ref 135–145)

## 2017-03-05 LAB — COOXEMETRY PANEL
CARBOXYHEMOGLOBIN: 1.3 % (ref 0.5–1.5)
METHEMOGLOBIN: 1 % (ref 0.0–1.5)
O2 SAT: 87.5 %
TOTAL HEMOGLOBIN: 9.5 g/dL — AB (ref 12.0–16.0)

## 2017-03-05 LAB — GLUCOSE, CAPILLARY
GLUCOSE-CAPILLARY: 155 mg/dL — AB (ref 65–99)
GLUCOSE-CAPILLARY: 116 mg/dL — AB (ref 65–99)
GLUCOSE-CAPILLARY: 144 mg/dL — AB (ref 65–99)
Glucose-Capillary: 190 mg/dL — ABNORMAL HIGH (ref 65–99)

## 2017-03-05 LAB — IRON AND TIBC
IRON: 39 ug/dL — AB (ref 45–182)
Saturation Ratios: 18 % (ref 17.9–39.5)
TIBC: 217 ug/dL — AB (ref 250–450)
UIBC: 178 ug/dL

## 2017-03-05 LAB — FERRITIN: Ferritin: 344 ng/mL — ABNORMAL HIGH (ref 24–336)

## 2017-03-05 MED ORDER — METHYLPREDNISOLONE SODIUM SUCC 40 MG IJ SOLR
40.0000 mg | Freq: Two times a day (BID) | INTRAMUSCULAR | Status: DC
  Administered 2017-03-05 – 2017-03-08 (×6): 40 mg via INTRAVENOUS
  Filled 2017-03-05 (×6): qty 1

## 2017-03-05 MED ORDER — WHITE PETROLATUM EX OINT
TOPICAL_OINTMENT | CUTANEOUS | Status: AC
  Administered 2017-03-05: 15:00:00
  Filled 2017-03-05: qty 28.35

## 2017-03-05 MED ORDER — PRISMASOL BGK 4/2.5 32-4-2.5 MEQ/L IV SOLN
INTRAVENOUS | Status: DC
  Administered 2017-03-05 – 2017-03-06 (×2): via INTRAVENOUS_CENTRAL
  Filled 2017-03-05 (×3): qty 5000

## 2017-03-05 MED ORDER — HEPARIN SODIUM (PORCINE) 1000 UNIT/ML DIALYSIS
1000.0000 [IU] | INTRAMUSCULAR | Status: DC | PRN
  Filled 2017-03-05: qty 6

## 2017-03-05 MED ORDER — PRISMASOL BGK 4/2.5 32-4-2.5 MEQ/L IV SOLN
INTRAVENOUS | Status: DC
  Administered 2017-03-05 – 2017-03-07 (×4): via INTRAVENOUS_CENTRAL
  Filled 2017-03-05 (×5): qty 5000

## 2017-03-05 MED ORDER — SODIUM CHLORIDE 0.9 % FOR CRRT
INTRAVENOUS_CENTRAL | Status: DC | PRN
  Filled 2017-03-05: qty 1000

## 2017-03-05 MED ORDER — PRISMASOL BGK 4/2.5 32-4-2.5 MEQ/L IV SOLN
INTRAVENOUS | Status: DC
  Administered 2017-03-05 – 2017-03-07 (×14): via INTRAVENOUS_CENTRAL
  Filled 2017-03-05 (×17): qty 5000

## 2017-03-05 MED ORDER — IPRATROPIUM-ALBUTEROL 0.5-2.5 (3) MG/3ML IN SOLN
3.0000 mL | Freq: Four times a day (QID) | RESPIRATORY_TRACT | Status: DC
  Administered 2017-03-05 – 2017-03-07 (×7): 3 mL via RESPIRATORY_TRACT
  Filled 2017-03-05 (×7): qty 3

## 2017-03-05 MED ORDER — DARBEPOETIN ALFA 150 MCG/0.3ML IJ SOSY
150.0000 ug | PREFILLED_SYRINGE | INTRAMUSCULAR | Status: DC
  Administered 2017-03-05: 150 ug via SUBCUTANEOUS
  Filled 2017-03-05: qty 0.3

## 2017-03-05 MED ORDER — ALPRAZOLAM 0.25 MG PO TABS
0.2500 mg | ORAL_TABLET | Freq: Two times a day (BID) | ORAL | Status: DC | PRN
  Administered 2017-03-05 – 2017-03-06 (×2): 0.25 mg via ORAL
  Filled 2017-03-05 (×2): qty 1

## 2017-03-05 MED ORDER — CALCIUM ACETATE (PHOS BINDER) 667 MG PO CAPS
2001.0000 mg | ORAL_CAPSULE | Freq: Three times a day (TID) | ORAL | Status: DC
  Administered 2017-03-06 (×3): 2001 mg via ORAL
  Filled 2017-03-05 (×6): qty 3

## 2017-03-05 MED ORDER — CALCIUM ACETATE (PHOS BINDER) 667 MG PO CAPS
667.0000 mg | ORAL_CAPSULE | ORAL | Status: DC
  Administered 2017-03-05 – 2017-03-06 (×4): 667 mg via ORAL
  Filled 2017-03-05 (×6): qty 1

## 2017-03-05 NOTE — Consult Note (Signed)
PULMONARY / CRITICAL CARE MEDICINE   Name: WLLIAM [name] MRN: [id number] DOB: 07/15/1931    ADMISSION DATE:  02/27/2017 CONSULTATION DATE:  03/05/17  REFERRING MD:  Aundra Dubin  CHIEF COMPLAINT:  AKI  HISTORY OF PRESENT ILLNESS:  [name] [name] is a 83 y.o. male with PMH as outlined below. He was admitted 02/27/17 with encephalopathy, sepsis due to PNA, AoC combined heart failure with acute exacerbation.  He was admitted to SDU and was started on BiPAP as well as broad spectrum abx.  Blood cultures then returned positive for Streptococcus; therefore, abx were narrowed to ceftriaxone.  He was started on diuresis with little effect; however, aggressive diuresis was restricted due to worsening renal failure.  Echo was performed 12/18 and demonstrated EF 25-30% (down from 45% prior on 11/18) with concern for right atrial pacemaker lead vegetation (TEE pending). CRRT was considered 12/28; however, decision was made to attempt more aggressive diuresis. Unfortunately, he did not respond; therefore, decision was made to start with CRRT 12/31.  He has DNR orders in place; however, he is willing to undergo a short term trial (~48 hours) of CRRT.  12/31, he was transferred to the ICU and PCCM was asked to assume care.   PAST MEDICAL HISTORY :  He  has a past medical history of AAA (abdominal aortic aneurysm) ([place]), Acute urinary retention (04/09/2013), CAD (coronary artery disease), Carotid artery occlusion, Chronic anemia, Chronic combined systolic and diastolic CHF (congestive heart failure) ([place]), Chronic renal insufficiency, CKD (chronic kidney disease), stage III ([place]), Closed displaced fracture of right femoral neck ([place]) (04/08/2013), Complete heart block (H[facility]), COPD (chronic obstructive pulmonary disease) ([place]) (08/15/2012), CVA (cerebral vascular accident) ([place]) (~ 2003), Dyslipidemia, Fall at home (04/09/2013), HTN (hypertension), CABG, Morbid obesity ([place]), MRSA carrier (02/28/2017), Myocardial  infarction ([place]), Neuropathy, Peripheral vascular disease ([place]), Presence of permanent cardiac pacemaker, S/P placement of cardiac pacemaker, Status post abdominal aortic aneurysm repair, Tobacco abuse, and Type II diabetes mellitus ([place]).  PAST SURGICAL HISTORY: He  has a past surgical history that includes Aorta - bilateral femoral artery bypass graft (N/A, 08/09/2012); Hip Arthroplasty (Right, 04/09/2013); abdominal aortagram (N/A, 08/07/2012); PACEMAKER IMPLANT (N/A, 09/11/2016); Insert / replace / remove pacemaker; Joint replacement; Tonsillectomy (1940); Lumbar disc surgery (1977); and Coronary artery bypass graft (2005).  No Known Allergies  No current facility-administered medications on file prior to encounter.    Current Outpatient Medications on File Prior to Encounter  Medication Sig  . albuterol (PROVENTIL) (2.5 MG/3ML) 0.083% nebulizer solution Take 3 mLs (2.5 mg total) by nebulization every 4 (four) hours as needed for wheezing or shortness of breath.  Jearl Klinefelter ELLIPTA 62.5-25 MCG/INH AEPB Inhale 1 puff into the lungs every morning.   Marland Kitchen aspirin 81 MG tablet Take 81 mg by mouth every morning.   Marland Kitchen atorvastatin (LIPITOR) 40 MG tablet Take 40 mg by mouth every morning.   . Chlorphen-Phenyleph-APAP (CORICIDIN D COLD/FLU/SINUS) 2-5-325 MG TABS Take 2 tablets by mouth daily.  . cholecalciferol (VITAMIN D) 1000 UNITS tablet Take 1,000 Units by mouth every morning.   . escitalopram (LEXAPRO) 5 MG tablet Take 5 mg by mouth every evening.   . furosemide (LASIX) 40 MG tablet Take 1 tablet (40 mg total) by mouth daily. (Patient taking differently: Take 20 mg by mouth daily. )  . gabapentin (NEURONTIN) 300 MG capsule Take 300 mg by mouth every morning.   . hydrALAZINE (APRESOLINE) 50 MG tablet Take 1 tablet (50 mg total) by mouth 3 (three) times daily.  Marland Kitchen  isosorbide mononitrate (IMDUR) 30 MG 24 hr tablet Take 1 tablet (30 mg total) by mouth daily.  . Melatonin 5 MG TABS Take 1 tablet by mouth at  bedtime.  . Mometasone Furoate (ASMANEX HFA) 100 MCG/ACT AERO Inhale 2 puffs into the lungs daily. (Patient taking differently: Inhale 2 puffs into the lungs at bedtime. )  . montelukast (SINGULAIR) 10 MG tablet Take 10 mg by mouth every evening.   Marland Kitchen oxybutynin (DITROPAN-XL) 5 MG 24 hr tablet Take 5 mg by mouth at bedtime.   . potassium chloride (K-DUR) 10 MEQ tablet Take 1 tablet (10 mEq total) by mouth daily.  Marland Kitchen PROAIR HFA 108 (90 Base) MCG/ACT inhaler Inhale 2 puffs into the lungs every 4 (four) hours as needed for wheezing or shortness of breath.  . TRADJENTA 5 MG TABS tablet Take 5 mg by mouth every morning.  . traMADol (ULTRAM) 50 MG tablet Take 1 tablet by mouth every 6 (six) hours as needed for moderate pain.   . vitamin B-12 (CYANOCOBALAMIN) 100 MCG tablet Take 100 mcg by mouth every morning.   . zolpidem (AMBIEN) 10 MG tablet Take 5 mg by mouth at bedtime.   . Mometasone Furoate (ASMANEX HFA) 100 MCG/ACT AERO Inhale 2 puffs into the lungs daily. (Patient not taking: Reported on 02/27/2017)    FAMILY HISTORY:  His indicated that his mother is deceased. He indicated that his father is deceased. He indicated that his maternal grandmother is deceased. He indicated that his maternal grandfather is deceased. He indicated that his paternal grandmother is deceased. He indicated that his paternal grandfather is deceased.   SOCIAL HISTORY: He  reports that he quit smoking about 4 years ago. His smoking use included cigarettes. He has a 50.00 pack-year smoking history. he has never used smokeless tobacco. He reports that he drinks alcohol. He reports that he does not use drugs.  REVIEW OF SYSTEMS:   All negative; except for those that are bolded, which indicate positives.  Constitutional: weight loss, weight gain, night sweats, fevers, chills, fatigue, weakness.  HEENT: headaches, sore throat, sneezing, nasal congestion, post nasal drip, difficulty swallowing, tooth/dental problems, visual  complaints, visual changes, ear aches. Neuro: difficulty with speech, weakness, numbness, ataxia. CV:  chest pain, orthopnea, PND, swelling in lower extremities, dizziness, palpitations, syncope.  Resp: cough, hemoptysis, dyspnea, wheezing. GI: heartburn, indigestion, abdominal pain, nausea, vomiting, diarrhea, constipation, change in bowel habits, loss of appetite, hematemesis, melena, hematochezia.  GU: dysuria, change in color of urine, urgency or frequency, flank pain, hematuria. MSK: joint pain or swelling, decreased range of motion. Psych: change in mood or affect, depression, anxiety, suicidal ideations, homicidal ideations. Skin: rash, itching, bruising.   SUBJECTIVE:  Feels weak and tired.  VITAL SIGNS: BP 135/81 (BP Location: Left Arm)   Pulse 87   Temp 97.6 F (36.4 C) (Oral)   Resp 17   Ht 5\' 10"  (1.778 m)   Wt 95.6 kg (210 lb 12.2 oz)   SpO2 94%   BMI 30.24 kg/m   HEMODYNAMICS: CVP:  [12 mmHg-19 mmHg] 13 mmHg  VENTILATOR SETTINGS:    INTAKE / OUTPUT: I/O last 3 completed shifts: In: 28 [I.V.:17; IV Piggyback:66] Out: 850 [Urine:850]   PHYSICAL EXAMINATION: General: Adult male, in NAD. Neuro: A&O x 3, non-focal.  HEENT: [place]/AT. PERRL, sclerae anicteric. Cardiovascular: RRR, no M/R/G.  Lungs: Respirations even and unlabored.  CTA bilaterally, No W/R/R. Abdomen: BS x 4, soft, NT/ND.  Musculoskeletal: No gross deformities, 1+ edema.  Skin: Intact, warm,  no rashes.  LABS:  BMET Recent Labs  Lab 03/03/17 0320 03/04/17 0545 03/05/17 0410  NA 134* 137 137  K 4.8 4.5 4.3  CL 100* 104 100*  CO2 19* 19* 22  BUN 95* 115* 136*  CREATININE 3.92* 4.27* 4.79*  GLUCOSE 177* 171* 203*    Electrolytes Recent Labs  Lab 03/03/17 0320 03/04/17 0545 03/05/17 0410  CALCIUM 8.7* 8.4* 8.2*  MG 2.8*  --   --     CBC Recent Labs  Lab 03/02/17 0758 03/03/17 0320 03/05/17 0410  WBC 15.7* 12.8* 11.3*  HGB 9.9* 9.4* 8.8*  HCT 30.9* 30.1* 28.0*  PLT 284  281 313    Coag's No results for input(s): APTT, INR in the last 168 hours.  Sepsis Markers Recent Labs  Lab 02/27/17 1647 02/27/17 2012 02/28/17 0142  LATICACIDVEN 2.42* 3.4* 1.8    ABG Recent Labs  Lab 02/27/17 1526  PHART 7.347*  PCO2ART 37.9  PO2ART 89.0    Liver Enzymes Recent Labs  Lab 02/27/17 1338  AST 27  ALT 12*  ALKPHOS 77  BILITOT 1.3*  ALBUMIN 3.3*    Cardiac Enzymes Recent Labs  Lab 02/27/17 1338 02/27/17 2012  TROPONINI 0.14* 0.13*    Glucose Recent Labs  Lab 03/03/17 1748 03/03/17 2121 03/04/17 0846 03/04/17 1242 03/04/17 1809 03/04/17 2101  GLUCAP 152* 135* 197* 179* 201* 203*    Imaging No results found.   STUDIES:  Renal US 12/28 > no hydro, chronic renal disease. CXR 12/29 > interval placement of right IJ CVL, b/l effusions, cardiomegaly Echo 12/27 > EF 25-30%, possible vegetation on RA lead of PM/ICD.  CULTURES: Blood 12/25 > strep pneumonia  ANTIBIOTICS: Vanc 12/25 > 12/26 Zosyn 12/25 > 12/25 Ceftriaxone 12/26 >   SIGNIFICANT EVENTS: 12/25 > admit 12/26 > cardiology consult 12/28 > nephrology consult 12/30 > palliative care consult, advanced heart failure team consult 12/31 > PCCM consult  LINES/TUBES: Right IJ CVL 12/29 >   Left IJ HD cath 12/31 >   DISCUSSION: 83 y.o. male with AoC combined heart failure, strep pneumonia and AKI.  Failed diuresis therefore being started on CRRT.  ASSESSMENT / PLAN:  PULMONARY A: At risk for intubation. Hx COPD (recently seen by Dr. Chase Caller as outpatient 02/21/17), tobacco dependence. P:   Monitor closely. BiPAP PRN for now. Continue solumedrol (dose decreased given minimal bronchospasm), BD's. CXR intermittently. Tobacco cessation counseling.  CARDIOVASCULAR A:  AoC combined heart failure - now with acute exacerbation. Hx CHB s/p PPM, MI, CAD s/p CABG 2005, HTN, HLD, AAA s/p repair 2014. P:  Advanced heart failure team following / managing. Continue  milrinone, hyralazine, imdur, coreg, ASA, per CHF team. Per CHF, if he recovers, would need to consider upgrade to BiV pacer. Will need TEE.  RENAL A:   AoCKD - worsening despite trial of lasix. ? Cardiorenal. P:   Trial of short term (~48 hrs) CRRT. Nephrology following. BMP in AM.  GASTROINTESTINAL A:   Nutrition. P:   Heart healthy diet.  HEMATOLOGIC A:   Anemia - chronic. VTE Prophylaxis. P:  Transfuse for Hgb < 7. SCD's / heparin. CBC in AM.  INFECTIOUS A:   Strep pneumonia. ? RA vegetation - some concern on TTE. P:   Abx as above (ceftriaxone).  Needs repeat blood cultures. Needs TEE at some point.  ENDOCRINE A:   Hx DM II.   P:   SSI.  NEUROLOGIC A:   Hx CVA. P:   No interventions required. Limit  sedating meds as able.  Family updated: Family updated at bedside.  Interdisciplinary Family Meeting v Palliative Care Meeting:  Due by: 03/11/17.  CC time: 35 min.   Montey Hora, [place] [facility] [place] Pager: [phone]  or [phone] 03/05/2017, 8:02 AM

## 2017-03-05 NOTE — Progress Notes (Signed)
Transported pt from [place] to 2M06 on BIPAP.

## 2017-03-05 NOTE — Progress Notes (Signed)
Patient experienced significant coughing with feeding assist. Coughed up large chunks of food that was not fully chewed. Nursing judgement used and made NPO and will make care team aware.

## 2017-03-05 NOTE — Procedures (Signed)
Central Venous Catheter Insertion Procedure Note [name] [name] [id number] [birth year]  Procedure: Insertion of Central Venous Catheter Indications: Assessment of intravascular volume, Drug and/or fluid administration and Frequent blood sampling  Procedure Details Consent: Risks of procedure as well as the alternatives and risks of each were explained to the (patient/caregiver).  Consent for procedure obtained. Time Out: Verified patient identification, verified procedure, site/side was marked, verified correct patient position, special equipment/implants available, medications/allergies/relevent history reviewed, required imaging and test results available.  Performed  Maximum sterile technique was used including antiseptics, cap, gloves, gown, hand hygiene, mask and sheet. Skin prep: Chlorhexidine; local anesthetic administered A antimicrobial bonded/coated triple lumen catheter was placed in the left internal jugular vein using the Seldinger technique.  Evaluation Blood flow good Complications: No apparent complications Patient did tolerate procedure well. Chest X-ray ordered to verify placement.  CXR: pending.  Procedure performed under direct ultrasound guidance for real time vessel cannulation.      Montey Hora, [place] [facility] [place] Pager: [phone]  or [phone] 03/05/2017, 9:49 AM

## 2017-03-05 NOTE — Progress Notes (Signed)
Interval events noted. Patient w/ poor UOP; transferred to ICU/PCCM for trial of CRRT. Appreciate care of all involved.  TRH will s/o while in ICU. Please call if we can be of assistance.  Murray Hodgkins, MD [facility] [phone]

## 2017-03-05 NOTE — Progress Notes (Signed)
Transferred to micu by bed,report given to RN.belongings with family.

## 2017-03-05 NOTE — Progress Notes (Signed)
Patient ID: [name] [name], male   DOB: 10/21/1931, 83 y.o.   MRN: [id number]     Advanced Heart Failure Rounding Note   Subjective:    Minimal response yesterday to high dose IV Lasix.  CVP remains elevated.  Co-ox this morning not accurate. BUN/creatinine continue to rise.   He is weak with poor appetite, breathing heavily but denies dyspnea at rest.     Objective:   Weight Range: 210 lb 12.2 oz (95.6 kg) Body mass index is 30.24 kg/m.   Vital Signs:   Temp:  [98 F (36.7 C)-98.2 F (36.8 C)] 98 F (36.7 C) (12/31 0324) Pulse Rate:  [72-104] 79 (12/31 0324) Resp:  [13-21] 14 (12/31 0324) BP: (106-151)/(57-88) 133/69 (12/31 0324) SpO2:  [94 %-99 %] 94 % (12/31 0324) Weight:  [210 lb 12.2 oz (95.6 kg)] 210 lb 12.2 oz (95.6 kg) (12/31 0324) Last BM Date: 03/04/17  Weight change: Filed Weights   03/03/17 0310 03/04/17 0308 03/05/17 0324  Weight: 209 lb 10.5 oz (95.1 kg) 208 lb 5.4 oz (94.5 kg) 210 lb 12.2 oz (95.6 kg)    Intake/Output:   Intake/Output Summary (Last 24 hours) at 03/05/2017 0732 Last data filed at 03/04/2017 1920 Gross per 24 hour  Intake 66 ml  Output 500 ml  Net -434 ml      Physical Exam    General:  Well appearing. No resp difficulty HEENT: Normal Neck: Supple. JVP . Carotids 2+ bilat; no bruits. No lymphadenopathy or thyromegaly appreciated. Cor: PMI nondisplaced. Regular rate & rhythm. No rubs, gallops or murmurs. Lungs: Clear Abdomen: Soft, nontender, nondistended. No hepatosplenomegaly. No bruits or masses. Good bowel sounds. Extremities: No cyanosis, clubbing, rash, edema Neuro: Alert & orientedx3, cranial nerves grossly intact. moves all 4 extremities w/o difficulty. Affect pleasant   Telemetry   NSR with RV pacing, personally reviewed  Labs    CBC Recent Labs    03/03/17 0320 03/05/17 0410  WBC 12.8* 11.3*  NEUTROABS  --  10.7*  HGB 9.4* 8.8*  HCT 30.1* 28.0*  MCV 92.6 92.7  PLT 281 557   Basic Metabolic  Panel Recent Labs    03/03/17 0320 03/04/17 0545 03/05/17 0410  NA 134* 137 137  K 4.8 4.5 4.3  CL 100* 104 100*  CO2 19* 19* 22  GLUCOSE 177* 171* 203*  BUN 95* 115* 136*  CREATININE 3.92* 4.27* 4.79*  CALCIUM 8.7* 8.4* 8.2*  MG 2.8*  --   --    Liver Function Tests No results for input(s): AST, ALT, ALKPHOS, BILITOT, PROT, ALBUMIN in the last 72 hours. No results for input(s): LIPASE, AMYLASE in the last 72 hours. Cardiac Enzymes No results for input(s): CKTOTAL, CKMB, CKMBINDEX, TROPONINI in the last 72 hours.  BNP: BNP (last 3 results) Recent Labs    01/22/17 2031 02/27/17 1338 03/01/17 1007  BNP 811.1* 1,214.4* 1,858.9*    ProBNP (last 3 results) Recent Labs    02/06/17 1302  PROBNP 2,488*     D-Dimer No results for input(s): DDIMER in the last 72 hours. Hemoglobin A1C No results for input(s): HGBA1C in the last 72 hours. Fasting Lipid Panel No results for input(s): CHOL, HDL, LDLCALC, TRIG, CHOLHDL, LDLDIRECT in the last 72 hours. Thyroid Function Tests No results for input(s): TSH, T4TOTAL, T3FREE, THYROIDAB in the last 72 hours.  Invalid input(s): FREET3  Other results:   Imaging     No results found.   Medications:     Scheduled Medications: . aspirin  EC  81 mg Oral Daily  . atorvastatin  40 mg Oral Daily  . carvedilol  3.125 mg Oral BID WC  . escitalopram  5 mg Oral QPM  . gabapentin  300 mg Oral Daily  . heparin  5,000 Units Subcutaneous Q8H  . hydrALAZINE  25 mg Oral Q8H  . insulin aspart  0-15 Units Subcutaneous TID WC  . insulin aspart  0-5 Units Subcutaneous QHS  . ipratropium-albuterol  3 mL Nebulization BID  . isosorbide mononitrate  30 mg Oral Daily  . mouth rinse  15 mL Mouth Rinse BID  . Melatonin  6 mg Oral QHS  . methylPREDNISolone (SOLU-MEDROL) injection  60 mg Intravenous Q8H  . mupirocin ointment  1 application Nasal BID  . polyethylene glycol  17 g Oral BID  . senna  1 tablet Oral QHS     Infusions: .  cefTRIAXone (ROCEPHIN)  IV Stopped (03/04/17 5366)  . furosemide 160 mg (03/04/17 1705)     PRN Medications:  ipratropium-albuterol, ondansetron (ZOFRAN) IV, promethazine, traMADol, zolpidem    Patient Profile   83 yo with CKD stage IV, PAD, CHF, CAD s/p CABG, CHB s/p MDT PPM, COPD who was admitted with Strep pneumo PNA, COPD exacerbation, and acute/chronic systolic CHF + AKI on CKD.  Assessment/Plan   1. Acute on chronic systolic CHF: Echo this admission with EF 25-30%, down from about 45% in 11/18.  He has regional wall motion abnormalities.  On exam, he is volume overloaded.  CVP remains elevated.  He has diuresed poorly in setting of AKI on CKD stage IV, got Lasix 160 mg IV bid yesterday with minimal response, weight up 2 lbs.  Not on inotrope as co-ox has been good (but not sent correctly this morning).  I do not think AKI is driven primarily by CHF/low output in this situation.  BP is normal to mildly elevated.  - Will re-send co-ox, but so far there has not appeared to be a role for inotrope.  - He has failed high dose Lasix, will need to start CVVH this morning.   - Can continue hydralazine/Imdur at current dosing for afterload reduction.  Continue Coreg at current dosing.  - If he recovers from the current episode, will need to consider upgrade for BiV pacing.  2. AKI on CKD stage IV: BUN/creatinine steadily rising with poor diuresis.  I do not think that this is primarily cardiorenal though CHF certainly plays a role.  Co-ox has been good (though needs to be re-sent this morning) and BP is currently stable to mildly elevated.  It is possible that he was transiently hypotensive in setting of S pneumoniae sepsis with ATN superimposed on his baseline CKD stage IV. Some signs of uremia, very weak and probably at least mildly confused. Intractable volume overload.  - Trial of CVVH => plan for 48 hours and reassess.  Patient would not want long-term HD.  3. ID: Strep pneumoniae PNA,  treating with ceftriaxone.  ?RA lead vegetation.  - Repeat blood cultures sent 12/30.  - Will need TEE to look at [facility] wires, would wait until we have volume better controlled and determine trajectory of treatment (ongoing aggressive versus palliative).  If he has lead vegetation and we are proceeding with aggressive care, may need PPM out which would be a problem if he is PPM dependent (will need to discuss with EP).  4. COPD: Treating for AECOPD per IM.  He is on Solumedrol IV.  No wheezing on exam.  5. CAD: s/p CABG 2005.  No recent cath.  No chest pain but possible progressive coronary disease given fall in EF with WMAs.   - No current ACS so no cath at this time given AKI.  - Continue ASA and statin.  6. PAD: s/p aortobifemoral bypass.  7. He is current DNR.  He has met with palliative care.  Willing to undergo 48 hour trial of CVVH if needed but does not want long-term HD.   Length of Stay: 6  Loralie Champagne, MD  03/05/2017, 7:32 AM  Advanced Heart Failure Team Pager [phone] (M-F; 7a - 4p)  Please contact [facility] for night-coverage after hours (4p -7a ) and weekends on [email or web address]

## 2017-03-05 NOTE — Progress Notes (Signed)
[facility] Rounding Note  Background: 83 yo WM PMH COPD, combined systolic and diastolic CHF, CAD, PVD, s/p PPM, CKD4 followed by Dr. Marval Regal (baseline creatinine 2.44 02/14/17), obesity, and DM II.  Admitted 12/254 for RLL pneumonia, acute hypoxic RF, and S. pneumo bacteremia.  + acute on chronic CHF exacerbation and AKI on CKD. Seen in consultation for AKI on CKD IV w/volume overload and possible some uremic sx. Failed IV diuretic trial. CRRT initiated 03/05/17. Time limited trial.  Assessment/Recommendations  1. AKI superimposed on CKD IV: Baseline Cr 2.4. AKI nonoliguric but not adequate diuretic response, co-ox doesn't quite fit with pure cardiogenic shock; cardiorenal syndrome superimposed on septic injury plausible.  Failed trial of high dose lasix and azotemia continues to worsen. CRRT initiated this AM. For now this is time limited trial, hope would be for some renal recovery, and with low EF would need adequate BP in order to tolerated conventional HD (this piece is unclear). Pt has prev said would not want long term HD. 1. CRRT 2. All 4K fluids 3. Net neg 50-100 to start 4. No heparin 5. Follow K/phos 2. Acute on chronic systolic/diastolic CHF -  TTE with EF down to 20-25% from 45% 01/2017. Vol overload, elevated CVP, no lasix response, now CRRT.   3. ? RA PPM lead vegetation. Will need TEE to clarify (once vol under better control). BC's pending from 12/30 4. Strep pneumo pneumonia and acute hypoxic RF.  On ceftriaxone.  Will need a TEE for possible lead vegetation when more stable 5. Anemia - Hb down 8.8. Start Aranesp 200/week. Check Fe studies (but no IV Fe with sepsis/bacteremia until off all ATB's) 6. COPD exacerbation - solumedrol 7. Dispo: CRRT. DNR. Has met with Palliative Care. Unclear which way this will go just yet     Subjective/Interval History:  Failed lasix trial so moved to ICU, line placed CRRT just started Pt very weak, + cough Wife in room - questions  answered  Objective Vital signs in last 24 hours: Vitals:   03/05/17 1000 03/05/17 1100 03/05/17 1134 03/05/17 1200  BP: 138/73 120/81  113/65  Pulse: 65 84  66  Resp: 15 13  13   Temp:   98.4 F (36.9 C)   TempSrc:   Oral   SpO2: 94% 96%  94%  Weight:      Height:       Weight change: 1.1 kg (2 lb 6.8 oz)  Intake/Output Summary (Last 24 hours) at 03/05/2017 1222 Last data filed at 03/05/2017 1200 Gross per 24 hour  Intake 560 ml  Output 571 ml  Net -11 ml   Physical Exam:  Blood pressure 113/65, pulse 66, temperature 98.4 F (36.9 C), temperature source Oral, resp. rate 13, height 5' 10"  (1.778 m), weight 95.6 kg (210 lb 12.2 oz), SpO2 94 %.   Ill appearing older WM Nasal cannula O2 CVP 11  Weight 95.6 kg (91.6 on admission) L IJ temp HD cath (12/31) in use for CRRT Sclerae not icteric + JVD to jaw angle Diffuse symphonic exp wheezes, crackles, some ^ WOB Abd soft and no focal tenderness Old surgical scars well healed 1+ edema LE's to thighs Dark yellow urine in urinometer  Recent Labs  Lab 02/27/17 1338 02/28/17 0142 03/01/17 0204 03/02/17 0324 03/03/17 0320 03/04/17 0545 03/05/17 0410  NA 134* 131* 133* 134* 134* 137 137  K 4.4 4.5 4.8 4.3 4.8 4.5 4.3  CL 102 101 99* 101 100* 104 100*  CO2 21* 20* 20*  17* 19* 19* 22  GLUCOSE 108* 244* 147* 162* 177* 171* 203*  BUN 39* 42* 61* 78* 95* 115* 136*  CREATININE 2.99* 2.94* 3.45* 3.65* 3.92* 4.27* 4.79*  CALCIUM 8.9 8.4* 8.7* 8.6* 8.7* 8.4* 8.2*    Recent Labs  Lab 02/27/17 1338  AST 27  ALT 12*  ALKPHOS 77  BILITOT 1.3*  PROT 7.8  ALBUMIN 3.3*    Recent Labs  Lab 03/01/17 0204 03/02/17 0758 03/03/17 0320 03/05/17 0410  WBC 21.0* 15.7* 12.8* 11.3*  NEUTROABS  --   --   --  10.7*  HGB 9.6* 9.9* 9.4* 8.8*  HCT 31.6* 30.9* 30.1* 28.0*  MCV 94.0 91.7 92.6 92.7  PLT 234 284 281 313    Recent Labs  Lab 02/27/17 1338 02/27/17 2012  TROPONINI 0.14* 0.13*    Recent Labs  Lab  03/04/17 1242 03/04/17 1809 03/04/17 2101 03/05/17 0754 03/05/17 1132  GLUCAP 179* 201* 203* 190* 155*   Studies/Results: Dg Chest Port 1 View  Result Date: 03/05/2017 CLINICAL DATA:  Status post left internal jugular catheter placement. EXAM: PORTABLE CHEST 1 VIEW COMPARISON:  Portable chest x-ray of March 03, 2017 FINDINGS: A dual-lumen left IJ catheter is been placed. The tip projects at the junction of the right and left brachiocephalic vein. The right IJ catheter is stable with its tip projecting over the midportion of the SVC. There is no pneumothorax or significant pleural effusion on the left. There is a small pleural effusion on the right. The cardiac silhouette remains enlarged. The pulmonary vascularity is slightly less engorged today. There is calcification in the wall of the aortic arch. The sternal wires are intact. The ICD is in stable position. IMPRESSION: No postprocedure complication following left IJV catheter placement. Slight interval improvement in CHF. Persistent right pleural effusion layering posteriorly. Thoracic aortic atherosclerosis. Electronically Signed   By: David  Martinique M.D.   On: 03/05/2017 10:16   Dg Chest Port 1 View  Result Date: 03/03/2017 CLINICAL DATA:  83 year old male with central line placement for EXAM: PORTABLE CHEST 1 VIEW COMPARISON:  Chest radiograph dated 03/03/2017 FINDINGS: There has been interval placement of a right IJ central line with tip over central SVC. No pneumothorax. Bilateral pleural effusions and associated atelectatic changes versus infiltrate noted. There is cardiomegaly with mild interstitial edema. Median sternotomy wires, CABG vascular clips, and left pectoral pacemaker device. No acute osseous pathology. IMPRESSION: 1. Interval placement of a right IJ central line the tip over central SVC. No pneumothorax. 2. Bilateral pleural effusions and a associated atelectasis versus infiltrate. The pleural effusions appear slightly larger  compared to the earlier radiograph. 3. Cardiomegaly with mild pulmonary edema. Electronically Signed   By: Anner Crete M.D.   On: 03/03/2017 19:13   Medications: . cefTRIAXone (ROCEPHIN)  IV Stopped (03/05/17 1056)  . dialysis replacement fluid (prismasate) 400 mL/hr at 03/05/17 1035  . dialysis replacement fluid (prismasate) 200 mL/hr at 03/05/17 1035  . dialysate (PRISMASATE) 1,500 mL/hr at 03/05/17 1035  . sodium chloride     . aspirin EC  81 mg Oral Daily  . atorvastatin  40 mg Oral Daily  . carvedilol  3.125 mg Oral BID WC  . escitalopram  5 mg Oral QPM  . gabapentin  300 mg Oral Daily  . heparin  5,000 Units Subcutaneous Q8H  . hydrALAZINE  25 mg Oral Q8H  . insulin aspart  0-15 Units Subcutaneous TID WC  . insulin aspart  0-5 Units Subcutaneous QHS  . ipratropium-albuterol  3 mL Nebulization Q6H  . isosorbide mononitrate  30 mg Oral Daily  . mouth rinse  15 mL Mouth Rinse BID  . Melatonin  6 mg Oral QHS  . methylPREDNISolone (SOLU-MEDROL) injection  40 mg Intravenous Q12H  . polyethylene glycol  17 g Oral BID  . senna  1 tablet Oral QHS    Jamal Maes, MD [facility] [phone] pager 03/05/2017, 12:22 PM

## 2017-03-06 ENCOUNTER — Inpatient Hospital Stay (HOSPITAL_COMMUNITY): Payer: Medicare Other

## 2017-03-06 DIAGNOSIS — R531 Weakness: Secondary | ICD-10-CM

## 2017-03-06 LAB — RENAL FUNCTION PANEL
ALBUMIN: 2.8 g/dL — AB (ref 3.5–5.0)
Albumin: 2.9 g/dL — ABNORMAL LOW (ref 3.5–5.0)
Anion gap: 11 (ref 5–15)
Anion gap: 10 (ref 5–15)
BUN: 78 mg/dL — AB (ref 6–20)
BUN: 58 mg/dL — AB (ref 6–20)
CALCIUM: 8.3 mg/dL — AB (ref 8.9–10.3)
CO2: 25 mmol/L (ref 22–32)
CO2: 25 mmol/L (ref 22–32)
CREATININE: 2.8 mg/dL — AB (ref 0.61–1.24)
CREATININE: 2.22 mg/dL — AB (ref 0.61–1.24)
Calcium: 8.7 mg/dL — ABNORMAL LOW (ref 8.9–10.3)
Chloride: 101 mmol/L (ref 101–111)
Chloride: 99 mmol/L — ABNORMAL LOW (ref 101–111)
GFR calc Af Amer: 23 mL/min — ABNORMAL LOW (ref >60)
GFR calc Af Amer: 30 mL/min — ABNORMAL LOW (ref >60)
GFR calc non Af Amer: 26 mL/min — ABNORMAL LOW (ref >60)
GFR, EST NON AFRICAN AMERICAN: 19 mL/min — AB (ref >60)
GLUCOSE: 146 mg/dL — AB (ref 65–99)
GLUCOSE: 131 mg/dL — AB (ref 65–99)
PHOSPHORUS: 5.3 mg/dL — AB (ref 2.5–4.6)
PHOSPHORUS: 4.2 mg/dL (ref 2.5–4.6)
POTASSIUM: 4.5 mmol/L (ref 3.5–5.1)
Potassium: 4.3 mmol/L (ref 3.5–5.1)
SODIUM: 137 mmol/L (ref 135–145)
SODIUM: 134 mmol/L — AB (ref 135–145)

## 2017-03-06 LAB — CBC WITH DIFFERENTIAL/PLATELET
BASOS PCT: 0 %
Basophils Absolute: 0 10*3/uL (ref 0.0–0.1)
EOS ABS: 0 10*3/uL (ref 0.0–0.7)
EOS PCT: 0 %
HCT: 31.7 % — ABNORMAL LOW (ref 39.0–52.0)
Hemoglobin: 9.9 g/dL — ABNORMAL LOW (ref 13.0–17.0)
Lymphocytes Relative: 4 %
Lymphs Abs: 0.5 10*3/uL — ABNORMAL LOW (ref 0.7–4.0)
MCH: 28.8 pg (ref 26.0–34.0)
MCHC: 31.2 g/dL (ref 30.0–36.0)
MCV: 92.2 fL (ref 78.0–100.0)
MONO ABS: 0.3 10*3/uL (ref 0.1–1.0)
Monocytes Relative: 2 %
Neutro Abs: 12.6 10*3/uL — ABNORMAL HIGH (ref 1.7–7.7)
Neutrophils Relative %: 94 %
PLATELETS: 303 10*3/uL (ref 150–400)
RBC: 3.44 MIL/uL — ABNORMAL LOW (ref 4.22–5.81)
RDW: 15.1 % (ref 11.5–15.5)
WBC: 13.4 10*3/uL — ABNORMAL HIGH (ref 4.0–10.5)

## 2017-03-06 LAB — POCT I-STAT 3, ART BLOOD GAS (G3+)
ACID-BASE DEFICIT: 1 mmol/L (ref 0.0–2.0)
Bicarbonate: 24.4 mmol/L (ref 20.0–28.0)
O2 SAT: 94 %
PH ART: 7.358 (ref 7.350–7.450)
PO2 ART: 75 mmHg — AB (ref 83.0–108.0)
Patient temperature: 98.3
TCO2: 26 mmol/L (ref 22–32)
pCO2 arterial: 43.2 mmHg (ref 32.0–48.0)

## 2017-03-06 LAB — GLUCOSE, CAPILLARY
GLUCOSE-CAPILLARY: 118 mg/dL — AB (ref 65–99)
GLUCOSE-CAPILLARY: 88 mg/dL (ref 65–99)
Glucose-Capillary: 141 mg/dL — ABNORMAL HIGH (ref 65–99)
Glucose-Capillary: 121 mg/dL — ABNORMAL HIGH (ref 65–99)

## 2017-03-06 LAB — MAGNESIUM: MAGNESIUM: 2.6 mg/dL — AB (ref 1.7–2.4)

## 2017-03-06 LAB — CARBOXYHEMOGLOBIN - COOX: Carboxyhemoglobin: 1.3 % (ref 0.5–1.5)

## 2017-03-06 LAB — COOXEMETRY PANEL
Carboxyhemoglobin: 1.3 % (ref 0.5–1.5)
METHEMOGLOBIN: 0.9 % (ref 0.0–1.5)
O2 Saturation: 51.8 %
TOTAL HEMOGLOBIN: 10.3 g/dL — AB (ref 12.0–16.0)

## 2017-03-06 MED ORDER — ALPRAZOLAM 0.5 MG PO TABS
0.5000 mg | ORAL_TABLET | Freq: Three times a day (TID) | ORAL | Status: DC | PRN
  Administered 2017-03-06 – 2017-03-08 (×3): 0.5 mg via ORAL
  Filled 2017-03-06 (×3): qty 1

## 2017-03-06 NOTE — Evaluation (Signed)
Clinical/Bedside Swallow Evaluation Patient Details  Name: DAIVEON MARKMAN MRN: [id number] Date of Birth: 11-25-1931  Today's Date: 03/06/2017 Time: SLP Start Time (ACUTE ONLY): 0911 SLP Stop Time (ACUTE ONLY): 0935 SLP Time Calculation (min) (ACUTE ONLY): 24 min  Past Medical History:  Past Medical History:  Diagnosis Date  . AAA (abdominal aortic aneurysm) [facility])    Surgical repair June, 2014  . Acute urinary retention 04/09/2013  . CAD (coronary artery disease)    a. s/p CABG in 2005 x 4. b. low risk nuc 2016.  . Carotid artery occlusion   . Chronic anemia   . Chronic combined systolic and diastolic CHF (congestive heart failure) ([place])   . Chronic renal insufficiency   . CKD (chronic kidney disease), stage III ([place])   . Closed displaced fracture of right femoral neck ([place]) 04/08/2013  . Complete heart block ([place])   . COPD (chronic obstructive pulmonary disease) ([place]) 08/15/2012  . CVA (cerebral vascular accident) ([place]) ~ 2003   residual R leg weakness (01/22/2017)  . Dyslipidemia   . Fall at home 04/09/2013   Fx Right Hip  . HTN (hypertension)   . Hx of CABG    2005  . Morbid obesity ([place])   . MRSA carrier 02/28/2017  . Myocardial infarction [facility])    "was told I had one before I had my heart bypass" (01/22/2017)  . Neuropathy    Right foot  . Peripheral vascular disease ([place])    a. s/p aorto-bifem bypass.  . Presence of permanent cardiac pacemaker   . S/P placement of cardiac pacemaker    a. [employer] 09/2016.  . Status post abdominal aortic aneurysm repair    June, 2014  . Tobacco abuse    quit 2014  . Type II diabetes mellitus ([place])    Past Surgical History:  Past Surgical History:  Procedure Laterality Date  . ABDOMINAL AORTAGRAM N/A 08/07/2012   Procedure: ABDOMINAL AORTAGRAM;  Surgeon: Rosetta Posner, MD;  Location: [facility] CATH LAB;  Service: Cardiovascular;  Laterality: N/A;  . AORTA - BILATERAL FEMORAL ARTERY BYPASS GRAFT N/A 08/09/2012   Procedure: AORTA BIFEMORAL  BYPASS GRAFT;  Surgeon: Rosetta Posner, MD;  Location: [facility];  Service: Vascular;  Laterality: N/A;  . CORONARY ARTERY BYPASS GRAFT  2005   CABG X"5"  . HIP ARTHROPLASTY Right 04/09/2013   Procedure: ARTHROPLASTY BIPOLAR HIP;  Surgeon: Gearlean Alf, MD;  Location: [facility] ORS;  Service: Orthopedics;  Laterality: Right;  . INSERT / REPLACE / REMOVE PACEMAKER    . JOINT REPLACEMENT    . LUMBAR DISC SURGERY  1977   Lumbar HNP surgery  . PACEMAKER IMPLANT N/A 09/11/2016   Procedure: Pacemaker Implant;  Surgeon: Deboraha Sprang, MD;  Location: [facility] CV LAB;  Service: Cardiovascular;  Laterality: N/A;  . TONSILLECTOMY  1940   HPI:  NORLAN RANN is a 84 y.o. male with medical history significant for CAD status post CABG (2004), also bradycardia status post peacemaker, chronic combined CHF, PAD, AAA status post repair 2014, prior CVA, HLD, HTN, tobacco abuse who presents on 02/27/2017 with increased confusion witnessed by wife was found to be septic secondary to likely pneumonia, gated by COPD exacerbation.   Assessment / Plan / Recommendation Clinical Impression  84 yr old deconditioned with weak volitional cough, admitted 12/25 not requiring intubation. Pt/wife report swallow difficulties for "awhile;  food comes back up", "rice/meat is hard for him". Pt exhibited delayed cough after liquid x 1. Observed oral  pocketing of pills in applesauce with RN. Suspect an acute on chronic likely primary esophageal dysphagia. Recommend crushed pills and full liqud diet. Pt will need objective assessment with MBS recommended due to ability to observe esophagus however pt on continuous dialysis (may be off tomorrow per RN). Educated pt to cup sips only, sit upright in bed, small sips and stop eating if increased coughing. ST will follow up for appropriate objective assessment.    SLP Visit Diagnosis: Dysphagia, unspecified (R13.10)    Aspiration Risk  Moderate aspiration risk    Diet Recommendation Thin  liquid(full liquids)   Liquid Administration via: Cup;No straw Medication Administration: Crushed with puree Supervision: Patient able to self feed;Full supervision/cueing for compensatory strategies Compensations: Slow rate;Small sips/bites Postural Changes: Remain upright for at least 30 minutes after po intake;Seated upright at 90 degrees    Other  Recommendations Recommended Consults: Consider GI evaluation Oral Care Recommendations: Oral care BID   Follow up Recommendations Other (comment)(TBD)      Frequency and Duration min 2x/week  2 weeks       Prognosis Prognosis for Safe Diet Advancement: Good      Swallow Study   General HPI: BRANDIN STETZER is a 84 y.o. male with medical history significant for CAD status post CABG (2004), also bradycardia status post peacemaker, chronic combined CHF, PAD, AAA status post repair 2014, prior CVA, HLD, HTN, tobacco abuse who presents on 02/27/2017 with increased confusion witnessed by wife was found to be septic secondary to likely pneumonia, gated by COPD exacerbation. Type of Study: Bedside Swallow Evaluation Previous Swallow Assessment: (none) Diet Prior to this Study: Thin liquids(clear liquids) Temperature Spikes Noted: No Respiratory Status: Nasal cannula History of Recent Intubation: No Behavior/Cognition: Alert;Cooperative;Pleasant mood Oral Cavity Assessment: Dry;Other (comment)(lingual candidias) Oral Care Completed by SLP: Yes Oral Cavity - Dentition: Dentures, top(no lower dentition) Vision: Functional for self-feeding Self-Feeding Abilities: Able to feed self Patient Positioning: Upright in bed Baseline Vocal Quality: Normal;Low vocal intensity Volitional Cough: Weak Volitional Swallow: Able to elicit    Oral/Motor/Sensory Function Overall Oral Motor/Sensory Function: Within functional limits   Ice Chips Ice chips: Not tested   Thin Liquid Thin Liquid: Impaired Presentation: Cup;Straw Pharyngeal  Phase  Impairments: Cough - Delayed    Nectar Thick Nectar Thick Liquid: Not tested   Honey Thick Honey Thick Liquid: Not tested   Puree Puree: Within functional limits   Solid   GO   Solid: Within functional limits        Houston Siren 03/06/2017,9:49 AM [name] Colvin Caroli.Ed [employer] [phone]

## 2017-03-06 NOTE — Consult Note (Signed)
PULMONARY / CRITICAL CARE MEDICINE   Name: [name] [name] MRN: [id number] DOB: 1931-07-22    ADMISSION DATE:  02/27/2017 CONSULTATION DATE:  03/05/17  REFERRING MD:  Aundra Dubin  CHIEF COMPLAINT:  AKI  HISTORY OF PRESENT ILLNESS:  [name] [name] is a 84 y.o. male with PMH as outlined below. He was admitted 02/27/17 with encephalopathy, sepsis due to PNA, A on C combined heart failure with acute exacerbation.  He was admitted to SDU and was started on BiPAP as well as broad spectrum abx.  Blood cultures then returned positive for Streptococcus; therefore, abx were narrowed to ceftriaxone.  He was started on diuresis with little effect; however, aggressive diuresis was restricted due to worsening renal failure.  Echo was performed 12/18 and demonstrated EF 25-30% (down from 45% prior on 11/18) with concern for right atrial pacemaker lead vegetation (TEE pending). CRRT was considered 12/28; however, decision was made to attempt more aggressive diuresis. Unfortunately, he did not respond; therefore, decision was made to start with CRRT 12/31.  He has DNR orders in place; however, he is willing to undergo a short term trial (~48 hours) of CRRT.  12/31, he was transferred to the ICU and PCCM was asked to assume care.   SUBJECTIVE: Awake and alert, states he feels his breathing is better. Anxious  VITAL SIGNS: BP 138/84   Pulse 70   Temp (!) 97.3 F (36.3 C) (Axillary)   Resp 15   Ht 5\' 10"  (1.778 m)   Wt 203 lb 11.3 oz (92.4 kg)   SpO2 97%   BMI 29.23 kg/m   HEMODYNAMICS: CVP:  [8 mmHg-12 mmHg] 10 mmHg  VENTILATOR SETTINGS:    INTAKE / OUTPUT: I/O last 3 completed shifts: In: 3716 [P.O.:791; I.V.:210; IV Piggyback:150] Out: 9678 [Urine:100; Other:3179]   PHYSICAL EXAMINATION: General: Adult male,supine in bed on 2 L [place] , on CVVHD Neuro: A&O x 3, MAE x 4, non-focal.  HEENT: /AT. PERRL, sclerae anicteric. Cardiovascular:S1, S2,  RRR, no M/R/G.  Lungs: Respirations even and  unlabored.Abdominal muscle use   Crackles per bases  Bilaterally, NO wheezing noted Abdomen: BS x 4, soft, NT/ND.  Musculoskeletal: No gross deformities, Trace  edema.  Skin: Intact, warm, no rashes., no lesions , thin frail skin  LABS:  BMET Recent Labs  Lab 03/05/17 0410 03/05/17 1640 03/06/17 0400  NA 137 138 137  K 4.3 4.4 4.3  CL 100* 101 101  CO2 22 23 25   BUN 136* 109* 78*  CREATININE 4.79* 3.82* 2.80*  GLUCOSE 203* 117* 146*    Electrolytes Recent Labs  Lab 03/03/17 0320  03/05/17 0410 03/05/17 1640 03/06/17 0400  CALCIUM 8.7*   < > 8.2* 8.2* 8.3*  MG 2.8*  --   --   --  2.6*  PHOS  --   --   --  7.5* 5.3*   < > = values in this interval not displayed.    CBC Recent Labs  Lab 03/03/17 0320 03/05/17 0410 03/06/17 0400  WBC 12.8* 11.3* 13.4*  HGB 9.4* 8.8* 9.9*  HCT 30.1* 28.0* 31.7*  PLT 281 313 303    Coag's No results for input(s): APTT, INR in the last 168 hours.  Sepsis Markers Recent Labs  Lab 02/27/17 1647 02/27/17 2012 02/28/17 0142  LATICACIDVEN 2.42* 3.4* 1.8    ABG Recent Labs  Lab 02/27/17 1526  PHART 7.347*  PCO2ART 37.9  PO2ART 89.0    Liver Enzymes Recent Labs  Lab 02/27/17 1338 03/05/17 1640  03/06/17 0400  AST 27  --   --   ALT 12*  --   --   ALKPHOS 77  --   --   BILITOT 1.3*  --   --   ALBUMIN 3.3* 2.8* 2.8*    Cardiac Enzymes Recent Labs  Lab 02/27/17 1338 02/27/17 2012  TROPONINI 0.14* 0.13*    Glucose Recent Labs  Lab 03/04/17 2101 03/05/17 0754 03/05/17 1132 03/05/17 1541 03/05/17 2213 03/06/17 0737  GLUCAP 203* 190* 155* 116* 144* 141*    Imaging Dg Chest Port 1 View  Result Date: 03/05/2017 CLINICAL DATA:  Status post left internal jugular catheter placement. EXAM: PORTABLE CHEST 1 VIEW COMPARISON:  Portable chest x-ray of March 03, 2017 FINDINGS: A dual-lumen left IJ catheter is been placed. The tip projects at the junction of the right and left brachiocephalic vein. The right IJ  catheter is stable with its tip projecting over the midportion of the SVC. There is no pneumothorax or significant pleural effusion on the left. There is a small pleural effusion on the right. The cardiac silhouette remains enlarged. The pulmonary vascularity is slightly less engorged today. There is calcification in the wall of the aortic arch. The sternal wires are intact. The ICD is in stable position. IMPRESSION: No postprocedure complication following left IJV catheter placement. Slight interval improvement in CHF. Persistent right pleural effusion layering posteriorly. Thoracic aortic atherosclerosis. Electronically Signed   By: David  Martinique M.D.   On: 03/05/2017 10:16     STUDIES:  Renal US 12/28 > no hydro, chronic renal disease. CXR 12/29 > interval placement of right IJ CVL, b/l effusions, cardiomegaly Echo 12/27 > EF 25-30%, possible vegetation on RA lead of PM/ICD.  CULTURES: Blood 12/25 > strep pneumonia  ANTIBIOTICS: Vanc 12/25 > 12/26 Zosyn 12/25 > 12/25 Ceftriaxone 12/26 >   SIGNIFICANT EVENTS: 12/25 > admit 12/26 > cardiology consult 12/28 > nephrology consult 12/30 > palliative care consult, advanced heart failure team consult 12/31 > PCCM consult 12/31> CVVHD x 48 hours  LINES/TUBES: Right IJ CVL 12/29 >   Left IJ HD cath 12/31 >   DISCUSSION: 84 y.o. male with AoC combined heart failure, strep pneumonia and AKI.  Failed diuresis therefore being started on CRRT x 48 hours.  ASSESSMENT / PLAN:  PULMONARY A: At risk for intubation. Hx COPD (recently seen by Dr. Chase Caller as outpatient 02/21/17), tobacco dependence. P:   Monitor closely. BiPAP PRN for now. Maintain Sats 88-92% Continue  solumedrol (dose decreased given minimal bronchospasm),  Scheduled BD's. CXR intermittently. Tobacco cessation counseling.  CARDIOVASCULAR A:  AoC combined heart failure - now with acute exacerbation. Hx CHB s/p PPM, MI, CAD s/p CABG 2005, HTN, HLD, AAA s/p repair  2014. Tolerating CVVHD with - 2L, exam improved,  CVP is 8 ( 1/1) P:  Advanced heart failure team following / managing. Continue milrinone, hyralazine, imdur, coreg, ASA, per CHF team. Tolerating CVVH, Will continue x 24 additional hours Per CHF, if he recovers, would need to consider upgrade to BiV pacer. Will need TEE to look at [facility]  wires   RENAL A:   AoCKD - worsening despite trial of lasix. ? Cardiorenal. CVVHD initiated 03/05/2017 plan  x 48 hours Creatinine responding P:   Trial of short term (~48 hrs) CRRT. Nephrology following. BMP daily Replete electrolytes as needed Avoid nephrotoxic medications   GASTROINTESTINAL A:   Nutrition. P:   Heart healthy diet. Monitor CBG's  HEMATOLOGIC A:   Anemia - chronic. VTE  Prophylaxis. P:  Transfuse for Hgb < 7. SCD's / heparin. CBC daily Monitor for obvious signs of bleeding.  INFECTIOUS A:   Strep pneumonia. ? RA vegetation - some concern on TTE. P:   Abx as above (ceftriaxone).  Repeat cultures as clinically indicated. Needs TEE at some point to evaluate PPM wires for vegatation  ENDOCRINE A:   Hx DM II.   P:   SSI.  NEUROLOGIC A:   Hx CVA. P:   No interventions required. Limit sedating meds as able. Neuro checks per unit routine  Discussion: Pt. Is improving on CVVHD. Wife is asking about longer course of treatment. Palliation is actively  involved with determining goals of care. If TEE reveals vegetation on pacer wires, and patient is pacer dependent decision will need to be made regarding how aggressive plan of care will be.  Family updated: Wife  updated at bedside 03/06/2016.  Interdisciplinary Family Meeting v Palliative Care Meeting:  Due by: 03/11/17.    Magdalen Spatz, AGACNP-BC [facility] Pager:  [phone] 03/06/2017, 9:34 AM

## 2017-03-06 NOTE — Progress Notes (Signed)
Placed pt on bipap for the night per order. RT will continue to monitor.

## 2017-03-06 NOTE — Progress Notes (Signed)
[facility] Rounding Note  Background: 84 yo WM PMH COPD, combined systolic and diastolic CHF, CAD, PVD, s/p PPM, CKD4 followed by Dr. Marval Regal (baseline creatinine 2.44 02/14/17), obesity, and DM II.  Admitted 12/254 for RLL pneumonia, acute hypoxic RF, and S. pneumo bacteremia. + acute on chronic CHF exacerbation and AKI (septic hemodynamic) on CKD. Seen in consultation for AKI on CKD IV w/volume overload and some uremic sx. Failed IV diuretic trial. CRRT initiated 03/05/17.   CRRT (03/05/17) 1. CRRT 2. All 4K fluids 3. Net neg 50-100  4. No heparin 5. Access L IJ temp cath (03/05/17 CCM) 6. Current effluent dose 25 ml/kg.hour  Assessment/Recommendations  1. AKI superimposed on CKD IV: Baseline Cr 2.4. AKI nonoliguric but not adequate diuretic response, co-ox not c/w pure cardiogenic shock; septic/hemodyanmic injury most likely proximate insult. Failed high dose lasix. CRRT initiated 03/05/17. Time limited trial, hope would be for some renal recovery, and BP would not preclude IHD longer term - but he has said in past that he did not want. Needs to be less uremic to make those kinds of decisions now. 1. Continue same CRRT as tolerating well, weight down 95.6->92.4, exam improved and mentally clearer 2. K and phos are fine  2. Acute on chronic systolic/diastolic CHF -  TTE with EF down to 20-25% from 45% 01/2017. Vol overload, elevated CVP, no lasix response. Pulling fluid nicely w/CRRT, exam improved  3. ? RA PPM lead vegetation. Will need TEE to clarify (once vol under better control). Repeat BC's pending from 12/30 4. Strep pneumo pneumonia and acute hypoxic RF.  On ceftriaxone. Will need a TEE for possible lead vegetation when more stable 5. Anemia - Hb down 8.8. Started Aranesp 200/week. Check Fe studies (but no IV Fe with sepsis/bacteremia until off all ATB's) 6. COPD exacerbation - solumedrol 7. Dispo: CRRT. DNR. Has met with Palliative Care. Unclear which way this will go just yet      Subjective/Interval History:  Pt brighter, less dyspneic Weight down 3 kg No pain, improved SOB  Objective Vital signs in last 24 hours: Vitals:   03/06/17 0602 03/06/17 0700 03/06/17 0740 03/06/17 0800  BP: 133/83 127/80  127/89  Pulse: 77 72  76  Resp:  16  13  Temp:   (!) 97.3 F (36.3 C)   TempSrc:   Axillary   SpO2:  93%  100%  Weight:      Height:       Weight change: -3.2 kg (-0.9 oz)  Intake/Output Summary (Last 24 hours) at 03/06/2017 0908 Last data filed at 03/06/2017 0800 Gross per 24 hour  Intake 1161 ml  Output 3530 ml  Net -2369 ml   Physical Exam:  Blood pressure 127/89, pulse 76, temperature (!) 97.3 F (36.3 C), temperature source Axillary, resp. rate 13, height 5' 10"  (1.778 m), weight 92.4 kg (203 lb 11.3 oz), SpO2 100 %.   Pale appearing older WM Nasal cannula O2 CVP 10 Weight 95.6->92.4 since CRRT L IJ temp HD cath (12/31) Sclerae not icteric + JVD to  Wheezing MUCH improved, crackles but less prominent Abd soft and no focal tenderness Old surgical scars well healed 1+ edema LE's to thighs Dark yellow urine in urinometer Much more alert and interactive today  Recent Labs  Lab 03/01/17 0204 03/02/17 0324 03/03/17 0320 03/04/17 0545 03/05/17 0410 03/05/17 1640 03/06/17 0400  NA 133* 134* 134* 137 137 138 137  K 4.8 4.3 4.8 4.5 4.3 4.4 4.3  CL 99* 101  100* 104 100* 101 101  CO2 20* 17* 19* 19* 22 23 25   GLUCOSE 147* 162* 177* 171* 203* 117* 146*  BUN 61* 78* 95* 115* 136* 109* 78*  CREATININE 3.45* 3.65* 3.92* 4.27* 4.79* 3.82* 2.80*  CALCIUM 8.7* 8.6* 8.7* 8.4* 8.2* 8.2* 8.3*  PHOS  --   --   --   --   --  7.5* 5.3*    Recent Labs  Lab 02/27/17 1338 03/05/17 1640 03/06/17 0400  AST 27  --   --   ALT 12*  --   --   ALKPHOS 77  --   --   BILITOT 1.3*  --   --   PROT 7.8  --   --   ALBUMIN 3.3* 2.8* 2.8*    Recent Labs  Lab 03/02/17 0758 03/03/17 0320 03/05/17 0410 03/06/17 0400  WBC 15.7* 12.8* 11.3* 13.4*   NEUTROABS  --   --  10.7* 12.6*  HGB 9.9* 9.4* 8.8* 9.9*  HCT 30.9* 30.1* 28.0* 31.7*  MCV 91.7 92.6 92.7 92.2  PLT 284 281 313 303    Recent Labs  Lab 02/27/17 1338 02/27/17 2012  TROPONINI 0.14* 0.13*    Recent Labs  Lab 03/05/17 0754 03/05/17 1132 03/05/17 1541 03/05/17 2213 03/06/17 0737  GLUCAP 190* 155* 116* 144* 141*   Iron/TIBC/Ferritin/ %Sat    Component Value Date/Time   IRON 39 (L) 03/05/2017 1337   TIBC 217 (L) 03/05/2017 1337   FERRITIN 344 (H) 03/05/2017 1337   IRONPCTSAT 18 03/05/2017 1337   Studies/Results: Dg Chest Port 1 View  Result Date: 03/05/2017 CLINICAL DATA:  Status post left internal jugular catheter placement. EXAM: PORTABLE CHEST 1 VIEW COMPARISON:  Portable chest x-ray of March 03, 2017 FINDINGS: A dual-lumen left IJ catheter is been placed. The tip projects at the junction of the right and left brachiocephalic vein. The right IJ catheter is stable with its tip projecting over the midportion of the SVC. There is no pneumothorax or significant pleural effusion on the left. There is a small pleural effusion on the right. The cardiac silhouette remains enlarged. The pulmonary vascularity is slightly less engorged today. There is calcification in the wall of the aortic arch. The sternal wires are intact. The ICD is in stable position. IMPRESSION: No postprocedure complication following left IJV catheter placement. Slight interval improvement in CHF. Persistent right pleural effusion layering posteriorly. Thoracic aortic atherosclerosis. Electronically Signed   By: David  Martinique M.D.   On: 03/05/2017 10:16   CXR from 03/06/17 pending  Medications: . cefTRIAXone (ROCEPHIN)  IV Stopped (03/06/17 0815)  . dialysis replacement fluid (prismasate) 400 mL/hr at 03/06/17 0005  . dialysis replacement fluid (prismasate) 200 mL/hr at 03/05/17 1035  . dialysate (PRISMASATE) 1,500 mL/hr at 03/06/17 0747  . sodium chloride     . aspirin EC  81 mg Oral Daily   . atorvastatin  40 mg Oral Daily  . calcium acetate  2,001 mg Oral TID WC  . calcium acetate  667 mg Oral With snacks  . carvedilol  3.125 mg Oral BID WC  . darbepoetin (ARANESP) injection - NON-DIALYSIS  150 mcg Subcutaneous Q Mon-1800  . escitalopram  5 mg Oral QPM  . gabapentin  300 mg Oral Daily  . heparin  5,000 Units Subcutaneous Q8H  . hydrALAZINE  25 mg Oral Q8H  . insulin aspart  0-15 Units Subcutaneous TID WC  . insulin aspart  0-5 Units Subcutaneous QHS  . ipratropium-albuterol  3 mL  Nebulization Q6H  . isosorbide mononitrate  30 mg Oral Daily  . mouth rinse  15 mL Mouth Rinse BID  . Melatonin  6 mg Oral QHS  . methylPREDNISolone (SOLU-MEDROL) injection  40 mg Intravenous Q12H  . polyethylene glycol  17 g Oral BID  . senna  1 tablet Oral QHS    Jamal Maes, MD [facility] [phone] pager 03/06/2017, 9:08 AM

## 2017-03-06 NOTE — Progress Notes (Addendum)
Patient ID: [name] [name], male   DOB: 03-20-1931, 84 y.o.   MRN: [id number]  This NP visited patient at the bedside as a follow up to  yesterday's [place], wife at bedside.     Patient is alert oriented appears weak, anxious  and generally uncomfortable.  Speech therapy is working with him on his swallow.          Recommended diet is full liquids, will place order        Increase Xanax to 0.5 mg po TID prn  And failed high-dose Lasix and CVVH was initiated on 03/05/2017.  Goal was a time limited trial in hopes that there would be some renal recovery.  Patient has stated in the past that he is not interested in long term hemodialysis.  Continued conversation with wife regarding diagnosis, prognosis, goals of care, limitation of medical interventions, mortality, failure to thrive and the "knowing" when it is time to shift to a more focused comfort approach.   I believe she understands the long-term poor prognosis and is slowly coming to terms with the reality of the situation.  Today continue with current medical interventions, treat the treatable, and hope for improvement.  Questions and concerns addressed   this NP will follow up in the morning for palliative needs and emotional support.  Time in 0910          Time out  0940   Total time spent on the unit was 30 minutes  Discussed with Dr. Carollee Leitz  Greater than 50% of the time was spent in counseling and coordination of care  Wadie Lessen NP  Palliative Medicine Team Team Phone # [phone] Pager [phone]

## 2017-03-06 NOTE — Progress Notes (Addendum)
Patient ID: [name] [name], male   DOB: [birth year], 84 y.o.   MRN: [id number]     Advanced Heart Failure Rounding Note   Subjective:    CVVH started 12/31.   Weight down this morning with net 2L fluid off.  He says that his breathing is improved.  Patient made NPO for now with coughing with eating last night.    CVP lower today at 8-9 cm, co-ox inaccurate this morning but was 87% yesterday.   Objective:   Weight Range: 203 lb 11.3 oz (92.4 kg) Body mass index is 29.23 kg/m.   Vital Signs:   Temp:  [97.3 F (36.3 C)-98.6 F (37 C)] 97.3 F (36.3 C) (01/01 0740) Pulse Rate:  [59-87] 76 (01/01 0800) Resp:  [12-19] 13 (01/01 0800) BP: (113-139)/(65-89) 127/89 (01/01 0800) SpO2:  [93 %-100 %] 100 % (01/01 0800) FiO2 (%):  [35 %] 35 % (12/31 0903) Weight:  [203 lb 11.3 oz (92.4 kg)] 203 lb 11.3 oz (92.4 kg) (01/01 0500) Last BM Date: 03/05/17  Weight change: Filed Weights   03/04/17 0308 03/05/17 0324 03/06/17 0500  Weight: 208 lb 5.4 oz (94.5 kg) 210 lb 12.2 oz (95.6 kg) 203 lb 11.3 oz (92.4 kg)    Intake/Output:   Intake/Output Summary (Last 24 hours) at 03/06/2017 0852 Last data filed at 03/06/2017 0800 Gross per 24 hour  Intake 1161 ml  Output 3530 ml  Net -2369 ml      Physical Exam    General:  NAD HEENT: Normal Neck: Supple. JVP 8-9 cm. Carotids 2+ bilat; no bruits. No lymphadenopathy or thyromegaly appreciated. Cor: PMI nondisplaced. Regular rate & rhythm. No rubs, gallops or murmurs. Lungs: Dependent crackles.  Abdomen: Soft, nontender, nondistended. No hepatosplenomegaly. No bruits or masses. Good bowel sounds. Extremities: No cyanosis, clubbing, rash.  1+ ankle edema.  Neuro: Some confusion overnight.  Alert this morning but not oriented.    Telemetry   NSR with RV pacing, personally reviewed  Labs    CBC Recent Labs    03/05/17 0410 03/06/17 0400  WBC 11.3* 13.4*  NEUTROABS 10.7* 12.6*  HGB 8.8* 9.9*  HCT 28.0* 31.7*  MCV 92.7 92.2  PLT 313  387   Basic Metabolic Panel Recent Labs    03/05/17 1640 03/06/17 0400  NA 138 137  K 4.4 4.3  CL 101 101  CO2 23 25  GLUCOSE 117* 146*  BUN 109* 78*  CREATININE 3.82* 2.80*  CALCIUM 8.2* 8.3*  MG  --  2.6*  PHOS 7.5* 5.3*   Liver Function Tests Recent Labs    03/05/17 1640 03/06/17 0400  ALBUMIN 2.8* 2.8*   No results for input(s): LIPASE, AMYLASE in the last 72 hours. Cardiac Enzymes No results for input(s): CKTOTAL, CKMB, CKMBINDEX, TROPONINI in the last 72 hours.  BNP: BNP (last 3 results) Recent Labs    01/22/17 2031 02/27/17 1338 03/01/17 1007  BNP 811.1* 1,214.4* 1,858.9*    ProBNP (last 3 results) Recent Labs    02/06/17 1302  PROBNP 2,488*     D-Dimer No results for input(s): DDIMER in the last 72 hours. Hemoglobin A1C No results for input(s): HGBA1C in the last 72 hours. Fasting Lipid Panel No results for input(s): CHOL, HDL, LDLCALC, TRIG, CHOLHDL, LDLDIRECT in the last 72 hours. Thyroid Function Tests No results for input(s): TSH, T4TOTAL, T3FREE, THYROIDAB in the last 72 hours.  Invalid input(s): FREET3  Other results:   Imaging    Dg Chest [facility] 1 [address]  Result Date: 03/05/2017 CLINICAL DATA:  Status post left internal jugular catheter placement. EXAM: PORTABLE CHEST 1 VIEW COMPARISON:  Portable chest x-ray of March 03, 2017 FINDINGS: A dual-lumen left IJ catheter is been placed. The tip projects at the junction of the right and left brachiocephalic vein. The right IJ catheter is stable with its tip projecting over the midportion of the SVC. There is no pneumothorax or significant pleural effusion on the left. There is a small pleural effusion on the right. The cardiac silhouette remains enlarged. The pulmonary vascularity is slightly less engorged today. There is calcification in the wall of the aortic arch. The sternal wires are intact. The ICD is in stable position. IMPRESSION: No postprocedure complication following left IJV  catheter placement. Slight interval improvement in CHF. Persistent right pleural effusion layering posteriorly. Thoracic aortic atherosclerosis. Electronically Signed   By: David  Martinique M.D.   On: 03/05/2017 10:16     Medications:     Scheduled Medications: . aspirin EC  81 mg Oral Daily  . atorvastatin  40 mg Oral Daily  . calcium acetate  2,001 mg Oral TID WC  . calcium acetate  667 mg Oral With snacks  . carvedilol  3.125 mg Oral BID WC  . darbepoetin (ARANESP) injection - NON-DIALYSIS  150 mcg Subcutaneous Q Mon-1800  . escitalopram  5 mg Oral QPM  . gabapentin  300 mg Oral Daily  . heparin  5,000 Units Subcutaneous Q8H  . hydrALAZINE  25 mg Oral Q8H  . insulin aspart  0-15 Units Subcutaneous TID WC  . insulin aspart  0-5 Units Subcutaneous QHS  . ipratropium-albuterol  3 mL Nebulization Q6H  . isosorbide mononitrate  30 mg Oral Daily  . mouth rinse  15 mL Mouth Rinse BID  . Melatonin  6 mg Oral QHS  . methylPREDNISolone (SOLU-MEDROL) injection  40 mg Intravenous Q12H  . polyethylene glycol  17 g Oral BID  . senna  1 tablet Oral QHS    Infusions: . cefTRIAXone (ROCEPHIN)  IV Stopped (03/06/17 0815)  . dialysis replacement fluid (prismasate) 400 mL/hr at 03/06/17 0005  . dialysis replacement fluid (prismasate) 200 mL/hr at 03/05/17 1035  . dialysate (PRISMASATE) 1,500 mL/hr at 03/06/17 0747  . sodium chloride      PRN Medications: ALPRAZolam, heparin, ondansetron (ZOFRAN) IV, promethazine, sodium chloride, traMADol, zolpidem    Patient Profile   84 yo with CKD stage IV, PAD, CHF, CAD s/p CABG, CHB s/p MDT PPM, COPD who was admitted with Strep pneumo PNA, COPD exacerbation, and acute/chronic systolic CHF + AKI on CKD.  Assessment/Plan   1. Acute on chronic systolic CHF: Echo this admission with EF 25-30%, down from about 45% in 11/18.  He has regional wall motion abnormalities.  He diuresed poorly in setting of AKI on CKD stage IV, got Lasix 160 mg IV bid with  minimal response.  Not on inotrope as co-ox has been good (inaccurate this morning).  I do not think AKI is driven primarily by CHF/low output in this situation.  BP has been normal to mildly elevated. He was started on CVVH 12/31 due to inability to diurese, weight now down and breathing better.  CVP 8-9.  - Would aim for at least 1 more day pulling fluid via CVVH and then will likely need to see how he does off CVVH.   - Can continue hydralazine/Imdur at current dosing for afterload reduction.  Continue Coreg at current dosing.  - If he recovers from the  current episode, will need to consider upgrade for BiV pacing.  2. AKI on CKD stage IV: BUN/creatinine steadily rose with poor diuresis.  I do not think that this is primarily cardiorenal though CHF certainly plays a role.  Co-ox has been good (though needs to be re-sent this morning) and BP has been stable.  It is possible that he was transiently hypotensive in setting of S pneumoniae sepsis with ATN superimposed on his baseline CKD stage IV. With intractable volume overload, now pulling fluid with CVVH. - Aim for at least 1 more day of CVVH most likely, will await nephrology input.  3. ID: Strep pneumoniae PNA, treating with ceftriaxone.  ?RA lead vegetation.  - Repeat blood cultures sent 12/30, negative to date.  - Will need TEE to look at [facility] wires, would wait until we have volume better controlled and determine trajectory of treatment (ongoing aggressive versus palliative).  If he has lead vegetation and we are proceeding with aggressive care, may need PPM out which would be a problem if he is PPM dependent (will need to discuss with EP).  4. COPD: Treating for AECOPD per IM.  He is on Solumedrol IV.  No wheezing on exam, suspect we can begin tapering this.  5. CAD: s/p CABG 2005.  No recent cath.  No chest pain but possible progressive coronary disease given fall in EF with WMAs.   - No current ACS so no cath at this time given AKI.  - Continue  ASA and statin.  6. PAD: s/p aortobifemoral bypass.  7. Aspiration risk: He will need swallow study.  8. Delirium: Suspect related to uremia, acute medical illness.  9. He is current DNR.  He has met with palliative care.  Willing to undergo trial of CVVH but does not want long-term HD.   Length of Stay: 7  Loralie Champagne, MD  03/06/2017, 8:52 AM  Advanced Heart Failure Team Pager [phone] (M-F; 7a - 4p)  Please contact [facility] for night-coverage after hours (4p -7a ) and weekends on [email or web address]

## 2017-03-06 NOTE — Progress Notes (Signed)
Patient ID: [name] [name], male   DOB: Jun 15, 1931, 84 y.o.   MRN: [id number] I spoke with Mr. And Mrs. Russi regarding his renal failure and lack of renal recovery.  [name]'s daughter was also in the room.  [name] had expressed that he did not want to pursue dialysis long term and understands that his health has been failing over the last year.  We discussed what is required to undergo outpatient hemodialysis and he and his wife admit that he was very weak before this hospitalization and has difficulty sitting up for prolonged periods of time even at home.  We also discussed that dialysis would not fix his lung or heart issues (EF of 25-30%), nor improve his functional status.  We discussed home hemodialysis and the need for permanent vascular access and the time required for an AVF or AVG to mature.  They are leaning towards comfort but would like to continue with CVVHD for now and consider this for a day or so.  They have been open to discussions with Palliative Care as well.  It is probably a good idea to give him more time on CVVHD as he was uremic and is only now starting to think more clearly.  He would be a marginal long term dialysis candidate given his poor EF and functional status.

## 2017-03-07 ENCOUNTER — Inpatient Hospital Stay (HOSPITAL_COMMUNITY): Payer: Medicare Other

## 2017-03-07 DIAGNOSIS — Z992 Dependence on renal dialysis: Secondary | ICD-10-CM

## 2017-03-07 DIAGNOSIS — R0609 Other forms of dyspnea: Secondary | ICD-10-CM

## 2017-03-07 DIAGNOSIS — J9601 Acute respiratory failure with hypoxia: Secondary | ICD-10-CM

## 2017-03-07 DIAGNOSIS — N186 End stage renal disease: Secondary | ICD-10-CM

## 2017-03-07 LAB — GLUCOSE, CAPILLARY
GLUCOSE-CAPILLARY: 101 mg/dL — AB (ref 65–99)
GLUCOSE-CAPILLARY: 135 mg/dL — AB (ref 65–99)
GLUCOSE-CAPILLARY: 175 mg/dL — AB (ref 65–99)
GLUCOSE-CAPILLARY: 212 mg/dL — AB (ref 65–99)
GLUCOSE-CAPILLARY: 116 mg/dL — AB (ref 65–99)

## 2017-03-07 LAB — CBC WITH DIFFERENTIAL/PLATELET
BASOS ABS: 0 10*3/uL (ref 0.0–0.1)
Band Neutrophils: 0 %
Basophils Relative: 0 %
Blasts: 0 %
EOS PCT: 0 %
Eosinophils Absolute: 0 10*3/uL (ref 0.0–0.7)
HCT: 34.1 % — ABNORMAL LOW (ref 39.0–52.0)
Hemoglobin: 10.6 g/dL — ABNORMAL LOW (ref 13.0–17.0)
LYMPHS ABS: 0.6 10*3/uL — AB (ref 0.7–4.0)
Lymphocytes Relative: 3 %
MCH: 28.6 pg (ref 26.0–34.0)
MCHC: 31.1 g/dL (ref 30.0–36.0)
MCV: 92.2 fL (ref 78.0–100.0)
METAMYELOCYTES PCT: 1 %
MONO ABS: 0 10*3/uL — AB (ref 0.1–1.0)
MYELOCYTES: 1 %
Monocytes Relative: 0 %
NEUTROS PCT: 95 %
Neutro Abs: 20 10*3/uL — ABNORMAL HIGH (ref 1.7–7.7)
Other: 0 %
Platelets: 285 10*3/uL (ref 150–400)
Promyelocytes Absolute: 0 %
RBC: 3.7 MIL/uL — AB (ref 4.22–5.81)
RDW: 15.3 % (ref 11.5–15.5)
WBC: 20.6 10*3/uL — AB (ref 4.0–10.5)
nRBC: 0 /100 WBC

## 2017-03-07 LAB — RENAL FUNCTION PANEL
ALBUMIN: 2.8 g/dL — AB (ref 3.5–5.0)
Anion gap: 7 (ref 5–15)
BUN: 46 mg/dL — AB (ref 6–20)
CALCIUM: 8.8 mg/dL — AB (ref 8.9–10.3)
CO2: 24 mmol/L (ref 22–32)
Chloride: 103 mmol/L (ref 101–111)
Creatinine, Ser: 1.91 mg/dL — ABNORMAL HIGH (ref 0.61–1.24)
GFR, EST AFRICAN AMERICAN: 36 mL/min — AB (ref >60)
GFR, EST NON AFRICAN AMERICAN: 31 mL/min — AB (ref >60)
Glucose, Bld: 116 mg/dL — ABNORMAL HIGH (ref 65–99)
PHOSPHORUS: 3.6 mg/dL (ref 2.5–4.6)
Potassium: 4.6 mmol/L (ref 3.5–5.1)
Sodium: 134 mmol/L — ABNORMAL LOW (ref 135–145)

## 2017-03-07 LAB — BASIC METABOLIC PANEL
Anion gap: 8 (ref 5–15)
BUN: 45 mg/dL — ABNORMAL HIGH (ref 6–20)
CALCIUM: 8.7 mg/dL — AB (ref 8.9–10.3)
CHLORIDE: 103 mmol/L (ref 101–111)
CO2: 23 mmol/L (ref 22–32)
CREATININE: 1.94 mg/dL — AB (ref 0.61–1.24)
GFR calc non Af Amer: 30 mL/min — ABNORMAL LOW (ref >60)
GFR, EST AFRICAN AMERICAN: 35 mL/min — AB (ref >60)
Glucose, Bld: 116 mg/dL — ABNORMAL HIGH (ref 65–99)
Potassium: 4.7 mmol/L (ref 3.5–5.1)
SODIUM: 134 mmol/L — AB (ref 135–145)

## 2017-03-07 LAB — COOXEMETRY PANEL
CARBOXYHEMOGLOBIN: 0.8 % (ref 0.5–1.5)
METHEMOGLOBIN: 1.4 % (ref 0.0–1.5)
O2 SAT: 62.3 %
Total hemoglobin: 10.9 g/dL — ABNORMAL LOW (ref 12.0–16.0)

## 2017-03-07 LAB — MAGNESIUM: MAGNESIUM: 2.6 mg/dL — AB (ref 1.7–2.4)

## 2017-03-07 MED ORDER — PROMETHAZINE HCL 25 MG/ML IJ SOLN: 12.5000 mg | Freq: Four times a day (QID) | INTRAMUSCULAR | Status: DC | PRN

## 2017-03-07 MED ORDER — IPRATROPIUM-ALBUTEROL 0.5-2.5 (3) MG/3ML IN SOLN: 3.0000 mL | Freq: Four times a day (QID) | RESPIRATORY_TRACT | Status: DC | PRN

## 2017-03-07 MED ORDER — PRISMASOL BGK 4/2.5 32-4-2.5 MEQ/L IV SOLN
INTRAVENOUS | Status: DC
  Administered 2017-03-07 – 2017-03-08 (×7): via INTRAVENOUS_CENTRAL
  Filled 2017-03-07 (×10): qty 5000

## 2017-03-07 MED ORDER — PRISMASOL BGK 4/2.5 32-4-2.5 MEQ/L IV SOLN
INTRAVENOUS | Status: DC
  Administered 2017-03-08: 02:00:00 via INTRAVENOUS_CENTRAL
  Filled 2017-03-07 (×3): qty 5000

## 2017-03-07 MED ORDER — PRISMASOL BGK 4/2.5 32-4-2.5 MEQ/L IV SOLN
INTRAVENOUS | Status: DC
  Administered 2017-03-07: 14:00:00 via INTRAVENOUS_CENTRAL
  Filled 2017-03-07 (×2): qty 5000

## 2017-03-07 MED ORDER — HEPARIN SODIUM (PORCINE) 1000 UNIT/ML DIALYSIS
1000.0000 [IU] | INTRAMUSCULAR | Status: DC | PRN
  Filled 2017-03-07: qty 6

## 2017-03-07 MED ORDER — MORPHINE SULFATE (CONCENTRATE) 10 MG/0.5ML PO SOLN
5.0000 mg | ORAL | Status: DC | PRN
  Administered 2017-03-07 – 2017-03-08 (×3): 5 mg via ORAL
  Filled 2017-03-07 (×3): qty 0.5

## 2017-03-07 MED ORDER — SODIUM CHLORIDE 0.9 % FOR CRRT
INTRAVENOUS_CENTRAL | Status: DC | PRN
  Filled 2017-03-07: qty 1000

## 2017-03-07 MED ORDER — SODIUM CHLORIDE 0.9 % IV SOLN
12.5000 mg | Freq: Once | INTRAVENOUS | Status: AC
  Administered 2017-03-07: 12.5 mg via INTRAVENOUS
  Filled 2017-03-07: qty 0.5

## 2017-03-07 NOTE — Progress Notes (Signed)
PULMONARY / CRITICAL CARE MEDICINE   Name: DENMAN PICHARDO MRN: [id number] DOB: 05/11/1931    ADMISSION DATE:  02/27/2017 CONSULTATION DATE:  03/05/17  REFERRING MD:  Aundra Dubin  CHIEF COMPLAINT:  AKI  HISTORY OF PRESENT ILLNESS:  [name] [name] is a 84 y.o. male with PMH as outlined below. He was admitted 02/27/17 with encephalopathy, sepsis due to PNA, A on C combined heart failure with acute exacerbation.  He was admitted to SDU and was started on BiPAP as well as broad spectrum abx.  Blood cultures then returned positive for Streptococcus; therefore, abx were narrowed to ceftriaxone.  He was started on diuresis with little effect; however, aggressive diuresis was restricted due to worsening renal failure.  Echo was performed 12/18 and demonstrated EF 25-30% (down from 45% prior on 11/18) with concern for right atrial pacemaker lead vegetation (TEE pending). CRRT was considered 12/28; however, decision was made to attempt more aggressive diuresis. Unfortunately, he did not respond; therefore, decision was made to start with CRRT 12/31.  He has DNR orders in place; however, he is willing to undergo a short term trial (~48 hours) of CRRT.  12/31, he was transferred to the ICU and PCCM was asked to assume care.   SUBJECTIVE: Had some dyspnea overnight Couldn't tolerate BIPAP overnight  VITAL SIGNS: BP 135/86 (BP Location: Left Arm)   Pulse 79   Temp 97.6 F (36.4 C) (Oral)   Resp 14   Ht 5\' 10"  (1.778 m)   Wt 89.9 kg (198 lb 3.1 oz)   SpO2 100%   BMI 28.44 kg/m   HEMODYNAMICS: CVP:  [5 mmHg-10 mmHg] 7 mmHg  VENTILATOR SETTINGS: FiO2 (%):  [35 %] 35 %  INTAKE / OUTPUT: I/O last 3 completed shifts: In: 47 [P.O.:210; I.V.:365] Out: 1443 [Urine:250; XVQMG:8676]   PHYSICAL EXAMINATION:  General:  Resting comfortably in bed HENT: NCAT OP clear PULM: CTA B, normal effort CV: RRR, no mgr GI: BS+, soft, nontender MSK: normal bulk and tone Neuro: awake, alert, no distress,  MAEW   LABS:  BMET Recent Labs  Lab 03/06/17 1549 03/07/17 0504 03/07/17 0509  NA 134* 134* 134*  K 4.5 4.6 4.7  CL 99* 103 103  CO2 25 24 23   BUN 58* 46* 45*  CREATININE 2.22* 1.91* 1.94*  GLUCOSE 131* 116* 116*    Electrolytes Recent Labs  Lab 03/03/17 0320  03/06/17 0400 03/06/17 1549 03/07/17 0504 03/07/17 0509  CALCIUM 8.7*   < > 8.3* 8.7* 8.8* 8.7*  MG 2.8*  --  2.6*  --   --  2.6*  PHOS  --    < > 5.3* 4.2 3.6  --    < > = values in this interval not displayed.    CBC Recent Labs  Lab 03/05/17 0410 03/06/17 0400 03/07/17 0509  WBC 11.3* 13.4* 20.6*  HGB 8.8* 9.9* 10.6*  HCT 28.0* 31.7* 34.1*  PLT 313 303 285    Coag's No results for input(s): APTT, INR in the last 168 hours.  Sepsis Markers No results for input(s): LATICACIDVEN, PROCALCITON, O2SATVEN in the last 168 hours.  ABG Recent Labs  Lab 03/06/17 1426  PHART 7.358  PCO2ART 43.2  PO2ART 75.0*    Liver Enzymes Recent Labs  Lab 03/06/17 0400 03/06/17 1549 03/07/17 0504  ALBUMIN 2.8* 2.9* 2.8*    Cardiac Enzymes No results for input(s): TROPONINI, PROBNP in the last 168 hours.  Glucose Recent Labs  Lab 03/06/17 0737 03/06/17 1125 03/06/17 1516 03/06/17  2310 03/07/17 0341 03/07/17 0807  GLUCAP 141* 118* 121* 88 101* 135*    Imaging Dg Chest Port 1 View  Result Date: 03/07/2017 CLINICAL DATA:  Shortness of Breath EXAM: PORTABLE CHEST 1 VIEW COMPARISON:  03/06/2017 FINDINGS: Cardiac shadow is stable. Postoperative changes are again seen. Pacing device is again noted. Bilateral jugular central lines are again seen and stable. No pneumothorax is noted. Vascular congestion with interstitial edema is noted. No new focal abnormality is seen. IMPRESSION: Stable vascular congestion interstitial edema. Electronically Signed   By: Inez Catalina M.D.   On: 03/07/2017 07:00   Dg Chest Port 1 View  Result Date: 03/06/2017 CLINICAL DATA:  84 year old male with a history of shortness  of breath EXAM: PORTABLE CHEST 1 VIEW COMPARISON:  03/05/2017 FINDINGS: Cardiomediastinal silhouette unchanged. Surgical changes of median sternotomy and CABG. Unchanged left chest wall cardiac pacing device. Unchanged right IJ central venous catheter with the tip appearing to terminate superior vena cava. Unchanged position of temporary left IJ hemodialysis catheter which terminates at the confluence of superior vena cava and left brachiocephalic vein. Similar appearance of right-sided airspace opacity, obscuring the right hemidiaphragm and right heart border. Mixed interstitial and airspace opacities bilaterally. Interlobular septal thickening. IMPRESSION: Similar appearance of the chest x-ray with pulmonary edema and right-sided pleural effusion. Unchanged left IJ temporary hemodialysis catheter, right IJ central venous catheter, and the cardiac pacing device. Surgical changes of median sternotomy and CABG, with cardiomegaly. Electronically Signed   By: Corrie Mckusick D.O.   On: 03/06/2017 09:59     STUDIES:  Renal US 12/28 > no hydro, chronic renal disease. CXR 12/29 > interval placement of right IJ CVL, b/l effusions, cardiomegaly Echo 12/27 > EF 25-30%, possible vegetation on RA lead of PM/ICD.  CULTURES: Blood 12/25 > strep pneumonia  ANTIBIOTICS: Vanc 12/25 > 12/26 Zosyn 12/25 > 12/25 Ceftriaxone 12/26 >   SIGNIFICANT EVENTS: 12/25 > admit 12/26 > cardiology consult 12/28 > nephrology consult 12/30 > palliative care consult, advanced heart failure team consult 12/31 > PCCM consult 12/31> CVVHD x 48 hours  LINES/TUBES: Right IJ CVL 12/29 >   Left IJ HD cath 12/31 >   DISCUSSION: 84 y.o. male with AoC combined heart failure, strep pneumonia and AKI.  Currently on CVVHD.     ASSESSMENT / PLAN:  PULMONARY A: Dyspnea COPD, not in exacerbation P:   BIPAP prn at this point   CARDIOVASCULAR A:  AoC combined heart failure - now with acute exacerbation. Hx CHB s/p PPM, MI,  CAD s/p CABG 2005, HTN, HLD, AAA s/p repair 2014. Tolerating CVVHD with - 2L, exam improved,  CVP is 8 ( 1/1) P:  Continue imdur/hydralazine per CHF team Tele Volume removal per HD   RENAL A:   Acute on chronic kidney failure CVVHD initiated 03/05/2017 plan  x 48 hours Creatinine responding P:   Monitor BMET Replace electrolytes as needed Continue CVVHD through tomorrow   GASTROINTESTINAL A:   Nutrition P:   Heart healthy diet  HEMATOLOGIC A:   Anemia - chronic VTE Prophylaxis P:  Transfuse for Hgb < 7gm/dL  INFECTIOUS A:   Strep pneumonia ? RA vegetation - some concern on TTE P:   Continuing ceftriaxone for now  ENDOCRINE A:   Hx DM II P:   SSI  NEUROLOGIC A:   Hx CVA P:   Limit sedating meds  Discussion: Had goals of care discussion with family today with bedside RN, Wadie Lessen, and Dr. Marval Regal from nephrology.  All are in agreement that he will not survive this illness and we will make efforts to get him home with hospice.  Will continue HD until discharge (probably tomorrow)  Family updated: Wife  updated at bedside 03/06/2016.  Interdisciplinary Family Meeting v Palliative Care Meeting:  Due by: 03/11/17.  My cc time 40 minutes discussing end of life care with family  Roselie Awkward, MD [place] [facility] Pager: [phone] Cell: [phone] After 3pm or if no response, call [phone]  03/07/2017, 8:40 AM

## 2017-03-07 NOTE — Progress Notes (Signed)
RN notified RT patient was not tolerating bipap, pulling at mask/getting aggravated. Pt placed pt back on 3 L [place] at this time. RT will continue to monitor.

## 2017-03-07 NOTE — Progress Notes (Addendum)
[facility] Liaison:  RN visit  Notified by Aldona Bar [facility], of patient/family request for [facility] services at home after discharge.  Chart and patient information has been reviewed by [facility] physician.  Patient is eligible for hospice services.   Writer spoke with Izora Gala, wife, at bedside to initiate education related to hospice philosophy, services and team approach to care.  Doren Custard, son, (his wife and granddaughter) were also in the room.  Family verbalized understanding of information given.  Per discussion, plan is for discharge home by [facility] on 03/08/17.  Please send signed and completed DNR form home with patient/family.  Patient will need presciptions for discharge comfort medications.   DME needs have been discussed, patient currently has the following equipment in the home:  Cane, walker, nebulizer machine.  Patient/family requests the following DME for delivery to the home:  Hospital bed 1/2 rails, OBT, and O2 concentrator.  [facility] equipment manager has been notified and will contact [place] to arrange delivery to the home.  Home address has been verified and is correct in the chart.  Doren Custard, son, is there family member to contact to arrange time of delivery at [phone].  [facility] aware of the above.  Completed discharge summary will need to be faxed to [facility] at [phone], when final.  Please notify [facility] when patient is ready to leave the unit at discharge.  (Call [phone] or [phone] after 5pm.)  [facility] information and contact numbers given to [facility] during this visit.  Above information shared with Aldona Bar, [facility].  Please call with any hospice related questions.   Thank you for this referral.  Edyth Gunnels, RN, BSN [facility] Liaison [phone] liaisons are now on [place].

## 2017-03-07 NOTE — Progress Notes (Addendum)
Patient ID: [name] [name], male   DOB: [birth year], 84 y.o.   MRN: [id number] S:He had some SOB last night and couldn't tolerate BIPap.  Family, PCCM, and Palliative care at bedside.  We discussed his current condition and discussed goals of care.  He has decided to go home with hospice. O:BP 135/86 (BP Location: Left Arm)   Pulse 79   Temp 97.6 F (36.4 C) (Oral)   Resp 14   Ht 5\' 10"  (1.778 m)   Wt 89.9 kg (198 lb 3.1 oz)   SpO2 100%   BMI 28.44 kg/m   Intake/Output Summary (Last 24 hours) at 03/07/2017 0907 Last data filed at 03/07/2017 0800 Gross per 24 hour  Intake 470 ml  Output 2645 ml  Net -2175 ml   Intake/Output: I/O last 3 completed shifts: In: 68 [P.O.:210; I.V.:365] Out: 4098 [Urine:250; JXBJY:7829]  Intake/Output this shift:  Total I/O In: 60 [I.V.:10; Other:50] Out: 129 [Other:129] Weight change: -2.5 kg (-8.2 oz)  Recent Labs  Lab 03/04/17 0545 03/05/17 0410 03/05/17 1640 03/06/17 0400 03/06/17 1549 03/07/17 0504 03/07/17 0509  NA 137 137 138 137 134* 134* 134*  K 4.5 4.3 4.4 4.3 4.5 4.6 4.7  CL 104 100* 101 101 99* 103 103  CO2 19* 22 23 25 25 24 23   GLUCOSE 171* 203* 117* 146* 131* 116* 116*  BUN 115* 136* 109* 78* 58* 46* 45*  CREATININE 4.27* 4.79* 3.82* 2.80* 2.22* 1.91* 1.94*  ALBUMIN  --   --  2.8* 2.8* 2.9* 2.8*  --   CALCIUM 8.4* 8.2* 8.2* 8.3* 8.7* 8.8* 8.7*  PHOS  --   --  7.5* 5.3* 4.2 3.6  --    Liver Function Tests: Recent Labs  Lab 03/06/17 0400 03/06/17 1549 03/07/17 0504  ALBUMIN 2.8* 2.9* 2.8*   No results for input(s): LIPASE, AMYLASE in the last 168 hours. No results for input(s): AMMONIA in the last 168 hours. CBC: Recent Labs  Lab 03/02/17 0758 03/03/17 0320 03/05/17 0410 03/06/17 0400 03/07/17 0509  WBC 15.7* 12.8* 11.3* 13.4* 20.6*  NEUTROABS  --   --  10.7* 12.6* 20.0*  HGB 9.9* 9.4* 8.8* 9.9* 10.6*  HCT 30.9* 30.1* 28.0* 31.7* 34.1*  MCV 91.7 92.6 92.7 92.2 92.2  PLT 284 281 313 303 285   Cardiac  Enzymes: No results for input(s): CKTOTAL, CKMB, CKMBINDEX, TROPONINI in the last 168 hours. CBG: Recent Labs  Lab 03/06/17 1125 03/06/17 1516 03/06/17 2310 03/07/17 0341 03/07/17 0807  GLUCAP 118* 121* 88 101* 135*    Iron Studies:  Recent Labs    03/05/17 1337  IRON 39*  TIBC 217*  FERRITIN 344*   Studies/Results: Dg Chest Port 1 View  Result Date: 03/07/2017 CLINICAL DATA:  Shortness of Breath EXAM: PORTABLE CHEST 1 VIEW COMPARISON:  03/06/2017 FINDINGS: Cardiac shadow is stable. Postoperative changes are again seen. Pacing device is again noted. Bilateral jugular central lines are again seen and stable. No pneumothorax is noted. Vascular congestion with interstitial edema is noted. No new focal abnormality is seen. IMPRESSION: Stable vascular congestion interstitial edema. Electronically Signed   By: Inez Catalina M.D.   On: 03/07/2017 07:00   Dg Chest Port 1 View  Result Date: 03/06/2017 CLINICAL DATA:  84 year old male with a history of shortness of breath EXAM: PORTABLE CHEST 1 VIEW COMPARISON:  03/05/2017 FINDINGS: Cardiomediastinal silhouette unchanged. Surgical changes of median sternotomy and CABG. Unchanged left chest wall cardiac pacing device. Unchanged right IJ central venous catheter with the  tip appearing to terminate superior vena cava. Unchanged position of temporary left IJ hemodialysis catheter which terminates at the confluence of superior vena cava and left brachiocephalic vein. Similar appearance of right-sided airspace opacity, obscuring the right hemidiaphragm and right heart border. Mixed interstitial and airspace opacities bilaterally. Interlobular septal thickening. IMPRESSION: Similar appearance of the chest x-ray with pulmonary edema and right-sided pleural effusion. Unchanged left IJ temporary hemodialysis catheter, right IJ central venous catheter, and the cardiac pacing device. Surgical changes of median sternotomy and CABG, with cardiomegaly. Electronically  Signed   By: Corrie Mckusick D.O.   On: 03/06/2017 09:59   Dg Chest Port 1 View  Result Date: 03/05/2017 CLINICAL DATA:  Status post left internal jugular catheter placement. EXAM: PORTABLE CHEST 1 VIEW COMPARISON:  Portable chest x-ray of March 03, 2017 FINDINGS: A dual-lumen left IJ catheter is been placed. The tip projects at the junction of the right and left brachiocephalic vein. The right IJ catheter is stable with its tip projecting over the midportion of the SVC. There is no pneumothorax or significant pleural effusion on the left. There is a small pleural effusion on the right. The cardiac silhouette remains enlarged. The pulmonary vascularity is slightly less engorged today. There is calcification in the wall of the aortic arch. The sternal wires are intact. The ICD is in stable position. IMPRESSION: No postprocedure complication following left IJV catheter placement. Slight interval improvement in CHF. Persistent right pleural effusion layering posteriorly. Thoracic aortic atherosclerosis. Electronically Signed   By: David  Martinique M.D.   On: 03/05/2017 10:16   . aspirin EC  81 mg Oral Daily  . atorvastatin  40 mg Oral Daily  . calcium acetate  2,001 mg Oral TID WC  . calcium acetate  667 mg Oral With snacks  . carvedilol  3.125 mg Oral BID WC  . darbepoetin (ARANESP) injection - NON-DIALYSIS  150 mcg Subcutaneous Q Mon-1800  . escitalopram  5 mg Oral QPM  . gabapentin  300 mg Oral Daily  . heparin  5,000 Units Subcutaneous Q8H  . hydrALAZINE  25 mg Oral Q8H  . insulin aspart  0-15 Units Subcutaneous TID WC  . insulin aspart  0-5 Units Subcutaneous QHS  . ipratropium-albuterol  3 mL Nebulization Q6H  . isosorbide mononitrate  30 mg Oral Daily  . mouth rinse  15 mL Mouth Rinse BID  . Melatonin  6 mg Oral QHS  . methylPREDNISolone (SOLU-MEDROL) injection  40 mg Intravenous Q12H  . polyethylene glycol  17 g Oral BID  . senna  1 tablet Oral QHS    BMET    Component Value  Date/Time   NA 134 (L) 03/07/2017 0509   NA 142 02/16/2017 1153   K 4.7 03/07/2017 0509   CL 103 03/07/2017 0509   CO2 23 03/07/2017 0509   GLUCOSE 116 (H) 03/07/2017 0509   BUN 45 (H) 03/07/2017 0509   BUN 28 (H) 02/16/2017 1153   CREATININE 1.94 (H) 03/07/2017 0509   CREATININE 1.77 (H) 01/19/2015 1639   CALCIUM 8.7 (L) 03/07/2017 0509   GFRNONAA 30 (L) 03/07/2017 0509   GFRAA 35 (L) 03/07/2017 0509   CBC    Component Value Date/Time   WBC 20.6 (H) 03/07/2017 0509   RBC 3.70 (L) 03/07/2017 0509   HGB 10.6 (L) 03/07/2017 0509   HCT 34.1 (L) 03/07/2017 0509   PLT 285 03/07/2017 0509   MCV 92.2 03/07/2017 0509   MCH 28.6 03/07/2017 0509   MCHC 31.1 03/07/2017  0509   RDW 15.3 03/07/2017 0509   LYMPHSABS 0.6 (L) 03/07/2017 0509   MONOABS 0.0 (L) 03/07/2017 0509   EOSABS 0.0 03/07/2017 0509   BASOSABS 0.0 03/07/2017 0509     Assessment/Plan:  1. SOB- due to COPD and CHF did not tolerate BiPap. 2. AKI/CKD without recovery of renal function (now oliguric/anuric).  Currently on CVVHD 3. Disposition- Family, PCCM, and Palliative care at bedside.  We discussed his current condition and discussed goals of care.  He has decided to go home with hospice.  Agree with the decision and am happy the family is willing to take him home.  Palliative Care assisting with planning and coordinating his return home. CVVHD can be discontinued once things are set up for him.  Will sign off.  Please call with any questions or concerns.   Donetta Potts, MD [employer] [phone]

## 2017-03-07 NOTE — Progress Notes (Signed)
Patient ID: [name] [name], male   DOB: 17-Oct-1931, 84 y.o.   MRN: [id number]     Advanced Heart Failure Rounding Note   Subjective:    CVVH started 12/31.   Weight down this morning with net 2L fluid off.  He says that his breathing is improved.  Patient made NPO for now with coughing with eating 03/05/17.   Alert this am. Intermittently confused per his wife. Denies SOB.   Coox 62.3% this am. CVP 6-7. Down 5 lbs. CVVH removing ~ 100 cc an hour.  Objective:   Weight Range: 198 lb 3.1 oz (89.9 kg) Body mass index is 28.44 kg/m.   Vital Signs:   Temp:  [97.3 F (36.3 C)-97.9 F (36.6 C)] 97.3 F (36.3 C) (01/02 0330) Pulse Rate:  [59-86] 72 (01/02 0700) Resp:  [11-20] 16 (01/02 0700) BP: (113-145)/(67-122) 113/67 (01/02 0700) SpO2:  [96 %-100 %] 99 % (01/02 0700) FiO2 (%):  [35 %] 35 % (01/02 0126) Weight:  [198 lb 3.1 oz (89.9 kg)] 198 lb 3.1 oz (89.9 kg) (01/02 0500) Last BM Date: 03/05/17  Weight change: Filed Weights   03/05/17 0324 03/06/17 0500 03/07/17 0500  Weight: 210 lb 12.2 oz (95.6 kg) 203 lb 11.3 oz (92.4 kg) 198 lb 3.1 oz (89.9 kg)    Intake/Output:   Intake/Output Summary (Last 24 hours) at 03/07/2017 0739 Last data filed at 03/07/2017 0700 Gross per 24 hour  Intake 425 ml  Output 3029 ml  Net -2604 ml      Physical Exam    General:NAD HEENT: Normal Neck: Supple. JVP 6-7 at least. Difficult with LIJ Trialysis Carotids 2+ bilat; no bruits. No thyromegaly or nodule noted. Cor: PMI nondisplaced. RRR, No M/G/R noted Lungs: Dependent crackles. Abdomen: Soft, non-tender, non-distended, no HSM. No bruits or masses. +BS  Extremities: No cyanosis, clubbing, or rash. Trace ankle edema.  Neuro: Alert but not oriented.   Telemetry   NSR with RV pacing, personally reviewed.   Labs    CBC Recent Labs    03/06/17 0400 03/07/17 0509  WBC 13.4* 20.6*  NEUTROABS 12.6* PENDING  HGB 9.9* 10.6*  HCT 31.7* 34.1*  MCV 92.2 92.2  PLT 303 354   Basic  Metabolic Panel Recent Labs    03/06/17 0400 03/06/17 1549 03/07/17 0504 03/07/17 0509  NA 137 134* 134* 134*  K 4.3 4.5 4.6 4.7  CL 101 99* 103 103  CO2 25 25 24 23   GLUCOSE 146* 131* 116* 116*  BUN 78* 58* 46* 45*  CREATININE 2.80* 2.22* 1.91* 1.94*  CALCIUM 8.3* 8.7* 8.8* 8.7*  MG 2.6*  --   --  2.6*  PHOS 5.3* 4.2 3.6  --    Liver Function Tests Recent Labs    03/06/17 1549 03/07/17 0504  ALBUMIN 2.9* 2.8*   No results for input(s): LIPASE, AMYLASE in the last 72 hours. Cardiac Enzymes No results for input(s): CKTOTAL, CKMB, CKMBINDEX, TROPONINI in the last 72 hours.  BNP: BNP (last 3 results) Recent Labs    01/22/17 2031 02/27/17 1338 03/01/17 1007  BNP 811.1* 1,214.4* 1,858.9*    ProBNP (last 3 results) Recent Labs    02/06/17 1302  PROBNP 2,488*     D-Dimer No results for input(s): DDIMER in the last 72 hours. Hemoglobin A1C No results for input(s): HGBA1C in the last 72 hours. Fasting Lipid Panel No results for input(s): CHOL, HDL, LDLCALC, TRIG, CHOLHDL, LDLDIRECT in the last 72 hours. Thyroid Function Tests No results for  input(s): TSH, T4TOTAL, T3FREE, THYROIDAB in the last 72 hours.  Invalid input(s): FREET3  Other results:   Imaging    Dg Chest Port 1 View  Result Date: 03/07/2017 CLINICAL DATA:  Shortness of Breath EXAM: PORTABLE CHEST 1 VIEW COMPARISON:  03/06/2017 FINDINGS: Cardiac shadow is stable. Postoperative changes are again seen. Pacing device is again noted. Bilateral jugular central lines are again seen and stable. No pneumothorax is noted. Vascular congestion with interstitial edema is noted. No new focal abnormality is seen. IMPRESSION: Stable vascular congestion interstitial edema. Electronically Signed   By: Inez Catalina M.D.   On: 03/07/2017 07:00   Dg Chest Port 1 View  Result Date: 03/06/2017 CLINICAL DATA:  84 year old male with a history of shortness of breath EXAM: PORTABLE CHEST 1 VIEW COMPARISON:  03/05/2017  FINDINGS: Cardiomediastinal silhouette unchanged. Surgical changes of median sternotomy and CABG. Unchanged left chest wall cardiac pacing device. Unchanged right IJ central venous catheter with the tip appearing to terminate superior vena cava. Unchanged position of temporary left IJ hemodialysis catheter which terminates at the confluence of superior vena cava and left brachiocephalic vein. Similar appearance of right-sided airspace opacity, obscuring the right hemidiaphragm and right heart border. Mixed interstitial and airspace opacities bilaterally. Interlobular septal thickening. IMPRESSION: Similar appearance of the chest x-ray with pulmonary edema and right-sided pleural effusion. Unchanged left IJ temporary hemodialysis catheter, right IJ central venous catheter, and the cardiac pacing device. Surgical changes of median sternotomy and CABG, with cardiomegaly. Electronically Signed   By: Corrie Mckusick D.O.   On: 03/06/2017 09:59     Medications:     Scheduled Medications: . aspirin EC  81 mg Oral Daily  . atorvastatin  40 mg Oral Daily  . calcium acetate  2,001 mg Oral TID WC  . calcium acetate  667 mg Oral With snacks  . carvedilol  3.125 mg Oral BID WC  . darbepoetin (ARANESP) injection - NON-DIALYSIS  150 mcg Subcutaneous Q Mon-1800  . escitalopram  5 mg Oral QPM  . gabapentin  300 mg Oral Daily  . heparin  5,000 Units Subcutaneous Q8H  . hydrALAZINE  25 mg Oral Q8H  . insulin aspart  0-15 Units Subcutaneous TID WC  . insulin aspart  0-5 Units Subcutaneous QHS  . ipratropium-albuterol  3 mL Nebulization Q6H  . isosorbide mononitrate  30 mg Oral Daily  . mouth rinse  15 mL Mouth Rinse BID  . Melatonin  6 mg Oral QHS  . methylPREDNISolone (SOLU-MEDROL) injection  40 mg Intravenous Q12H  . polyethylene glycol  17 g Oral BID  . senna  1 tablet Oral QHS    Infusions: . cefTRIAXone (ROCEPHIN)  IV Stopped (03/06/17 0815)  . dialysis replacement fluid (prismasate) 400 mL/hr at  03/07/17 0007  . dialysis replacement fluid (prismasate) 200 mL/hr at 03/06/17 0939  . dialysate (PRISMASATE) 1,500 mL/hr at 03/07/17 0713  . sodium chloride      PRN Medications: ALPRAZolam, heparin, ondansetron (ZOFRAN) IV, promethazine, sodium chloride, traMADol, zolpidem    Patient Profile   84 yo with CKD stage IV, PAD, CHF, CAD s/p CABG, CHB s/p MDT PPM, COPD who was admitted with Strep pneumo PNA, COPD exacerbation, and acute/chronic systolic CHF + AKI on CKD.  Assessment/Plan   1. Acute on chronic systolic CHF: Echo this admission with EF 25-30%, down from about 45% in 11/18.  He has regional wall motion abnormalities.  He diuresed poorly in setting of AKI on CKD stage IV, got Lasix 160  mg IV bid with minimal response.  Not on inotrope as co-ox has been good (inaccurate this morning). Do not think AKI is driven primarily by CHF/low output in this situation.  BP has been normal to mildly elevated. He was started on CVVH 12/31 due to inability to diurese, weight now down and breathing better.  - Long discussion with Renal yesterday. Continue CVVHD for at least today. May be heading towards comfort care.  - Pressures stable.  - Can continue hydralazine/Imdur at current dosing for afterload reduction.  Continue Coreg at current dosing.  - If he recovers from the current episode, will need to consider upgrade for BiV pacing.  2. AKI on CKD stage IV: BUN/creatinine steadily rose with poor diuresis.  I do not think that this is primarily cardiorenal though CHF certainly plays a role.  Co-ox has been good (though needs to be re-sent this morning) and BP has been stable.  It is possible that he was transiently hypotensive in setting of S pneumoniae sepsis with ATN superimposed on his baseline CKD stage IV. With intractable volume overload, now pulling fluid with CVVH. - Long discussion with Renal yesterday. Continue CVVHD for at least today. May be heading towards comfort care.  3. ID: Strep  pneumoniae PNA, treating with ceftriaxone.  ?RA lead vegetation.  - Repeat blood cultures sent 12/30, negative to date.  - Will need TEE to look at [facility] wires, would wait until we have volume better controlled and determine trajectory of treatment (ongoing aggressive versus palliative).  If he has lead vegetation and we are proceeding with aggressive care, may need PPM out which would be a problem if he is PPM dependent (will need to discuss with EP).  4. COPD: Treating for AECOPD per IM.  He is on Solumedrol IV.  - No wheezing. Steroids per CCM.  5. CAD: s/p CABG 2005.  No recent cath.  - No CP but possible progressive coronary disease given fall in EF with WMAs.   - No s/s of ischemia so no cath at this time given AKI.  - Continue ASA and statin.  6. PAD: s/p aortobifemoral bypass.  7. Aspiration risk:  - Now on full liquid diet.  8. Delirium: Suspect related to uremia, acute medical illness. No change. 9. He is current DNR.  He has met with palliative care.  Willing to undergo trial of CVVH but does not want long-term HD. No change.  Length of Stay: [address]  Annamaria Helling  03/07/2017, 7:39 AM  Advanced Heart Failure Team Pager [phone] (M-F; 7a - 4p)  Please contact [facility] for night-coverage after hours (4p -7a ) and weekends on [email or web address]  Patient seen with PA, agree with the above note.  Discussed with CCM, plan now home with hospice.  This is very reasonable, he would not want long-term HD.  We will sign off at this point.   Loralie Champagne 03/07/2017 1:06 PM

## 2017-03-07 NOTE — Progress Notes (Signed)
Patient ID: [name] [name], male   DOB: 18-May-1931, 84 y.o.   MRN: [id number]  This NP visited patient at the bedside as a follow up for palliative needs, emotional support and scheduled meeting with wife and sons for continue discussion regarding diagnosis prognosis, goals of care, treatment option decisions and EOL wishes and anticipatory care needs.  Patient is alert and  oriented but  weak.    Both Dr. Lake Bells and Dr. Blanche East spoke with family for medical update.  Understanding the poor prognosis and the patient's wish to not initiate outpatient hemodialysis decision is made to shift to comfort and begin the transition home with hospice.  Will write for choice, though family is requesting [facility] hospice  Focus of care is comfort and dignity at home  As discussed with attending leave current medicial interventions in place until discharge in the morning and then     - no further life prolonging measures    -DC telemetry, labs, and all diagnostics    -Minimize medications, use medications only what will enhance comfort     -DC CVVHD in the morning before discharge  CVVHD will be continued until discharge in the morning in order to give him the best chance of successful transition home to receive  EOL homecare with hospice;  which is his desire.  Prognosis is likely days once all supportive measures are stopped on discharge.  Questions and concerns addressed     Time in  0830        Time out   0930  Total time spent on the unit was 60  minutes  Discussed with Dr. Carollee Leitz and Dr Lamonte Sakai and bedside nursing  Greater than 50% of the time was spent in counseling and coordination of care  Wadie Lessen NP  Palliative Medicine Team Team Phone # [phone] Pager [phone]

## 2017-03-07 NOTE — Progress Notes (Signed)
CSW received call from RN informing CSW that pt's family is looking to speak with someone about home with hospice. At this time CSW has reached out to [facility] and Amy from [facility] and [place]. At this time there are no further CSW needs. CSW singing off.     Virgie Dad Giuliano Preece, MSW, [facility] Clinical Social Worker [phone]

## 2017-03-07 NOTE — Care Management Note (Signed)
Case Management Note Previous note created by Ellan Lambert  Patient Details  Name: [name] [name] MRN: [id number] Date of Birth: [birth year]  Subjective/Objective:    60yom presented with SOB, admitted for sepsis, pneumonia, COPD exacerbation. Found to have Strep pneumo bacteremia/sepsis. Hospitalization complicated by acute resp failure requiring intermittent BiPAP, acute CHF and AKI.  PTA, pt resided at home with spouse.                   Action/Plan: Pt currently requiring intermittent BIPAP.  Recommend PT/OT consults when able to tolerate therapies.  Will follow for dc planning as pt progresses.    Expected Discharge Date:             Expected Discharge Plan:  Home w Hospice Care  In-House Referral:     Discharge planning Services  CM Consult  Post Acute Care Choice:    Choice offered to:     DME Arranged:    DME Agency:     HH Arranged:    [place] Agency:     Status of Service:  In process, will continue to follow  If discussed at Long Length of Stay Meetings, dates discussed:    Additional Comments:  Elenor Quinones, RN, BSN [phone]  Family offered choice for home with hospice and chose [facility].  Plan is for pt to discharge home with [facility] tomorrow - family wants to continue CRRT for one additional day.  Agency contacted and referral under review.  Pt will need [facility] transportation at discharge

## 2017-03-08 LAB — GLUCOSE, CAPILLARY: Glucose-Capillary: 134 mg/dL — ABNORMAL HIGH (ref 65–99)

## 2017-03-08 MED ORDER — ALPRAZOLAM 0.5 MG PO TABS: 0.5000 mg | ORAL_TABLET | Freq: Three times a day (TID) | ORAL | 0 refills | Status: AC | PRN

## 2017-03-08 MED ORDER — MORPHINE SULFATE (CONCENTRATE) 10 MG/0.5ML PO SOLN: 5.0000 mg | ORAL | 0 refills | Status: DC | PRN

## 2017-03-08 MED ORDER — MORPHINE SULFATE (CONCENTRATE) 10 MG/0.5ML PO SOLN: 5.0000 mg | ORAL | 0 refills | Status: AC | PRN

## 2017-03-08 MED ORDER — IPRATROPIUM-ALBUTEROL 0.5-2.5 (3) MG/3ML IN SOLN: 3.0000 mL | Freq: Four times a day (QID) | RESPIRATORY_TRACT | 0 refills | Status: AC | PRN

## 2017-03-08 MED ORDER — SENNA 8.6 MG PO TABS: 1.0000 | ORAL_TABLET | Freq: Every day | ORAL | 0 refills | Status: AC

## 2017-03-08 MED ORDER — MELATONIN 3 MG PO TABS: 6.0000 mg | ORAL_TABLET | Freq: Every day | ORAL | 0 refills | Status: AC

## 2017-03-08 MED ORDER — ALPRAZOLAM 0.5 MG PO TABS: 0.5000 mg | ORAL_TABLET | Freq: Three times a day (TID) | ORAL | 0 refills | Status: DC | PRN

## 2017-03-08 NOTE — Progress Notes (Signed)
[facility] Liaison:  RN   Spoke with Anderson Malta, bedside RN, she advised that they have gone over discharge paperwork with patient and are getting him ready to be transported home.  She advised [facility] to be called for transport.   Thank you for the referral.  Edyth Gunnels, RN, [facility] Liaison [phone]  All hospital liaisons are now on [place].

## 2017-03-08 NOTE — Consult Note (Signed)
   [facility] CM Inpatient Consult   03/08/2017  DARELL SAPUTO [birth year] [id number]  Patient was reviewed in the Medicare ACO for 3 admissions in the past 6 months.  Chart review reveals the patient will transition to Hospice care.  Hospice notes reviewed.  Patient will receive care management in the hospice program.  Will sign off.  For questions, please contact:  Natividad Brood, RN BSN [facility] Liaison  [phone] business mobile phone Toll free office [phone]

## 2017-03-08 NOTE — Progress Notes (Signed)
[facility] P[facility]M   Quiet night Still on CVVHD  O: Vitals:   03/08/17 0600 03/08/17 0700 03/08/17 0800 03/08/17 0810  BP: 132/82 121/70  135/85  Pulse: 63 (!) 59 (!) 59 72  Resp: 20 20 12 13   Temp:      TempSrc:      SpO2: 98% 90% 99% 98%  Weight:      Height:       General:  Resting comfortably in bed HENT: NCAT OP clear PULM: CTA B, normal effort CV: RRR, no mgr GI: BS+, soft, nontender MSK: normal bulk and tone Neuro: awake, alert, no distress, MAEW  Impression Chronic systolic heart failure ESRD Strep bacteremia  Plan: Stop CVVHD Discharge home today with hospice after removing HD cath  Roselie Awkward, MD [facility] Pager: [phone] Cell: [phone] After 3pm or if no response, call [phone]

## 2017-03-08 NOTE — Care Management Note (Signed)
Case Management Note Previous note created by Ellan Lambert  Patient Details  Name: [name] [name] MRN: [id number] Date of Birth: [birth year]  Subjective/Objective:    31yom presented with SOB, admitted for sepsis, pneumonia, COPD exacerbation. Found to have Strep pneumo bacteremia/sepsis. Hospitalization complicated by acute resp failure requiring intermittent BiPAP, acute CHF and AKI.  PTA, pt resided at home with spouse.                   Action/Plan: Pt currently requiring intermittent BIPAP.  Recommend PT/OT consults when able to tolerate therapies.  Will follow for dc planning as pt progresses.    Expected Discharge Date:             Expected Discharge Plan:  Home w Hospice Care  In-House Referral:     Discharge planning Services  CM Consult  Post Acute Care Choice:    Choice offered to:     DME Arranged:    DME Agency:     HH Arranged:    [place] Agency:     Status of Service:  In process, will continue to follow  If discussed at Long Length of Stay Meetings, dates discussed:    Additional Comments: 03/08/16 Pt now has discharge orders.  CM faxed discharge summary to requested number of agency.  Agency aware of pts discharge today.  Bedside nurse will contact [facility] when appropriate and contact agency at discharge  03/07/16 Family offered choice for home with hospice and chose [facility].  Plan is for pt to discharge home with [facility] tomorrow - family wants to continue CRRT for one additional day.  Agency contacted and referral under review.  Pt will need [facility] transportation at discharge

## 2017-03-08 NOTE — Progress Notes (Signed)
Discharge information and prescriptions given to Patients wife. [facility] called for transporation home.

## 2017-03-08 NOTE — Progress Notes (Addendum)
[facility] Liaison:  RN visit  Notified by Aldona Bar [facility], of patient/family request for [facility] services at home after discharge.  Chart and patient information has been reviewed by [facility] physician.  Patient is eligible for hospice services.   Writer spoke with Izora Gala, wife, at bedside to initiate education related to hospice philosophy, services and team approach to care.  Doren Custard, son, (his wife and granddaughter) were also in the room.  Family verbalized understanding of information given.  Per discussion, plan is for discharge home by [facility] on 03/08/17.  Please send signed and completed DNR form home with patient/family.  Patient will need presciptions for discharge comfort medications.   DME equipment was delivered to the home 03/07/17 and received by Doren Custard, son.  I did speak with wife again this morning to follow up and answer any questions she may have at this time.  She advised no questions right now.  I spoke with Amber with [facility] and she will contact Mrs. Vanderheyden to set up a time that is approved for patient this afternoon upon returning home.   [facility] Referral Center aware of the above.  Completed discharge summary will need to be faxed to [facility] at [phone], when final.  Please notify [facility] when patient is ready to leave the unit at discharge.  (Call [phone] or [phone] after 5pm.)  [facility] information and contact numbers given to [facility] during this visit.  Above information shared with Aldona Bar, [facility].  Please call with any hospice related questions.   Thank you for this referral.  Edyth Gunnels, RN, BSN [facility] Liaison [phone] liaisons are now on [place].

## 2017-03-08 NOTE — Progress Notes (Signed)
SLP Cancellation Note  Patient Details Name: [name] [name] MRN: [id number] DOB: 1932/01/17   Cancelled treatment:       Reason Eval/Treat Not Completed: Other (comment) Pt/family have opted to discharge home with hospice support with GOC focused on comfort. Note that diet order is still for full liquids, although this could be liberated to match pt's goals. SLP to sign off - please reorder if needed.   Germain Osgood 03/08/2017, 8:47 AM  Germain Osgood, M.A. CCC-SLP [phone]

## 2017-03-08 NOTE — Discharge Summary (Addendum)
[facility] P[facility]M Discharge Summary  Date of admission: 02/27/2017  1:27 PM  Date of discharge: 03/08/2017  Primary diagnosis: Strep pneumonia bacteremia  Secondary diagnosis: Community-acquired pneumonia Acute on chronic kidney failure Chronic kidney failure Chronic systolic heart failure Acute respiratory failure with hypoxemia COPD exacerbation.  History of present illness and hospital course: This is an 84 year old male who was admitted on Christmas day with shortness of breath.  This was treated as a COPD exacerbation but he was found to have an infiltrate and strep pneumo bacteremia.  He was admitted by the medicine service.  He developed acute kidney injury not long after admission and had worsening shortness of breath.  He was transferred to the intensive care unit for further management of acute kidney injury with volume overload in the setting of chronic systolic heart failure.  He was treated with antibiotics.  Because his blood cultures were positive it was felt that he may have an infection of his pacemaker wires, and echocardiogram suggested this.  After arrival to the intensive care unit he was started on continuous venovenous hemodialysis and volume was removed.  His respiratory status improved significantly.  Cardiology and nephrology were consulted.  We hoped that we would see improvement in his urine output however this did not occur.  We had multiple conversations with the patient's family and the patient and his desire was to go home.  He understood that he would not survive this illness.  We made plans for him to be discharged on hospice.  Discharge diet: Regular  Discharge condition: Ill, going home on hospice  Discharge activity: As tolerated  Procedures during this admission: Right IJ central venous line Left IJ hemodialysis catheter CVVHD Echocardiogram  Discharge disposition: Home on hospice  Discharge medications: Xanax 0.5 mg 3 times a day as needed Lexapro 5 mg  nightly DuoNeb 3 mL every 6 hours as needed wheezing and shortness of breath Melatonin 6 mg nightly Morphine oral solution 5 mg every 2 hours as needed for moderate pain or shortness of breath Senokot tablet 8.6 mg daily nightly as needed constipation  > 30 min spent on hospital discharge  Roselie Awkward, MD [place] [facility]M Pager: [phone] Cell: [phone] After 3pm or if no response, call [phone]

## 2017-03-09 LAB — CULTURE, BLOOD (ROUTINE X 2)
CULTURE: NO GROWTH
Culture: NO GROWTH
Special Requests: ADEQUATE

## 2017-03-13 ENCOUNTER — Ambulatory Visit: Payer: Federal, State, Local not specified - PPO | Admitting: Physician Assistant

## 2017-03-19 ENCOUNTER — Encounter: Payer: Federal, State, Local not specified - PPO | Admitting: *Deleted

## 2017-03-22 ENCOUNTER — Encounter: Payer: Self-pay | Admitting: Cardiology

## 2017-04-06 DEATH — deceased

## 2017-04-23 ENCOUNTER — Other Ambulatory Visit: Payer: Medicare Other

## 2017-04-24 ENCOUNTER — Ambulatory Visit: Payer: Federal, State, Local not specified - PPO | Admitting: Internal Medicine

## 2018-06-11 IMAGING — DX DG CHEST 2V
2 series · 2 of 2 positions shown · non-contrast
Comparison: 02/27/2017

CLINICAL DATA: Pneumonia

EXAM:
CHEST  2 VIEW

[chest lat]
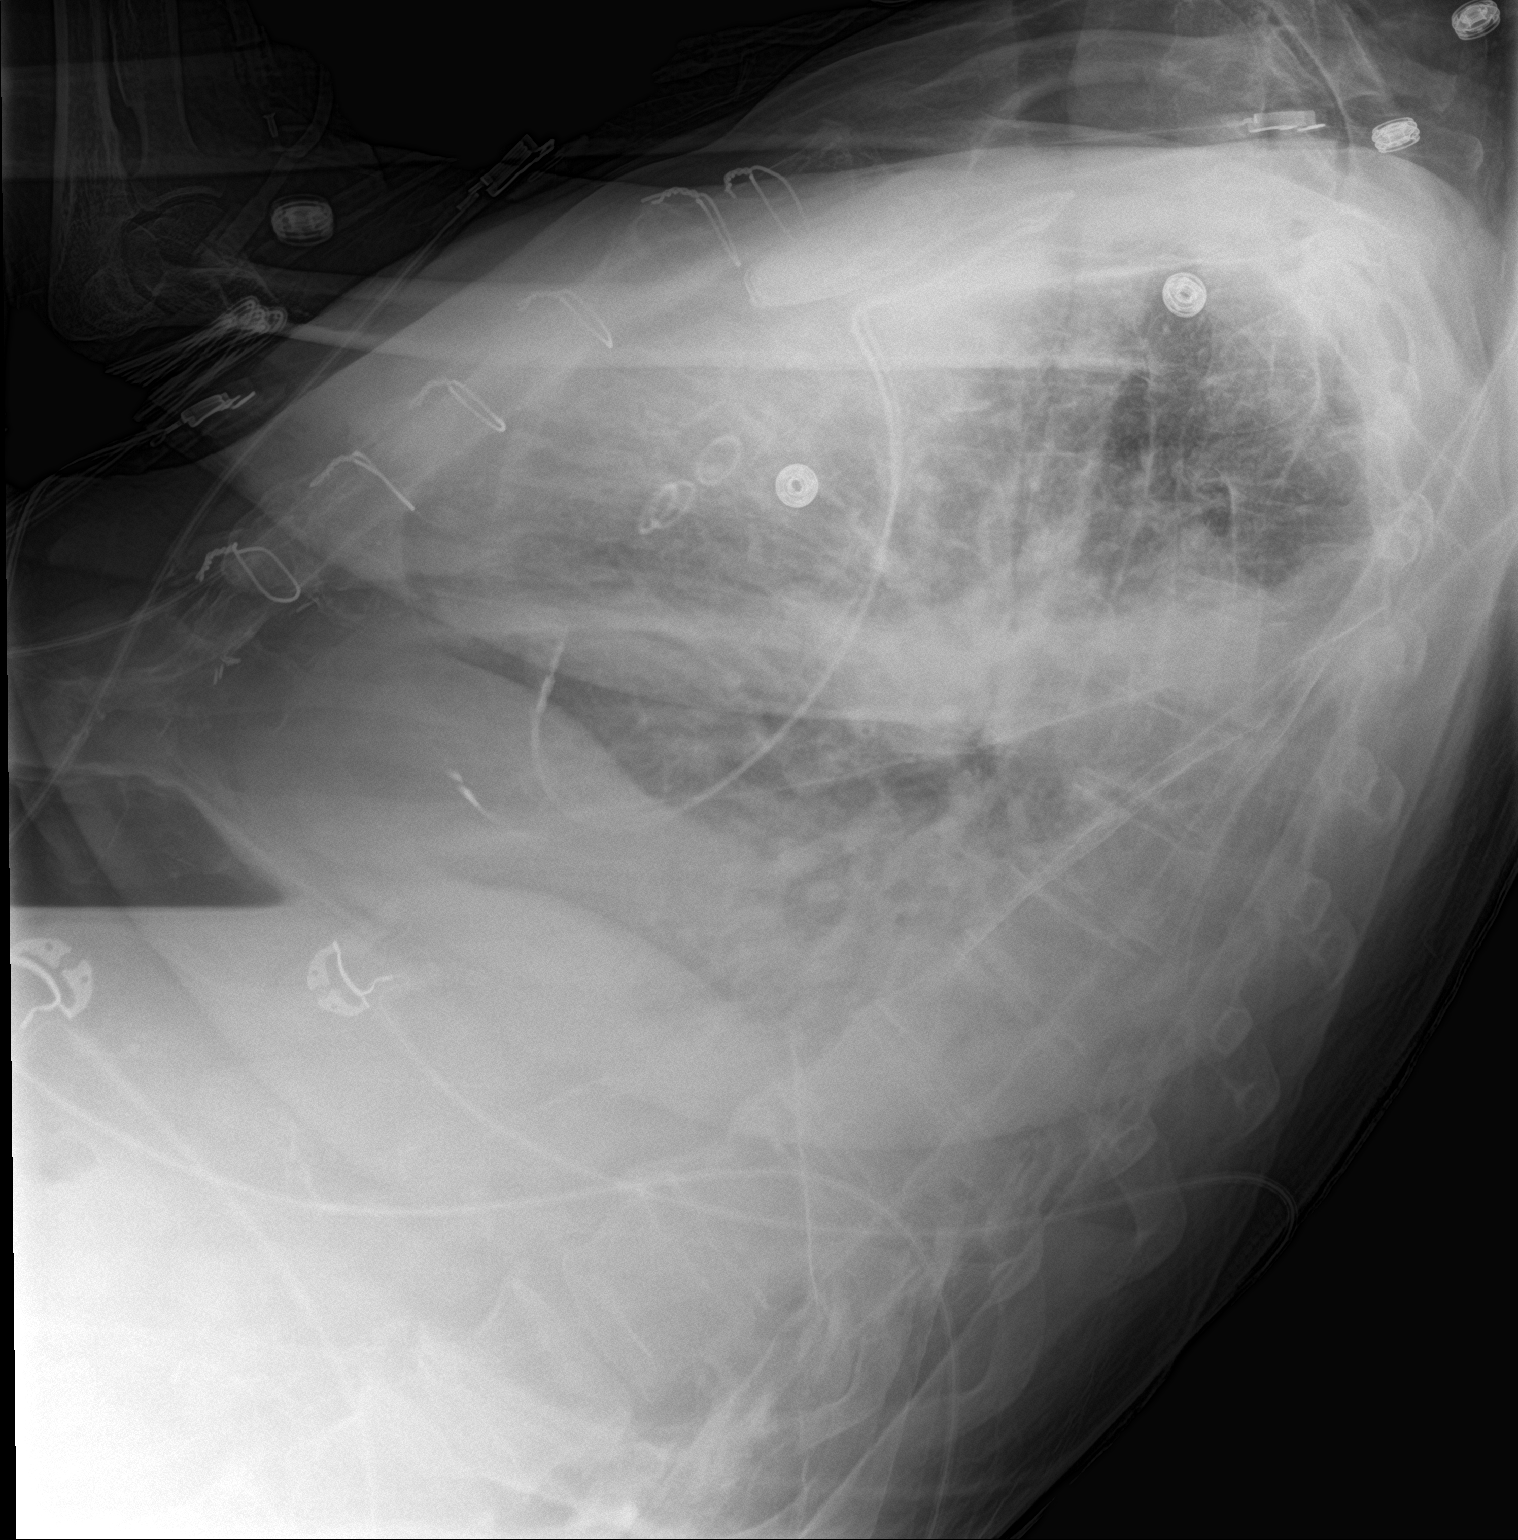

[chest ap]
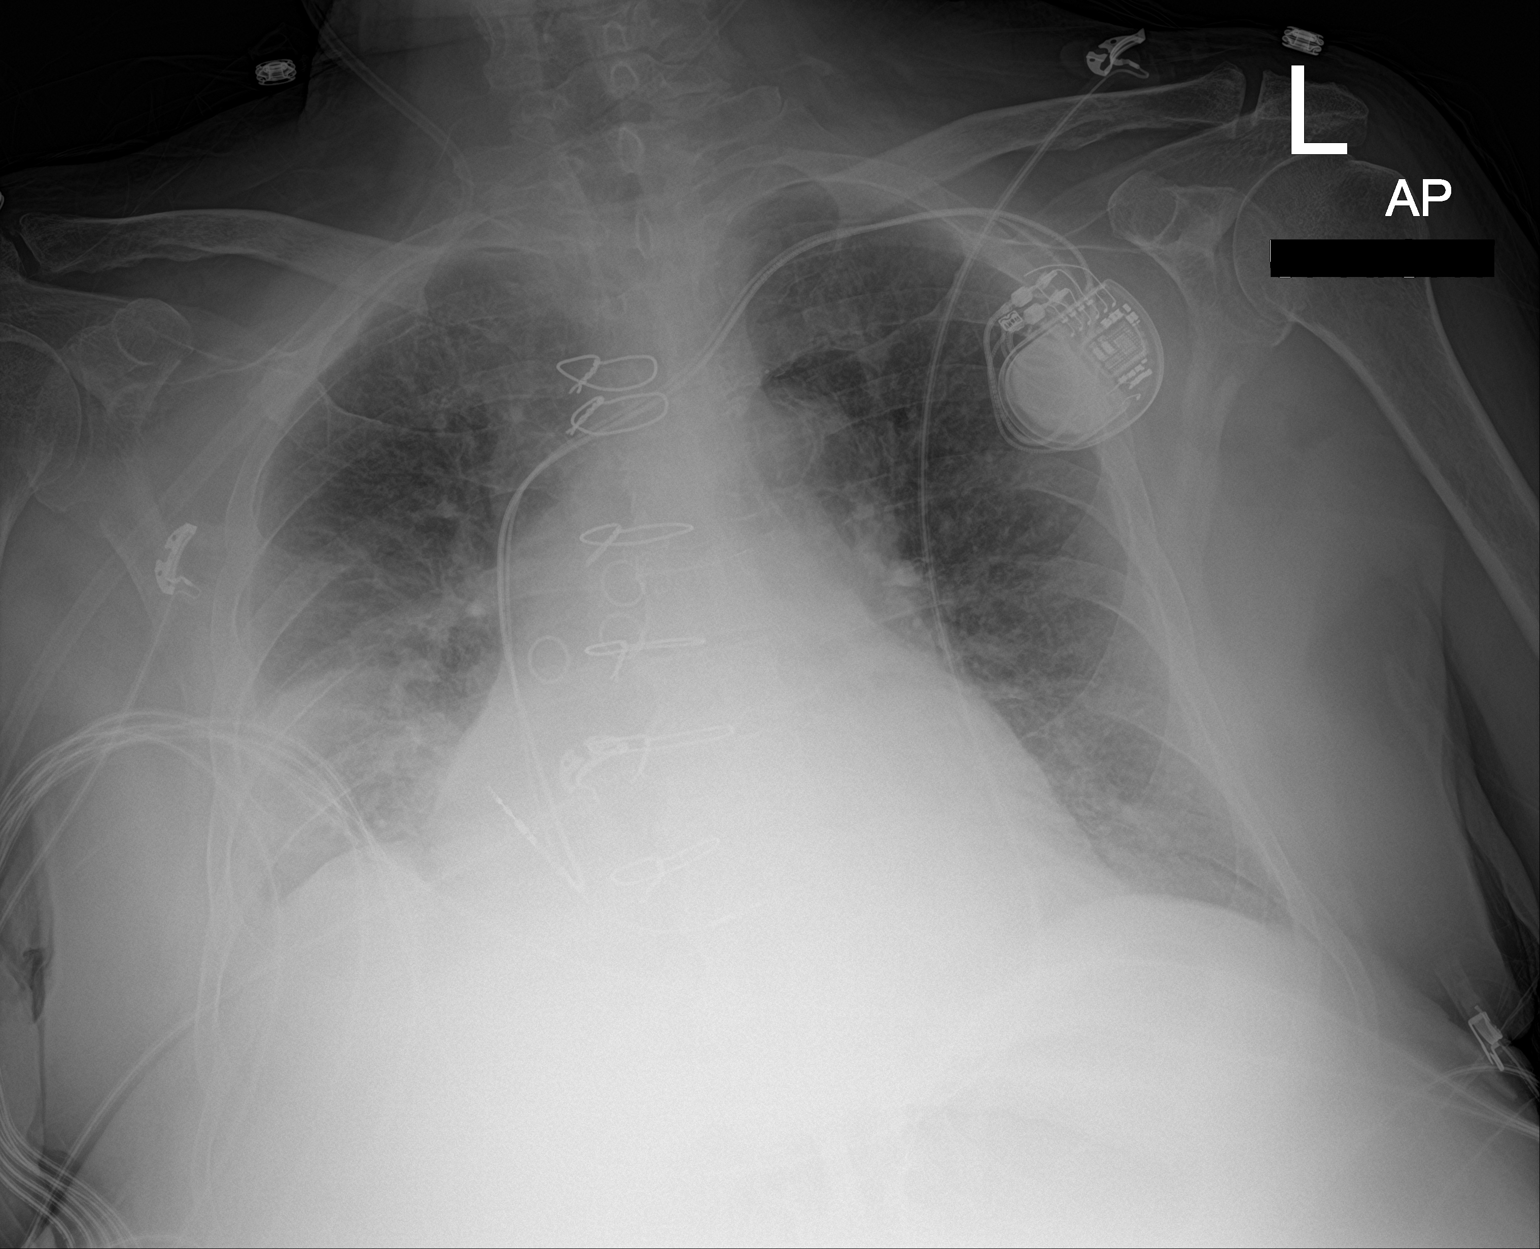

[2 of 2 positions shown; findings below may reference images not displayed]

FINDINGS: The heart remains mildly enlarged. Left subclavian pacemaker device
and leads are stable and intact. Postoperative changes from CABG and
sternotomy are stable. Diffuse interstitial infiltrates are stable.
Right pleural effusion improved. Hazy airspace disease in the right
mid and lower lung is nonspecific.
IMPRESSION: Stable diffuse interstitial infiltrates worrisome for edema

Improved right pleural effusion.

Hazy right mid and lower airspace disease.

## 2018-06-11 IMAGING — DX DG CHEST 1V PORT
1 series · 1 of 1 positions shown · non-contrast
Comparison: Chest radiograph dated 03/03/2017

CLINICAL DATA: 85-year-old male with central line placement for

EXAM:
PORTABLE CHEST 1 VIEW

[chest ap]
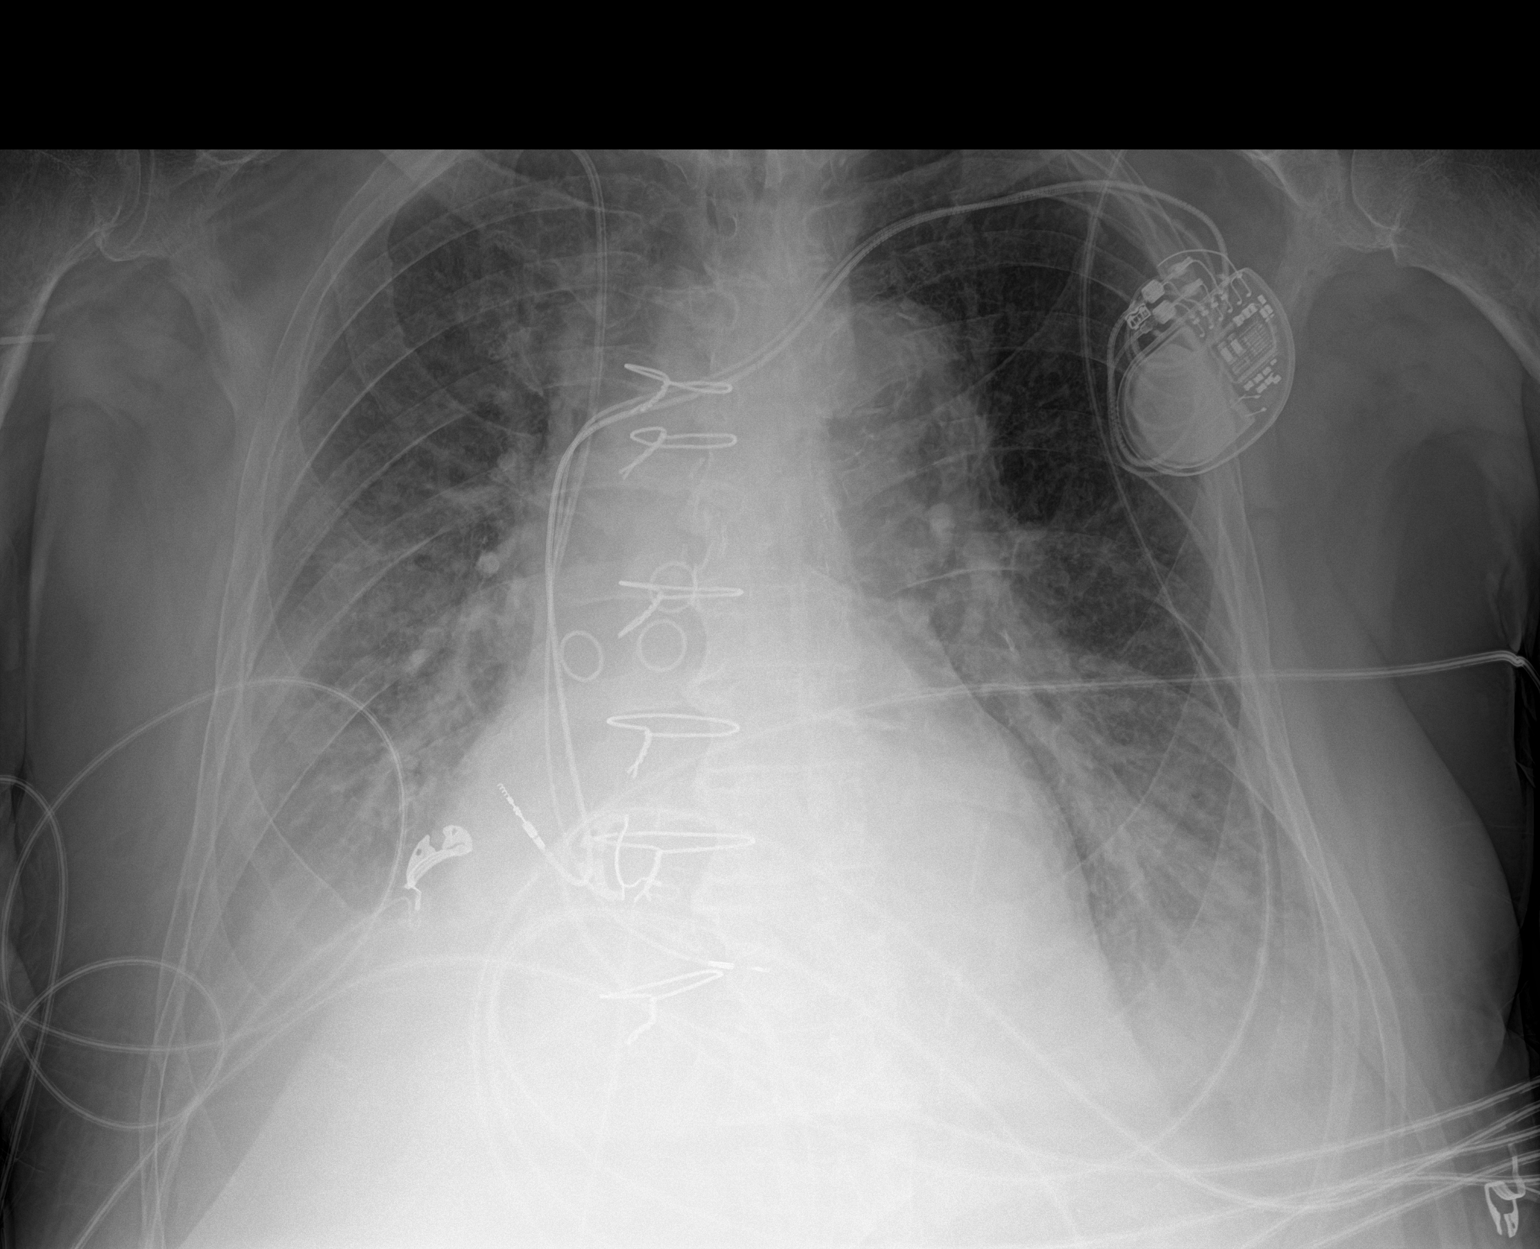

[1 of 1 positions shown; findings below may reference images not displayed]

FINDINGS: There has been interval placement of a right IJ central line with
tip over central SVC. No pneumothorax. Bilateral pleural effusions
and associated atelectatic changes versus infiltrate noted. There is
cardiomegaly with mild interstitial edema. Median sternotomy wires,
CABG vascular clips, and left pectoral pacemaker device. No acute
osseous pathology.
IMPRESSION: 1. Interval placement of a right IJ central line the tip over
central SVC. No pneumothorax.
2. Bilateral pleural effusions and a associated atelectasis versus
infiltrate. The pleural effusions appear slightly larger compared to
the earlier radiograph.
3. Cardiomegaly with mild pulmonary edema.

## 2018-06-13 IMAGING — CR DG CHEST 1V PORT
1 series · 1 of 1 positions shown · non-contrast
Comparison: Portable chest x-ray March 03, 2017

CLINICAL DATA: Status post left internal jugular catheter
placement.

EXAM:
PORTABLE CHEST 1 VIEW

[AP]
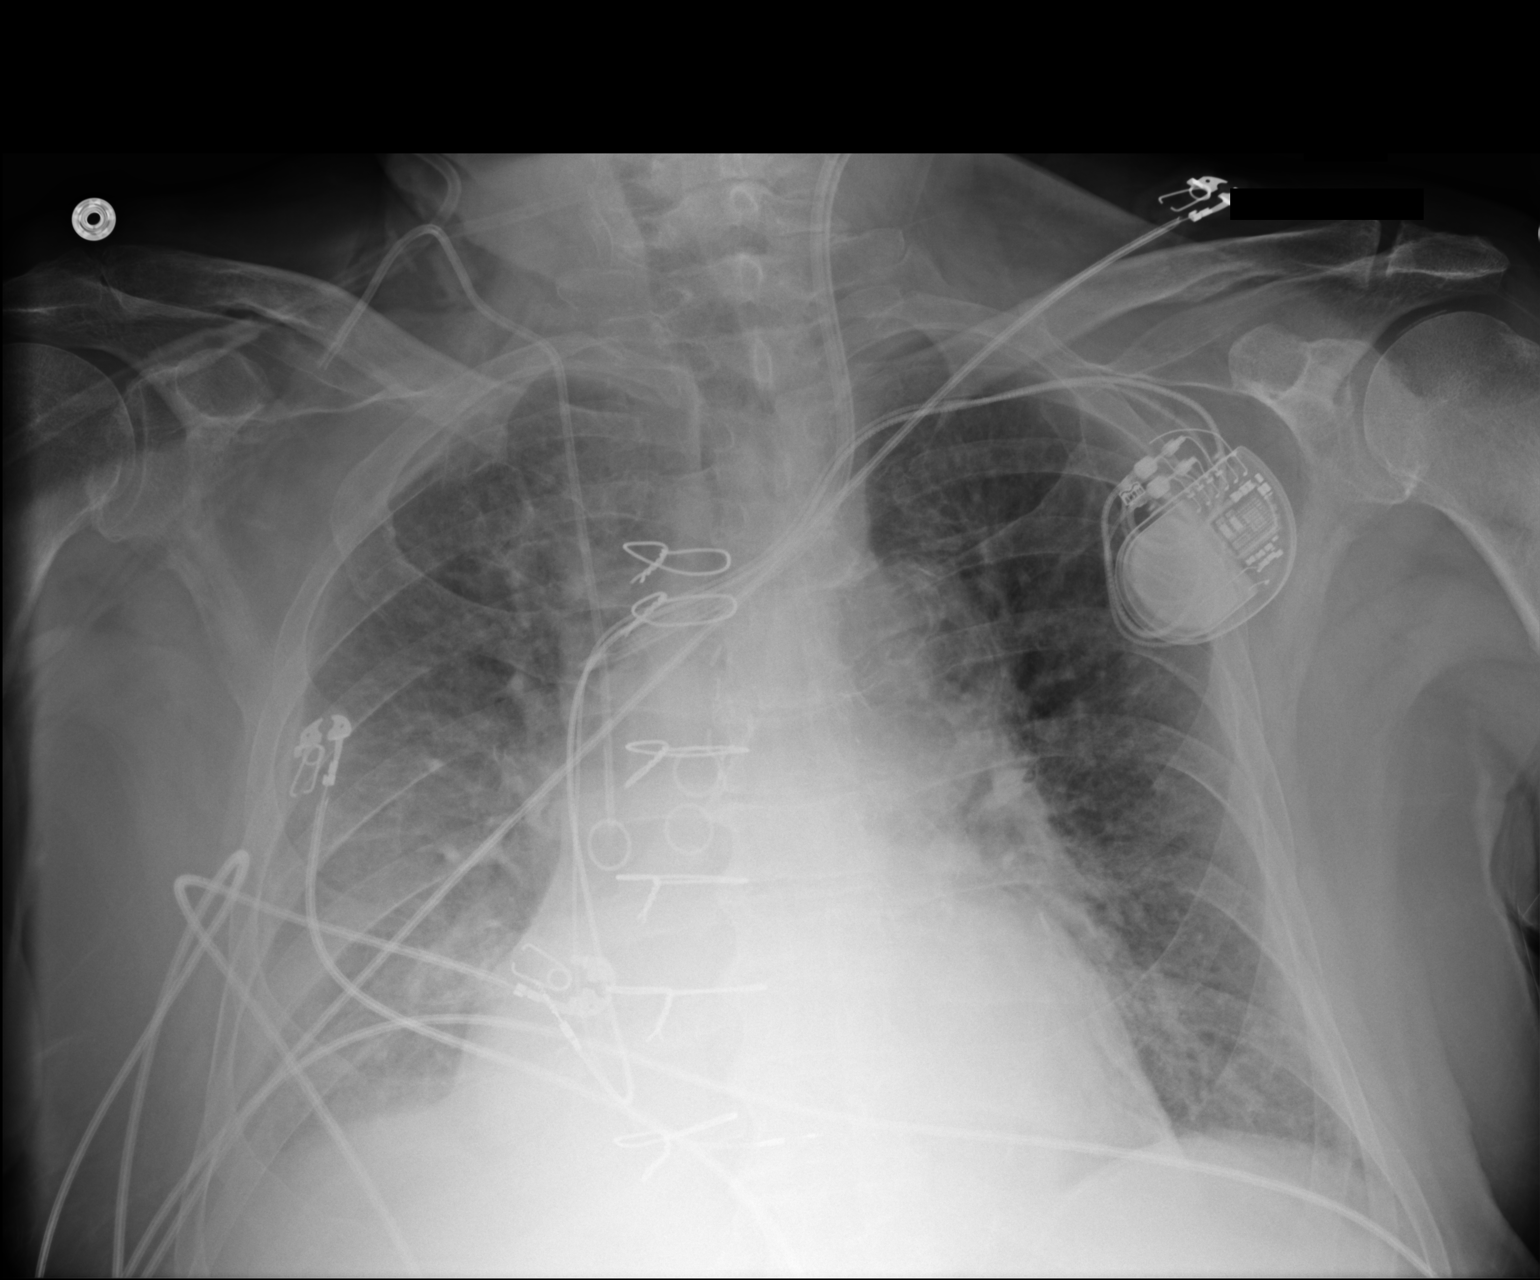

[1 of 1 positions shown; findings below may reference images not displayed]

FINDINGS: A dual-lumen left IJ catheter is been placed. The tip projects at
the junction of the right and left brachiocephalic vein. The right
IJ catheter is stable with its tip projecting over the midportion of
the SVC. There is no pneumothorax or significant pleural effusion on
the left. There is a small pleural effusion on the right. The
cardiac silhouette remains enlarged. The pulmonary vascularity is
slightly less engorged today. There is calcification in the wall of
the aortic arch. The sternal wires are intact. The ICD is in stable
position.
IMPRESSION: No postprocedure complication following left IJV catheter placement.

Slight interval improvement in CHF. Persistent right pleural
effusion layering posteriorly.

Thoracic aortic atherosclerosis.

## 2019-08-18 IMAGING — US US RENAL
1 series · 14 of 25 positions shown · non-contrast
Comparison: CTA of the abdomen and pelvis performed 07/19/2005, and
renal ultrasound performed 10/04/2006

CLINICAL DATA: Acute onset of renal insufficiency.

EXAM:
RENAL / URINARY TRACT ULTRASOUND COMPLETE

[Series 1: us renal · 0.31mm/px · 14 of 27 slices shown]
[im 1/27]
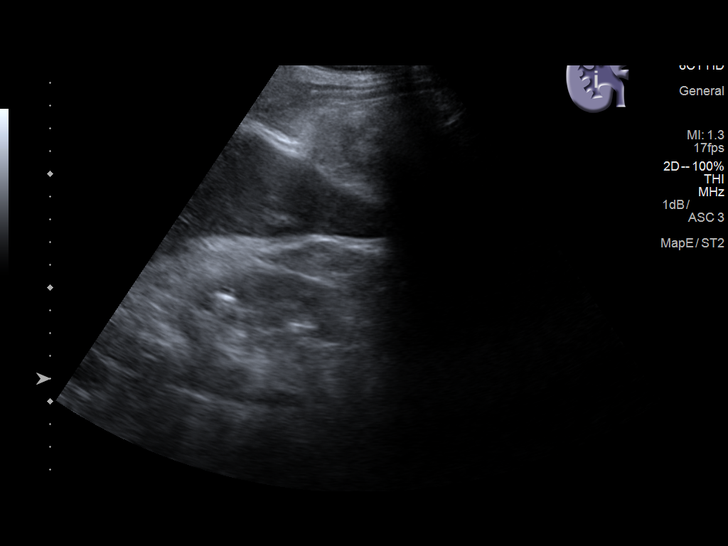
[im 3/27]
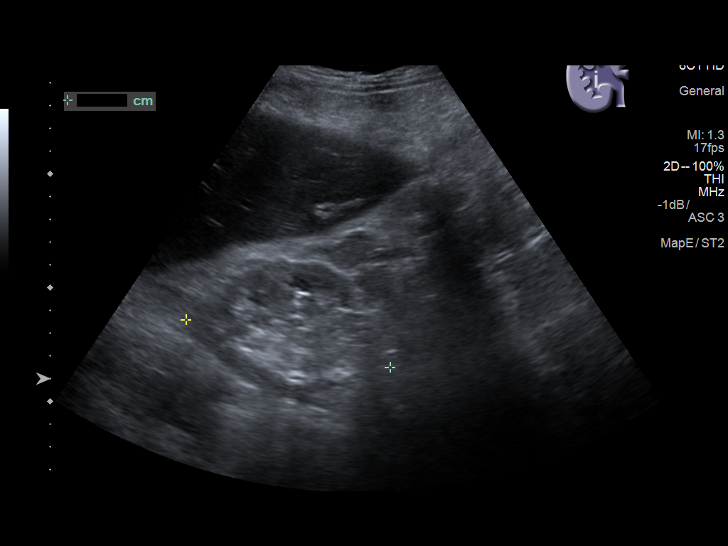
[im 5/27]
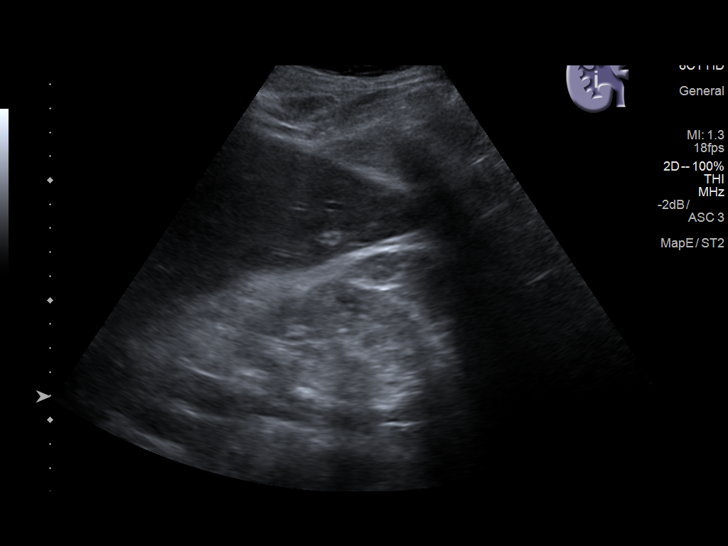
[im 7/27]
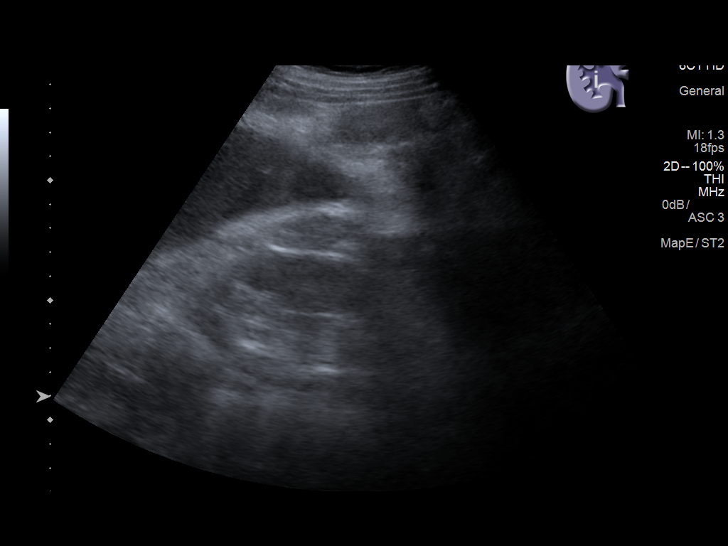
[im 9/27]
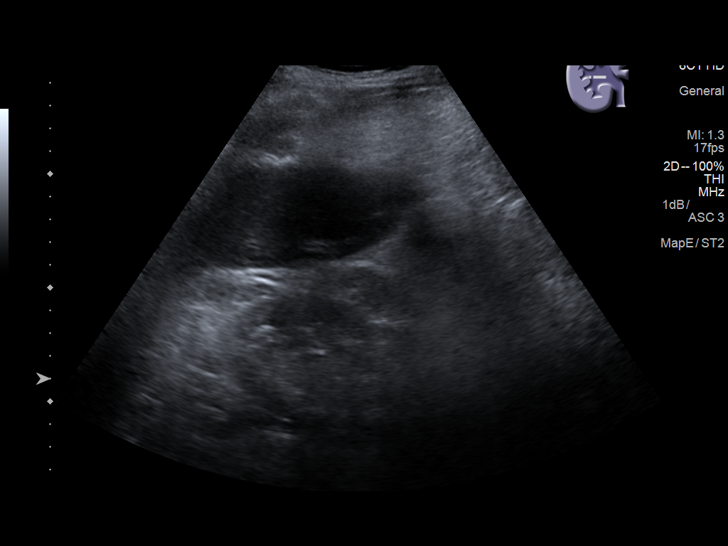
[im 10/27]
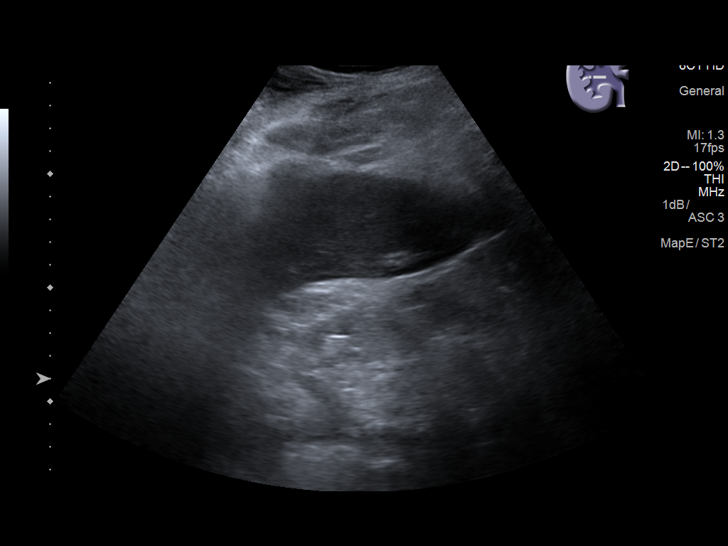
[im 12/27]
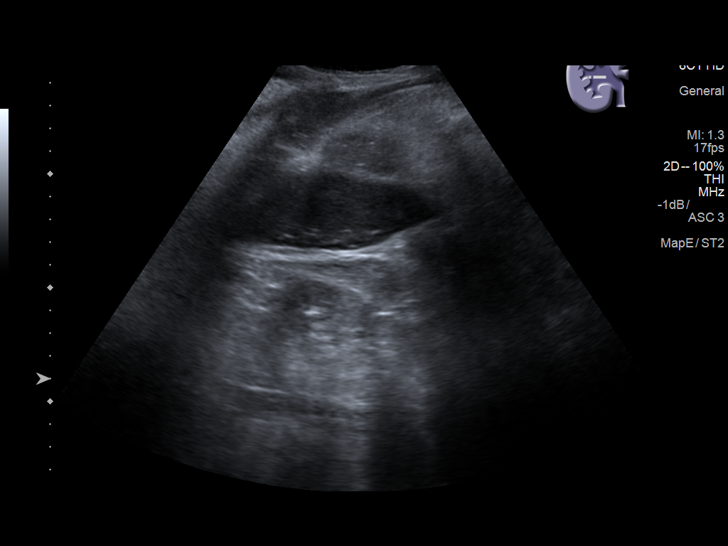
[im 15/27]
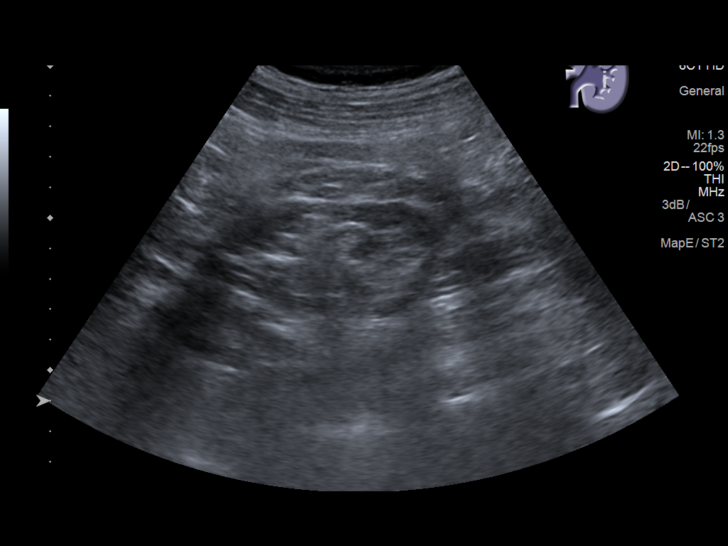
[im 17/27]
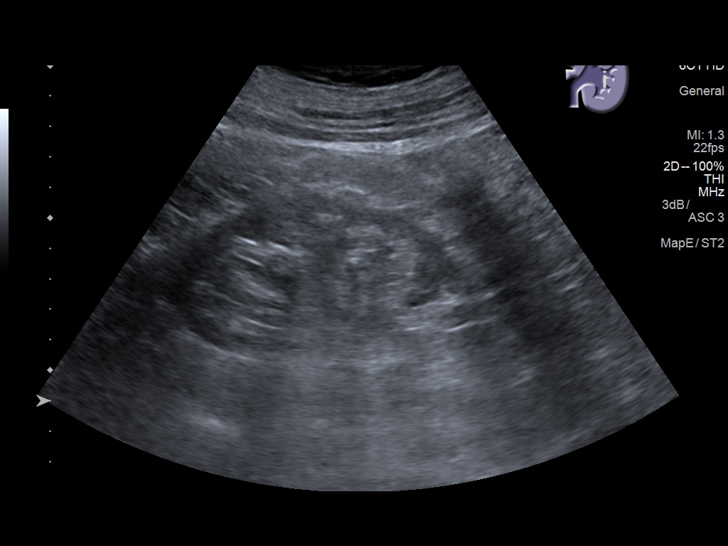
[im 18/27]
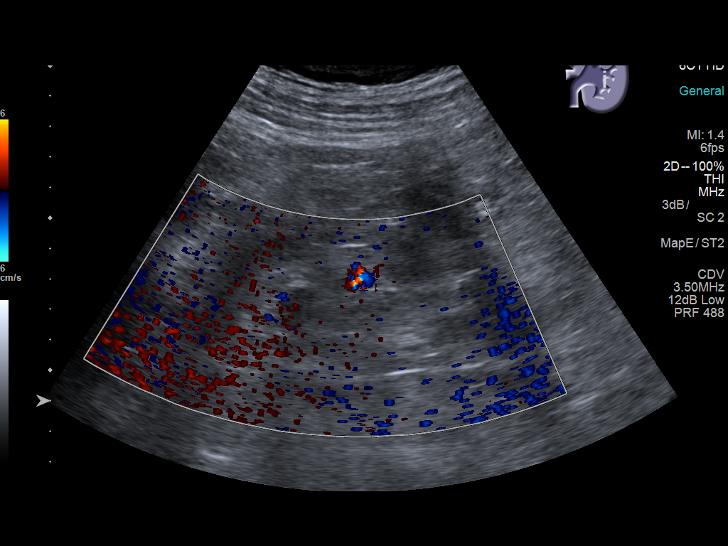
[im 20/27]
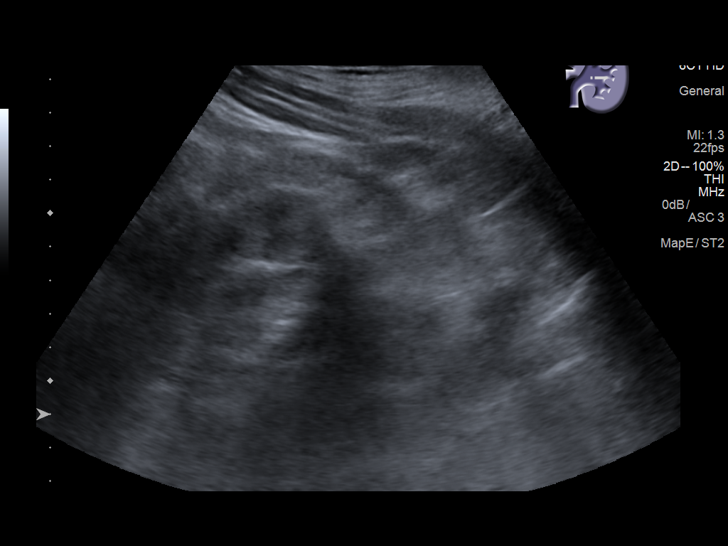
[im 22/27]
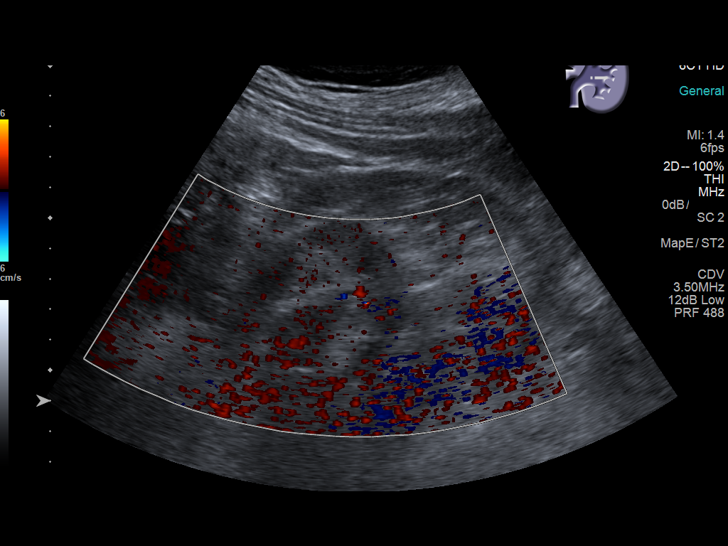
[im 24/27]
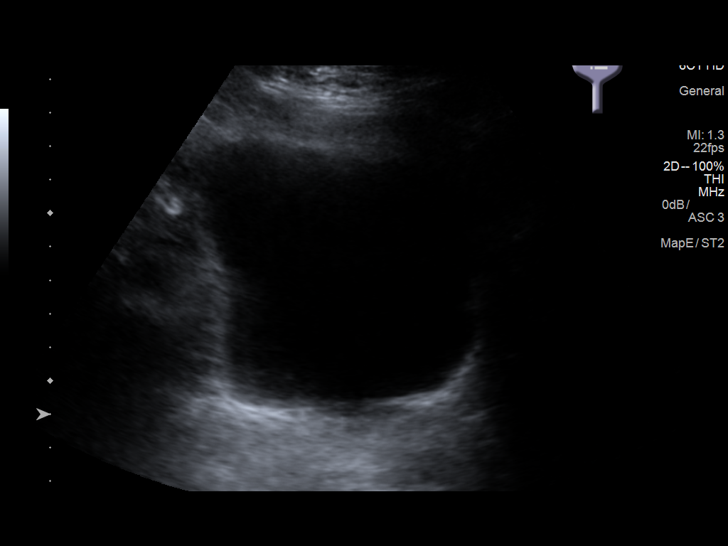
[im 27/27]
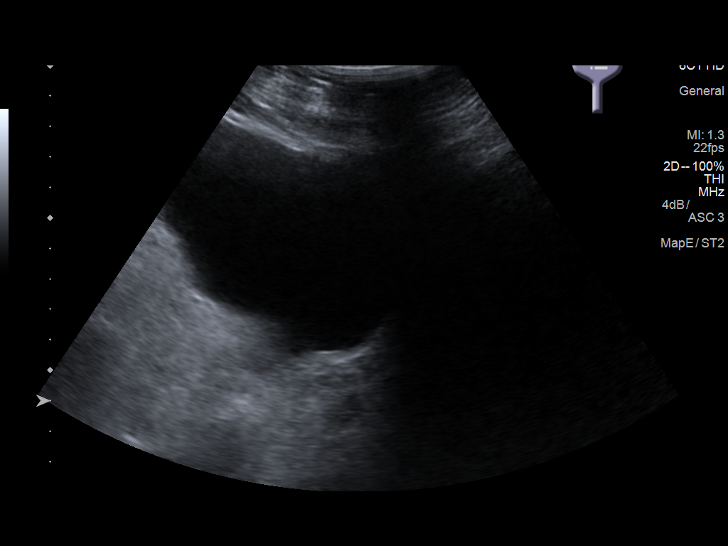

[14 of 25 positions shown; findings below may reference images not displayed]

FINDINGS: Right Kidney:

Length: 9.7 cm. Diffusely increased parenchymal echogenicity is
noted. Renal cortical thinning is seen. No mass or hydronephrosis
visualized.

Left Kidney:

Length: 10.1 cm. Diffusely increased parenchymal echogenicity is
noted. Renal cortical thinning is seen. No mass or hydronephrosis
visualized.

Bladder:

Appears normal for degree of bladder distention.
IMPRESSION: 1. No evidence of hydronephrosis.
2. Diffusely increased parenchymal echogenicity raises concern for
medical renal disease.
3. Renal cortical thinning likely reflects chronic renal disease.
